# Patient Record
Sex: Female | Born: 1935 | Race: White | Hispanic: No | Smoking: Never smoker
Health system: Southern US, Community
[De-identification: ages and names within clinical notes are randomized; demographics above are authoritative.]

## PROBLEM LIST (undated history)

## (undated) DIAGNOSIS — Z961 Presence of intraocular lens: Secondary | ICD-10-CM

## (undated) DIAGNOSIS — M1990 Unspecified osteoarthritis, unspecified site: Secondary | ICD-10-CM

## (undated) DIAGNOSIS — I517 Cardiomegaly: Secondary | ICD-10-CM

## (undated) DIAGNOSIS — F419 Anxiety disorder, unspecified: Secondary | ICD-10-CM

## (undated) DIAGNOSIS — R079 Chest pain, unspecified: Secondary | ICD-10-CM

## (undated) DIAGNOSIS — I10 Essential (primary) hypertension: Secondary | ICD-10-CM

## (undated) DIAGNOSIS — N393 Stress incontinence (female) (male): Secondary | ICD-10-CM

## (undated) DIAGNOSIS — K2970 Gastritis, unspecified, without bleeding: Secondary | ICD-10-CM

## (undated) DIAGNOSIS — F329 Major depressive disorder, single episode, unspecified: Secondary | ICD-10-CM

## (undated) DIAGNOSIS — K219 Gastro-esophageal reflux disease without esophagitis: Secondary | ICD-10-CM

## (undated) DIAGNOSIS — I351 Nonrheumatic aortic (valve) insufficiency: Secondary | ICD-10-CM

## (undated) DIAGNOSIS — I4891 Unspecified atrial fibrillation: Secondary | ICD-10-CM

## (undated) DIAGNOSIS — E039 Hypothyroidism, unspecified: Secondary | ICD-10-CM

## (undated) DIAGNOSIS — M810 Age-related osteoporosis without current pathological fracture: Secondary | ICD-10-CM

## (undated) DIAGNOSIS — K279 Peptic ulcer, site unspecified, unspecified as acute or chronic, without hemorrhage or perforation: Secondary | ICD-10-CM

## (undated) DIAGNOSIS — Z950 Presence of cardiac pacemaker: Secondary | ICD-10-CM

## (undated) HISTORY — DX: Chest pain, unspecified: R07.9

## (undated) HISTORY — PX: BLADDER SURGERY: SHX569

## (undated) HISTORY — PX: LEG SURGERY: SHX1003

## (undated) HISTORY — PX: DG  BONE DENSITY (ARMC HX): HXRAD1102

## (undated) HISTORY — PX: ABDOMINAL SURGERY: SHX537

## (undated) HISTORY — PX: EYE SURGERY: SHX253

## (undated) HISTORY — DX: Presence of cardiac pacemaker: Z95.0

## (undated) HISTORY — PX: KNEE ARTHROPLASTY: SHX992

---

## 2014-12-15 DIAGNOSIS — D3131 Benign neoplasm of right choroid: Secondary | ICD-10-CM | POA: Diagnosis not present

## 2015-02-24 DIAGNOSIS — M19049 Primary osteoarthritis, unspecified hand: Secondary | ICD-10-CM | POA: Diagnosis not present

## 2015-02-24 DIAGNOSIS — M810 Age-related osteoporosis without current pathological fracture: Secondary | ICD-10-CM | POA: Diagnosis not present

## 2015-02-24 DIAGNOSIS — M179 Osteoarthritis of knee, unspecified: Secondary | ICD-10-CM | POA: Diagnosis not present

## 2015-02-24 DIAGNOSIS — Z791 Long term (current) use of non-steroidal anti-inflammatories (NSAID): Secondary | ICD-10-CM | POA: Diagnosis not present

## 2015-03-10 DIAGNOSIS — N390 Urinary tract infection, site not specified: Secondary | ICD-10-CM | POA: Diagnosis not present

## 2015-03-10 DIAGNOSIS — F419 Anxiety disorder, unspecified: Secondary | ICD-10-CM | POA: Diagnosis not present

## 2015-03-10 DIAGNOSIS — M810 Age-related osteoporosis without current pathological fracture: Secondary | ICD-10-CM | POA: Diagnosis not present

## 2015-03-10 DIAGNOSIS — Z791 Long term (current) use of non-steroidal anti-inflammatories (NSAID): Secondary | ICD-10-CM | POA: Diagnosis not present

## 2015-04-15 DIAGNOSIS — M818 Other osteoporosis without current pathological fracture: Secondary | ICD-10-CM | POA: Diagnosis not present

## 2015-04-16 DIAGNOSIS — N390 Urinary tract infection, site not specified: Secondary | ICD-10-CM | POA: Diagnosis not present

## 2015-06-04 DIAGNOSIS — N390 Urinary tract infection, site not specified: Secondary | ICD-10-CM | POA: Diagnosis not present

## 2015-06-04 DIAGNOSIS — M1990 Unspecified osteoarthritis, unspecified site: Secondary | ICD-10-CM | POA: Diagnosis not present

## 2015-06-04 DIAGNOSIS — N3946 Mixed incontinence: Secondary | ICD-10-CM | POA: Diagnosis not present

## 2015-06-04 DIAGNOSIS — F419 Anxiety disorder, unspecified: Secondary | ICD-10-CM | POA: Diagnosis not present

## 2015-06-11 DIAGNOSIS — H169 Unspecified keratitis: Secondary | ICD-10-CM | POA: Diagnosis not present

## 2015-06-11 DIAGNOSIS — H3561 Retinal hemorrhage, right eye: Secondary | ICD-10-CM | POA: Diagnosis not present

## 2015-06-11 DIAGNOSIS — D3131 Benign neoplasm of right choroid: Secondary | ICD-10-CM | POA: Diagnosis not present

## 2015-06-16 DIAGNOSIS — I10 Essential (primary) hypertension: Secondary | ICD-10-CM | POA: Diagnosis not present

## 2015-06-16 DIAGNOSIS — N3941 Urge incontinence: Secondary | ICD-10-CM | POA: Diagnosis not present

## 2015-06-16 DIAGNOSIS — N281 Cyst of kidney, acquired: Secondary | ICD-10-CM | POA: Diagnosis not present

## 2015-06-16 DIAGNOSIS — Z6830 Body mass index (BMI) 30.0-30.9, adult: Secondary | ICD-10-CM | POA: Diagnosis not present

## 2015-06-16 DIAGNOSIS — M810 Age-related osteoporosis without current pathological fracture: Secondary | ICD-10-CM | POA: Diagnosis not present

## 2015-06-16 DIAGNOSIS — R350 Frequency of micturition: Secondary | ICD-10-CM | POA: Diagnosis not present

## 2015-06-19 DIAGNOSIS — R102 Pelvic and perineal pain: Secondary | ICD-10-CM | POA: Diagnosis not present

## 2015-06-19 DIAGNOSIS — N858 Other specified noninflammatory disorders of uterus: Secondary | ICD-10-CM | POA: Diagnosis not present

## 2015-07-10 DIAGNOSIS — Z6830 Body mass index (BMI) 30.0-30.9, adult: Secondary | ICD-10-CM | POA: Diagnosis not present

## 2015-07-10 DIAGNOSIS — N3941 Urge incontinence: Secondary | ICD-10-CM | POA: Diagnosis not present

## 2015-07-10 DIAGNOSIS — N281 Cyst of kidney, acquired: Secondary | ICD-10-CM | POA: Diagnosis not present

## 2015-07-10 DIAGNOSIS — Z79899 Other long term (current) drug therapy: Secondary | ICD-10-CM | POA: Diagnosis not present

## 2015-07-10 DIAGNOSIS — N390 Urinary tract infection, site not specified: Secondary | ICD-10-CM | POA: Diagnosis not present

## 2015-07-10 DIAGNOSIS — M810 Age-related osteoporosis without current pathological fracture: Secondary | ICD-10-CM | POA: Diagnosis not present

## 2015-07-10 DIAGNOSIS — I10 Essential (primary) hypertension: Secondary | ICD-10-CM | POA: Diagnosis not present

## 2015-07-10 DIAGNOSIS — R350 Frequency of micturition: Secondary | ICD-10-CM | POA: Diagnosis not present

## 2015-07-21 DIAGNOSIS — N3941 Urge incontinence: Secondary | ICD-10-CM | POA: Diagnosis not present

## 2015-07-21 DIAGNOSIS — Z6830 Body mass index (BMI) 30.0-30.9, adult: Secondary | ICD-10-CM | POA: Diagnosis not present

## 2015-08-27 DIAGNOSIS — M179 Osteoarthritis of knee, unspecified: Secondary | ICD-10-CM | POA: Diagnosis not present

## 2015-08-27 DIAGNOSIS — M19049 Primary osteoarthritis, unspecified hand: Secondary | ICD-10-CM | POA: Diagnosis not present

## 2015-08-27 DIAGNOSIS — Z791 Long term (current) use of non-steroidal anti-inflammatories (NSAID): Secondary | ICD-10-CM | POA: Diagnosis not present

## 2015-09-16 DIAGNOSIS — M79672 Pain in left foot: Secondary | ICD-10-CM | POA: Diagnosis not present

## 2015-09-16 DIAGNOSIS — L603 Nail dystrophy: Secondary | ICD-10-CM | POA: Diagnosis not present

## 2015-09-16 DIAGNOSIS — L600 Ingrowing nail: Secondary | ICD-10-CM | POA: Diagnosis not present

## 2015-09-18 DIAGNOSIS — M79672 Pain in left foot: Secondary | ICD-10-CM | POA: Diagnosis not present

## 2015-09-18 DIAGNOSIS — L600 Ingrowing nail: Secondary | ICD-10-CM | POA: Diagnosis not present

## 2015-10-01 DIAGNOSIS — M79609 Pain in unspecified limb: Secondary | ICD-10-CM | POA: Diagnosis not present

## 2015-10-01 DIAGNOSIS — L03031 Cellulitis of right toe: Secondary | ICD-10-CM | POA: Diagnosis not present

## 2015-10-01 DIAGNOSIS — L03032 Cellulitis of left toe: Secondary | ICD-10-CM | POA: Diagnosis not present

## 2015-10-01 DIAGNOSIS — S99922A Unspecified injury of left foot, initial encounter: Secondary | ICD-10-CM | POA: Diagnosis not present

## 2015-10-09 DIAGNOSIS — M1990 Unspecified osteoarthritis, unspecified site: Secondary | ICD-10-CM | POA: Diagnosis not present

## 2015-10-09 DIAGNOSIS — R300 Dysuria: Secondary | ICD-10-CM | POA: Diagnosis not present

## 2015-10-09 DIAGNOSIS — L03119 Cellulitis of unspecified part of limb: Secondary | ICD-10-CM | POA: Diagnosis not present

## 2015-12-02 DIAGNOSIS — N3000 Acute cystitis without hematuria: Secondary | ICD-10-CM | POA: Diagnosis not present

## 2016-01-15 DIAGNOSIS — H1859 Other hereditary corneal dystrophies: Secondary | ICD-10-CM | POA: Diagnosis not present

## 2016-01-15 DIAGNOSIS — T86841 Corneal transplant failure: Secondary | ICD-10-CM | POA: Diagnosis not present

## 2016-01-15 DIAGNOSIS — B0052 Herpesviral keratitis: Secondary | ICD-10-CM | POA: Diagnosis not present

## 2016-01-15 DIAGNOSIS — D3131 Benign neoplasm of right choroid: Secondary | ICD-10-CM | POA: Diagnosis not present

## 2016-01-15 DIAGNOSIS — Z947 Corneal transplant status: Secondary | ICD-10-CM | POA: Diagnosis not present

## 2016-01-19 DIAGNOSIS — R197 Diarrhea, unspecified: Secondary | ICD-10-CM | POA: Diagnosis not present

## 2016-01-19 DIAGNOSIS — N390 Urinary tract infection, site not specified: Secondary | ICD-10-CM | POA: Diagnosis not present

## 2016-01-19 DIAGNOSIS — R112 Nausea with vomiting, unspecified: Secondary | ICD-10-CM | POA: Diagnosis not present

## 2016-02-05 DIAGNOSIS — K5900 Constipation, unspecified: Secondary | ICD-10-CM | POA: Diagnosis not present

## 2016-02-05 DIAGNOSIS — H6123 Impacted cerumen, bilateral: Secondary | ICD-10-CM | POA: Diagnosis not present

## 2016-02-05 DIAGNOSIS — R109 Unspecified abdominal pain: Secondary | ICD-10-CM | POA: Diagnosis not present

## 2016-02-05 DIAGNOSIS — Z1231 Encounter for screening mammogram for malignant neoplasm of breast: Secondary | ICD-10-CM | POA: Diagnosis not present

## 2016-02-05 DIAGNOSIS — M1612 Unilateral primary osteoarthritis, left hip: Secondary | ICD-10-CM | POA: Diagnosis not present

## 2016-02-05 DIAGNOSIS — E039 Hypothyroidism, unspecified: Secondary | ICD-10-CM | POA: Diagnosis not present

## 2016-02-05 DIAGNOSIS — I10 Essential (primary) hypertension: Secondary | ICD-10-CM | POA: Diagnosis not present

## 2016-02-05 DIAGNOSIS — R300 Dysuria: Secondary | ICD-10-CM | POA: Diagnosis not present

## 2016-02-05 DIAGNOSIS — M5136 Other intervertebral disc degeneration, lumbar region: Secondary | ICD-10-CM | POA: Diagnosis not present

## 2016-03-01 DIAGNOSIS — R399 Unspecified symptoms and signs involving the genitourinary system: Secondary | ICD-10-CM | POA: Diagnosis not present

## 2016-03-01 DIAGNOSIS — N3941 Urge incontinence: Secondary | ICD-10-CM | POA: Diagnosis not present

## 2016-03-01 DIAGNOSIS — Z6831 Body mass index (BMI) 31.0-31.9, adult: Secondary | ICD-10-CM | POA: Diagnosis not present

## 2016-03-02 DIAGNOSIS — M1711 Unilateral primary osteoarthritis, right knee: Secondary | ICD-10-CM | POA: Diagnosis not present

## 2016-03-02 DIAGNOSIS — M19049 Primary osteoarthritis, unspecified hand: Secondary | ICD-10-CM | POA: Diagnosis not present

## 2016-03-02 DIAGNOSIS — E559 Vitamin D deficiency, unspecified: Secondary | ICD-10-CM | POA: Diagnosis not present

## 2016-03-02 DIAGNOSIS — Z7983 Long term (current) use of bisphosphonates: Secondary | ICD-10-CM | POA: Diagnosis not present

## 2016-04-08 DIAGNOSIS — N3941 Urge incontinence: Secondary | ICD-10-CM | POA: Diagnosis not present

## 2016-06-20 ENCOUNTER — Encounter (HOSPITAL_COMMUNITY): Payer: Self-pay | Admitting: Emergency Medicine

## 2016-06-20 ENCOUNTER — Ambulatory Visit (HOSPITAL_COMMUNITY)
Admission: EM | Admit: 2016-06-20 | Discharge: 2016-06-20 | Disposition: A | Discharge status: Home / Self Care | Payer: Medicare Other | Attending: Emergency Medicine | Admitting: Emergency Medicine

## 2016-06-20 DIAGNOSIS — R8290 Unspecified abnormal findings in urine: Secondary | ICD-10-CM | POA: Insufficient documentation

## 2016-06-20 DIAGNOSIS — N390 Urinary tract infection, site not specified: Secondary | ICD-10-CM | POA: Diagnosis present

## 2016-06-20 DIAGNOSIS — R300 Dysuria: Secondary | ICD-10-CM | POA: Diagnosis not present

## 2016-06-20 DIAGNOSIS — Z87440 Personal history of urinary (tract) infections: Secondary | ICD-10-CM | POA: Insufficient documentation

## 2016-06-20 DIAGNOSIS — I10 Essential (primary) hypertension: Secondary | ICD-10-CM | POA: Insufficient documentation

## 2016-06-20 DIAGNOSIS — M1990 Unspecified osteoarthritis, unspecified site: Secondary | ICD-10-CM | POA: Diagnosis not present

## 2016-06-20 DIAGNOSIS — Z79899 Other long term (current) drug therapy: Secondary | ICD-10-CM | POA: Diagnosis not present

## 2016-06-20 DIAGNOSIS — R3915 Urgency of urination: Secondary | ICD-10-CM | POA: Insufficient documentation

## 2016-06-20 HISTORY — DX: Presence of intraocular lens: Z96.1

## 2016-06-20 HISTORY — DX: Unspecified osteoarthritis, unspecified site: M19.90

## 2016-06-20 HISTORY — DX: Age-related osteoporosis without current pathological fracture: M81.0

## 2016-06-20 HISTORY — DX: Hypothyroidism, unspecified: E03.9

## 2016-06-20 HISTORY — DX: Gastro-esophageal reflux disease without esophagitis: K21.9

## 2016-06-20 HISTORY — DX: Stress incontinence (female) (male): N39.3

## 2016-06-20 HISTORY — DX: Major depressive disorder, single episode, unspecified: F32.9

## 2016-06-20 HISTORY — DX: Peptic ulcer, site unspecified, unspecified as acute or chronic, without hemorrhage or perforation: K27.9

## 2016-06-20 HISTORY — DX: Gastritis, unspecified, without bleeding: K29.70

## 2016-06-20 HISTORY — DX: Essential (primary) hypertension: I10

## 2016-06-20 HISTORY — DX: Cardiomegaly: I51.7

## 2016-06-20 HISTORY — DX: Nonrheumatic aortic (valve) insufficiency: I35.1

## 2016-06-20 HISTORY — DX: Anxiety disorder, unspecified: F41.9

## 2016-06-20 LAB — POCT URINALYSIS DIP (DEVICE)
BILIRUBIN URINE: NEGATIVE
Glucose, UA: NEGATIVE mg/dL
HGB URINE DIPSTICK: NEGATIVE
KETONES UR: NEGATIVE mg/dL
NITRITE: NEGATIVE
PH: 7 (ref 5.0–8.0)
PROTEIN: NEGATIVE mg/dL
Specific Gravity, Urine: 1.01 (ref 1.005–1.030)
Urobilinogen, UA: 0.2 mg/dL (ref 0.0–1.0)

## 2016-06-20 MED ORDER — CEFDINIR 300 MG PO CAPS: 300.0000 mg | ORAL_CAPSULE | Freq: Two times a day (BID) | ORAL | Status: DC

## 2016-06-20 NOTE — ED Notes (Addendum)
uti concerns, [facility]  Reports frequent urination, odor, color change to urine.  History of the same . Patient has many allergies and reports her utis are usually treated with cefdinir

## 2016-06-20 NOTE — ED Provider Notes (Signed)
CSN: [id number]     Arrival date & time 06/20/16  [id number] History   First MD Initiated Contact with Patient 06/20/16 1056     Chief Complaint  Patient presents with  . Urinary Tract Infection   (Consider location/radiation/quality/duration/timing/severity/associated sxs/prior Treatment) HPI Comments: 82 year old female presents to the urgent care with concerns of UTI. Her symptoms are malodorous urine and urinary frequency. Denies dysuria. Review systems otherwise negative. She is concerned because she states that she has UTIs approximately every 6 weeks or so she is allergic to several antibiotics and is only able to take Cefdinir. Denies fever, chills, back pain, GI symptoms.   Past Medical History  Diagnosis Date  . Hypertension   . Osteoarthritis   . Osteoporosis   . Major depression ([place])   . Anxiety   . GERD (gastroesophageal reflux disease)   . Left ventricular hypertrophy   . Aortic regurgitation   . Stress incontinence   . Hypothyroid   . Gastritis   . Peptic ulcer disease   . H/O artificial eye lens    History reviewed. No pertinent past surgical history. No family history on file. Social History  Substance Use Topics  . Smoking status: Never Smoker   . Smokeless tobacco: None  . Alcohol Use: No   OB History    No data available     Review of Systems  Constitutional: Negative.   HENT: Negative.   Respiratory: Negative.   Genitourinary: Positive for urgency and frequency. Negative for dysuria, vaginal bleeding and difficulty urinating.  Neurological: Negative.     Allergies  Ciprofloxacin; Levofloxacin; Macrobid; Nitrofurantoin; and Sulfa antibiotics  Home Medications   Prior to Admission medications   Medication Sig Start Date End Date Taking? Authorizing Provider  acyclovir (ZOVIRAX) 800 MG tablet Take 800 mg by mouth 5 (five) times daily.   Yes Historical Provider, MD  ALPRAZolam Duanne Moron) 0.5 MG tablet Take 0.5 mg by mouth 3 (three) times daily as  needed for anxiety.   Yes Historical Provider, MD  atenolol (TENORMIN) 25 MG tablet Take by mouth daily.   Yes Historical Provider, MD  Calcium Carbonate-Vit D-Min (CALCIUM 1200 PO) Take by mouth.   Yes Historical Provider, MD  celecoxib (CELEBREX) 200 MG capsule Take 200 mg by mouth daily.   Yes Historical Provider, MD  Cholecalciferol (VITAMIN D-3 PO) Take by mouth.   Yes Historical Provider, MD  FLUoxetine (PROZAC) 20 MG tablet Take 20 mg by mouth daily.   Yes Historical Provider, MD  HYDROcodone-acetaminophen (NORCO/VICODIN) 5-325 MG tablet Take 1 tablet by mouth every 6 (six) hours as needed for moderate pain.   Yes Historical Provider, MD  levothyroxine (SYNTHROID, LEVOTHROID) 112 MCG tablet Take 112 mcg by mouth daily before breakfast.   Yes Historical Provider, MD  LOSARTAN POTASSIUM PO Take 100 mg by mouth once.   Yes Historical Provider, MD  Omega-3 Fatty Acids (FISH OIL PO) Take by mouth.   Yes Historical Provider, MD  omeprazole (PRILOSEC) 40 MG capsule Take 40 mg by mouth daily.   Yes Historical Provider, MD  simvastatin (ZOCOR) 20 MG tablet Take 20 mg by mouth daily.   Yes Historical Provider, MD  traMADol (ULTRAM) 50 MG tablet Take by mouth every 6 (six) hours as needed.   Yes Historical Provider, MD  cefdinir (OMNICEF) 300 MG capsule Take 1 capsule (300 mg total) by mouth 2 (two) times daily. 06/20/16   Janne Napoleon, NP   Meds Ordered and Administered this Visit  Medications - No  data to display  BP 136/79 mmHg  Pulse 88  Temp(Src) 97.5 F (36.4 C) (Oral)  Ht 5\' 9"  (1.753 m)  Wt 190 lb (86.183 kg)  BMI 28.05 kg/m2  SpO2 99% No data found.   Physical Exam  Constitutional: She is oriented to person, place, and time. She appears well-developed and well-nourished. No distress.  Eyes: EOM are normal.  Neck: Normal range of motion. Neck supple.  Cardiovascular: Normal rate.   Pulmonary/Chest: Effort normal. No respiratory distress.  Musculoskeletal: She exhibits no edema.   Neurological: She is alert and oriented to person, place, and time. She exhibits normal muscle tone.  Skin: Skin is warm and dry.  Psychiatric: She has a normal mood and affect.  Nursing note and vitals reviewed.   ED Course  Procedures (including critical care time)  Labs Review Labs Reviewed  POCT URINALYSIS DIP (DEVICE) - Abnormal; Notable for the following:    Leukocytes, UA TRACE (*)    All other components within normal limits  URINE CULTURE    Imaging Review No results found.   Visual Acuity Review  Right Eye Distance:   Left Eye Distance:   Bilateral Distance:    Right Eye Near:   Left Eye Near:    Bilateral Near:         MDM   1. Dysuria   2. Urinary urgency   3. Abnormal urine odor   4. History of recurrent UTI (urinary tract infection)    Drink plenty of fluids and stay well-hydrated Take medication as prescribed. Urine culture pending. Meds ordered this encounter  Medications  . omeprazole (PRILOSEC) 40 MG capsule    Sig: Take 40 mg by mouth daily.  . traMADol (ULTRAM) 50 MG tablet    Sig: Take by mouth every 6 (six) hours as needed.  . Calcium Carbonate-Vit D-Min (CALCIUM 1200 PO)    Sig: Take by mouth.  . Cholecalciferol (VITAMIN D-3 PO)    Sig: Take by mouth.  . Omega-3 Fatty Acids (FISH OIL PO)    Sig: Take by mouth.  . ALPRAZolam (XANAX) 0.5 MG tablet    Sig: Take 0.5 mg by mouth 3 (three) times daily as needed for anxiety.  Marland Kitchen acyclovir (ZOVIRAX) 800 MG tablet    Sig: Take 800 mg by mouth 5 (five) times daily.  . celecoxib (CELEBREX) 200 MG capsule    Sig: Take 200 mg by mouth daily.  . simvastatin (ZOCOR) 20 MG tablet    Sig: Take 20 mg by mouth daily.  Marland Kitchen LOSARTAN POTASSIUM PO    Sig: Take 100 mg by mouth once.  Marland Kitchen atenolol (TENORMIN) 25 MG tablet    Sig: Take by mouth daily.  Marland Kitchen levothyroxine (SYNTHROID, LEVOTHROID) 112 MCG tablet    Sig: Take 112 mcg by mouth daily before breakfast.  . HYDROcodone-acetaminophen (NORCO/VICODIN)  5-325 MG tablet    Sig: Take 1 tablet by mouth every 6 (six) hours as needed for moderate pain.  Marland Kitchen FLUoxetine (PROZAC) 20 MG tablet    Sig: Take 20 mg by mouth daily.  . cefdinir (OMNICEF) 300 MG capsule    Sig: Take 1 capsule (300 mg total) by mouth 2 (two) times daily.    Dispense:  16 capsule    Refill:  0    Order Specific Question:  Supervising Provider    Answer:  Melony Overly [id number]       Janne Napoleon, NP 06/20/16 1128

## 2016-06-20 NOTE — Discharge Instructions (Signed)

## 2016-06-21 LAB — URINE CULTURE
Culture: 6000 — AB
Special Requests: NORMAL

## 2016-06-30 ENCOUNTER — Ambulatory Visit (HOSPITAL_COMMUNITY)
Admission: EM | Admit: 2016-06-30 | Discharge: 2016-06-30 | Disposition: A | Discharge status: Home / Self Care | Payer: Medicare Other | Attending: Family Medicine | Admitting: Family Medicine

## 2016-06-30 ENCOUNTER — Encounter (HOSPITAL_COMMUNITY): Payer: Self-pay | Admitting: Nurse Practitioner

## 2016-06-30 DIAGNOSIS — R3915 Urgency of urination: Secondary | ICD-10-CM | POA: Diagnosis not present

## 2016-06-30 DIAGNOSIS — R8290 Unspecified abnormal findings in urine: Secondary | ICD-10-CM

## 2016-06-30 DIAGNOSIS — R350 Frequency of micturition: Secondary | ICD-10-CM | POA: Diagnosis not present

## 2016-06-30 DIAGNOSIS — Z79899 Other long term (current) drug therapy: Secondary | ICD-10-CM | POA: Insufficient documentation

## 2016-06-30 LAB — POCT URINALYSIS DIP (DEVICE)
BILIRUBIN URINE: NEGATIVE
Glucose, UA: NEGATIVE mg/dL
HGB URINE DIPSTICK: NEGATIVE
KETONES UR: NEGATIVE mg/dL
Leukocytes, UA: NEGATIVE
Nitrite: NEGATIVE
PH: 6.5 (ref 5.0–8.0)
PROTEIN: NEGATIVE mg/dL
Specific Gravity, Urine: 1.01 (ref 1.005–1.030)
Urobilinogen, UA: 0.2 mg/dL (ref 0.0–1.0)

## 2016-06-30 MED ORDER — OXYBUTYNIN CHLORIDE 5 MG PO TABS: 5.0000 mg | ORAL_TABLET | Freq: Three times a day (TID) | ORAL | 0 refills | Status: DC | PRN

## 2016-06-30 NOTE — Discharge Instructions (Signed)
Your symptoms are likely due to pelvic floor dysfunction and bladder spasm. Please use the oxybutynin to help with the symptoms. Please stay well-hydrated. We will culture your urine sample today though initially there are no signs of infection. Please call [facility] for a follow-up appointment. Please also find a primary care physician who can continue caring for your other medical conditions including your feelings of fatigue and lethargy. In the interim you may consider going to [place] [facility]

## 2016-06-30 NOTE — ED Triage Notes (Signed)
She c/o 10 day history of urinary frequency and UTI symptoms. She reports a history of UTI and was just treated with 6 day course of abx with no relief of urinary frequency. She woke today with darker urine and reports malaise this week. She is alert, breathing easily

## 2016-06-30 NOTE — ED Provider Notes (Signed)
[facility]    CSN: [id number] Arrival date & time: 06/30/16  1018  First Provider Contact:  None       History   Chief Complaint Chief Complaint  Patient presents with  . Urinary Frequency    HPI Faith Parker is a 82 y.o. female.   HPI  Frequency and dark urine and feeling tired.  Minimal relief after recent treatment with antibiotics. Persistent lethargy which is been ongoing for months. Denies fevers, dysuria, suprapubic abdominal pain, rash, CVA tenderness Any unintentional weight loss.  Patient had 4 vaginal births.  Just moved to the area from [place].   Has had botox injection 2 mo ago. By her urologist in [place].     Past Medical History:  Diagnosis Date  . Anxiety   . Aortic regurgitation   . Gastritis   . GERD (gastroesophageal reflux disease)   . H/O artificial eye lens   . Hypertension   . Hypothyroid   . Left ventricular hypertrophy   . Major depression ([place])   . Osteoarthritis   . Osteoporosis   . Peptic ulcer disease   . Stress incontinence     There are no active problems to display for this patient.   History reviewed. No pertinent surgical history.  OB History    No data available       Home Medications    Prior to Admission medications   Medication Sig Start Date End Date Taking? Authorizing Provider  acyclovir (ZOVIRAX) 800 MG tablet Take 800 mg by mouth 5 (five) times daily.    Historical Provider, MD  ALPRAZolam Duanne Moron) 0.5 MG tablet Take 0.5 mg by mouth 3 (three) times daily as needed for anxiety.    Historical Provider, MD  atenolol (TENORMIN) 25 MG tablet Take by mouth daily.    Historical Provider, MD  Calcium Carbonate-Vit D-Min (CALCIUM 1200 PO) Take by mouth.    Historical Provider, MD  cefdinir (OMNICEF) 300 MG capsule Take 1 capsule (300 mg total) by mouth 2 (two) times daily. 06/20/16   Janne Napoleon, NP  celecoxib (CELEBREX) 200 MG capsule Take 200 mg by mouth daily.    Historical Provider, MD    Cholecalciferol (VITAMIN D-3 PO) Take by mouth.    Historical Provider, MD  FLUoxetine (PROZAC) 20 MG tablet Take 20 mg by mouth daily.    Historical Provider, MD  HYDROcodone-acetaminophen (NORCO/VICODIN) 5-325 MG tablet Take 1 tablet by mouth every 6 (six) hours as needed for moderate pain.    Historical Provider, MD  levothyroxine (SYNTHROID, LEVOTHROID) 112 MCG tablet Take 112 mcg by mouth daily before breakfast.    Historical Provider, MD  LOSARTAN POTASSIUM PO Take 100 mg by mouth once.    Historical Provider, MD  Omega-3 Fatty Acids (FISH OIL PO) Take by mouth.    Historical Provider, MD  omeprazole (PRILOSEC) 40 MG capsule Take 40 mg by mouth daily.    Historical Provider, MD  simvastatin (ZOCOR) 20 MG tablet Take 20 mg by mouth daily.    Historical Provider, MD  traMADol (ULTRAM) 50 MG tablet Take by mouth every 6 (six) hours as needed.    Historical Provider, MD    Family History History reviewed. No pertinent family history.  Social History Social History  Substance Use Topics  . Smoking status: Never Smoker  . Smokeless tobacco: Never Used  . Alcohol use No     Allergies   Ciprofloxacin; Latex; Levofloxacin; Macrobid [nitrofurantoin monohyd macro]; Nitrofurantoin; and Sulfa antibiotics  Review of Systems Review of Systems  Per HPI with all other pertinent systems negative.   Physical Exam Triage Vital Signs ED Triage Vitals  Enc Vitals Group     BP 06/30/16 1145 165/90     Pulse Rate 06/30/16 1145 84     Resp 06/30/16 1145 18     Temp 06/30/16 1145 98.2 F (36.8 C)     Temp Source 06/30/16 1145 Oral     SpO2 06/30/16 1145 98 %     Weight --      Height --      Head Circumference --      Peak Flow --      Pain Score 06/30/16 1150 5     Pain Loc --      Pain Edu? --      Excl. in [place]? --    No data found.   Updated Vital Signs BP 165/90   Pulse 84   Temp 98.2 F (36.8 C) (Oral)   Resp 18   SpO2 98%   Visual Acuity Right Eye Distance:   Left  Eye Distance:   Bilateral Distance:    Right Eye Near:   Left Eye Near:    Bilateral Near:     Physical Exam Physical Exam  Constitutional: oriented to person, place, and time. appears well-developed and well-nourished. No distress.  HENT:  Head: Normocephalic and atraumatic.  Eyes: EOMI. PERRL.  Neck: Normal range of motion.  Pulmonary/Chest: Effort normal No respiratory distress.  Abdominal: Soft.  no distension.  Musculoskeletal: Normal range of motion. Non ttp, no effusion.  Neurological: alert and oriented to person, place, and time.  Skin: Skin is warm. No rash noted. non diaphoretic.  Psychiatric: normal mood and affect. behavior is normal. Judgment and thought content normal.    UC Treatments / Results  Labs (all labs ordered are listed, but only abnormal results are displayed) Labs Reviewed  POCT URINALYSIS DIP (DEVICE)    EKG  EKG Interpretation None       Radiology No results found.  Procedures Procedures (including critical care time)  Medications Ordered in UC Medications - No data to display   Initial Impression / Assessment and Plan / UC Course  I have reviewed the triage vital signs and the nursing notes.  Pertinent labs & imaging results that were available during my care of the patient were reviewed by me and considered in my medical decision making (see chart for details).  Clinical Course  Patient likely with pelvic floor dysfunction and bladder spasm causing symptoms. No evidence of UTI based on recent UA and urine culture with less than 6000 colonies. No significant change with recent antibodies. Reticent to start antibiotics at this point time. Of note patient used to follow up with urology regularly in [facility] for which he had Botox injections for pelvic floor dysfunction. I suspect that this is enlarged part the cause of her problems at this point in time along with bladder spasm. Will start patient on oxybutynin. Detailed instructions was  wrist benefits of medication discussed. Patient to call [facility] for follow-up. The patient's symptoms could worsen his go to the emergency room. We will send urinalysis for urine culture.  Final Clinical Impressions(s) / UC Diagnoses   Final diagnoses:  None    New Prescriptions New Prescriptions   No medications on file     Waldemar Dickens, MD 06/30/16 1242

## 2016-07-01 LAB — URINE CULTURE

## 2016-08-04 DIAGNOSIS — N3000 Acute cystitis without hematuria: Secondary | ICD-10-CM | POA: Diagnosis not present

## 2016-08-04 DIAGNOSIS — M150 Primary generalized (osteo)arthritis: Secondary | ICD-10-CM | POA: Diagnosis not present

## 2016-08-04 DIAGNOSIS — L270 Generalized skin eruption due to drugs and medicaments taken internally: Secondary | ICD-10-CM | POA: Diagnosis not present

## 2016-08-16 DIAGNOSIS — I10 Essential (primary) hypertension: Secondary | ICD-10-CM | POA: Diagnosis not present

## 2016-08-16 DIAGNOSIS — F339 Major depressive disorder, recurrent, unspecified: Secondary | ICD-10-CM | POA: Diagnosis not present

## 2016-08-16 DIAGNOSIS — N390 Urinary tract infection, site not specified: Secondary | ICD-10-CM | POA: Diagnosis not present

## 2016-08-16 DIAGNOSIS — E559 Vitamin D deficiency, unspecified: Secondary | ICD-10-CM | POA: Diagnosis not present

## 2016-08-16 DIAGNOSIS — H5462 Unqualified visual loss, left eye, normal vision right eye: Secondary | ICD-10-CM | POA: Diagnosis not present

## 2016-08-16 DIAGNOSIS — K219 Gastro-esophageal reflux disease without esophagitis: Secondary | ICD-10-CM | POA: Diagnosis not present

## 2016-08-16 DIAGNOSIS — F419 Anxiety disorder, unspecified: Secondary | ICD-10-CM | POA: Diagnosis not present

## 2016-08-16 DIAGNOSIS — M1990 Unspecified osteoarthritis, unspecified site: Secondary | ICD-10-CM | POA: Diagnosis not present

## 2016-08-31 DIAGNOSIS — M533 Sacrococcygeal disorders, not elsewhere classified: Secondary | ICD-10-CM | POA: Diagnosis not present

## 2016-09-15 DIAGNOSIS — D3131 Benign neoplasm of right choroid: Secondary | ICD-10-CM | POA: Diagnosis not present

## 2016-09-18 DIAGNOSIS — N3090 Cystitis, unspecified without hematuria: Secondary | ICD-10-CM | POA: Diagnosis not present

## 2016-09-22 DIAGNOSIS — M25561 Pain in right knee: Secondary | ICD-10-CM | POA: Diagnosis not present

## 2016-09-22 DIAGNOSIS — M5136 Other intervertebral disc degeneration, lumbar region: Secondary | ICD-10-CM | POA: Diagnosis not present

## 2016-09-22 DIAGNOSIS — E559 Vitamin D deficiency, unspecified: Secondary | ICD-10-CM | POA: Diagnosis not present

## 2016-09-22 DIAGNOSIS — M150 Primary generalized (osteo)arthritis: Secondary | ICD-10-CM | POA: Diagnosis not present

## 2016-09-22 DIAGNOSIS — M810 Age-related osteoporosis without current pathological fracture: Secondary | ICD-10-CM | POA: Diagnosis not present

## 2016-09-22 DIAGNOSIS — Z79899 Other long term (current) drug therapy: Secondary | ICD-10-CM | POA: Diagnosis not present

## 2016-09-23 ENCOUNTER — Other Ambulatory Visit: Payer: Self-pay | Admitting: Internal Medicine

## 2016-09-23 DIAGNOSIS — M810 Age-related osteoporosis without current pathological fracture: Secondary | ICD-10-CM

## 2016-09-30 DIAGNOSIS — M810 Age-related osteoporosis without current pathological fracture: Secondary | ICD-10-CM | POA: Diagnosis not present

## 2016-09-30 DIAGNOSIS — R350 Frequency of micturition: Secondary | ICD-10-CM | POA: Diagnosis not present

## 2016-09-30 DIAGNOSIS — N3946 Mixed incontinence: Secondary | ICD-10-CM | POA: Diagnosis not present

## 2016-09-30 DIAGNOSIS — N3944 Nocturnal enuresis: Secondary | ICD-10-CM | POA: Diagnosis not present

## 2016-09-30 DIAGNOSIS — R351 Nocturia: Secondary | ICD-10-CM | POA: Diagnosis not present

## 2016-10-26 DIAGNOSIS — B952 Enterococcus as the cause of diseases classified elsewhere: Secondary | ICD-10-CM | POA: Diagnosis not present

## 2016-10-26 DIAGNOSIS — N3944 Nocturnal enuresis: Secondary | ICD-10-CM | POA: Diagnosis not present

## 2016-10-26 DIAGNOSIS — N3946 Mixed incontinence: Secondary | ICD-10-CM | POA: Diagnosis not present

## 2016-10-26 DIAGNOSIS — N390 Urinary tract infection, site not specified: Secondary | ICD-10-CM | POA: Diagnosis not present

## 2016-11-13 DIAGNOSIS — M26629 Arthralgia of temporomandibular joint, unspecified side: Secondary | ICD-10-CM | POA: Diagnosis not present

## 2016-11-16 DIAGNOSIS — F419 Anxiety disorder, unspecified: Secondary | ICD-10-CM | POA: Diagnosis not present

## 2016-12-09 DIAGNOSIS — N3946 Mixed incontinence: Secondary | ICD-10-CM | POA: Diagnosis not present

## 2016-12-09 DIAGNOSIS — N390 Urinary tract infection, site not specified: Secondary | ICD-10-CM | POA: Diagnosis not present

## 2017-01-21 ENCOUNTER — Emergency Department (HOSPITAL_COMMUNITY): Payer: Medicare Other

## 2017-01-21 ENCOUNTER — Encounter (HOSPITAL_COMMUNITY): Payer: Self-pay

## 2017-01-21 ENCOUNTER — Inpatient Hospital Stay (HOSPITAL_COMMUNITY)
Admission: EM | Admit: 2017-01-21 | Discharge: 2017-01-23 | DRG: 312 | Disposition: A | Discharge status: Home / Self Care | Payer: Medicare Other | Attending: Internal Medicine | Admitting: Internal Medicine

## 2017-01-21 DIAGNOSIS — R2681 Unsteadiness on feet: Secondary | ICD-10-CM

## 2017-01-21 DIAGNOSIS — I351 Nonrheumatic aortic (valve) insufficiency: Secondary | ICD-10-CM | POA: Diagnosis not present

## 2017-01-21 DIAGNOSIS — I712 Thoracic aortic aneurysm, without rupture, unspecified: Secondary | ICD-10-CM

## 2017-01-21 DIAGNOSIS — Z79899 Other long term (current) drug therapy: Secondary | ICD-10-CM

## 2017-01-21 DIAGNOSIS — R55 Syncope and collapse: Secondary | ICD-10-CM | POA: Diagnosis not present

## 2017-01-21 DIAGNOSIS — Z888 Allergy status to other drugs, medicaments and biological substances status: Secondary | ICD-10-CM

## 2017-01-21 DIAGNOSIS — I4891 Unspecified atrial fibrillation: Secondary | ICD-10-CM | POA: Diagnosis present

## 2017-01-21 DIAGNOSIS — I352 Nonrheumatic aortic (valve) stenosis with insufficiency: Secondary | ICD-10-CM | POA: Diagnosis present

## 2017-01-21 DIAGNOSIS — Z8711 Personal history of peptic ulcer disease: Secondary | ICD-10-CM

## 2017-01-21 DIAGNOSIS — E890 Postprocedural hypothyroidism: Secondary | ICD-10-CM | POA: Diagnosis present

## 2017-01-21 DIAGNOSIS — E039 Hypothyroidism, unspecified: Secondary | ICD-10-CM

## 2017-01-21 DIAGNOSIS — I10 Essential (primary) hypertension: Secondary | ICD-10-CM | POA: Diagnosis not present

## 2017-01-21 DIAGNOSIS — Z91040 Latex allergy status: Secondary | ICD-10-CM

## 2017-01-21 DIAGNOSIS — R011 Cardiac murmur, unspecified: Secondary | ICD-10-CM | POA: Diagnosis present

## 2017-01-21 DIAGNOSIS — R42 Dizziness and giddiness: Secondary | ICD-10-CM | POA: Diagnosis not present

## 2017-01-21 DIAGNOSIS — Z882 Allergy status to sulfonamides status: Secondary | ICD-10-CM

## 2017-01-21 DIAGNOSIS — K219 Gastro-esophageal reflux disease without esophagitis: Secondary | ICD-10-CM | POA: Diagnosis present

## 2017-01-21 DIAGNOSIS — R51 Headache: Secondary | ICD-10-CM | POA: Diagnosis not present

## 2017-01-21 DIAGNOSIS — R404 Transient alteration of awareness: Secondary | ICD-10-CM | POA: Diagnosis not present

## 2017-01-21 DIAGNOSIS — M1990 Unspecified osteoarthritis, unspecified site: Secondary | ICD-10-CM | POA: Diagnosis present

## 2017-01-21 DIAGNOSIS — M810 Age-related osteoporosis without current pathological fracture: Secondary | ICD-10-CM | POA: Diagnosis present

## 2017-01-21 LAB — TROPONIN I
Troponin I: <0.03 ng/mL (ref <0.03)
Troponin I: <0.03 ng/mL (ref <0.03)

## 2017-01-21 LAB — CBC WITH DIFFERENTIAL/PLATELET
Basophils Absolute: 0 10*3/uL (ref 0.0–0.1)
Basophils Relative: 1 %
EOS PCT: 2 %
Eosinophils Absolute: 0.1 10*3/uL (ref 0.0–0.7)
HCT: 40 % (ref 36.0–46.0)
Hemoglobin: 13.1 g/dL (ref 12.0–15.0)
LYMPHS PCT: 18 %
Lymphs Abs: 1 10*3/uL (ref 0.7–4.0)
MCH: 30.5 pg (ref 26.0–34.0)
MCHC: 32.8 g/dL (ref 30.0–36.0)
MCV: 93.2 fL (ref 78.0–100.0)
MONOS PCT: 7 %
Monocytes Absolute: 0.4 10*3/uL (ref 0.1–1.0)
Neutro Abs: 3.9 10*3/uL (ref 1.7–7.7)
Neutrophils Relative %: 72 %
PLATELETS: 178 10*3/uL (ref 150–400)
RBC: 4.29 MIL/uL (ref 3.87–5.11)
RDW: 14 % (ref 11.5–15.5)
WBC: 5.3 10*3/uL (ref 4.0–10.5)

## 2017-01-21 LAB — TSH: TSH: 0.38 u[IU]/mL (ref 0.350–4.500)

## 2017-01-21 LAB — CBC
HCT: 36.6 % (ref 36.0–46.0)
Hemoglobin: 12.3 g/dL (ref 12.0–15.0)
MCH: 31.1 pg (ref 26.0–34.0)
MCHC: 33.6 g/dL (ref 30.0–36.0)
MCV: 92.7 fL (ref 78.0–100.0)
PLATELETS: 168 10*3/uL (ref 150–400)
RBC: 3.95 MIL/uL (ref 3.87–5.11)
RDW: 13.8 % (ref 11.5–15.5)
WBC: 4.6 10*3/uL (ref 4.0–10.5)

## 2017-01-21 LAB — COMPREHENSIVE METABOLIC PANEL
ALT: 15 U/L (ref 14–54)
AST: 25 U/L (ref 15–41)
Albumin: 3.9 g/dL (ref 3.5–5.0)
Alkaline Phosphatase: 44 U/L (ref 38–126)
Anion gap: 11 (ref 5–15)
BUN: 19 mg/dL (ref 6–20)
CHLORIDE: 102 mmol/L (ref 101–111)
CO2: 25 mmol/L (ref 22–32)
CREATININE: 0.97 mg/dL (ref 0.44–1.00)
Calcium: 9.3 mg/dL (ref 8.9–10.3)
GFR, EST NON AFRICAN AMERICAN: 52 mL/min — AB (ref >60)
Glucose, Bld: 93 mg/dL (ref 65–99)
Potassium: 4.6 mmol/L (ref 3.5–5.1)
Sodium: 138 mmol/L (ref 135–145)
TOTAL PROTEIN: 6.4 g/dL — AB (ref 6.5–8.1)
Total Bilirubin: 0.9 mg/dL (ref 0.3–1.2)

## 2017-01-21 LAB — CREATININE, SERUM
CREATININE: 0.82 mg/dL (ref 0.44–1.00)
GFR calc Af Amer: >60 mL/min (ref >60)
GFR calc non Af Amer: >60 mL/min (ref >60)

## 2017-01-21 LAB — VITAMIN B12: VITAMIN B 12: 615 pg/mL (ref 180–914)

## 2017-01-21 LAB — I-STAT TROPONIN, ED: Troponin i, poc: 0.01 ng/mL (ref 0.00–0.08)

## 2017-01-21 LAB — D-DIMER, QUANTITATIVE: D-Dimer, Quant: 0.58 ug/mL-FEU — ABNORMAL HIGH (ref 0.00–0.50)

## 2017-01-21 LAB — MAGNESIUM: MAGNESIUM: 1.9 mg/dL (ref 1.7–2.4)

## 2017-01-21 MED ORDER — LOSARTAN POTASSIUM 50 MG PO TABS
100.0000 mg | ORAL_TABLET | Freq: Every day | ORAL | Status: DC
  Administered 2017-01-22 – 2017-01-23 (×2): 100 mg via ORAL
  Filled 2017-01-21 (×2): qty 2

## 2017-01-21 MED ORDER — SODIUM CHLORIDE 0.9 % IV SOLN
INTRAVENOUS | Status: DC
  Administered 2017-01-21 – 2017-01-23 (×2): via INTRAVENOUS

## 2017-01-21 MED ORDER — FLUOXETINE HCL 20 MG PO TABS
20.0000 mg | ORAL_TABLET | Freq: Every day | ORAL | Status: DC
  Filled 2017-01-21: qty 1

## 2017-01-21 MED ORDER — HEPARIN SODIUM (PORCINE) 5000 UNIT/ML IJ SOLN
5000.0000 [IU] | Freq: Three times a day (TID) | INTRAMUSCULAR | Status: DC
  Administered 2017-01-21 – 2017-01-22 (×3): 5000 [IU] via SUBCUTANEOUS
  Filled 2017-01-21 (×3): qty 1

## 2017-01-21 MED ORDER — ATENOLOL 50 MG PO TABS
25.0000 mg | ORAL_TABLET | Freq: Two times a day (BID) | ORAL | Status: DC
  Administered 2017-01-21 – 2017-01-23 (×4): 25 mg via ORAL
  Filled 2017-01-21 (×4): qty 1

## 2017-01-21 MED ORDER — ASPIRIN 325 MG PO TABS
325.0000 mg | ORAL_TABLET | Freq: Once | ORAL | Status: AC
  Administered 2017-01-21: 325 mg via ORAL
  Filled 2017-01-21: qty 1

## 2017-01-21 MED ORDER — TRAMADOL HCL 50 MG PO TABS: 50.0000 mg | ORAL_TABLET | Freq: Four times a day (QID) | ORAL | Status: DC | PRN

## 2017-01-21 MED ORDER — LEVOTHYROXINE SODIUM 112 MCG PO TABS
112.0000 ug | ORAL_TABLET | Freq: Every day | ORAL | Status: DC
  Administered 2017-01-22 – 2017-01-23 (×2): 112 ug via ORAL
  Filled 2017-01-21 (×2): qty 1

## 2017-01-21 MED ORDER — SODIUM CHLORIDE 0.9% FLUSH
3.0000 mL | Freq: Two times a day (BID) | INTRAVENOUS | Status: DC
  Administered 2017-01-22 – 2017-01-23 (×2): 3 mL via INTRAVENOUS

## 2017-01-21 MED ORDER — ALPRAZOLAM 0.5 MG PO TABS: 0.5000 mg | ORAL_TABLET | Freq: Three times a day (TID) | ORAL | Status: DC | PRN

## 2017-01-21 NOTE — ED Provider Notes (Signed)
[facility] DEPT Provider Note   CSN: [id number] Arrival date & time: 01/21/17  1315     History   Chief Complaint Chief Complaint  Patient presents with  . Near Syncope  . Atrial Fibrillation    HPI Faith Parker is a 83 y.o. female.  HPI   83 yo F with PMHx as below Who presents with syncopal episode. Patient states that earlier today, she was walking to the elevator to return to her room when she began to experience a dull, aching, substernal chest pressure followed by lightheadedness. She began to "black out" but someone at the facility caught her and lowered her to the ground. She is unsure whether she actually loss consciousness. She has never had similar symptoms. She denies any associated palpitations or shortness of breath. Of note, she did wake up with a moderate, dull, aching, generalized headache this morning. No history of syncopal episodes. She had not recently stood or change positions. No other medical complaint.  Past Medical History:  Diagnosis Date  . Anxiety   . Aortic regurgitation   . Gastritis   . GERD (gastroesophageal reflux disease)   . H/O artificial eye lens   . Hypertension   . Hypothyroid   . Left ventricular hypertrophy   . Major depression   . Osteoarthritis   . Osteoporosis   . Peptic ulcer disease   . Stress incontinence     Patient Active Problem List   Diagnosis Date Noted  . Syncope 01/21/2017  . Hypertension 01/21/2017  . Hypothyroidism 01/21/2017    History reviewed. No pertinent surgical history.  OB History    No data available       Home Medications    Prior to Admission medications   Medication Sig Start Date End Date Taking? Authorizing Provider  ALPRAZolam Duanne Moron) 0.5 MG tablet Take 0.5 mg by mouth 3 (three) times daily as needed for anxiety.   Yes Historical Provider, MD  Ascorbic Acid (VITAMIN C PO) Take 1 tablet by mouth daily.   Yes Historical Provider, MD  atenolol (TENORMIN) 25 MG tablet Take 25 mg by  mouth 2 (two) times daily.    Yes Historical Provider, MD  cephALEXin (KEFLEX) 250 MG capsule Take 250 mg by mouth daily.   Yes Historical Provider, MD  Cyanocobalamin (VITAMIN B-12 PO) Take 1 tablet by mouth daily.   Yes Historical Provider, MD  diclofenac sodium (VOLTAREN) 1 % GEL Apply 1 application topically as needed (for pain).   Yes Historical Provider, MD  FLUoxetine (PROZAC) 20 MG tablet Take 20 mg by mouth daily.   Yes Historical Provider, MD  levothyroxine (SYNTHROID, LEVOTHROID) 112 MCG tablet Take 112 mcg by mouth daily before breakfast.   Yes Historical Provider, MD  losartan (COZAAR) 100 MG tablet Take 100 mg by mouth daily.    Yes Historical Provider, MD  Multiple Vitamins-Minerals (MULTIVITAMIN PO) Take 1 tablet by mouth daily.   Yes Historical Provider, MD  Omega-3 Fatty Acids (FISH OIL PO) Take 3 capsules by mouth daily.    Yes Historical Provider, MD  traMADol (ULTRAM) 50 MG tablet Take 50 mg by mouth every 6 (six) hours as needed for moderate pain.    Yes Historical Provider, MD  cefdinir (OMNICEF) 300 MG capsule Take 1 capsule (300 mg total) by mouth 2 (two) times daily. Patient not taking: Reported on 01/21/2017 06/20/16   Janne Napoleon, NP  oxybutynin (DITROPAN) 5 MG tablet Take 1 tablet (5 mg total) by mouth 3 (three) times  daily as needed for bladder spasms. Patient not taking: Reported on 01/21/2017 06/30/16   Waldemar Dickens, MD    Family History History reviewed. No pertinent family history.  Social History Social History  Substance Use Topics  . Smoking status: Never Smoker  . Smokeless tobacco: Never Used  . Alcohol use No     Allergies   Ciprofloxacin; Latex; Levofloxacin; Macrobid [nitrofurantoin monohyd macro]; Nitrofurantoin; and Sulfa antibiotics   Review of Systems Review of Systems  Constitutional: Negative for chills and fever.  HENT: Negative for congestion and rhinorrhea.   Eyes: Negative for visual disturbance.  Respiratory: Positive for chest  tightness. Negative for cough, shortness of breath and wheezing.   Cardiovascular: Negative for chest pain and leg swelling.  Gastrointestinal: Negative for abdominal pain, diarrhea, nausea and vomiting.  Genitourinary: Negative for dysuria and flank pain.  Musculoskeletal: Negative for neck pain and neck stiffness.  Skin: Negative for rash and wound.  Allergic/Immunologic: Negative for immunocompromised state.  Neurological: Positive for syncope and light-headedness. Negative for weakness and headaches.  All other systems reviewed and are negative.    Physical Exam Updated Vital Signs BP 151/96   Pulse 68   Temp 97.6 F (36.4 C) (Oral)   Resp 17   Ht 5\' 10"  (1.778 m)   Wt 210 lb 6.4 oz (95.4 kg)   SpO2 98%   BMI 30.19 kg/m   Physical Exam  Constitutional: She is oriented to person, place, and time. She appears well-developed and well-nourished. No distress.  HENT:  Head: Normocephalic and atraumatic.  Eyes: Conjunctivae are normal.  Neck: Neck supple.  Cardiovascular: Normal rate and regular rhythm.  Exam reveals no friction rub.   Murmur heard.  Crescendo decrescendo systolic murmur is present with a grade of 3/6  Pulmonary/Chest: Effort normal and breath sounds normal. No respiratory distress. She has no wheezes. She has no rales.  Abdominal: She exhibits no distension.  Musculoskeletal: She exhibits no edema.  Neurological: She is alert and oriented to person, place, and time. She exhibits normal muscle tone.  Skin: Skin is warm. Capillary refill takes less than 2 seconds.  Psychiatric: She has a normal mood and affect.  Nursing note and vitals reviewed.   Neurological Exam:  Mental Status: Alert and oriented to person, place, and time. Attention and concentration normal. Speech clear. Recent memory is intact. Cranial Nerves: Visual fields grossly intact. EOMI and PERRLA. No nystagmus noted. Facial sensation intact at forehead, maxillary cheek, and chin/mandible  bilaterally. No facial asymmetry or weakness. Hearing grossly normal. Uvula is midline, and palate elevates symmetrically. Normal SCM and trapezius strength. Tongue midline without fasciculations. Motor: Muscle strength 5/5 in proximal and distal UE and LE bilaterally. No pronator drift. Muscle tone normal. Reflexes: 2+ and symmetrical in all four extremities.  Sensation: Intact to light touch in upper and lower extremities distally bilaterally.  Gait: Normal without ataxia. Coordination: Normal FTN bilaterally.   ED Treatments / Results  Labs (all labs ordered are listed, but only abnormal results are displayed) Labs Reviewed  COMPREHENSIVE METABOLIC PANEL - Abnormal; Notable for the following:       Result Value   Total Protein 6.4 (*)    GFR calc non Af Amer 52 (*)    All other components within normal limits  D-DIMER, QUANTITATIVE (NOT AT [facility]) - Abnormal; Notable for the following:    D-Dimer, Quant 0.58 (*)    All other components within normal limits  CBC WITH DIFFERENTIAL/PLATELET  MAGNESIUM  TSH  TROPONIN I  VITAMIN B12  CBC  CREATININE, SERUM  TROPONIN I  TROPONIN I  TROPONIN I  CBC  BASIC METABOLIC PANEL  TROPONIN I  TROPONIN I  I-STAT TROPOININ, ED    EKG  EKG Interpretation  Date/Time:  Saturday January 21 2017 13:29:34 EST Ventricular Rate:  66 PR Interval:    QRS Duration: 104 QT Interval:  430 QTC Calculation: 451 R Axis:   58 Text Interpretation:  Sinus rhythm RSR' in V1 or V2, right VCD or RVH No old tracing to compare Confirmed by Rachael Zapanta MD, Mikenzi Raysor [phone]) on 01/21/2017 3:33:24 PM       Radiology Dg Chest 2 View  Result Date: 01/21/2017 CLINICAL DATA:  83 year old female with near syncope today. Weakness and lightheadedness. Initial encounter. EXAM: CHEST  2 VIEW COMPARISON:  None. FINDINGS: Large lung volumes. Mild tortuosity of the thoracic aorta. Calcified aortic atherosclerosis. Other mediastinal contours are within normal limits.  Visualized tracheal air column is within normal limits. No pneumothorax, pulmonary edema, pleural effusion or consolidation. Small calcified granuloma in the left mid lung. Mild eventration of the diaphragm. Minimal streaky opacity at both lung bases most resembles scarring. Osteopenia. Degenerative changes in the lumbar spine. No acute osseous abnormality identified. IMPRESSION: 1. Pulmonary hyperinflation.  No acute cardiopulmonary abnormality. 2.  Calcified aortic atherosclerosis. Electronically Signed   By: Genevie Ann M.D.   On: 01/21/2017 14:41   Ct Head Wo Contrast  Result Date: 01/21/2017 CLINICAL DATA:  Headache with near syncope EXAM: CT HEAD WITHOUT CONTRAST TECHNIQUE: Contiguous axial images were obtained from the base of the skull through the vertex without intravenous contrast. COMPARISON:  None. FINDINGS: Brain: There is mild diffuse atrophy. There is no intracranial mass, hemorrhage, extra-axial fluid collection, or midline shift. There is mild small vessel disease in the centra semiovale bilaterally. Elsewhere gray-white compartments appear normal. No acute infarct evident. Vascular: No hyperdense vessel. There are foci of calcification in each carotid siphon region. Skull: The bony calvarium appears intact. Sinuses/Orbits: Visualized paranasal sinuses are clear. Orbits appear symmetric bilaterally. Other: Mastoid air cells are clear. IMPRESSION: Mild atrophy with mild periventricular small vessel disease. No intracranial mass, hemorrhage, or extra-axial fluid collection. No acute infarct evident. There are scattered foci of arterial vascular calcification. Electronically Signed   By: Lowella Grip III M.D.   On: 01/21/2017 15:06    Procedures Procedures (including critical care time)  Medications Ordered in ED Medications  ALPRAZolam (XANAX) tablet 0.5 mg (not administered)  atenolol (TENORMIN) tablet 25 mg (not administered)  FLUoxetine (PROZAC) tablet 20 mg (not administered)    levothyroxine (SYNTHROID, LEVOTHROID) tablet 112 mcg (not administered)  losartan (COZAAR) tablet 100 mg (not administered)  traMADol (ULTRAM) tablet 50 mg (not administered)  sodium chloride flush (NS) 0.9 % injection 3 mL (not administered)  heparin injection 5,000 Units (not administered)  0.9 %  sodium chloride infusion ( Intravenous New Bag/Given 01/21/17 1752)  aspirin tablet 325 mg (325 mg Oral Given 01/21/17 1751)     Initial Impression / Assessment and Plan / ED Course  I have reviewed the triage vital signs and the nursing notes.  Pertinent labs & imaging results that were available during my care of the patient were reviewed by me and considered in my medical decision making (see chart for details).     83 year old female with past medical history as above here with syncopal episode preceded by substernal chest pressure. On exam, patient does have blowing systolic murmur concerning for aortic stenosis.  She also has history of A. fib but appears to be in sinus rhythm here. Initial differential includes transient A. fib with syncope, less likely ACS. No evidence of PE. She has no focal neurological deficits to suggest CVA. Given her history of A. fib as well as murmur on exam with no recent echoes, will admit for echo, telemetry, and further workup. Screening labs are unremarkable. CT head is negative.  Final Clinical Impressions(s) / ED Diagnoses   Final diagnoses:  Near syncope     Duffy Bruce, MD 01/21/17 1946

## 2017-01-21 NOTE — ED Triage Notes (Signed)
Pt BIB [facility] from [facility] where pt had a near syncopal event while walking to her room. Event was witnessed by nurse at facility that denies any trauma but did report that pt was falling asleep. Pt reports she remembers the entire event but also reports she "folded up and LOC" Pt denies SOB, N/V. Reports headache and tightness in chest

## 2017-01-21 NOTE — H&P (Signed)
[facility] H&P   Patient Demographics:    Faith Parker, is a 83 y.o. female  MRN: [id number]   DOB - [birth year]  Admit Date - 01/21/2017  Outpatient Primary MD for the patient is No PCP Per Patient  Referring MD/NP/PA: Dr Bland Span  [facility] from: [facility]  Chief Complaint  Patient presents with  . Near Syncope  . Atrial Fibrillation      HPI:    Faith Parker  is a 83 y.o. female, Past medical history of hypertension, hypothyroidism, history of atrial fibrillation(she reports more than 10 years ago, was never anticoagulation), patient recently moved from [place] last July, a shunt presents with near syncope, patient reports she was walking towards the elevator, then she felt lightheaded, felt almost like passing out, but someone brought her a chair where she sat on, reports she felt better after a few seconds, denies any total loss of consciousness, fall or head trauma, she reports chest tightness a few seconds. To this episode, she denies any shortness of breath, palpitation or diaphoresis, she reports dull headache this a.m.. In ED patient was not orthostatic, no significant lab abnormalities, EKG in sinus rhythm, first troponin is negative, CT head with no acute finding, patient with murmur on physical exam(H she reports it has been known in the past), I was called to admit.    Review of systems:    In addition to the HPI above,  No Fever-chills, No Headache, No changes with Vision or hearing, No problems swallowing food or Liquids, Complains of one episode of chest tightness , denies Cough or Shortness of Breath, reports near syncope No Abdominal pain, No Nausea or Vommitting, Bowel movements are regular, No Blood in stool or Urine, No dysuria, No new skin rashes or bruises, No new joints pains-aches,  No new weakness, tingling, numbness in any extremity, No recent weight  gain or loss, No polyuria, polydypsia or polyphagia, No significant Mental Stressors.  A full 10 point Review of Systems was done, except as stated above, all other Review of Systems were negative.   With Past History of the following :    Past Medical History:  Diagnosis Date  . Anxiety   . Aortic regurgitation   . Gastritis   . GERD (gastroesophageal reflux disease)   . H/O artificial eye lens   . Hypertension   . Hypothyroid   . Left ventricular hypertrophy   . Major depression   . Osteoarthritis   . Osteoporosis   . Peptic ulcer disease   . Stress incontinence       History reviewed. No pertinent surgical history.    Social History:     Social History  Substance Use Topics  . Smoking status: Never Smoker  . Smokeless tobacco: Never Used  . Alcohol use No     Lives - At [facility] home  Mobility - independent  Family History :   Family history reviewed, noncontributory   Home Medications:   Prior to Admission medications   Medication Sig Start Date End Date Taking? Authorizing Provider  ALPRAZolam Duanne Moron) 0.5 MG tablet Take 0.5 mg by mouth 3 (three) times daily as needed for anxiety.   Yes Historical Provider, MD  Ascorbic Acid (VITAMIN C PO) Take 1 tablet by mouth daily.   Yes Historical Provider, MD  atenolol (TENORMIN) 25 MG tablet Take 25 mg by mouth 2 (two) times daily.    Yes Historical Provider, MD  cephALEXin (KEFLEX) 250 MG capsule Take 250 mg by mouth daily.   Yes Historical Provider, MD  Cyanocobalamin (VITAMIN B-12 PO) Take 1 tablet by mouth daily.   Yes Historical Provider, MD  diclofenac sodium (VOLTAREN) 1 % GEL Apply 1 application topically as needed (for pain).   Yes Historical Provider, MD  FLUoxetine (PROZAC) 20 MG tablet Take 20 mg by mouth daily.   Yes Historical Provider, MD  levothyroxine (SYNTHROID, LEVOTHROID) 112 MCG tablet Take 112 mcg by mouth daily before breakfast.   Yes Historical Provider, MD  losartan (COZAAR) 100 MG  tablet Take 100 mg by mouth daily.    Yes Historical Provider, MD  Multiple Vitamins-Minerals (MULTIVITAMIN PO) Take 1 tablet by mouth daily.   Yes Historical Provider, MD  Omega-3 Fatty Acids (FISH OIL PO) Take 3 capsules by mouth daily.    Yes Historical Provider, MD  traMADol (ULTRAM) 50 MG tablet Take 50 mg by mouth every 6 (six) hours as needed for moderate pain.    Yes Historical Provider, MD  cefdinir (OMNICEF) 300 MG capsule Take 1 capsule (300 mg total) by mouth 2 (two) times daily. Patient not taking: Reported on 01/21/2017 06/20/16   Janne Napoleon, NP  oxybutynin (DITROPAN) 5 MG tablet Take 1 tablet (5 mg total) by mouth 3 (three) times daily as needed for bladder spasms. Patient not taking: Reported on 01/21/2017 06/30/16   Waldemar Dickens, MD     Allergies:     Allergies  Allergen Reactions  . Ciprofloxacin Other (See Comments)    Unknown  . Latex Other (See Comments)    Unknown  . Levofloxacin Other (See Comments)    Unknown  . Macrobid [employer] Macro] Other (See Comments)    Unknown  . Nitrofurantoin Other (See Comments)    Unknown  . Sulfa Antibiotics Other (See Comments)    Unknown     Physical Exam:   Vitals  Blood pressure 151/96, pulse 68, temperature 97.6 F (36.4 C), temperature source Oral, resp. rate 17, height 5\' 8"  (1.727 m), weight 86.2 kg (190 lb), SpO2 98 %.   1. General Well-developed elderly female lying in bed in NAD,   2. Normal affect and insight, Not Suicidal or Homicidal, Awake Alert, Oriented X 3.  3. No F.N deficits, ALL C.Nerves Intact, Strength 5/5 all 4 extremities, Sensation intact all 4 extremities, Plantars down going.  4. Ears and Eyes appear Normal, Conjunctivae clear, PERRLA. Moist Oral Mucosa.  5. Supple Neck, No JVD, No cervical lymphadenopathy appriciated, No Carotid Bruits.  6. Symmetrical Chest wall movement, Good air movement bilaterally, CTAB.  7. RRR, No Gallops, Rubs , SEM+.  8. Positive Bowel Sounds,  Abdomen Soft, No tenderness, No organomegaly appriciated,No rebound -guarding or rigidity.  9.  No Cyanosis, Normal Skin Turgor, No Skin Rash or Bruise.  10. Good muscle tone,  joints appear normal , no effusions, Normal ROM.  11. No Palpable Lymph Nodes  in Neck or Axillae    Data Review:    CBC  Recent Labs Lab 01/21/17 1430  WBC 5.3  HGB 13.1  HCT 40.0  PLT 178  MCV 93.2  MCH 30.5  MCHC 32.8  RDW 14.0  LYMPHSABS 1.0  MONOABS 0.4  EOSABS 0.1  BASOSABS 0.0   ------------------------------------------------------------------------------------------------------------------  Chemistries   Recent Labs Lab 01/21/17 1430  NA 138  K 4.6  CL 102  CO2 25  GLUCOSE 93  BUN 19  CREATININE 0.97  CALCIUM 9.3  MG 1.9  AST 25  ALT 15  ALKPHOS 44  BILITOT 0.9   ------------------------------------------------------------------------------------------------------------------ estimated creatinine clearance is 49.6 mL/min (by C-G formula based on SCr of 0.97 mg/dL). ------------------------------------------------------------------------------------------------------------------ No results for input(s): TSH, T4TOTAL, T3FREE, THYROIDAB in the last 72 hours.  Invalid input(s): FREET3  Coagulation profile No results for input(s): INR, PROTIME in the last 168 hours. ------------------------------------------------------------------------------------------------------------------- No results for input(s): DDIMER in the last 72 hours. -------------------------------------------------------------------------------------------------------------------  Cardiac Enzymes No results for input(s): CKMB, TROPONINI, MYOGLOBIN in the last 168 hours.  Invalid input(s): CK ------------------------------------------------------------------------------------------------------------------ No results found for:  BNP   ---------------------------------------------------------------------------------------------------------------  Urinalysis    Component Value Date/Time   LABSPEC 1.010 06/30/2016 1149   PHURINE 6.5 06/30/2016 1149   GLUCOSEU NEGATIVE 06/30/2016 1149   HGBUR NEGATIVE 06/30/2016 [place] 06/30/2016 1149   KETONESUR NEGATIVE 06/30/2016 1149   PROTEINUR NEGATIVE 06/30/2016 1149   UROBILINOGEN 0.2 06/30/2016 1149   NITRITE NEGATIVE 06/30/2016 [place] 06/30/2016 1149    ----------------------------------------------------------------------------------------------------------------   Imaging Results:    Dg Chest 2 View  Result Date: 01/21/2017 CLINICAL DATA:  83 year old female with near syncope today. Weakness and lightheadedness. Initial encounter. EXAM: CHEST  2 VIEW COMPARISON:  None. FINDINGS: Large lung volumes. Mild tortuosity of the thoracic aorta. Calcified aortic atherosclerosis. Other mediastinal contours are within normal limits. Visualized tracheal air column is within normal limits. No pneumothorax, pulmonary edema, pleural effusion or consolidation. Small calcified granuloma in the left mid lung. Mild eventration of the diaphragm. Minimal streaky opacity at both lung bases most resembles scarring. Osteopenia. Degenerative changes in the lumbar spine. No acute osseous abnormality identified. IMPRESSION: 1. Pulmonary hyperinflation.  No acute cardiopulmonary abnormality. 2.  Calcified aortic atherosclerosis. Electronically Signed   By: Genevie Ann M.D.   On: 01/21/2017 14:41   Ct Head Wo Contrast  Result Date: 01/21/2017 CLINICAL DATA:  Headache with near syncope EXAM: CT HEAD WITHOUT CONTRAST TECHNIQUE: Contiguous axial images were obtained from the base of the skull through the vertex without intravenous contrast. COMPARISON:  None. FINDINGS: Brain: There is mild diffuse atrophy. There is no intracranial mass, hemorrhage, extra-axial  fluid collection, or midline shift. There is mild small vessel disease in the centra semiovale bilaterally. Elsewhere gray-white compartments appear normal. No acute infarct evident. Vascular: No hyperdense vessel. There are foci of calcification in each carotid siphon region. Skull: The bony calvarium appears intact. Sinuses/Orbits: Visualized paranasal sinuses are clear. Orbits appear symmetric bilaterally. Other: Mastoid air cells are clear. IMPRESSION: Mild atrophy with mild periventricular small vessel disease. No intracranial mass, hemorrhage, or extra-axial fluid collection. No acute infarct evident. There are scattered foci of arterial vascular calcification. Electronically Signed   By: Lowella Grip III M.D.   On: 01/21/2017 15:06    My personal review of EKG: Rhythm NSR, Rate  66 /min, QTc 451 , no Acute ST changes   Assessment & Plan:    Active Problems:  Syncope   Hypertension   Hypothyroidism  Near syncope - Patient is not orthostatic, no significant evidence for abnormalities, CT head with no acute findings, and she is with significant murmur, and questionable history of A. fib, so would admit her to telemetry, and obtain 2-D echo, will check TSH, B-12, given chest tightness. Episodes will check d-dimer, and cycle troponins  Chest pain - We'll check T dimers if positive will obtain CTA chest to rule out PE, will monitor on telemetry, obtain 2-D echo, and cycle troponins. - will give one dose of full dose aspirin pending further workup  Hypothyroidism - We'll check TSH  Hypertension - She is not orthostatic, blood pressure is acceptable, so we'll resume on home medication  History of A. Fib - Patient reports it has been remote, more than 10 years ago with no recurrence, will continue to monitor on telemetry  DVT Prophylaxis Heparin -  SCDs  AM Labs Ordered,  please review Full Orders  Family Communication: Admission, patients condition and plan of care including  tests being ordered have been discussed with the patient and daughter who indicate understanding and agree with the plan and Code Status.  Code Status Full  Likely DC to  [facility]  Condition GUARDED    Consults called: None  Admission status: Observation  Time spent in minutes : 55 minutes   Zakiah Gauthreaux M.D on 01/21/2017 at 4:39 PM  Between 7am to 7pm - Pager - [phone]. After 7pm go to [email or web address] - password [email or web address]  [facility] - Office  [phone]

## 2017-01-21 NOTE — ED Notes (Signed)
ED Provider at bedside. 

## 2017-01-22 ENCOUNTER — Observation Stay (HOSPITAL_COMMUNITY): Payer: Medicare Other

## 2017-01-22 ENCOUNTER — Observation Stay (HOSPITAL_BASED_OUTPATIENT_CLINIC_OR_DEPARTMENT_OTHER): Payer: Medicare Other

## 2017-01-22 DIAGNOSIS — I10 Essential (primary) hypertension: Secondary | ICD-10-CM | POA: Diagnosis not present

## 2017-01-22 DIAGNOSIS — R55 Syncope and collapse: Secondary | ICD-10-CM | POA: Diagnosis not present

## 2017-01-22 DIAGNOSIS — Z91040 Latex allergy status: Secondary | ICD-10-CM | POA: Diagnosis not present

## 2017-01-22 DIAGNOSIS — K219 Gastro-esophageal reflux disease without esophagitis: Secondary | ICD-10-CM | POA: Diagnosis present

## 2017-01-22 DIAGNOSIS — M1990 Unspecified osteoarthritis, unspecified site: Secondary | ICD-10-CM | POA: Diagnosis present

## 2017-01-22 DIAGNOSIS — I712 Thoracic aortic aneurysm, without rupture: Secondary | ICD-10-CM | POA: Diagnosis present

## 2017-01-22 DIAGNOSIS — E039 Hypothyroidism, unspecified: Secondary | ICD-10-CM | POA: Diagnosis not present

## 2017-01-22 DIAGNOSIS — I351 Nonrheumatic aortic (valve) insufficiency: Secondary | ICD-10-CM | POA: Diagnosis present

## 2017-01-22 DIAGNOSIS — I352 Nonrheumatic aortic (valve) stenosis with insufficiency: Secondary | ICD-10-CM | POA: Diagnosis present

## 2017-01-22 DIAGNOSIS — Z888 Allergy status to other drugs, medicaments and biological substances status: Secondary | ICD-10-CM | POA: Diagnosis not present

## 2017-01-22 DIAGNOSIS — E890 Postprocedural hypothyroidism: Secondary | ICD-10-CM | POA: Diagnosis present

## 2017-01-22 DIAGNOSIS — Z882 Allergy status to sulfonamides status: Secondary | ICD-10-CM | POA: Diagnosis not present

## 2017-01-22 DIAGNOSIS — I4891 Unspecified atrial fibrillation: Secondary | ICD-10-CM | POA: Diagnosis not present

## 2017-01-22 DIAGNOSIS — I480 Paroxysmal atrial fibrillation: Secondary | ICD-10-CM | POA: Diagnosis not present

## 2017-01-22 DIAGNOSIS — R51 Headache: Secondary | ICD-10-CM | POA: Diagnosis not present

## 2017-01-22 DIAGNOSIS — M810 Age-related osteoporosis without current pathological fracture: Secondary | ICD-10-CM | POA: Diagnosis present

## 2017-01-22 DIAGNOSIS — R079 Chest pain, unspecified: Secondary | ICD-10-CM | POA: Diagnosis not present

## 2017-01-22 DIAGNOSIS — R011 Cardiac murmur, unspecified: Secondary | ICD-10-CM | POA: Diagnosis present

## 2017-01-22 DIAGNOSIS — Z8711 Personal history of peptic ulcer disease: Secondary | ICD-10-CM | POA: Diagnosis not present

## 2017-01-22 DIAGNOSIS — Z79899 Other long term (current) drug therapy: Secondary | ICD-10-CM | POA: Diagnosis not present

## 2017-01-22 LAB — CBC
HEMATOCRIT: 35.8 % — AB (ref 36.0–46.0)
HEMOGLOBIN: 11.9 g/dL — AB (ref 12.0–15.0)
MCH: 31.1 pg (ref 26.0–34.0)
MCHC: 33.2 g/dL (ref 30.0–36.0)
MCV: 93.5 fL (ref 78.0–100.0)
Platelets: 156 10*3/uL (ref 150–400)
RBC: 3.83 MIL/uL — AB (ref 3.87–5.11)
RDW: 13.9 % (ref 11.5–15.5)
WBC: 3.7 10*3/uL — ABNORMAL LOW (ref 4.0–10.5)

## 2017-01-22 LAB — BASIC METABOLIC PANEL
Anion gap: 9 (ref 5–15)
BUN: 16 mg/dL (ref 6–20)
CHLORIDE: 102 mmol/L (ref 101–111)
CO2: 27 mmol/L (ref 22–32)
Calcium: 9.1 mg/dL (ref 8.9–10.3)
Creatinine, Ser: 0.82 mg/dL (ref 0.44–1.00)
GFR calc Af Amer: >60 mL/min (ref >60)
GFR calc non Af Amer: >60 mL/min (ref >60)
Glucose, Bld: 97 mg/dL (ref 65–99)
POTASSIUM: 4.1 mmol/L (ref 3.5–5.1)
SODIUM: 138 mmol/L (ref 135–145)

## 2017-01-22 LAB — ECHOCARDIOGRAM COMPLETE
AO mean calculated velocity dopler: 140 cm/s
AOPV: 0.57 m/s
AV Area VTI index: 0.69 cm2/m2
AV Area VTI: 1.44 cm2
AV Area mean vel: 1.48 cm2
AV Mean grad: 9 mmHg
AV Peak grad: 17 mmHg
AV peak Index: 0.66
AV pk vel: 205 cm/s
AVAREAMEANVIN: 0.67 cm2/m2
AVCELMEANRAT: 0.58
AVLVOTPG: 5 mmHg
CHL CUP AV VEL: 1.52
CHL CUP RV SYS PRESS: 26 mmHg
CHL CUP TV REG PEAK VELOCITY: 240 cm/s
E decel time: 229 msec
E/e' ratio: 11.73
FS: 35 % (ref 28–44)
HEIGHTINCHES: 70 in
IVS/LV PW RATIO, ED: 0.91
LA ID, A-P, ES: 42 mm
LA vol A4C: 44.4 ml
LADIAMINDEX: 1.91 cm/m2
LAVOL: 55.6 mL
LAVOLIN: 25.3 mL/m2
LDCA: 2.54 cm2
LEFT ATRIUM END SYS DIAM: 42 mm
LV E/e' medial: 11.73
LV E/e'average: 11.73
LV TDI E'MEDIAL: 4.33
LV e' LATERAL: 6.7 cm/s
LVOT SV: 70 mL
LVOT VTI: 27.6 cm
LVOT diameter: 18 mm
LVOT peak vel: 116 cm/s
LVOTVTI: 0.6 cm
MV Dec: 229
MV pk A vel: 83.9 m/s
MVPG: 2 mmHg
MVPKEVEL: 78.6 m/s
PW: 11.9 mm — AB (ref 0.6–1.1)
RV LATERAL S' VELOCITY: 10.7 cm/s
TAPSE: 17.1 mm
TDI e' lateral: 6.7
TR max vel: 240 cm/s
VTI: 46 cm
Valve area index: 0.69
Valve area: 1.52 cm2
WEIGHTICAEL: 3366.4 [oz_av]

## 2017-01-22 LAB — TROPONIN I

## 2017-01-22 LAB — GLUCOSE, CAPILLARY: GLUCOSE-CAPILLARY: 90 mg/dL (ref 65–99)

## 2017-01-22 MED ORDER — ASPIRIN EC 81 MG PO TBEC
81.0000 mg | DELAYED_RELEASE_TABLET | Freq: Every day | ORAL | Status: DC
  Administered 2017-01-23: 81 mg via ORAL
  Filled 2017-01-22 (×2): qty 1

## 2017-01-22 MED ORDER — FLUOXETINE HCL 20 MG PO CAPS
20.0000 mg | ORAL_CAPSULE | Freq: Every day | ORAL | Status: DC
  Administered 2017-01-22 – 2017-01-23 (×2): 20 mg via ORAL
  Filled 2017-01-22 (×2): qty 1

## 2017-01-22 NOTE — Care Management Obs Status (Signed)
[facility] NOTIFICATION   Patient Details  Name: Faith Parker MRN: [id number] Date of Birth: 1930/06/16   Medicare Observation Status Notification Given:  Yes    Dawayne Patricia, RN 01/22/2017, 4:41 PM

## 2017-01-22 NOTE — Progress Notes (Signed)
  Echocardiogram 2D Echocardiogram has been performed.  Faith Parker 01/22/2017, 3:29 PM

## 2017-01-22 NOTE — Progress Notes (Addendum)
PROGRESS NOTE                                                                                                                                                                                                             Patient Demographics:    Faith Parker, is a 83 y.o. female, DOB - 1930-01-16, [id number]  Admit date - 01/21/2017   Admitting Physician Albertine Patricia, MD  Outpatient Primary MD for the patient is No PCP Per Patient  LOS - 0  Chief Complaint  Patient presents with  . Near Syncope  . Atrial Fibrillation       Brief Narrative   83 y.o. female, Past medical history of hypertension, hypothyroidism, history of atrial fibrillation(she reports more than 10 years ago, was never anticoagulation), She presents with near syncope   Subjective:    Faith Parker today has, No headache, No chest pain, No abdominal pain - No Nausea, No new weakness tingling or numbness, No Cough - SOB.    Assessment  & Plan :    Active Problems:   Syncope   Hypertension   Hypothyroidism   Near syncope - Patient is not orthostatic, no significant evidence for abnormalities, CT head with no acute findings, and she is with significant murmur tear 2-D echo showing only mild aortic stenosis/regurgitation, has history of questionable history of A. fib, but so far no evidence of arrhythmias on telemetry , TSH within normal limit, B-12 within normal limits, not orthostatic, she is with unsteady gait today, so will obtain MRI brain to rule out acute ischemic event  - Her d-dimer is borderline, if no etiology of her syncope could be defined, will obtain CTA chest to rule out PE.  Chest pain -  not do any further chest pain since admission, with preserved EF 60-65% with no regional wall motion abnormalities, troponins negative 3  Hypothyroidism Synthroid, TSH within normal limits  Hypertension - She is not orthostatic, blood pressure is acceptable,   continue withme medication  History of A. Fib - Patient reports it has been remote, more than 10 years ago with no recurrence, will continue to monitor on telemetry   Code Status : Full  Family Communication  : None at bedside  Disposition Plan  : Pending PT  Consults  :  None  Procedures  : None  DVT Prophylaxis  :  Heparin - SCDs   Lab Results  Component Value Date   PLT 156 01/22/2017    Antibiotics  :    Anti-infectives    None        Objective:   Vitals:   01/21/17 2244 01/22/17 0351 01/22/17 0556 01/22/17 1108  BP: (!) 124/56  140/72 132/63  Pulse: 68  68 71  Resp: 20  20 16   Temp: 98.5 F (36.9 C)  98.1 F (36.7 C) 97.7 F (36.5 C)  TempSrc: Oral  Oral Oral  SpO2: 99% 96% 96% 96%  Weight:   95.4 kg (210 lb 6.4 oz)   Height:        Wt Readings from Last 3 Encounters:  01/22/17 95.4 kg (210 lb 6.4 oz)  06/20/16 86.2 kg (190 lb)     Intake/Output Summary (Last 24 hours) at 01/22/17 1631 Last data filed at 01/22/17 1000  Gross per 24 hour  Intake          1086.67 ml  Output                0 ml  Net          1086.67 ml     Physical Exam  Awake Alert, Oriented X 3, No new F.N deficits, Normal affect Supple Neck,No JVD,  Symmetrical Chest wall movement, Good air movement bilaterally, CTAB RRR,No Gallops,Rubs or new Murmurs, No Parasternal Heave +ve B.Sounds, Abd Soft, No tenderness, No rebound - guarding or rigidity. No Cyanosis, Clubbing or edema, No new Rash or bruise , negative Romberg sign, but unsteady gait on ambulation    Data Review:    CBC  Recent Labs Lab 01/21/17 1430 01/21/17 1801 01/22/17 0517  WBC 5.3 4.6 3.7*  HGB 13.1 12.3 11.9*  HCT 40.0 36.6 35.8*  PLT 178 168 156  MCV 93.2 92.7 93.5  MCH 30.5 31.1 31.1  MCHC 32.8 33.6 33.2  RDW 14.0 13.8 13.9  LYMPHSABS 1.0  --   --   MONOABS 0.4  --   --   EOSABS 0.1  --   --   BASOSABS 0.0  --   --     Chemistries   Recent Labs Lab 01/21/17 1430 01/21/17 1801  01/22/17 0517  NA 138  --  138  K 4.6  --  4.1  CL 102  --  102  CO2 25  --  27  GLUCOSE 93  --  97  BUN 19  --  16  CREATININE 0.97 0.82 0.82  CALCIUM 9.3  --  9.1  MG 1.9  --   --   AST 25  --   --   ALT 15  --   --   ALKPHOS 44  --   --   BILITOT 0.9  --   --    ------------------------------------------------------------------------------------------------------------------ No results for input(s): CHOL, HDL, LDLCALC, TRIG, CHOLHDL, LDLDIRECT in the last 72 hours.  No results found for: HGBA1C ------------------------------------------------------------------------------------------------------------------  Recent Labs  01/21/17 1430  TSH 0.380   ------------------------------------------------------------------------------------------------------------------  Recent Labs  01/21/17 1801  VITAMINB12 615    Coagulation profile No results for input(s): INR, PROTIME in the last 168 hours.   Recent Labs  01/21/17 1801  DDIMER 0.58*    Cardiac Enzymes  Recent Labs Lab 01/21/17 1801 01/21/17 2222 01/22/17 0517  TROPONINI <0.03  <0.03 <0.03  <0.03 <0.03   ------------------------------------------------------------------------------------------------------------------ No results found for: BNP  Inpatient Medications  Scheduled Meds: .  atenolol  25 mg Oral BID  . FLUoxetine  20 mg Oral Daily  . heparin  5,000 Units Subcutaneous Q8H  . levothyroxine  112 mcg Oral QAC breakfast  . losartan  100 mg Oral Daily  . sodium chloride flush  3 mL Intravenous Q12H   Continuous Infusions: . sodium chloride 50 mL/hr at 01/21/17 1752   PRN Meds:.ALPRAZolam, traMADol  Micro Results No results found for this or any previous visit (from the past 240 hour(s)).  Radiology Reports Dg Chest 2 View  Result Date: 01/21/2017 CLINICAL DATA:  83 year old female with near syncope today. Weakness and lightheadedness. Initial encounter. EXAM: CHEST  2 VIEW COMPARISON:   None. FINDINGS: Large lung volumes. Mild tortuosity of the thoracic aorta. Calcified aortic atherosclerosis. Other mediastinal contours are within normal limits. Visualized tracheal air column is within normal limits. No pneumothorax, pulmonary edema, pleural effusion or consolidation. Small calcified granuloma in the left mid lung. Mild eventration of the diaphragm. Minimal streaky opacity at both lung bases most resembles scarring. Osteopenia. Degenerative changes in the lumbar spine. No acute osseous abnormality identified. IMPRESSION: 1. Pulmonary hyperinflation.  No acute cardiopulmonary abnormality. 2.  Calcified aortic atherosclerosis. Electronically Signed   By: Genevie Ann M.D.   On: 01/21/2017 14:41   Ct Head Wo Contrast  Result Date: 01/21/2017 CLINICAL DATA:  Headache with near syncope EXAM: CT HEAD WITHOUT CONTRAST TECHNIQUE: Contiguous axial images were obtained from the base of the skull through the vertex without intravenous contrast. COMPARISON:  None. FINDINGS: Brain: There is mild diffuse atrophy. There is no intracranial mass, hemorrhage, extra-axial fluid collection, or midline shift. There is mild small vessel disease in the centra semiovale bilaterally. Elsewhere gray-white compartments appear normal. No acute infarct evident. Vascular: No hyperdense vessel. There are foci of calcification in each carotid siphon region. Skull: The bony calvarium appears intact. Sinuses/Orbits: Visualized paranasal sinuses are clear. Orbits appear symmetric bilaterally. Other: Mastoid air cells are clear. IMPRESSION: Mild atrophy with mild periventricular small vessel disease. No intracranial mass, hemorrhage, or extra-axial fluid collection. No acute infarct evident. There are scattered foci of arterial vascular calcification. Electronically Signed   By: Lowella Grip III M.D.   On: 01/21/2017 15:06     Gedalya Jim M.D on 01/22/2017 at 4:31 PM  Between 7am to 7pm - Pager - [phone]  After  7pm go to [email or web address] - password [facility]  [facility] -  Office  [phone]

## 2017-01-23 ENCOUNTER — Encounter (HOSPITAL_COMMUNITY): Payer: Self-pay | Admitting: Physician Assistant

## 2017-01-23 ENCOUNTER — Other Ambulatory Visit: Payer: Self-pay | Admitting: Emergency Medicine

## 2017-01-23 ENCOUNTER — Inpatient Hospital Stay (HOSPITAL_COMMUNITY): Payer: Medicare Other

## 2017-01-23 DIAGNOSIS — I712 Thoracic aortic aneurysm, without rupture, unspecified: Secondary | ICD-10-CM

## 2017-01-23 DIAGNOSIS — R55 Syncope and collapse: Secondary | ICD-10-CM

## 2017-01-23 DIAGNOSIS — I480 Paroxysmal atrial fibrillation: Secondary | ICD-10-CM

## 2017-01-23 DIAGNOSIS — I4891 Unspecified atrial fibrillation: Secondary | ICD-10-CM

## 2017-01-23 LAB — BASIC METABOLIC PANEL
Anion gap: 6 (ref 5–15)
BUN: 12 mg/dL (ref 6–20)
CHLORIDE: 106 mmol/L (ref 101–111)
CO2: 27 mmol/L (ref 22–32)
Calcium: 9 mg/dL (ref 8.9–10.3)
Creatinine, Ser: 0.7 mg/dL (ref 0.44–1.00)
GFR calc Af Amer: >60 mL/min (ref >60)
GFR calc non Af Amer: >60 mL/min (ref >60)
GLUCOSE: 106 mg/dL — AB (ref 65–99)
POTASSIUM: 3.7 mmol/L (ref 3.5–5.1)
Sodium: 139 mmol/L (ref 135–145)

## 2017-01-23 LAB — CBC
HEMATOCRIT: 37 % (ref 36.0–46.0)
HEMOGLOBIN: 12.1 g/dL (ref 12.0–15.0)
MCH: 30.3 pg (ref 26.0–34.0)
MCHC: 32.7 g/dL (ref 30.0–36.0)
MCV: 92.5 fL (ref 78.0–100.0)
Platelets: 191 10*3/uL (ref 150–400)
RBC: 4 MIL/uL (ref 3.87–5.11)
RDW: 13.4 % (ref 11.5–15.5)
WBC: 4.4 10*3/uL (ref 4.0–10.5)

## 2017-01-23 LAB — GLUCOSE, CAPILLARY: Glucose-Capillary: 88 mg/dL (ref 65–99)

## 2017-01-23 MED ORDER — APIXABAN 5 MG PO TABS
5.0000 mg | ORAL_TABLET | Freq: Two times a day (BID) | ORAL | Status: DC
  Administered 2017-01-23: 5 mg via ORAL
  Filled 2017-01-23: qty 1

## 2017-01-23 MED ORDER — IOPAMIDOL (ISOVUE-370) INJECTION 76%
INTRAVENOUS | Status: AC
  Administered 2017-01-23: 80 mL via INTRAVENOUS
  Administered 2017-01-23: 09:00:00
  Filled 2017-01-23: qty 100

## 2017-01-23 MED ORDER — ASPIRIN 81 MG PO TBEC: 81.0000 mg | DELAYED_RELEASE_TABLET | Freq: Every day | ORAL | Status: DC

## 2017-01-23 MED ORDER — APIXABAN 5 MG PO TABS: 5.0000 mg | ORAL_TABLET | Freq: Two times a day (BID) | ORAL | 0 refills | Status: DC

## 2017-01-23 NOTE — Progress Notes (Signed)
ANTICOAGULATION CONSULT NOTE - Initial Consult  Pharmacy Consult:  Eliquis Indication: atrial fibrillation  Allergies  Allergen Reactions  . Ciprofloxacin Other (See Comments)    Unknown  . Latex Other (See Comments)    Unknown  . Levofloxacin Other (See Comments)    Unknown  . Macrobid [employer] Macro] Other (See Comments)    Unknown  . Nitrofurantoin Other (See Comments)    Unknown  . Sulfa Antibiotics Other (See Comments)    Unknown    Patient Measurements: Height: 5\' 10"  (177.8 cm) Weight: 210 lb 6.4 oz (95.4 kg) IBW/kg (Calculated) : 68.5  Vital Signs: Temp: 98.3 F (36.8 C) (02/19 1326) Temp Source: Oral (02/19 1326) BP: 139/70 (02/19 1326) Pulse Rate: 71 (02/19 1326)  Labs:  Recent Labs  01/21/17 1801 01/21/17 2222 01/22/17 0517 01/23/17 0530  HGB 12.3  --  11.9* 12.1  HCT 36.6  --  35.8* 37.0  PLT 168  --  156 191  CREATININE 0.82  --  0.82 0.70  TROPONINI <0.03  <0.03 <0.03  <0.03 <0.03  --     Estimated Creatinine Clearance: 65.5 mL/min (by C-G formula based on SCr of 0.7 mg/dL).   Medical History: Past Medical History:  Diagnosis Date  . Anxiety   . Aortic regurgitation   . Gastritis   . GERD (gastroesophageal reflux disease)   . H/O artificial eye lens   . Hypertension   . Hypothyroid   . Left ventricular hypertrophy   . Major depression   . Osteoarthritis   . Osteoporosis   . Peptic ulcer disease   . Stress incontinence     Assessment: 15 YOF with a remote history of AFib not on AC PTA.  Patient has hesitation to be on blood thinners because of her husband's death.  Pharmacy consulted to initiate Eliquis per Card's recommendation.  Based on patient's specific parameters, she qualifies for the full dose.  Baseline labs reviewed; no bleeding reported.   Goal of Therapy:  Appropriate anticoagulation    Plan:  - D/C heparin SQ - Eliquis 5mg  PO BID - Pharmacy will sign off and follow peripherally.  Thank you for  the consult!   Ahley Bulls D. Mina Marble, PharmD, BCPS Pager:  [phone] 01/23/2017, 4:03 PM

## 2017-01-23 NOTE — Evaluation (Signed)
Physical Therapy Evaluation Patient Details Name: NISHELLE NEUKAM MRN: [id number] DOB: 1929-12-15 Today's Date: 01/23/2017   History of Present Illness  83 y.o. female admitted with near syncope. PMH of afib, HTN. Imaging of head negative, orthostatics negative, CTA showed no PE.   Clinical Impression  Pt is independent with mobility, she ambulated 150' without an assistive device without loss of balance, HR 80s with walking. She denied lightheadedness throughout PT eval. No follow up needs, PT signing off.     Follow Up Recommendations No PT follow up    Equipment Recommendations  None recommended by PT    Recommendations for Other Services       Precautions / Restrictions Precautions Precautions: Fall Precaution Comments: near-syncope just prior to admission, pt/family deny other falls; blind L eye Restrictions Weight Bearing Restrictions: No      Mobility  Bed Mobility Overal bed mobility: Modified Independent             General bed mobility comments: with rail, HOB up 30*  Transfers Overall transfer level: Independent                  Ambulation/Gait Ambulation/Gait assistance: Independent Ambulation Distance (Feet): 150 Feet Assistive device: None Gait Pattern/deviations: WFL(Within Functional Limits)   Gait velocity interpretation: at or above normal speed for age/gender General Gait Details: steady without AD, HR 80s, no dizziness  Stairs            Wheelchair Mobility    Modified Rankin (Stroke Patients Only)       Balance Overall balance assessment: Independent                                           Pertinent Vitals/Pain Pain Assessment: No/denies pain    Home Living Family/patient expects to be discharged to:: Private residence   Available Help at Discharge: Family;Available PRN/intermittently Type of Home: Independent living facility       Home Layout: One level Home Equipment: Kasandra Knudsen - single  point      Prior Function Level of Independence: Independent with assistive device(s)         Comments: occaissionally uses SPC, hand held assist for going up/down curbs; independent ADLs     Hand Dominance        Extremity/Trunk Assessment   Upper Extremity Assessment Upper Extremity Assessment: Overall WFL for tasks assessed    Lower Extremity Assessment Lower Extremity Assessment: Overall WFL for tasks assessed    Cervical / Trunk Assessment Cervical / Trunk Assessment: Normal  Communication   Communication: No difficulties  Cognition Arousal/Alertness: Awake/alert Behavior During Therapy: WFL for tasks assessed/performed Overall Cognitive Status: Within Functional Limits for tasks assessed                      General Comments      Exercises     Assessment/Plan    PT Assessment Patent does not need any further PT services  PT Problem List         PT Treatment Interventions      PT Goals (Current goals can be found in the Care Plan section)  Acute Rehab PT Goals Patient Stated Goal: return to ILF at [facility] PT Goal Formulation: All assessment and education complete, DC therapy    Frequency     Barriers to discharge  Co-evaluation               End of Session Equipment Utilized During Treatment: Gait belt Activity Tolerance: Patient tolerated treatment well Patient left: in chair;with family/visitor present;with call bell/phone within reach Nurse Communication: Mobility status           Time: 1040-1108 PT Time Calculation (min) (ACUTE ONLY): 28 min   Charges:   PT Evaluation $PT Eval Low Complexity: 1 Procedure PT Treatments $Gait Training: 8-22 mins   PT G Codes:         Philomena Doheny 01/23/2017, 11:19 AM [phone]

## 2017-01-23 NOTE — Discharge Instructions (Signed)
Follow with Primary MD No PCP Per Patient in 7 days   Get CBC, CMP,  checked  by Primary MD next visit.    Activity: As tolerated with Full fall precautions use walker/cane & assistance as needed   Disposition Home   Diet: Heart Healthy , with feeding assistance and aspiration precautions.   Information on my medicine - ELIQUIS (apixaban)  This medication education was reviewed with me or my healthcare representative as part of my discharge preparation.  The pharmacist that spoke with me during my hospital stay was:  Saundra Shelling, [facility]  Why was Eliquis prescribed for you? Eliquis was prescribed for you to reduce the risk of a blood clot forming that can cause a stroke if you have a medical condition called atrial fibrillation (a type of irregular heartbeat).  What do You need to know about Eliquis ? Take your Eliquis TWICE DAILY - one tablet in the morning and one tablet in the evening with or without food. If you have difficulty swallowing the tablet whole please discuss with your pharmacist how to take the medication safely.  Take Eliquis exactly as prescribed by your doctor and DO NOT stop taking Eliquis without talking to the doctor who prescribed the medication.  Stopping may increase your risk of developing a stroke.  Refill your prescription before you run out.  After discharge, you should have regular check-up appointments with your healthcare provider that is prescribing your Eliquis.  In the future your dose may need to be changed if your kidney function or weight changes by a significant amount or as you get older.  What do you do if you miss a dose? If you miss a dose, take it as soon as you remember on the same day and resume taking twice daily.  Do not take more than one dose of ELIQUIS at the same time to make up a missed dose.  Important Safety Information A possible side effect of Eliquis is bleeding. You should call your healthcare provider right away if  you experience any of the following: ? Bleeding from an injury or your nose that does not stop. ? Unusual colored urine (red or dark brown) or unusual colored stools (red or black). ? Unusual bruising for unknown reasons. ? A serious fall or if you hit your head (even if there is no bleeding).  Some medicines may interact with Eliquis and might increase your risk of bleeding or clotting while on Eliquis. To help avoid this, consult your healthcare provider or pharmacist prior to using any new prescription or non-prescription medications, including herbals, vitamins, non-steroidal anti-inflammatory drugs (NSAIDs) and supplements.  This website has more information on Eliquis (apixaban): http://www.eliquis.com/eliquis/home  On your next visit with your primary care physician please Get Medicines reviewed and adjusted.   Please request your Prim.MD to go over all Hospital Tests and Procedure/Radiological results at the follow up, please get all Hospital records sent to your Prim MD by signing hospital release before you go home.   If you experience worsening of your admission symptoms, develop shortness of breath, life threatening emergency, suicidal or homicidal thoughts you must seek medical attention immediately by calling 911 or calling your MD immediately  if symptoms less severe.  You Must read complete instructions/literature along with all the possible adverse reactions/side effects for all the Medicines you take and that have been prescribed to you. Take any new Medicines after you have completely understood and accpet all the possible adverse reactions/side effects.  Do not drive, operating heavy machinery, perform activities at heights, swimming or participation in water activities or provide baby sitting services if your were admitted for syncope or siezures until you have seen by Primary MD or a Neurologist and advised to do so again.  Do not drive when taking Pain medications.     Do not take more than prescribed Pain, Sleep and Anxiety Medications  Special Instructions: If you have smoked or chewed Tobacco  in the last 2 yrs please stop smoking, stop any regular Alcohol  and or any Recreational drug use.  Wear Seat belts while driving.   Please note  You were cared for by a hospitalist during your hospital stay. If you have any questions about your discharge medications or the care you received while you were in the hospital after you are discharged, you can call the unit and asked to speak with the hospitalist on call if the hospitalist that took care of you is not available. Once you are discharged, your primary care physician will handle any further medical issues. Please note that NO REFILLS for any discharge medications will be authorized once you are discharged, as it is imperative that you return to your primary care physician (or establish a relationship with a primary care physician if you do not have one) for your aftercare needs so that they can reassess your need for medications and monitor your lab values.

## 2017-01-23 NOTE — Consult Note (Signed)
CARDIOLOGY CONSULT NOTE   Patient ID: Faith Parker MRN: [id number] DOB/AGE: 01/29/1930 83 y.o.  Admit date: 01/21/2017  Requesting Physician: Primary Physician:   No PCP Per Patient Primary Cardiologist:   None Reason for Consultation:   Atrial fibrillation  HPI: Faith Parker is a 83 y.o. female with a history of aortic regurgitation, gastritis, hypertension, hypothyroid, LVH, depression and anxiety presented to the ER on 2/17 after a syncopal episode that had happened earlier that day. She was ambulating when she experienced chest pressure, dull/aching with lightheadedness.  Non acute MRI of brain, 2-D echo done with shows mild aortic stenosis/regurg, questionable hx of A.fib. TSH WNL, B-12 normal, not orthostatic. CT angio chest r/o PE but does show a 4.1 cm aortic aneurysm.   Today patient is feeling tired as she has not been sleeping well. She tells me that she had aFib a few times in her life and always proceeded by severe stress. In past 10 years she has had less than 5 episodes. She is currently stressed because her apartment at an independent nursing facility is being renovated and she is displaced. She felt her heart in a.fib before she passed out and was told that she was in a.fib in the ambulance. Denies having any further episodes of chest pains, palpitations or SOB since arriving to the hospital. No LE swelling, active CP, SOB.  Patient is ready to go home and would prefer not to be on anticoagulation but will if It is recommended.  Past Medical History:  Diagnosis Date  . Anxiety   . Aortic regurgitation   . Gastritis   . GERD (gastroesophageal reflux disease)   . H/O artificial eye lens   . Hypertension   . Hypothyroid   . Left ventricular hypertrophy   . Major depression   . Osteoarthritis   . Osteoporosis   . Peptic ulcer disease   . Stress incontinence      Past Surgical History:  Procedure Laterality Date  . LEG SURGERY Right    multiple  surgeries, unknown type    Allergies  Allergen Reactions  . Ciprofloxacin Other (See Comments)    Unknown  . Latex Other (See Comments)    Unknown  . Levofloxacin Other (See Comments)    Unknown  . Macrobid [employer] Macro] Other (See Comments)    Unknown  . Nitrofurantoin Other (See Comments)    Unknown  . Sulfa Antibiotics Other (See Comments)    Unknown   I have reviewed the patient's current medications . aspirin EC  81 mg Oral Daily  . atenolol  25 mg Oral BID  . FLUoxetine  20 mg Oral Daily  . heparin  5,000 Units Subcutaneous Q8H  . levothyroxine  112 mcg Oral QAC breakfast  . losartan  100 mg Oral Daily  . sodium chloride flush  3 mL Intravenous Q12H   . sodium chloride 50 mL/hr at 01/23/17 [id number]   ALPRAZolam, traMADol  Prior to Admission medications   Medication Sig Start Date End Date Taking? Authorizing Provider  ALPRAZolam Duanne Moron) 0.5 MG tablet Take 0.5 mg by mouth 3 (three) times daily as needed for anxiety.   Yes Historical Provider, MD  Ascorbic Acid (VITAMIN C PO) Take 1 tablet by mouth daily.   Yes Historical Provider, MD  atenolol (TENORMIN) 25 MG tablet Take 25 mg by mouth 2 (two) times daily.    Yes Historical Provider, MD  cephALEXin (KEFLEX) 250 MG capsule Take 250  mg by mouth daily.   Yes Historical Provider, MD  Cyanocobalamin (VITAMIN B-12 PO) Take 1 tablet by mouth daily.   Yes Historical Provider, MD  diclofenac sodium (VOLTAREN) 1 % GEL Apply 1 application topically as needed (for pain).   Yes Historical Provider, MD  FLUoxetine (PROZAC) 20 MG tablet Take 20 mg by mouth daily.   Yes Historical Provider, MD  levothyroxine (SYNTHROID, LEVOTHROID) 112 MCG tablet Take 112 mcg by mouth daily before breakfast.   Yes Historical Provider, MD  losartan (COZAAR) 100 MG tablet Take 100 mg by mouth daily.    Yes Historical Provider, MD  Multiple Vitamins-Minerals (MULTIVITAMIN PO) Take 1 tablet by mouth daily.   Yes Historical Provider, MD    Omega-3 Fatty Acids (FISH OIL PO) Take 3 capsules by mouth daily.    Yes Historical Provider, MD  traMADol (ULTRAM) 50 MG tablet Take 50 mg by mouth every 6 (six) hours as needed for moderate pain.    Yes Historical Provider, MD  cefdinir (OMNICEF) 300 MG capsule Take 1 capsule (300 mg total) by mouth 2 (two) times daily. Patient not taking: Reported on 01/21/2017 06/20/16   Janne Napoleon, NP  oxybutynin (DITROPAN) 5 MG tablet Take 1 tablet (5 mg total) by mouth 3 (three) times daily as needed for bladder spasms. Patient not taking: Reported on 01/21/2017 06/30/16   Waldemar Dickens, MD     Social History   Social History  . Marital status: Widowed    Spouse name: N/A  . Number of children: N/A  . Years of education: N/A   Occupational History  . Retired    Social History Main Topics  . Smoking status: Never Smoker  . Smokeless tobacco: Never Used  . Alcohol use No  . Drug use: No  . Sexual activity: Not on file   Other Topics Concern  . Not on file   Social History Narrative   Pt in independent living apartment at [facility].    Family Status  Relation Status  . Mother Deceased  . Father Deceased  . Neg Hx    Family History  Problem Relation Age of Onset  . Coronary artery disease Neg Hx       ROS:  Full 14 point review of systems complete and found to be negative unless listed above.  Physical Exam: Blood pressure 139/70, pulse 71, temperature 98.3 F (36.8 C), temperature source Oral, resp. rate 18, height 5\' 10"  (1.778 m), weight 210 lb 6.4 oz (95.4 kg), SpO2 96 %.  General: Well developed, well nourished, female in no acute distress Head: Eyes PERRLA, No xanthomas.   Normocephalic and atraumatic, oropharynx without edema or exudate. Dentition:  Lungs: clear. Heart: HRRR S1 S2, no rub/gallop, Heart irregular rate and rhythm with S1, S2  murmur. pulses are 2+ extrem.   Neck: No carotid bruits. No lymphadenopathy.  JVD. Abdomen: Bowel sounds present, abdomen soft  and non-tender without masses or hernias noted. Msk:  No spine or cva tenderness. No weakness, no joint deformities or effusions. Extremities: No clubbing or cyanosis.  edema.  Neuro: Alert and oriented X 3. No focal deficits noted. Psych:  Good affect, responds appropriately Skin: No rashes or lesions noted.  Labs:   Lab Results  Component Value Date   WBC 4.4 01/23/2017   HGB 12.1 01/23/2017   HCT 37.0 01/23/2017   MCV 92.5 01/23/2017   PLT 191 01/23/2017   No results for input(s): INR in the last 72 hours.  Recent Labs Lab 01/21/17 1430  01/23/17 0530  NA 138  < > 139  K 4.6  < > 3.7  CL 102  < > 106  CO2 25  < > 27  BUN 19  < > 12  CREATININE 0.97  < > 0.70  CALCIUM 9.3  < > 9.0  PROT 6.4*  --   --   BILITOT 0.9  --   --   ALKPHOS 44  --   --   ALT 15  --   --   AST 25  --   --   GLUCOSE 93  < > 106*  ALBUMIN 3.9  --   --   < > = values in this interval not displayed. Magnesium  Date Value Ref Range Status  01/21/2017 1.9 1.7 - 2.4 mg/dL Final    Recent Labs  01/21/17 1801 01/21/17 2222 01/22/17 0517  TROPONINI <0.03  <0.03 <0.03  <0.03 <0.03    Recent Labs  01/21/17 1441  TROPIPOC 0.01   No results found for: PROBNP No results found for: CHOL, HDL, LDLCALC, TRIG Lab Results  Component Value Date   DDIMER 0.58 (H) 01/21/2017   No results found for: LIPASE, AMYLASE TSH  Date/Time Value Ref Range Status  01/21/2017 02:30 PM 0.380 0.350 - 4.500 uIU/mL Final    Comment:    Performed by a 3rd Generation assay with a functional sensitivity of <=0.01 uIU/mL.   Vitamin B-12  Date/Time Value Ref Range Status  01/21/2017 06:01 PM 615 180 - 914 pg/mL Final    Comment:    (NOTE) This assay is not validated for testing neonatal or myeloproliferative syndrome specimens for Vitamin B12 levels.     Echo:  Transthoracic Echocardiography 01/22/2017 ------------------------------------------------------------------- Study Conclusions  - Left  ventricle: The cavity size was normal. Wall thickness was   increased in a pattern of mild LVH. Systolic function was normal.   The estimated ejection fraction was in the range of 60% to 65%.   Left ventricular diastolic function parameters were normal. - Aortic valve: There was mild stenosis. There was mild   regurgitation. Valve area (VTI): 1.52 cm^2. Valve area (Vmax):   1.44 cm^2. Valve area (Vmean): 1.48 cm^2. - Mitral valve: There was mild regurgitation. - Left atrium: The atrium was mildly dilated. - Atrial septum: No defect or patent foramen ovale was identified.   ECG:  None since 01/21/17  Radiology:  Dg Chest 2 View  Result Date: 01/21/2017 CLINICAL DATA:  83 year old female with near syncope today. Weakness and lightheadedness. Initial encounter. EXAM: CHEST  2 VIEW COMPARISON:  None. FINDINGS: Large lung volumes. Mild tortuosity of the thoracic aorta. Calcified aortic atherosclerosis. Other mediastinal contours are within normal limits. Visualized tracheal air column is within normal limits. No pneumothorax, pulmonary edema, pleural effusion or consolidation. Small calcified granuloma in the left mid lung. Mild eventration of the diaphragm. Minimal streaky opacity at both lung bases most resembles scarring. Osteopenia. Degenerative changes in the lumbar spine. No acute osseous abnormality identified. IMPRESSION: 1. Pulmonary hyperinflation.  No acute cardiopulmonary abnormality. 2.  Calcified aortic atherosclerosis. Electronically Signed   By: Genevie Ann M.D.   On: 01/21/2017 14:41   Ct Head Wo Contrast  Result Date: 01/21/2017 CLINICAL DATA:  Headache with near syncope EXAM: CT HEAD WITHOUT CONTRAST TECHNIQUE: Contiguous axial images were obtained from the base of the skull through the vertex without intravenous contrast. COMPARISON:  None. FINDINGS: Brain: There is mild diffuse atrophy. There is no  intracranial mass, hemorrhage, extra-axial fluid collection, or midline shift. There is  mild small vessel disease in the centra semiovale bilaterally. Elsewhere gray-white compartments appear normal. No acute infarct evident. Vascular: No hyperdense vessel. There are foci of calcification in each carotid siphon region. Skull: The bony calvarium appears intact. Sinuses/Orbits: Visualized paranasal sinuses are clear. Orbits appear symmetric bilaterally. Other: Mastoid air cells are clear. IMPRESSION: Mild atrophy with mild periventricular small vessel disease. No intracranial mass, hemorrhage, or extra-axial fluid collection. No acute infarct evident. There are scattered foci of arterial vascular calcification. Electronically Signed   By: Lowella Grip III M.D.   On: 01/21/2017 15:06   Ct Angio Chest Pe W Or Wo Contrast  Result Date: 01/23/2017 CLINICAL DATA:  Syncopal episode.  Chest pain. EXAM: CT ANGIOGRAPHY CHEST WITH CONTRAST TECHNIQUE: Multidetector CT imaging of the chest was performed using the standard protocol during bolus administration of intravenous contrast. Multiplanar CT image reconstructions and MIPs were obtained to evaluate the vascular anatomy. CONTRAST:  80 mL Isovue 370 COMPARISON:  None. FINDINGS: Cardiovascular: Satisfactory opacification of pulmonary arteries noted, and no pulmonary emboli identified. An ascending thoracic aortic aneurysm is seen measuring 4.1 cm. No evidence of thoracic aortic dissection. Mild cardiomegaly. Mediastinum/Nodes: No masses or pathologically enlarged lymph nodes identified. Surgical clips from previous thyroidectomy. Lungs/Pleura: No pulmonary mass, infiltrate, or effusion. Mild bibasilar scarring. Upper abdomen: No acute findings. Musculoskeletal: No suspicious bone lesions or other significant abnormality identified. Review of the MIP images confirms the above findings. IMPRESSION: No radiographic evidence of pulmonary embolism or other acute findings. 4.1 cm ascending thoracic aortic aneurysm. Recommend annual imaging followup by CTA or  MRA. This recommendation follows 2010 ACCF/AHA/AATS/ACR/ASA/SCA/SCAI/SIR/STS/SVM Guidelines for the Diagnosis and Management of Patients with Thoracic Aortic Disease. Circulation. 2010; [id number] Electronically Signed   By: Earle Gell M.D.   On: 01/23/2017 10:06   Mr Brain Wo Contrast  Result Date: 01/22/2017 CLINICAL DATA:  82 year old female with headache, presyncope, unsteady gait. Initial encounter. EXAM: MRI HEAD WITHOUT CONTRAST TECHNIQUE: Multiplanar, multiecho pulse sequences of the brain and surrounding structures were obtained without intravenous contrast. COMPARISON:  Head CT without contrast 01/21/2017 FINDINGS: Brain: Cerebral volume is within normal limits for age. No restricted diffusion to suggest acute infarction. No midline shift, mass effect, evidence of mass lesion, ventriculomegaly, extra-axial collection or acute intracranial hemorrhage. Cervicomedullary junction and pituitary are within normal limits. Scattered fairly numerous foci of cerebral white matter T2 and FLAIR hyperintensity, but in a nonspecific configuration. Most are subcortical. No cortical encephalomalacia identified. No chronic cerebral blood products identified. Deep gray matter nuclei brainstem and cerebellum are within normal limits for age. Vascular: Major intracranial vascular flow voids are preserved with vertebrobasilar tortuosity. Skull and upper cervical spine: Cervical disc and endplate degeneration. Normal bone marrow signal. Sinuses/Orbits: Postoperative changes to both globes. Otherwise normal orbits soft tissues. Visualized paranasal sinuses and mastoids are stable and well pneumatized (trace inferior right mastoid effusion, significance doubtful). Other: Negative nasopharynx. Grossly normal visible internal auditory structures. Negative scalp soft tissues. IMPRESSION: 1.  No acute intracranial abnormality. 2. Mild to moderate for age nonspecific cerebral white matter signal changes, most commonly due to  chronic small vessel disease. Electronically Signed   By: Genevie Ann M.D.   On: 01/22/2017 18:46    ASSESSMENT AND PLAN:    Active Problems:   Syncope   Hypertension   Thoracic aortic aneurysm [facility]): a. 4.1 cm ascending thoracic aortic aneurysm 01/23/17  Near syncope - Unclear etiology, work up  being continued by internal medicine - Echo shows mild aortic stenosis/regurg 01/22/17  Chest pain -  not do any further chest pain since admission, with preserved EF 60-65% with no regional wall motion abnormalities, troponins negative 3 - pt reports being able to "feel" her a.fib before the syncope and in the ambulance. Has not had any further episodes since then. - Thoracic aortic aneurysm [facility]): a. 4.1 cm ascending thoracic aortic aneurysm 01/23/17  Hypothyroidism Synthroid, TSH within normal limits; internal medicine to manage.  Hypertension - She is not orthostatic, blood pressure is acceptable, continue with medication  History of A. Fib - Patient reports it has been remote, more than 10 years ago with no recurrence, will continue to monitor on telemetry - CHADVASc sore: > 75, female, hx of HTN. Pt meets criteria for initiating anticoagulation but she is blind in one eye with mildly unsteady gait. Denies having falls. Prefers not to be on anticoagulation as her husband was on blood thinners and dissected a AAA 2 years ago and passed away.  SignedLinus Mako, PA-C 01/23/2017 3:01 PM  Co-Sign MD  Pt seen and examined  I agree with finding as as noted above by Burr Medico Pt  83 yo with episode of chest tightness then near syncope on Saturday  She has been under considerable stress at retirement home (bed bugs) and has not rested  Sat didn't feelg good  Heavy  Tired  Then almost passd out  When EMS arrived init tele strips with atrial fib  I have reviewed these (12:33 on Sat)  She has not had any further spells   On exam patient comfortable  LUngs CTA  Cardiac RRR No S3  Gr II/VI  systolic murmur best at apex  Ext without edema  EKG without acute changes  Echo showed normal LVEF  Mild AS  Mild MR   Overall I think the pt should be on Eliquis 5 bid  Continue other meds  I would set up for an event monitor  To see if she has further spells or other rhythm issues I am not convinced event on Saturday an anginal equivalent  CT without signif coronary calcification but if symptoms pressure, fatigue recur/worsen then I would set up for a Lexiscan myovue.  WIll arrange for outpt f/u.  Dorris Carnes

## 2017-01-23 NOTE — Discharge Summary (Signed)
Faith Parker, is a 83 y.o. female  DOB 23-Feb-1930  MRN [id number].  Admission date:  01/21/2017  Admitting Physician  Albertine Patricia, MD  Discharge Date:  01/23/2017   Primary MD  No PCP Per Patient  Recommendations for primary care physician for things to follow:  - Please check CBC, BMP during next visit. -  Recommend annual imaging followup by CTA or MRA for 4.1 cm ascending thoracic aortic aneurysm.   Admission Diagnosis  Near syncope [R55]   Discharge Diagnosis  Near syncope [R55]   Active Problems:   Syncope   Hypertension   Thoracic aortic aneurysm [facility]): a. 4.1 cm ascending thoracic aortic aneurysm 01/23/17      Past Medical History:  Diagnosis Date  . Anxiety   . Aortic regurgitation   . Gastritis   . GERD (gastroesophageal reflux disease)   . H/O artificial eye lens   . Hypertension   . Hypothyroid   . Left ventricular hypertrophy   . Major depression   . Osteoarthritis   . Osteoporosis   . Peptic ulcer disease   . Stress incontinence     Past Surgical History:  Procedure Laterality Date  . LEG SURGERY Right    multiple surgeries, unknown type       History of present illness and  Hospital Course:     Kindly see H&P for history of present illness and admission details, please review complete Labs, Consult reports and Test reports for all details in brief  HPI  from the history and physical done on the day of admission 01/21/2017   Faith Parker  is a 83 y.o. female, Past medical history of hypertension, hypothyroidism, history of atrial fibrillation(she reports more than 10 years ago, was never anticoagulation), patient recently moved from [place] last July, a shunt presents with near syncope, patient reports she was walking towards the elevator, then she felt lightheaded, felt almost like passing out, but someone brought her a chair where she sat on, reports she felt  better after a few seconds, denies any total loss of consciousness, fall or head trauma, she reports chest tightness a few seconds. To this episode, she denies any shortness of breath, palpitation or diaphoresis, she reports dull headache this a.m.. In ED patient was not orthostatic, no significant lab abnormalities, EKG in sinus rhythm, first troponin is negative, CT head with no acute finding, patient with murmur on physical exam(H she reports it has been known in the past), I was called to admit.  Hospital Course    Near syncope - Patient is not orthostatic, no significant lab abnormalities, CT head with no acute findings, and she is with significant murmur , 2-D echo showing only mild aortic stenosis/regurgitation, has history of questionable history of A. fib, but so far no evidence of arrhythmias on telemetry , TSH within normal limit, B-12 within normal limits, MRI brain with no acute findings, CTA chest to rule out PE obtained given mildly elevated d-dimer and chest pain preceding presyncope, with no evidence  of PE. - Cardiology  will arrange for event monitor as an outpatient  Chest pain -  not do any further chest pain since admission, with preserved EF 60-65% with no regional wall motion abnormalities, troponins negative 3 - CTA chest with no evidence of PE  4.1 cm ascending thoracic aortic aneurysm on CTA chest - Patient will need annual follow-up imaging with CTA or MRA  Hypothyroidism - cont with Synthroid, TSH within normal limits  Hypertension - She is not orthostatic, blood pressure is acceptable,  continue withme medication  History of A. Fib - Cardiology consult appreciated, CHADVASc 2 score of 4, started on Eliquis   Discharge Condition:  Stable   Follow UP  [place], MD Follow up in 1 week(s).   Specialty:  Family Medicine Contact information: [place] [address] [phone]         Dorris Carnes, MD Follow up.   Specialty:  Cardiology Why:  you wil be called with appointment Contact information: [address] [place] [address] [phone]          stable   Discharge Instructions  and  Discharge Medications    Discharge Instructions    Discharge instructions    Complete by:  As directed    Follow with Primary MD No PCP Per Patient in 7 days   Get CBC, CMP,  checked  by Primary MD next visit.    Activity: As tolerated with Full fall precautions use walker/cane & assistance as needed   Disposition Home   Diet: Heart Healthy , with feeding assistance and aspiration precautions.   On your next visit with your primary care physician please Get Medicines reviewed and adjusted.   Please request your Prim.MD to go over all Hospital Tests and Procedure/Radiological results at the follow up, please get all Hospital records sent to your Prim MD by signing hospital release before you go home.   If you experience worsening of your admission symptoms, develop shortness of breath, life threatening emergency, suicidal or homicidal thoughts you must seek medical attention immediately by calling 911 or calling your MD immediately  if symptoms less severe.  You Must read complete instructions/literature along with all the possible adverse reactions/side effects for all the Medicines you take and that have been prescribed to you. Take any new Medicines after you have completely understood and accpet all the possible adverse reactions/side effects.   Do not drive, operating heavy machinery, perform activities at heights, swimming or participation in water activities or provide baby sitting services if your were admitted for syncope or siezures until you have seen by Primary MD or a Neurologist and advised to do so again.  Do not drive when taking Pain medications.    Do not take more than prescribed Pain, Sleep and Anxiety Medications  Special  Instructions: If you have smoked or chewed Tobacco  in the last 2 yrs please stop smoking, stop any regular Alcohol  and or any Recreational drug use.  Wear Seat belts while driving.   Please note  You were cared for by a hospitalist during your hospital stay. If you have any questions about your discharge medications or the care you received while you were in the hospital after you are discharged, you can call the unit and asked to speak with the hospitalist on call if the hospitalist that took care of you is not available. Once you are discharged, your primary care  physician will handle any further medical issues. Please note that NO REFILLS for any discharge medications will be authorized once you are discharged, as it is imperative that you return to your primary care physician (or establish a relationship with a primary care physician if you do not have one) for your aftercare needs so that they can reassess your need for medications and monitor your lab values.   Increase activity slowly    Complete by:  As directed      Allergies as of 01/23/2017      Reactions   Ciprofloxacin Other (See Comments)   Unknown   Latex Other (See Comments)   Unknown   Levofloxacin Other (See Comments)   Unknown   Macrobid [nitrofurantoin Monohyd Macro] Other (See Comments)   Unknown   Nitrofurantoin Other (See Comments)   Unknown   Sulfa Antibiotics Other (See Comments)   Unknown      Medication List    STOP taking these medications   cefdinir 300 MG capsule Commonly known as:  OMNICEF   cephALEXin 250 MG capsule Commonly known as:  KEFLEX   oxybutynin 5 MG tablet Commonly known as:  DITROPAN     TAKE these medications   ALPRAZolam 0.5 MG tablet Commonly known as:  XANAX Take 0.5 mg by mouth 3 (three) times daily as needed for anxiety.   apixaban 5 MG Tabs tablet Commonly known as:  ELIQUIS Take 1 tablet (5 mg total) by mouth 2 (two) times daily.   atenolol 25 MG tablet Commonly  known as:  TENORMIN Take 25 mg by mouth 2 (two) times daily.   diclofenac sodium 1 % Gel Commonly known as:  VOLTAREN Apply 1 application topically as needed (for pain).   FISH OIL PO Take 3 capsules by mouth daily.   FLUoxetine 20 MG tablet Commonly known as:  PROZAC Take 20 mg by mouth daily.   levothyroxine 112 MCG tablet Commonly known as:  SYNTHROID, LEVOTHROID Take 112 mcg by mouth daily before breakfast.   losartan 100 MG tablet Commonly known as:  COZAAR Take 100 mg by mouth daily.   MULTIVITAMIN PO Take 1 tablet by mouth daily.   traMADol 50 MG tablet Commonly known as:  ULTRAM Take 50 mg by mouth every 6 (six) hours as needed for moderate pain.   VITAMIN B-12 PO Take 1 tablet by mouth daily.   VITAMIN C PO Take 1 tablet by mouth daily.         Diet and Activity recommendation: See Discharge Instructions above   Consults obtained -  Cardiology Dr Harrington Challenger    Major procedures and Radiology Reports - PLEASE review detailed and final reports for all details, in brief -     Dg Chest 2 View  Result Date: 01/21/2017 CLINICAL DATA:  83 year old female with near syncope today. Weakness and lightheadedness. Initial encounter. EXAM: CHEST  2 VIEW COMPARISON:  None. FINDINGS: Large lung volumes. Mild tortuosity of the thoracic aorta. Calcified aortic atherosclerosis. Other mediastinal contours are within normal limits. Visualized tracheal air column is within normal limits. No pneumothorax, pulmonary edema, pleural effusion or consolidation. Small calcified granuloma in the left mid lung. Mild eventration of the diaphragm. Minimal streaky opacity at both lung bases most resembles scarring. Osteopenia. Degenerative changes in the lumbar spine. No acute osseous abnormality identified. IMPRESSION: 1. Pulmonary hyperinflation.  No acute cardiopulmonary abnormality. 2.  Calcified aortic atherosclerosis. Electronically Signed   By: Genevie Ann M.D.   On: 01/21/2017 14:41  Ct  Head Wo Contrast  Result Date: 01/21/2017 CLINICAL DATA:  Headache with near syncope EXAM: CT HEAD WITHOUT CONTRAST TECHNIQUE: Contiguous axial images were obtained from the base of the skull through the vertex without intravenous contrast. COMPARISON:  None. FINDINGS: Brain: There is mild diffuse atrophy. There is no intracranial mass, hemorrhage, extra-axial fluid collection, or midline shift. There is mild small vessel disease in the centra semiovale bilaterally. Elsewhere gray-white compartments appear normal. No acute infarct evident. Vascular: No hyperdense vessel. There are foci of calcification in each carotid siphon region. Skull: The bony calvarium appears intact. Sinuses/Orbits: Visualized paranasal sinuses are clear. Orbits appear symmetric bilaterally. Other: Mastoid air cells are clear. IMPRESSION: Mild atrophy with mild periventricular small vessel disease. No intracranial mass, hemorrhage, or extra-axial fluid collection. No acute infarct evident. There are scattered foci of arterial vascular calcification. Electronically Signed   By: Lowella Grip III M.D.   On: 01/21/2017 15:06   Ct Angio Chest Pe W Or Wo Contrast  Result Date: 01/23/2017 CLINICAL DATA:  Syncopal episode.  Chest pain. EXAM: CT ANGIOGRAPHY CHEST WITH CONTRAST TECHNIQUE: Multidetector CT imaging of the chest was performed using the standard protocol during bolus administration of intravenous contrast. Multiplanar CT image reconstructions and MIPs were obtained to evaluate the vascular anatomy. CONTRAST:  80 mL Isovue 370 COMPARISON:  None. FINDINGS: Cardiovascular: Satisfactory opacification of pulmonary arteries noted, and no pulmonary emboli identified. An ascending thoracic aortic aneurysm is seen measuring 4.1 cm. No evidence of thoracic aortic dissection. Mild cardiomegaly. Mediastinum/Nodes: No masses or pathologically enlarged lymph nodes identified. Surgical clips from previous thyroidectomy. Lungs/Pleura: No  pulmonary mass, infiltrate, or effusion. Mild bibasilar scarring. Upper abdomen: No acute findings. Musculoskeletal: No suspicious bone lesions or other significant abnormality identified. Review of the MIP images confirms the above findings. IMPRESSION: No radiographic evidence of pulmonary embolism or other acute findings. 4.1 cm ascending thoracic aortic aneurysm. Recommend annual imaging followup by CTA or MRA. This recommendation follows 2010 ACCF/AHA/AATS/ACR/ASA/SCA/SCAI/SIR/STS/SVM Guidelines for the Diagnosis and Management of Patients with Thoracic Aortic Disease. Circulation. 2010; [id number] Electronically Signed   By: Earle Gell M.D.   On: 01/23/2017 10:06   Mr Brain Wo Contrast  Result Date: 01/22/2017 CLINICAL DATA:  83 year old female with headache, presyncope, unsteady gait. Initial encounter. EXAM: MRI HEAD WITHOUT CONTRAST TECHNIQUE: Multiplanar, multiecho pulse sequences of the brain and surrounding structures were obtained without intravenous contrast. COMPARISON:  Head CT without contrast 01/21/2017 FINDINGS: Brain: Cerebral volume is within normal limits for age. No restricted diffusion to suggest acute infarction. No midline shift, mass effect, evidence of mass lesion, ventriculomegaly, extra-axial collection or acute intracranial hemorrhage. Cervicomedullary junction and pituitary are within normal limits. Scattered fairly numerous foci of cerebral white matter T2 and FLAIR hyperintensity, but in a nonspecific configuration. Most are subcortical. No cortical encephalomalacia identified. No chronic cerebral blood products identified. Deep gray matter nuclei brainstem and cerebellum are within normal limits for age. Vascular: Major intracranial vascular flow voids are preserved with vertebrobasilar tortuosity. Skull and upper cervical spine: Cervical disc and endplate degeneration. Normal bone marrow signal. Sinuses/Orbits: Postoperative changes to both globes. Otherwise normal orbits  soft tissues. Visualized paranasal sinuses and mastoids are stable and well pneumatized (trace inferior right mastoid effusion, significance doubtful). Other: Negative nasopharynx. Grossly normal visible internal auditory structures. Negative scalp soft tissues. IMPRESSION: 1.  No acute intracranial abnormality. 2. Mild to moderate for age nonspecific cerebral white matter signal changes, most commonly due to chronic small vessel disease. Electronically Signed  By: Genevie Ann M.D.   On: 01/22/2017 18:46    Micro Results     No results found for this or any previous visit (from the past 240 hour(s)).     Today   Subjective:   Lajoya Mcmeans today has no headache,no chest or abdominal pain,no new weakness tingling or numbness, feels much better wants to go home today.   Objective:   Blood pressure 139/70, pulse 71, temperature 98.3 F (36.8 C), temperature source Oral, resp. rate 18, height 5\' 10"  (1.778 m), weight 95.4 kg (210 lb 6.4 oz), SpO2 96 %.   Intake/Output Summary (Last 24 hours) at 01/23/17 1614 Last data filed at 01/23/17 1539  Gross per 24 hour  Intake          1362.17 ml  Output              100 ml  Net          1262.17 ml    Exam Awake Alert, Oriented x 3, No new F.N deficits, Normal affect Supple Neck,No JVD,  Symmetrical Chest wall movement, Good air movement bilaterally, CTAB RRR,No Gallops,Rubs, No Parasternal Heave, +SEM +ve B.Sounds, Abd Soft, Non tender, No rebound -guarding or rigidity. No Cyanosis, Clubbing or edema, No new Rash or bruise  Data Review   CBC w Diff:  Lab Results  Component Value Date   WBC 4.4 01/23/2017   HGB 12.1 01/23/2017   HCT 37.0 01/23/2017   PLT 191 01/23/2017   LYMPHOPCT 18 01/21/2017   MONOPCT 7 01/21/2017   EOSPCT 2 01/21/2017   BASOPCT 1 01/21/2017    CMP:  Lab Results  Component Value Date   NA 139 01/23/2017   K 3.7 01/23/2017   CL 106 01/23/2017   CO2 27 01/23/2017   BUN 12 01/23/2017   CREATININE 0.70  01/23/2017   PROT 6.4 (L) 01/21/2017   ALBUMIN 3.9 01/21/2017   BILITOT 0.9 01/21/2017   ALKPHOS 44 01/21/2017   AST 25 01/21/2017   ALT 15 01/21/2017  .   Total Time in preparing paper work, data evaluation and todays exam - 35 minutes  Nashon Erbes M.D on 01/23/2017 at [facility]   Office  [phone]

## 2017-01-30 ENCOUNTER — Ambulatory Visit (INDEPENDENT_AMBULATORY_CARE_PROVIDER_SITE_OTHER): Payer: Medicare Other

## 2017-01-30 DIAGNOSIS — I350 Nonrheumatic aortic (valve) stenosis: Secondary | ICD-10-CM | POA: Diagnosis not present

## 2017-01-30 DIAGNOSIS — R55 Syncope and collapse: Secondary | ICD-10-CM | POA: Diagnosis not present

## 2017-01-30 DIAGNOSIS — I712 Thoracic aortic aneurysm, without rupture: Secondary | ICD-10-CM | POA: Diagnosis not present

## 2017-01-30 DIAGNOSIS — I351 Nonrheumatic aortic (valve) insufficiency: Secondary | ICD-10-CM | POA: Diagnosis not present

## 2017-01-30 DIAGNOSIS — I480 Paroxysmal atrial fibrillation: Secondary | ICD-10-CM | POA: Diagnosis not present

## 2017-02-21 ENCOUNTER — Telehealth: Payer: Self-pay | Admitting: Internal Medicine

## 2017-02-21 NOTE — Telephone Encounter (Signed)
Left message for patient to call back  

## 2017-02-21 NOTE — Telephone Encounter (Signed)
New Message:   Pt wants to know if she needs to continue taking Eliquis?

## 2017-02-23 ENCOUNTER — Other Ambulatory Visit: Payer: Self-pay | Admitting: *Deleted

## 2017-02-23 ENCOUNTER — Telehealth: Payer: Self-pay | Admitting: Internal Medicine

## 2017-02-23 MED ORDER — APIXABAN 5 MG PO TABS: 5.0000 mg | ORAL_TABLET | Freq: Two times a day (BID) | ORAL | 0 refills | Status: DC

## 2017-02-23 NOTE — Telephone Encounter (Signed)
Patient wearing event monitor, returning it on 3/28.  Usually can tell if she has problems and has not had any problems.  Wanted to know if she should stop eliquis or need to continue.  Will send refill to [facility], advised she needs to continue, at least until her appointment with Dr. Harrington Challenger on 03/13/17.  Pt very appreciative for the information.

## 2017-02-23 NOTE — Telephone Encounter (Signed)
New Message  Pt voiced wanting to speak with nurse and wanting to know which medication she'll be changed too after her device is removed.  Please f/u

## 2017-02-23 NOTE — Telephone Encounter (Signed)
Left message on both numbers to call back 

## 2017-03-01 ENCOUNTER — Encounter: Payer: Self-pay | Admitting: Internal Medicine

## 2017-03-09 ENCOUNTER — Telehealth: Payer: Self-pay | Admitting: Internal Medicine

## 2017-03-09 NOTE — Telephone Encounter (Signed)
Informed patient of monitor results and to continue Eliquis.  Appointment made for 05/05/17.  Pt was already scheduled for 4/9 and had to cancel due to no transportation.  She will need a refill for Eliquis and is going to check to see if a 90 day supply is a better price.  I advised to call insurance company to see if a mail order option is a better price.

## 2017-03-09 NOTE — Telephone Encounter (Signed)
New message     Pt has questions about her Eliquis and the heart monitor she just wore , she could not keep her Monday appt

## 2017-03-13 ENCOUNTER — Ambulatory Visit: Payer: Medicare Other | Admitting: Internal Medicine

## 2017-03-23 ENCOUNTER — Other Ambulatory Visit: Payer: Self-pay | Admitting: Internal Medicine

## 2017-03-23 DIAGNOSIS — Z79899 Other long term (current) drug therapy: Secondary | ICD-10-CM | POA: Diagnosis not present

## 2017-03-23 DIAGNOSIS — M810 Age-related osteoporosis without current pathological fracture: Secondary | ICD-10-CM | POA: Diagnosis not present

## 2017-03-23 DIAGNOSIS — E669 Obesity, unspecified: Secondary | ICD-10-CM | POA: Diagnosis not present

## 2017-03-23 DIAGNOSIS — E559 Vitamin D deficiency, unspecified: Secondary | ICD-10-CM | POA: Diagnosis not present

## 2017-03-23 DIAGNOSIS — M5136 Other intervertebral disc degeneration, lumbar region: Secondary | ICD-10-CM | POA: Diagnosis not present

## 2017-03-23 DIAGNOSIS — Z6831 Body mass index (BMI) 31.0-31.9, adult: Secondary | ICD-10-CM | POA: Diagnosis not present

## 2017-03-23 DIAGNOSIS — M150 Primary generalized (osteo)arthritis: Secondary | ICD-10-CM | POA: Diagnosis not present

## 2017-03-24 ENCOUNTER — Other Ambulatory Visit: Payer: Self-pay | Admitting: Internal Medicine

## 2017-03-24 DIAGNOSIS — M810 Age-related osteoporosis without current pathological fracture: Secondary | ICD-10-CM

## 2017-03-27 DIAGNOSIS — I480 Paroxysmal atrial fibrillation: Secondary | ICD-10-CM | POA: Diagnosis not present

## 2017-03-27 DIAGNOSIS — I10 Essential (primary) hypertension: Secondary | ICD-10-CM | POA: Diagnosis not present

## 2017-03-27 DIAGNOSIS — Z0000 Encounter for general adult medical examination without abnormal findings: Secondary | ICD-10-CM | POA: Diagnosis not present

## 2017-03-27 DIAGNOSIS — E559 Vitamin D deficiency, unspecified: Secondary | ICD-10-CM | POA: Diagnosis not present

## 2017-03-27 DIAGNOSIS — I350 Nonrheumatic aortic (valve) stenosis: Secondary | ICD-10-CM | POA: Diagnosis not present

## 2017-03-27 DIAGNOSIS — F339 Major depressive disorder, recurrent, unspecified: Secondary | ICD-10-CM | POA: Diagnosis not present

## 2017-03-27 DIAGNOSIS — Z79899 Other long term (current) drug therapy: Secondary | ICD-10-CM | POA: Diagnosis not present

## 2017-03-27 DIAGNOSIS — F419 Anxiety disorder, unspecified: Secondary | ICD-10-CM | POA: Diagnosis not present

## 2017-03-27 DIAGNOSIS — Z1389 Encounter for screening for other disorder: Secondary | ICD-10-CM | POA: Diagnosis not present

## 2017-03-27 DIAGNOSIS — K219 Gastro-esophageal reflux disease without esophagitis: Secondary | ICD-10-CM | POA: Diagnosis not present

## 2017-03-27 DIAGNOSIS — Z13220 Encounter for screening for lipoid disorders: Secondary | ICD-10-CM | POA: Diagnosis not present

## 2017-04-03 DIAGNOSIS — R350 Frequency of micturition: Secondary | ICD-10-CM | POA: Diagnosis not present

## 2017-04-03 DIAGNOSIS — N3946 Mixed incontinence: Secondary | ICD-10-CM | POA: Diagnosis not present

## 2017-04-19 ENCOUNTER — Ambulatory Visit
Admission: RE | Admit: 2017-04-19 | Discharge: 2017-04-19 | Disposition: A | Discharge status: Home / Self Care | Payer: Medicare Other | Source: Ambulatory Visit | Attending: Internal Medicine | Admitting: Internal Medicine

## 2017-04-19 DIAGNOSIS — M810 Age-related osteoporosis without current pathological fracture: Secondary | ICD-10-CM

## 2017-04-19 DIAGNOSIS — Z780 Asymptomatic menopausal state: Secondary | ICD-10-CM | POA: Diagnosis not present

## 2017-04-19 DIAGNOSIS — M85851 Other specified disorders of bone density and structure, right thigh: Secondary | ICD-10-CM | POA: Diagnosis not present

## 2017-05-04 NOTE — Progress Notes (Signed)
Cardiology Office Note   Date:  05/05/2017   ID:  Faith Parker, DOB [birth year], MRN [id number]  PCP:  Leighton Ruff, MD  Cardiologist:   Dorris Carnes, MD   F/U of paroxysmal atrial fib      History of Present Illness: Faith Parker is a 83 y.o. female with a history of HTN, Aortic insuff, hypothyroids.  I saw her back in feb  Episode of chest tightness and nearsyncope  EMS tele strips with atrial fib.  MRI nega  Echo with mild AS, AI.  CT neg for PE  Aorta 4.1 cm   Since d/c she has denies palpitations  She is fatigued often  Not very active  Hurt foot  Weight up   Denies dizziness    Current Meds  Medication Sig  . ALPRAZolam (XANAX) 0.5 MG tablet Take 0.5 mg by mouth 3 (three) times daily as needed for anxiety.  Marland Kitchen apixaban (ELIQUIS) 5 MG TABS tablet Take 1 tablet by mouth twice daily  . Ascorbic Acid (VITAMIN C PO) Take 1 tablet by mouth daily.  Marland Kitchen atenolol (TENORMIN) 25 MG tablet Take 25 mg by mouth 2 (two) times daily.   . cephALEXin (KEFLEX) 250 MG capsule Take 250 mg by mouth daily.   . Cyanocobalamin (VITAMIN B-12 PO) Take 1 tablet by mouth daily.  Marland Kitchen FLUoxetine (PROZAC) 20 MG tablet Take 20 mg by mouth daily.  Marland Kitchen levothyroxine (SYNTHROID, LEVOTHROID) 112 MCG tablet Take 112 mcg by mouth daily before breakfast.  . losartan (COZAAR) 100 MG tablet Take 100 mg by mouth daily.   . Multiple Vitamins-Minerals (MULTIVITAMIN PO) Take 1 tablet by mouth daily.  . Omega-3 Fatty Acids (FISH OIL PO) Take 3 capsules by mouth daily.   . traMADol (ULTRAM) 50 MG tablet Take 50 mg by mouth every 6 (six) hours as needed for moderate pain.      Allergies:   Ciprofloxacin; Latex; Levofloxacin; Macrobid [nitrofurantoin monohyd macro]; Nitrofurantoin; and Sulfa antibiotics   Past Medical History:  Diagnosis Date  . Anxiety   . Aortic regurgitation   . Gastritis   . GERD (gastroesophageal reflux disease)   . H/O artificial eye lens   . Hypertension   . Hypothyroid   . Left  ventricular hypertrophy   . Major depression   . Osteoarthritis   . Osteoporosis   . Peptic ulcer disease   . Stress incontinence     Past Surgical History:  Procedure Laterality Date  . LEG SURGERY Right    multiple surgeries, unknown type     Social History:  The patient  reports that she has never smoked. She has never used smokeless tobacco. She reports that she does not drink alcohol or use drugs.   Family History:  The patient's family history is not on file.    ROS:  Please see the history of present illness. All other systems are reviewed and  Negative to the above problem except as noted.    PHYSICAL EXAM: VS:  BP 126/66   Pulse 72   Ht 5\' 10"  (1.778 m)   Wt 95 kg (209 lb 6.4 oz)   SpO2 95%   BMI 30.05 kg/m   GEN: Well nourished, well developed, in no acute distress  HEENT: normal  Neck: no JVD, carotid bruits, or masses Cardiac: RRR; no murmurs, rubs, or gallops,no edema  Respiratory:  clear to auscultation bilaterally, normal work of breathing GI: soft, nontender, nondistended, + BS  No hepatomegaly  MS: no  deformity Moving all extremities   Skin: warm and dry, no rash Neuro:  Strength and sensation are intact Psych: euthymic mood, full affect   EKG:  EKG is not ordered today.   Lipid Panel No results found for: CHOL, TRIG, HDL, CHOLHDL, VLDL, LDLCALC, LDLDIRECT    Wt Readings from Last 3 Encounters:  05/05/17 95 kg (209 lb 6.4 oz)  01/23/17 95.4 kg (210 lb 6.4 oz)  06/20/16 86.2 kg (190 lb)      ASSESSMENT AND PLAN:  1  PAF  I would keep on Eliquis  F/U clinically Check CBC and BMET    2  HTN  BP is OK  Keep on current meds    3  Fatigue  I am not convinced cardia  Follow   4  Dizzinesss  BP is OK    I encouraged her to remain active    WIll f/U in October 2018     Current medicines are reviewed at length with the patient today.  The patient does not have concerns regarding medicines.  Signed, Dorris Carnes, MD  05/05/2017 11:55 PM     Faith Parker, [place], [place]  [address] Phone: [phone]; Fax: [phone]

## 2017-05-05 ENCOUNTER — Ambulatory Visit (INDEPENDENT_AMBULATORY_CARE_PROVIDER_SITE_OTHER): Payer: Medicare Other | Admitting: Internal Medicine

## 2017-05-05 ENCOUNTER — Encounter: Payer: Self-pay | Admitting: Internal Medicine

## 2017-05-05 ENCOUNTER — Encounter (INDEPENDENT_AMBULATORY_CARE_PROVIDER_SITE_OTHER): Payer: Self-pay

## 2017-05-05 VITALS — BP 126/66 | HR 72 | Ht 70.0 in | Wt 209.4 lb

## 2017-05-05 DIAGNOSIS — R42 Dizziness and giddiness: Secondary | ICD-10-CM | POA: Diagnosis not present

## 2017-05-05 DIAGNOSIS — I480 Paroxysmal atrial fibrillation: Secondary | ICD-10-CM

## 2017-05-05 DIAGNOSIS — I10 Essential (primary) hypertension: Secondary | ICD-10-CM

## 2017-05-05 NOTE — Patient Instructions (Signed)
Your physician recommends that you continue on your current medications as directed. Please refer to the Current Medication list given to you today.  Your physician recommends that you return for lab work today (BMET, CBC)  Your physician wants you to follow-up in: October, 2018 with Dr. Harrington Challenger.  You will receive a reminder letter in the mail two months in advance. If you don't receive a letter, please call our office to schedule the follow-up appointment.

## 2017-05-06 LAB — CBC
Hematocrit: 37.8 % (ref 34.0–46.6)
Hemoglobin: 12.6 g/dL (ref 11.1–15.9)
MCH: 30.4 pg (ref 26.6–33.0)
MCHC: 33.3 g/dL (ref 31.5–35.7)
MCV: 91 fL (ref 79–97)
PLATELETS: 215 10*3/uL (ref 150–379)
RBC: 4.14 x10E6/uL (ref 3.77–5.28)
RDW: 13.8 % (ref 12.3–15.4)
WBC: 4.6 10*3/uL (ref 3.4–10.8)

## 2017-05-06 LAB — BASIC METABOLIC PANEL
BUN / CREAT RATIO: 17 (ref 12–28)
BUN: 14 mg/dL (ref 8–27)
CO2: 24 mmol/L (ref 18–29)
CREATININE: 0.83 mg/dL (ref 0.57–1.00)
Calcium: 9.4 mg/dL (ref 8.7–10.3)
Chloride: 99 mmol/L (ref 96–106)
GFR calc Af Amer: 74 mL/min/{1.73_m2} (ref >59)
GFR calc non Af Amer: 65 mL/min/{1.73_m2} (ref >59)
GLUCOSE: 75 mg/dL (ref 65–99)
POTASSIUM: 4.9 mmol/L (ref 3.5–5.2)
SODIUM: 136 mmol/L (ref 134–144)

## 2017-07-13 ENCOUNTER — Other Ambulatory Visit: Payer: Self-pay | Admitting: Internal Medicine

## 2017-07-26 DIAGNOSIS — D3131 Benign neoplasm of right choroid: Secondary | ICD-10-CM | POA: Diagnosis not present

## 2017-07-26 DIAGNOSIS — H1859 Other hereditary corneal dystrophies: Secondary | ICD-10-CM | POA: Diagnosis not present

## 2017-07-26 DIAGNOSIS — T86841 Corneal transplant failure: Secondary | ICD-10-CM | POA: Diagnosis not present

## 2017-08-01 DIAGNOSIS — E039 Hypothyroidism, unspecified: Secondary | ICD-10-CM | POA: Diagnosis not present

## 2017-09-13 DIAGNOSIS — Z23 Encounter for immunization: Secondary | ICD-10-CM | POA: Diagnosis not present

## 2017-09-26 DIAGNOSIS — Z79899 Other long term (current) drug therapy: Secondary | ICD-10-CM | POA: Diagnosis not present

## 2017-09-26 DIAGNOSIS — E669 Obesity, unspecified: Secondary | ICD-10-CM | POA: Diagnosis not present

## 2017-09-26 DIAGNOSIS — Z6832 Body mass index (BMI) 32.0-32.9, adult: Secondary | ICD-10-CM | POA: Diagnosis not present

## 2017-09-26 DIAGNOSIS — M810 Age-related osteoporosis without current pathological fracture: Secondary | ICD-10-CM | POA: Diagnosis not present

## 2017-09-26 DIAGNOSIS — M5136 Other intervertebral disc degeneration, lumbar region: Secondary | ICD-10-CM | POA: Diagnosis not present

## 2017-09-26 DIAGNOSIS — M150 Primary generalized (osteo)arthritis: Secondary | ICD-10-CM | POA: Diagnosis not present

## 2017-09-26 DIAGNOSIS — E559 Vitamin D deficiency, unspecified: Secondary | ICD-10-CM | POA: Diagnosis not present

## 2017-10-02 DIAGNOSIS — M810 Age-related osteoporosis without current pathological fracture: Secondary | ICD-10-CM | POA: Diagnosis not present

## 2017-10-18 DIAGNOSIS — N390 Urinary tract infection, site not specified: Secondary | ICD-10-CM | POA: Diagnosis not present

## 2017-10-18 DIAGNOSIS — N3946 Mixed incontinence: Secondary | ICD-10-CM | POA: Diagnosis not present

## 2017-10-18 IMAGING — CT CT HEAD W/O CM
3 of 4 series · 16 of 47 positions shown, 19 images · non-contrast
Comparison: None.

CLINICAL DATA: Headache with near syncope

EXAM:
CT HEAD WITHOUT CONTRAST
TECHNIQUE: Contiguous axial images were obtained from the base of the skull
through the vertex without intravenous contrast.

[Series 201: head w/o, idose (1) · axial · non-contrast · 0.49mm/px · z∈[+189,+324]mm · 10 of 33 slices shown, 13 images]
[im 3/33  brain]
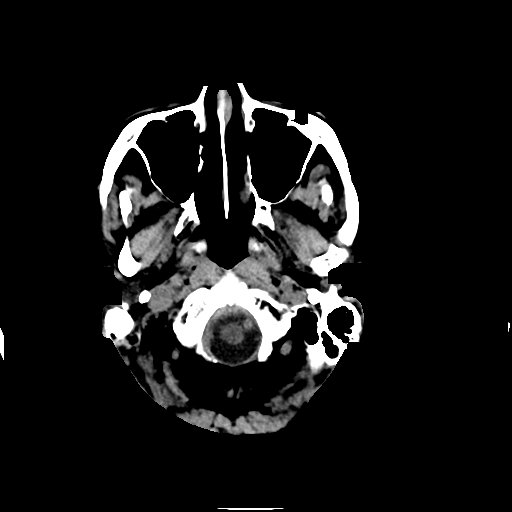
[im 3/33  bone]
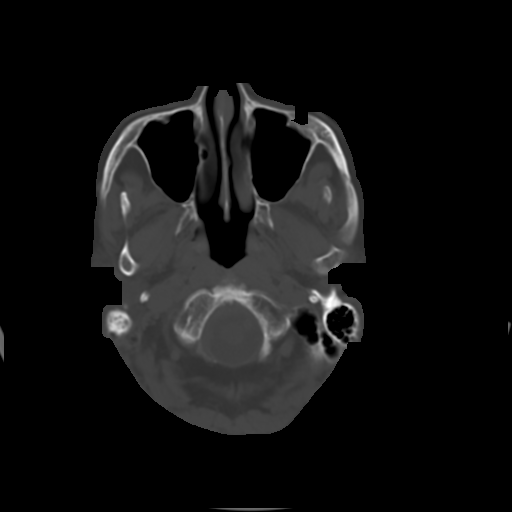
[im 5/33  brain]
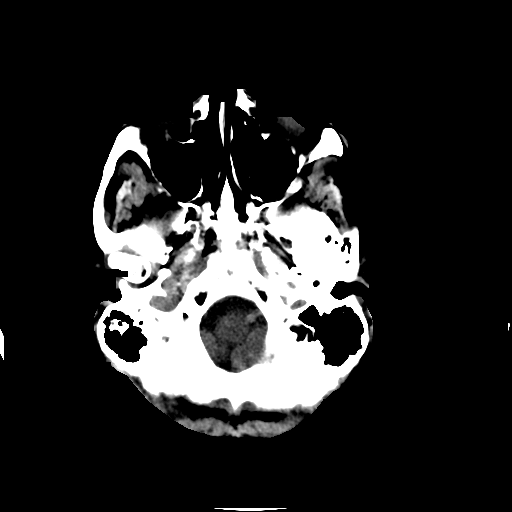
[im 10/33  brain]
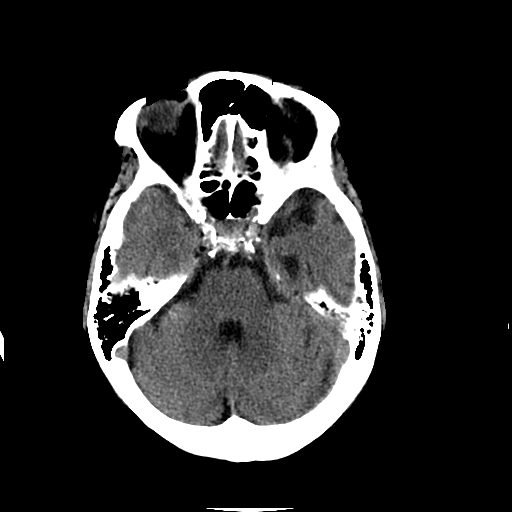
[im 12/33  brain]
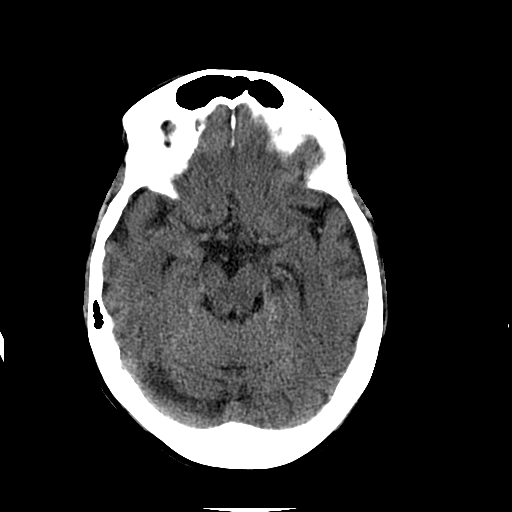
[im 14/33  brain]
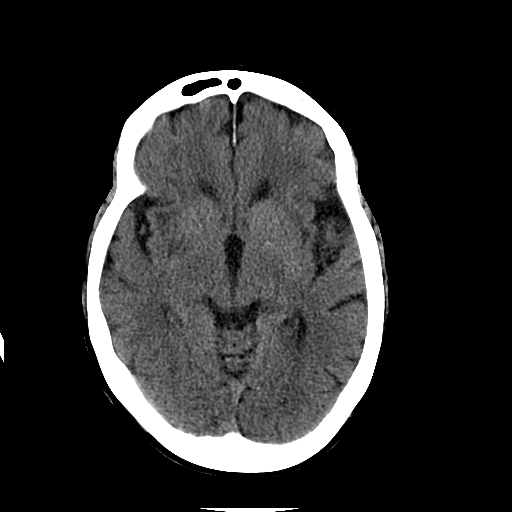
[im 14/33  bone]
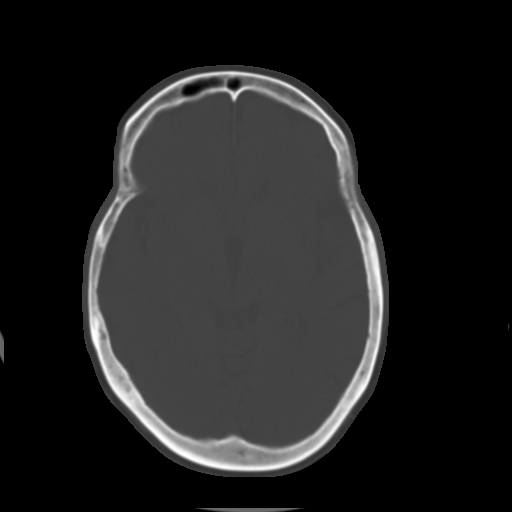
[im 19/33  brain]
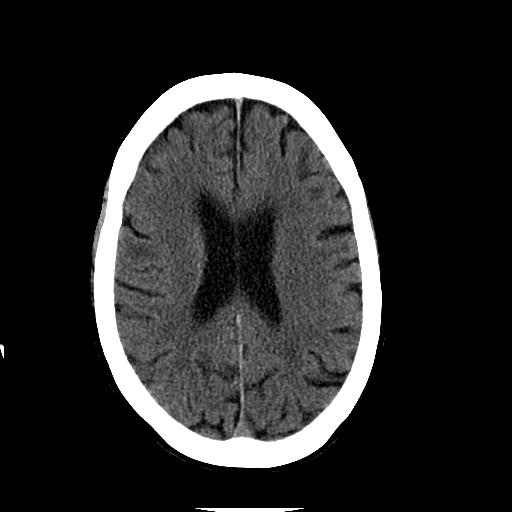
[im 21/33  brain]
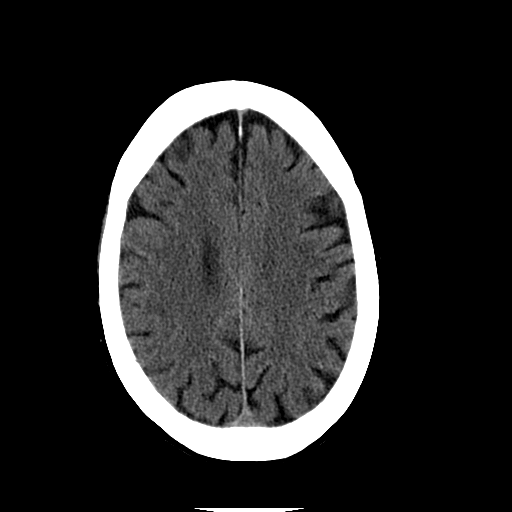
[im 23/33  brain]
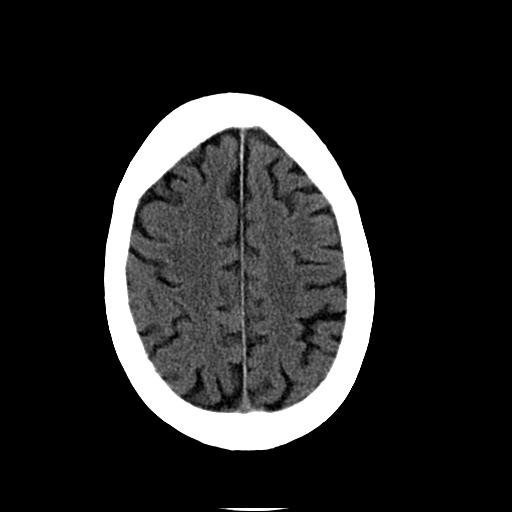
[im 28/33  brain]
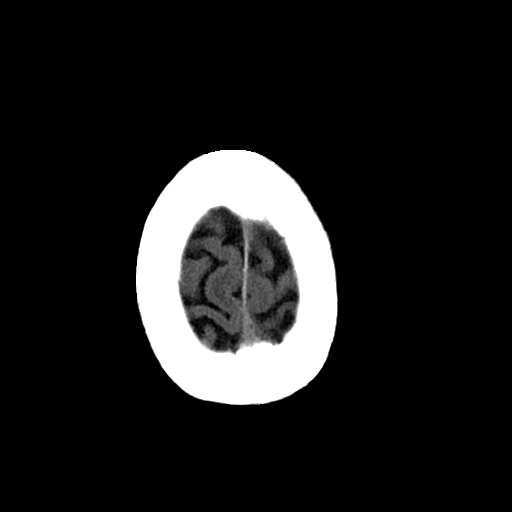
[im 28/33  bone]
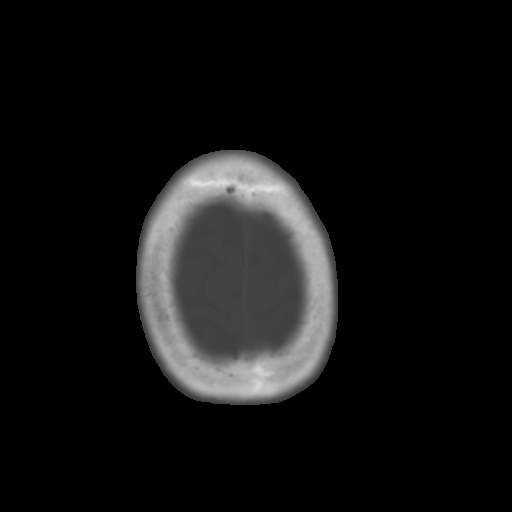
[im 30/33  brain]
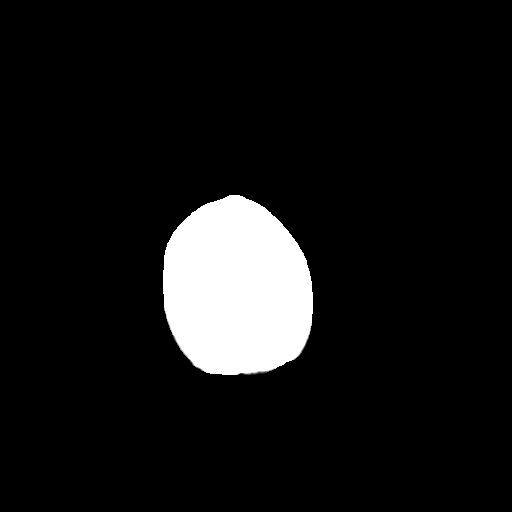

[Series 203: coronal st, idose (1) · coronal · 0.40mm/px · 3 of 77 slices shown]
[im 26/77  brain]
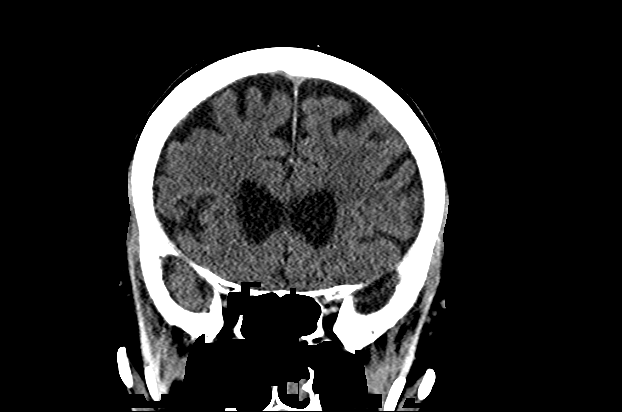
[im 34/77  brain]
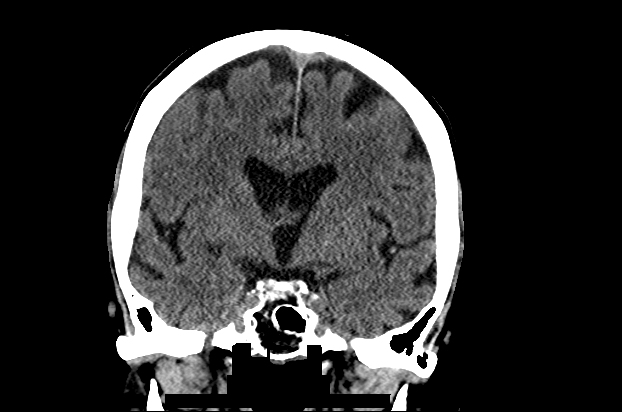
[im 43/77  brain]
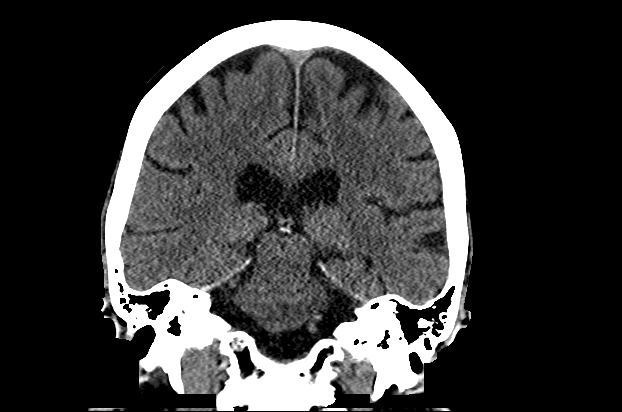

[Series 204: sagittal st, idose (1) · sagittal · 0.40mm/px · 3 of 82 slices shown]
[im 28/82  brain]
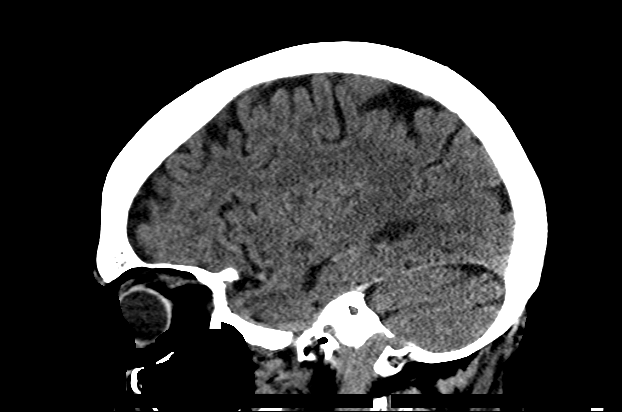
[im 41/82  brain]
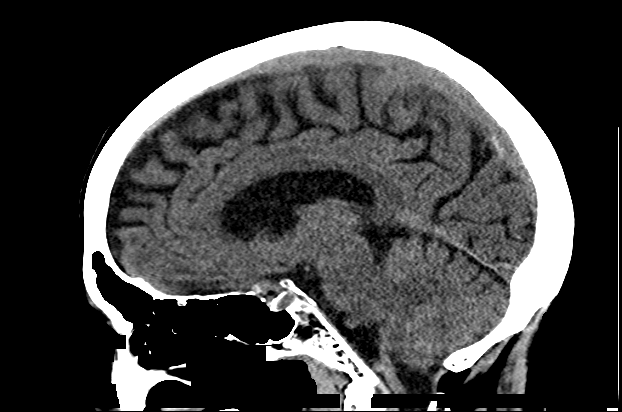
[im 55/82  brain]
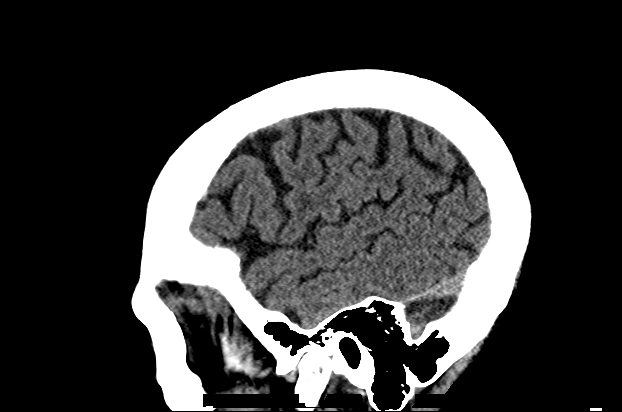

[16 of 47 positions shown; findings below may reference images not displayed]

FINDINGS: Brain: There is mild diffuse atrophy. There is no intracranial mass,
hemorrhage, extra-axial fluid collection, or midline shift. There is
mild small vessel disease in the centra semiovale bilaterally.
Elsewhere gray-white compartments appear normal. No acute infarct
evident.

Vascular: No hyperdense vessel. There are foci of calcification in
each carotid siphon region.

Skull: The bony calvarium appears intact.

Sinuses/Orbits: Visualized paranasal sinuses are clear. Orbits
appear symmetric bilaterally.

Other: Mastoid air cells are clear.
IMPRESSION: Mild atrophy with mild periventricular small vessel disease. No
intracranial mass, hemorrhage, or extra-axial fluid collection. No
acute infarct evident. There are scattered foci of arterial vascular
calcification.

## 2017-10-19 ENCOUNTER — Other Ambulatory Visit: Payer: Self-pay | Admitting: Internal Medicine

## 2017-10-19 NOTE — Telephone Encounter (Signed)
Pt last saw Dr Harrington Challenger 05/05/17, last labs 05/05/17 Creat 0.83, age 83, weight 95kg, based on specified criteria pt is on appropriate dosage of Eliquis 5mg  BID.  Will refill rx.

## 2017-10-20 IMAGING — CT CT ANGIO CHEST
2 of 8 series · 19 of 46 positions shown · IV contrast (APPLIED)
Comparison: None.

CLINICAL DATA: Syncopal episode.  Chest pain.

EXAM:
CT ANGIOGRAPHY CHEST WITH CONTRAST
TECHNIQUE: Multidetector CT imaging of the chest was performed using the
standard protocol during bolus administration of intravenous
contrast. Multiplanar CT image reconstructions and MIPs were
obtained to evaluate the vascular anatomy.
CONTRAST:  80 mL Isovue 370

[Series 5: thins · axial · 0.66mm/px · z∈[+1205,+1478]mm · 16 of 301 slices shown]
[im 14/301  lung]
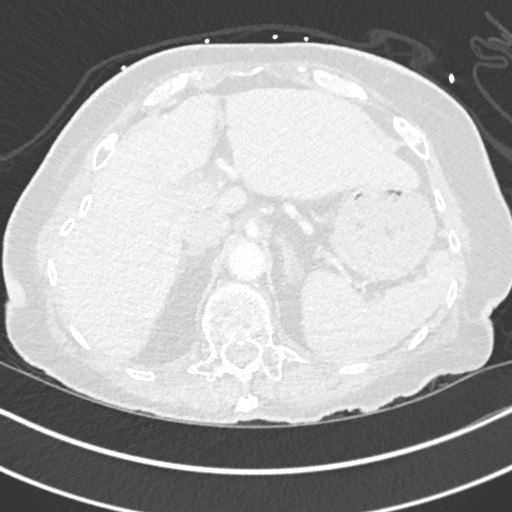
[im 28/301  soft-tissue]
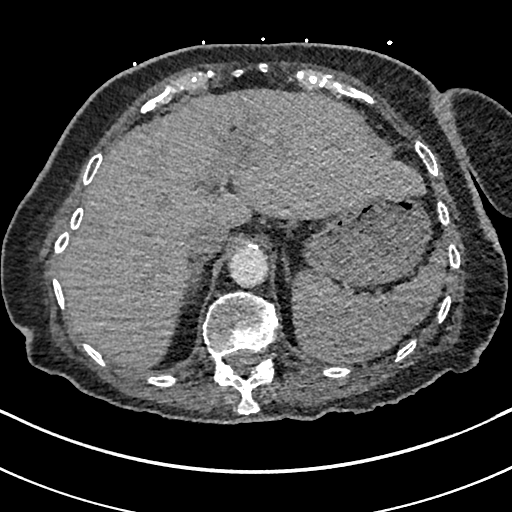
[im 55/301  lung]
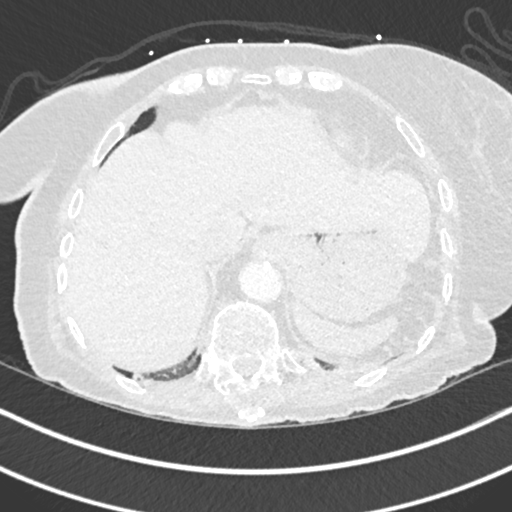
[im 69/301  soft-tissue]
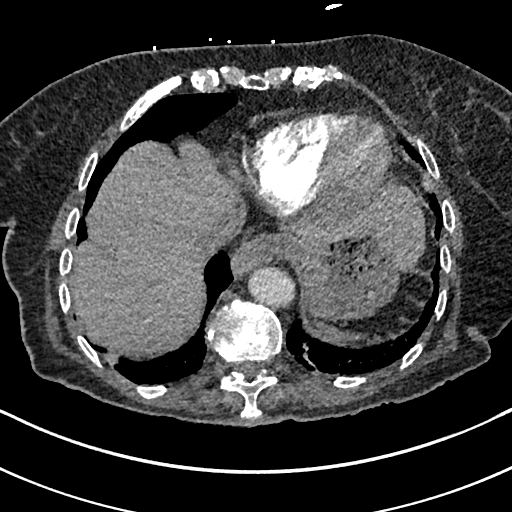
[im 82/301  lung]
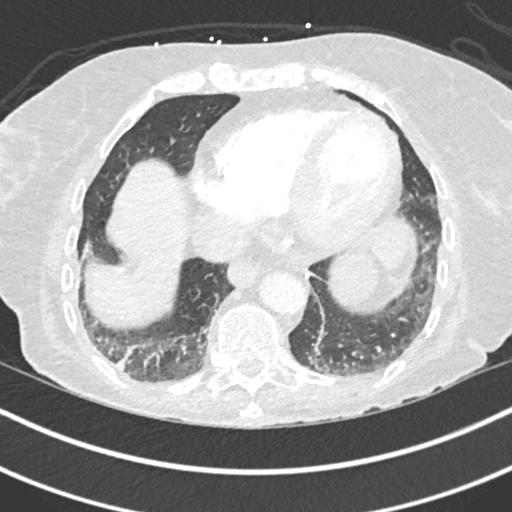
[im 110/301  soft-tissue]
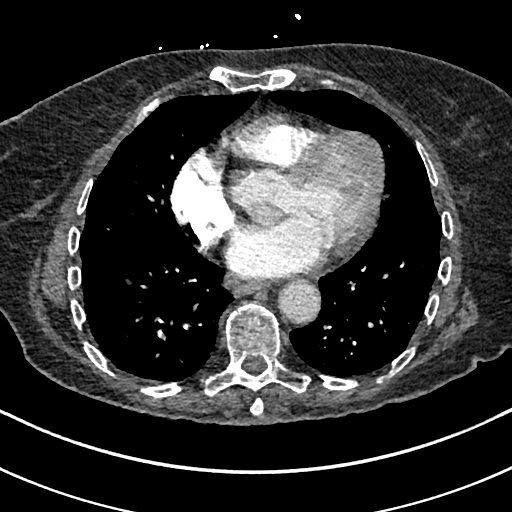
[im 123/301  lung]
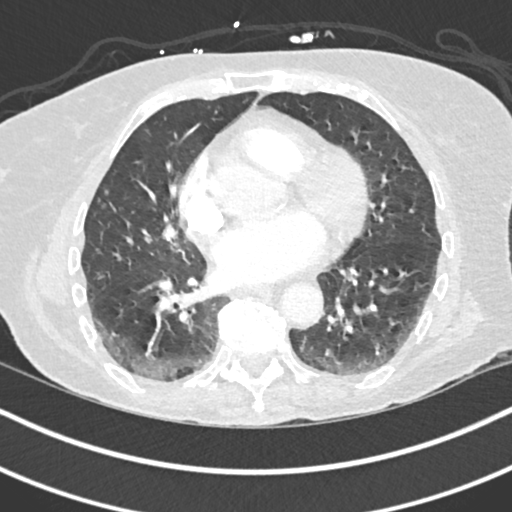
[im 137/301  soft-tissue]
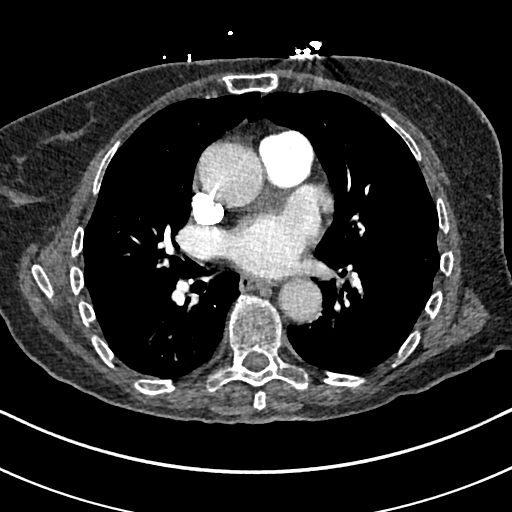
[im 164/301  lung]
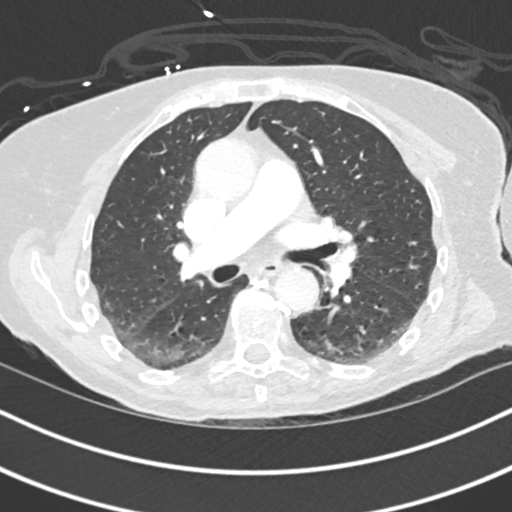
[im 178/301  soft-tissue]
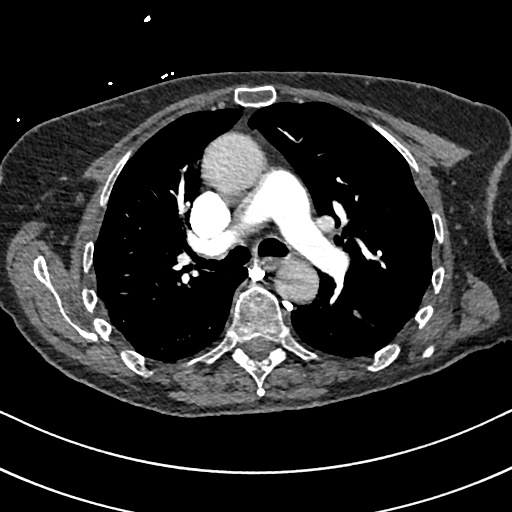
[im 191/301  lung]
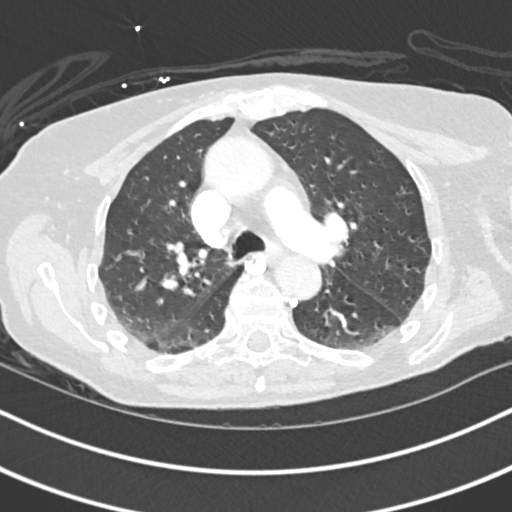
[im 219/301  soft-tissue]
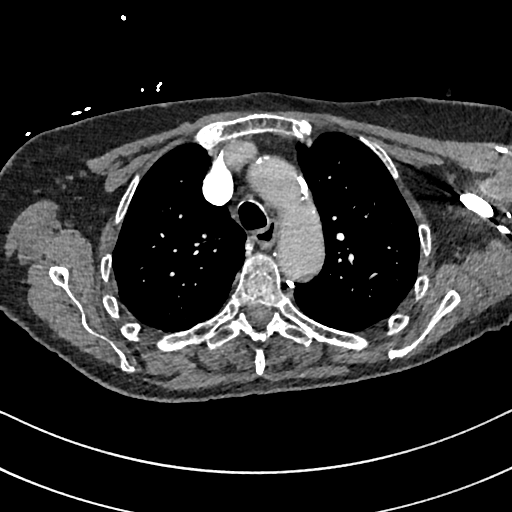
[im 232/301  lung]
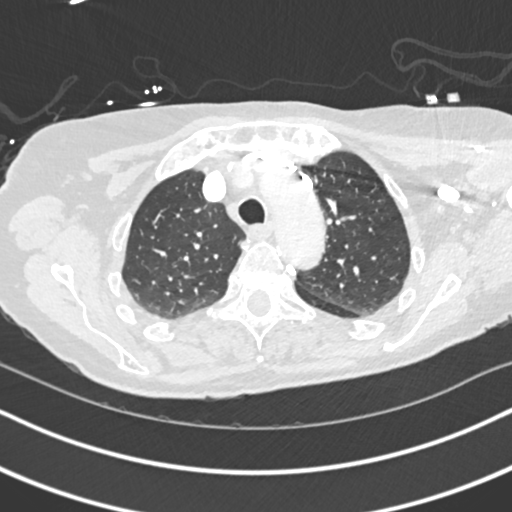
[im 246/301  soft-tissue]
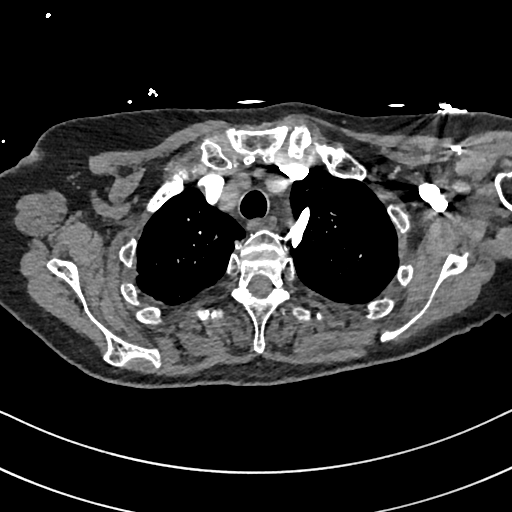
[im 273/301  lung]
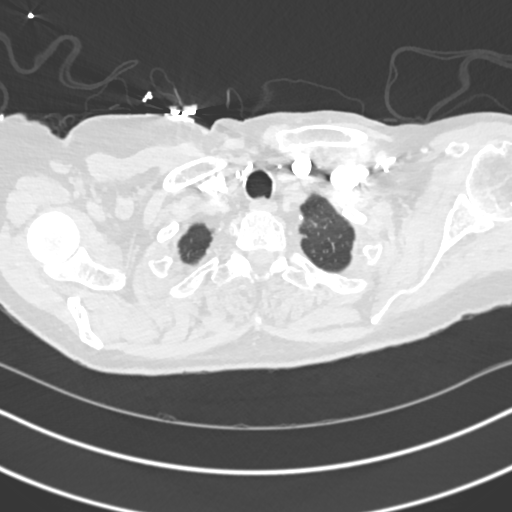
[im 287/301  soft-tissue]
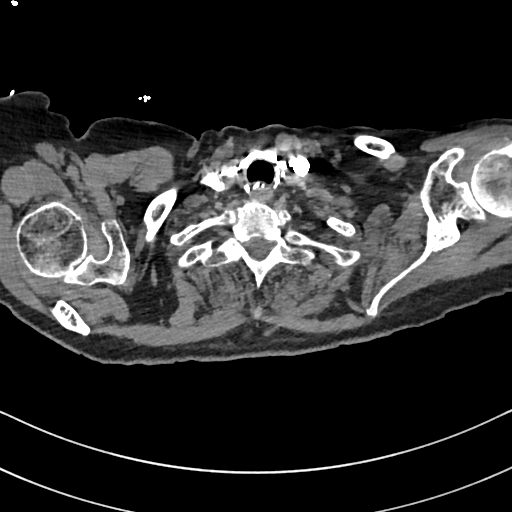

[Series 7: coronal mpr · coronal · 0.62mm/px · 3 of 149 slices shown]
[im 38/149  soft-tissue]
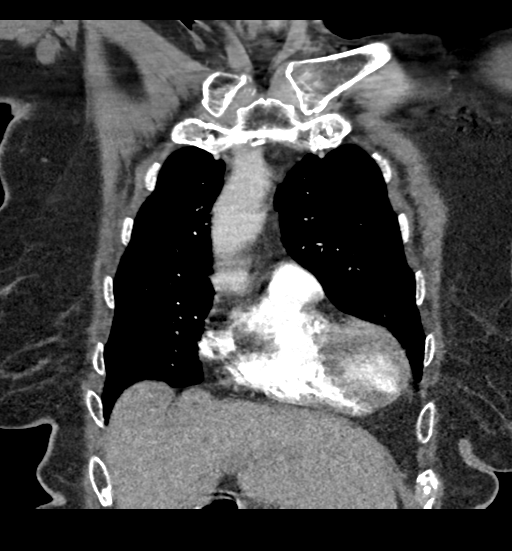
[im 75/149  soft-tissue]
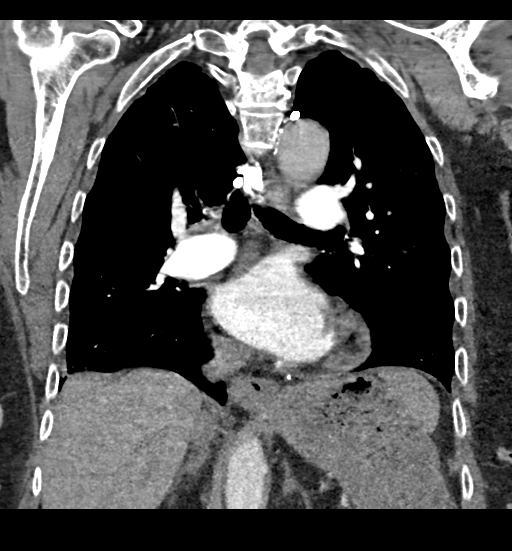
[im 112/149  soft-tissue]
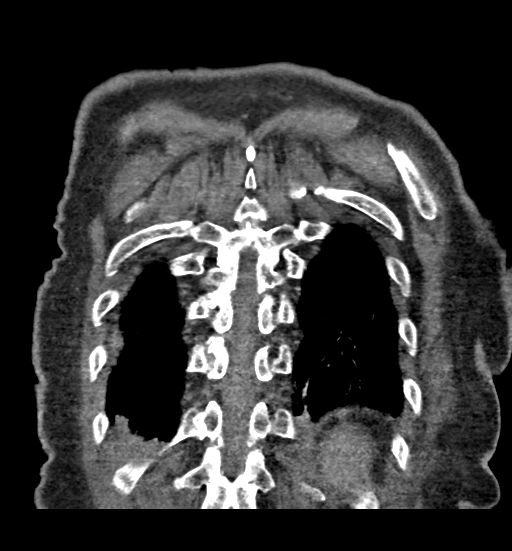

[19 of 46 positions shown; findings below may reference images not displayed]

FINDINGS: Cardiovascular: Satisfactory opacification of pulmonary arteries
noted, and no pulmonary emboli identified. An ascending thoracic
aortic aneurysm is seen measuring 4.1 cm. No evidence of thoracic
aortic dissection. Mild cardiomegaly.

Mediastinum/Nodes: No masses or pathologically enlarged lymph nodes
identified. Surgical clips from previous thyroidectomy.

Lungs/Pleura: No pulmonary mass, infiltrate, or effusion. Mild
bibasilar scarring.

Upper abdomen: No acute findings.

Musculoskeletal: No suspicious bone lesions or other significant
abnormality identified.

Review of the MIP images confirms the above findings.
IMPRESSION: No radiographic evidence of pulmonary embolism or other acute
findings.

4.1 cm ascending thoracic aortic aneurysm. Recommend annual imaging
followup by CTA or MRA. This recommendation follows 3383
ACCF/AHA/AATS/ACR/ASA/SCA/LAPIERRE/MORRISON/PADRIK/JURADO Guidelines for the
Diagnosis and Management of Patients with Thoracic Aortic Disease.
Circulation. 3383; 121: [id number]

## 2017-12-18 ENCOUNTER — Telehealth: Payer: Self-pay

## 2017-12-18 NOTE — Telephone Encounter (Signed)
Spoke with patient. Scheduled her with PR on 01/19/18.  She is overdue for visit.  She will discuss eliquis at this time, unless Dr. Harrington Challenger has recommendations sooner re: taking/stopping eliquis.  Was informed to continue at last OV for atrial fib.

## 2017-12-18 NOTE — Telephone Encounter (Signed)
**Note De-Identified Faith Parker Obfuscation** The pt states that Eliquis is too expensive and that she can no longer afford it.  I advised the pt that I would do a tier exception through her insurance but she declined stating that it is her understanding that she will not have to take Eliquis for much longer. She is aware that Coumadin is less expensive but does not want to take as she does not drive and that she lives in a facility that will not accommodate the diet she will need to maintain her INR.  She is requesting that I ask Dr Harrington Challenger if she can stop taking Eliquis. She states that she will come in for a OV if needed.  Will forward message to Dr Harrington Challenger and her nurse for advisement.

## 2017-12-20 DIAGNOSIS — R197 Diarrhea, unspecified: Secondary | ICD-10-CM | POA: Diagnosis not present

## 2017-12-20 DIAGNOSIS — K0510 Chronic gingivitis, plaque induced: Secondary | ICD-10-CM | POA: Diagnosis not present

## 2018-01-19 ENCOUNTER — Ambulatory Visit (INDEPENDENT_AMBULATORY_CARE_PROVIDER_SITE_OTHER): Payer: Medicare Other | Admitting: Internal Medicine

## 2018-01-19 ENCOUNTER — Encounter: Payer: Self-pay | Admitting: Internal Medicine

## 2018-01-19 VITALS — BP 140/84 | HR 59 | Ht 70.0 in | Wt 212.0 lb

## 2018-01-19 DIAGNOSIS — I480 Paroxysmal atrial fibrillation: Secondary | ICD-10-CM

## 2018-01-19 DIAGNOSIS — I359 Nonrheumatic aortic valve disorder, unspecified: Secondary | ICD-10-CM

## 2018-01-19 DIAGNOSIS — I10 Essential (primary) hypertension: Secondary | ICD-10-CM | POA: Diagnosis not present

## 2018-01-19 DIAGNOSIS — E039 Hypothyroidism, unspecified: Secondary | ICD-10-CM | POA: Diagnosis not present

## 2018-01-19 NOTE — Patient Instructions (Addendum)
Your physician recommends that you continue on your current medications as directed. Please refer to the Current Medication list given to you today.  Your physician recommends that you return for lab work today (cbc, bmet, tsh)  Your physician wants you to follow-up in: 6 months with Dr. Harrington Challenger.  You will receive a reminder letter in the mail two months in advance. If you don't receive a letter, please call our office to schedule the follow-up appointment.  Addendum: Your physician has requested that you have an echocardiogram. Echocardiography is a painless test that uses sound waves to create images of your heart. It provides your doctor with information about the size and shape of your heart and how well your heart's chambers and valves are working. This procedure takes approximately one hour. There are no restrictions for this procedure.

## 2018-01-19 NOTE — Progress Notes (Signed)
Cardiology Office Note   Date:  01/19/2018   ID:  Faith Parker, DOB 1930/02/29, MRN [id number]  PCP:  Faith Ruff, MD  Cardiologist:   Faith Carnes, MD   F/U of paroxysmal atrial fib      History of Present Illness: Faith Parker is a 84 y.o. female with a history of HTN, Aortic stenosis /insuff, hypothyroids.  I saw her back in feb  Episode of chest tightness and nearsyncope  EMS tele strips with atrial fib.  MRI nega  Echo with mild AS, AI.  CT neg for PE  Aorta 4.1 cm     Pt denies palpitations   No SOB  No CP  Has had dizziness Seen by Dr Drema Dallas  Has had some diarrhea  She says will have solid BM and occasional liquid  Dr Drema Dallas Told her to take probiotic    Pt says she tires easily  Has a lot of walking to do at friends home [place]    Having problems paying for meds  Last year paid $5000 out of pocket   Current Meds  Medication Sig  . ALPRAZolam (XANAX) 0.5 MG tablet Take 0.5 mg by mouth 3 (three) times daily as needed for anxiety.  . Ascorbic Acid (VITAMIN C PO) Take 1 tablet by mouth daily.  Marland Kitchen atenolol (TENORMIN) 25 MG tablet Take 25 mg by mouth 2 (two) times daily.   . cephALEXin (KEFLEX) 250 MG capsule Take 250 mg by mouth daily.   . chlorhexidine (PERIDEX) 0.12 % solution 2 (two) times daily.  . Cyanocobalamin (VITAMIN B-12 PO) Take 1 tablet by mouth daily.  Marland Kitchen ELIQUIS 5 MG TABS tablet TAKE 1 TABLET BY MOUTH TWICE DAILY.  Marland Kitchen FLUoxetine (PROZAC) 20 MG tablet Take 20 mg by mouth daily.  Marland Kitchen levothyroxine (SYNTHROID, LEVOTHROID) 112 MCG tablet Take 112 mcg by mouth daily before breakfast.  . losartan (COZAAR) 100 MG tablet Take 100 mg by mouth daily.   . Multiple Vitamins-Minerals (MULTIVITAMIN PO) Take 1 tablet by mouth daily.  . Omega-3 Fatty Acids (FISH OIL PO) Take 3 capsules by mouth daily.   . Probiotic Product (ALIGN PO) Take by mouth daily.  . traMADol (ULTRAM) 50 MG tablet Take 50 mg by mouth every 6 (six) hours as needed for moderate pain.      Allergies:    Ciprofloxacin; Latex; Levofloxacin; Macrobid [nitrofurantoin monohyd macro]; Nitrofurantoin; and Sulfa antibiotics   Past Medical History:  Diagnosis Date  . Anxiety   . Aortic regurgitation   . Gastritis   . GERD (gastroesophageal reflux disease)   . H/O artificial eye lens   . Hypertension   . Hypothyroid   . Left ventricular hypertrophy   . Major depression   . Osteoarthritis   . Osteoporosis   . Peptic ulcer disease   . Stress incontinence     Past Surgical History:  Procedure Laterality Date  . LEG SURGERY Right    multiple surgeries, unknown type     Social History:  The patient  reports that  has never smoked. she has never used smokeless tobacco. She reports that she does not drink alcohol or use drugs.   Family History:  The patient's family history is not on file.    ROS:  Please see the history of present illness. All other systems are reviewed and  Negative to the above problem except as noted.    PHYSICAL EXAM: VS:  BP 140/84 (BP Location: Left Arm, Patient Position: Sitting, Cuff  Size: Large)   Pulse (!) 59   Ht 5\' 10"  (1.778 m)   Wt 212 lb (96.2 kg)   SpO2 98%   BMI 30.42 kg/m   GEN: Well nourished, well developed, in no acute distress  HEENT: normal  Neck: no JVD, carotid bruits, or masses Cardiac: RRR; Gr III /vI sytolif murmur  No  rubs, or gallops,Tr edema  Respiratory:  clear to auscultation bilaterally, normal work of breathing GI: soft, nontender, nondistended, + BS  No hepatomegaly  MS: no deformity Moving all extremities   Skin: warm and dry, no rash Neuro:  Strength and sensation are intact Psych: euthymic mood, full affect   EKG:  EKG is not ordered today.   Lipid Panel No results found for: CHOL, TRIG, HDL, CHOLHDL, VLDL, LDLCALC, LDLDIRECT    Wt Readings from Last 3 Encounters:  01/19/18 212 lb (96.2 kg)  05/05/17 209 lb 6.4 oz (95 kg)  01/23/17 210 lb 6.4 oz (95.4 kg)      ASSESSMENT AND PLAN:  1  PAF  I would klike  for her to keep on Eliquis  Will see about coverage options   Check CBC and BMET   2  HTN  BP is OK  Keep on current meds    3 AV dz   Will get echo to reeval AV    4  GI  Has some loose BM  Some solid  I am not convinced true diarrhea  Taking probiotics  Can incease fiber    FU in 6 months    4  Dizzinesss  BP is OK    I encouraged her to remain active    WIll f/U in October 2018     Current medicines are reviewed at length with the patient today.  The patient does not have concerns regarding medicines.  Signed, Faith Carnes, MD  01/19/2018 11:39 AM    [facility] [place], [place], [place]  [address] Phone: [phone]; Fax: [phone]

## 2018-01-19 NOTE — Addendum Note (Signed)
Addended by: Rodman Key on: 01/19/2018 05:24 PM   Modules accepted: Orders

## 2018-01-20 LAB — CBC
HEMOGLOBIN: 13.2 g/dL (ref 11.1–15.9)
Hematocrit: 38.5 % (ref 34.0–46.6)
MCH: 31.6 pg (ref 26.6–33.0)
MCHC: 34.3 g/dL (ref 31.5–35.7)
MCV: 92 fL (ref 79–97)
Platelets: 222 10*3/uL (ref 150–379)
RBC: 4.18 x10E6/uL (ref 3.77–5.28)
RDW: 13.6 % (ref 12.3–15.4)
WBC: 5 10*3/uL (ref 3.4–10.8)

## 2018-01-20 LAB — BASIC METABOLIC PANEL
BUN/Creatinine Ratio: 15 (ref 12–28)
BUN: 13 mg/dL (ref 8–27)
CO2: 24 mmol/L (ref 20–29)
CREATININE: 0.87 mg/dL (ref 0.57–1.00)
Calcium: 9.9 mg/dL (ref 8.7–10.3)
Chloride: 96 mmol/L (ref 96–106)
GFR calc Af Amer: 70 mL/min/{1.73_m2} (ref >59)
GFR calc non Af Amer: 61 mL/min/{1.73_m2} (ref >59)
GLUCOSE: 93 mg/dL (ref 65–99)
Potassium: 4.3 mmol/L (ref 3.5–5.2)
Sodium: 134 mmol/L (ref 134–144)

## 2018-01-20 LAB — TSH: TSH: 0.762 u[IU]/mL (ref 0.450–4.500)

## 2018-01-22 ENCOUNTER — Telehealth: Payer: Self-pay

## 2018-01-22 NOTE — Telephone Encounter (Signed)
The pt is advised that we need her 2018 proof of income and 2019 out of pocket expense report from her pharmacy along with her [employer] pt assistance application and that we will take care of the providers part. She is aware that when all is completed I will fax to [employer] pt assistance program. The pt is also advised to contact the phone number listed on the front of the [employer] pt assistance application as she has questions about the program that I cannot answer. She verbalized understanding and thanked me for calling her.

## 2018-01-22 NOTE — Telephone Encounter (Signed)
-----   Message from Rodman Key, RN sent at 01/19/2018  5:25 PM EST ----- Regarding: patient assistance for eliquis Faith Parker, Patient seen today with Dr. Harrington Challenger. Was given patient assistance forms for Eliquis. I told her you'd call her to discuss eligibility.  I think she said her income is $2500/mo. Thank you. Michalene

## 2018-01-29 ENCOUNTER — Other Ambulatory Visit: Payer: Self-pay

## 2018-01-29 ENCOUNTER — Ambulatory Visit (HOSPITAL_COMMUNITY): Payer: Medicare Other | Attending: Cardiovascular Disease

## 2018-01-29 DIAGNOSIS — I359 Nonrheumatic aortic valve disorder, unspecified: Secondary | ICD-10-CM

## 2018-01-29 DIAGNOSIS — R079 Chest pain, unspecified: Secondary | ICD-10-CM | POA: Insufficient documentation

## 2018-01-29 DIAGNOSIS — I358 Other nonrheumatic aortic valve disorders: Secondary | ICD-10-CM | POA: Insufficient documentation

## 2018-01-29 DIAGNOSIS — R42 Dizziness and giddiness: Secondary | ICD-10-CM | POA: Insufficient documentation

## 2018-01-29 DIAGNOSIS — I350 Nonrheumatic aortic (valve) stenosis: Secondary | ICD-10-CM | POA: Diagnosis not present

## 2018-01-29 DIAGNOSIS — I340 Nonrheumatic mitral (valve) insufficiency: Secondary | ICD-10-CM | POA: Insufficient documentation

## 2018-01-29 DIAGNOSIS — R55 Syncope and collapse: Secondary | ICD-10-CM | POA: Diagnosis not present

## 2018-01-29 MED ORDER — APIXABAN 5 MG PO TABS: 5.0000 mg | ORAL_TABLET | Freq: Two times a day (BID) | ORAL | 3 refills | Status: DC

## 2018-01-29 NOTE — Telephone Encounter (Signed)
The pt returned her completed [employer] pt assistance application. I have completed the providers part, printed an Eliquis RX and placed both in Dr Harrington Challenger' mail bin awaiting her signature.

## 2018-02-01 ENCOUNTER — Telehealth: Payer: Self-pay | Admitting: *Deleted

## 2018-02-01 NOTE — Telephone Encounter (Signed)
Faxed patient assistance forms to [employer] for [place], received 15 page confirmation.

## 2018-02-02 ENCOUNTER — Telehealth: Payer: Self-pay | Admitting: Internal Medicine

## 2018-02-02 DIAGNOSIS — I712 Thoracic aortic aneurysm, without rupture, unspecified: Secondary | ICD-10-CM

## 2018-02-02 NOTE — Telephone Encounter (Signed)
New Message   Faith Parker is calling from Dr. Drema Dallas office with Faith [facility]. Dr. Drema Dallas would like to know on the patients recent echocardiogram did we look at her thoracic aortic aneurysm and can they get a copy of the report. Please call [phone] office.

## 2018-02-03 NOTE — Telephone Encounter (Signed)
Forward echo. Portion of aorta with mild aneurysm is not in window of echo  Would require CT or MRI Should be done in late spring summer (CT)

## 2018-02-05 NOTE — Telephone Encounter (Signed)
Forwarded echo report to Dr. Drema Dallas via [place] fax.   Will route to Dr. Harrington Challenger to clarify if Dr. Harrington Challenger will be ordering a CT or an MRI of chest.

## 2018-02-08 NOTE — Telephone Encounter (Signed)
Get CT of chest to evaluate Aorta  May need a BMET prior

## 2018-02-12 NOTE — Telephone Encounter (Signed)
Left message for patient to call back. Placed order for chest ct to evaluate aorta.  Last BMET was 01/23/18.. Creatinine normal at that time.

## 2018-02-13 NOTE — Telephone Encounter (Signed)
Spoke with the pt and informed her of communication between Dr Harrington Challenger and her PCP Dr Drema Dallas.   Informed the pt that both Dr Harrington Challenger and Dr Drema Dallas office were d about the pts recent echo, and needing to be scheduled for CT of the chest, to make sure her thoracic aortic aneurysm is still stable.  Informed the pt that Dr Alan Ripper RN placed the order for the CT yesterday, but wanted the pt to be made aware of this before scheduling. Informed the pt that a scheduler from our office will call her back to arrange for this test to be done.  Pt verbalized understanding and agrees with this plan.  Will clarify with Dr Alan Ripper RN on timing of when this test should be scheduled.   Spoke with Dr Alan Ripper RN and she verbalized to have [facility] schedule this CT for sometime in the near future.  Not urgent.

## 2018-02-13 NOTE — Telephone Encounter (Signed)
Follow up    Patient returning call , did not know what it was regarding

## 2018-02-22 ENCOUNTER — Ambulatory Visit (INDEPENDENT_AMBULATORY_CARE_PROVIDER_SITE_OTHER)
Admission: RE | Admit: 2018-02-22 | Discharge: 2018-02-22 | Disposition: A | Discharge status: Home / Self Care | Payer: Medicare Other | Source: Ambulatory Visit | Attending: Internal Medicine | Admitting: Internal Medicine

## 2018-02-22 ENCOUNTER — Encounter (INDEPENDENT_AMBULATORY_CARE_PROVIDER_SITE_OTHER): Payer: Self-pay

## 2018-02-22 DIAGNOSIS — I712 Thoracic aortic aneurysm, without rupture, unspecified: Secondary | ICD-10-CM

## 2018-02-22 MED ORDER — IOPAMIDOL (ISOVUE-370) INJECTION 76%
100.0000 mL | Freq: Once | INTRAVENOUS | Status: AC | PRN
  Administered 2018-02-22: 100 mL via INTRAVENOUS

## 2018-02-28 ENCOUNTER — Telehealth: Payer: Self-pay | Admitting: Internal Medicine

## 2018-02-28 NOTE — Telephone Encounter (Signed)
Pt is aware of the CT scan results. Pt verbalized understanding.

## 2018-02-28 NOTE — Telephone Encounter (Signed)
New Message ° ° °Patient is returning call in reference to CT results. Please call to discuss.  °

## 2018-04-02 DIAGNOSIS — E669 Obesity, unspecified: Secondary | ICD-10-CM | POA: Diagnosis not present

## 2018-04-02 DIAGNOSIS — M150 Primary generalized (osteo)arthritis: Secondary | ICD-10-CM | POA: Diagnosis not present

## 2018-04-02 DIAGNOSIS — M5136 Other intervertebral disc degeneration, lumbar region: Secondary | ICD-10-CM | POA: Diagnosis not present

## 2018-04-02 DIAGNOSIS — Z6832 Body mass index (BMI) 32.0-32.9, adult: Secondary | ICD-10-CM | POA: Diagnosis not present

## 2018-04-02 DIAGNOSIS — M25561 Pain in right knee: Secondary | ICD-10-CM | POA: Diagnosis not present

## 2018-04-02 DIAGNOSIS — E559 Vitamin D deficiency, unspecified: Secondary | ICD-10-CM | POA: Diagnosis not present

## 2018-04-02 DIAGNOSIS — M810 Age-related osteoporosis without current pathological fracture: Secondary | ICD-10-CM | POA: Diagnosis not present

## 2018-04-20 NOTE — Telephone Encounter (Signed)
**Note De-Identified Faith Parker Obfuscation** Pt assistance for Eliquis has been approved per letter [place] fax from [place]. Approval good until 12/04/18. Application Case# [id number]

## 2018-04-26 DIAGNOSIS — F419 Anxiety disorder, unspecified: Secondary | ICD-10-CM | POA: Diagnosis not present

## 2018-04-26 DIAGNOSIS — M810 Age-related osteoporosis without current pathological fracture: Secondary | ICD-10-CM | POA: Diagnosis not present

## 2018-04-26 DIAGNOSIS — R413 Other amnesia: Secondary | ICD-10-CM | POA: Diagnosis not present

## 2018-04-26 DIAGNOSIS — E039 Hypothyroidism, unspecified: Secondary | ICD-10-CM | POA: Diagnosis not present

## 2018-04-26 DIAGNOSIS — I712 Thoracic aortic aneurysm, without rupture: Secondary | ICD-10-CM | POA: Diagnosis not present

## 2018-04-26 DIAGNOSIS — F339 Major depressive disorder, recurrent, unspecified: Secondary | ICD-10-CM | POA: Diagnosis not present

## 2018-04-26 DIAGNOSIS — Z1231 Encounter for screening mammogram for malignant neoplasm of breast: Secondary | ICD-10-CM | POA: Diagnosis not present

## 2018-04-26 DIAGNOSIS — I10 Essential (primary) hypertension: Secondary | ICD-10-CM | POA: Diagnosis not present

## 2018-04-26 DIAGNOSIS — I480 Paroxysmal atrial fibrillation: Secondary | ICD-10-CM | POA: Diagnosis not present

## 2018-04-26 DIAGNOSIS — Z0000 Encounter for general adult medical examination without abnormal findings: Secondary | ICD-10-CM | POA: Diagnosis not present

## 2018-04-26 DIAGNOSIS — Z13220 Encounter for screening for lipoid disorders: Secondary | ICD-10-CM | POA: Diagnosis not present

## 2018-04-26 DIAGNOSIS — E559 Vitamin D deficiency, unspecified: Secondary | ICD-10-CM | POA: Diagnosis not present

## 2018-04-26 DIAGNOSIS — E878 Other disorders of electrolyte and fluid balance, not elsewhere classified: Secondary | ICD-10-CM | POA: Diagnosis not present

## 2018-05-02 ENCOUNTER — Encounter: Payer: Self-pay | Admitting: Neurology

## 2018-06-03 ENCOUNTER — Encounter (HOSPITAL_COMMUNITY): Payer: Self-pay

## 2018-06-03 ENCOUNTER — Inpatient Hospital Stay (HOSPITAL_COMMUNITY): Payer: Medicare Other

## 2018-06-03 ENCOUNTER — Other Ambulatory Visit: Payer: Self-pay

## 2018-06-03 ENCOUNTER — Inpatient Hospital Stay (HOSPITAL_COMMUNITY)
Admission: EM | Admit: 2018-06-03 | Discharge: 2018-06-05 | DRG: 243 | Disposition: A | Discharge status: Home / Self Care | Payer: Medicare Other | Attending: Cardiology | Admitting: Cardiology

## 2018-06-03 DIAGNOSIS — I480 Paroxysmal atrial fibrillation: Secondary | ICD-10-CM | POA: Diagnosis not present

## 2018-06-03 DIAGNOSIS — Z91040 Latex allergy status: Secondary | ICD-10-CM | POA: Diagnosis not present

## 2018-06-03 DIAGNOSIS — Z79891 Long term (current) use of opiate analgesic: Secondary | ICD-10-CM | POA: Diagnosis not present

## 2018-06-03 DIAGNOSIS — Z882 Allergy status to sulfonamides status: Secondary | ICD-10-CM | POA: Diagnosis not present

## 2018-06-03 DIAGNOSIS — Z79890 Hormone replacement therapy (postmenopausal): Secondary | ICD-10-CM

## 2018-06-03 DIAGNOSIS — I499 Cardiac arrhythmia, unspecified: Secondary | ICD-10-CM | POA: Diagnosis not present

## 2018-06-03 DIAGNOSIS — I712 Thoracic aortic aneurysm, without rupture: Secondary | ICD-10-CM | POA: Diagnosis present

## 2018-06-03 DIAGNOSIS — Z7901 Long term (current) use of anticoagulants: Secondary | ICD-10-CM | POA: Diagnosis not present

## 2018-06-03 DIAGNOSIS — I361 Nonrheumatic tricuspid (valve) insufficiency: Secondary | ICD-10-CM | POA: Diagnosis not present

## 2018-06-03 DIAGNOSIS — Z888 Allergy status to other drugs, medicaments and biological substances status: Secondary | ICD-10-CM

## 2018-06-03 DIAGNOSIS — E039 Hypothyroidism, unspecified: Secondary | ICD-10-CM | POA: Diagnosis present

## 2018-06-03 DIAGNOSIS — F419 Anxiety disorder, unspecified: Secondary | ICD-10-CM | POA: Diagnosis present

## 2018-06-03 DIAGNOSIS — Z87440 Personal history of urinary (tract) infections: Secondary | ICD-10-CM | POA: Diagnosis not present

## 2018-06-03 DIAGNOSIS — K219 Gastro-esophageal reflux disease without esophagitis: Secondary | ICD-10-CM | POA: Diagnosis present

## 2018-06-03 DIAGNOSIS — I10 Essential (primary) hypertension: Secondary | ICD-10-CM | POA: Diagnosis present

## 2018-06-03 DIAGNOSIS — I352 Nonrheumatic aortic (valve) stenosis with insufficiency: Secondary | ICD-10-CM | POA: Diagnosis not present

## 2018-06-03 DIAGNOSIS — M1990 Unspecified osteoarthritis, unspecified site: Secondary | ICD-10-CM | POA: Diagnosis present

## 2018-06-03 DIAGNOSIS — I4891 Unspecified atrial fibrillation: Secondary | ICD-10-CM

## 2018-06-03 DIAGNOSIS — I495 Sick sinus syndrome: Secondary | ICD-10-CM | POA: Diagnosis not present

## 2018-06-03 DIAGNOSIS — I4892 Unspecified atrial flutter: Secondary | ICD-10-CM | POA: Diagnosis not present

## 2018-06-03 DIAGNOSIS — Z950 Presence of cardiac pacemaker: Secondary | ICD-10-CM | POA: Diagnosis not present

## 2018-06-03 DIAGNOSIS — Z79899 Other long term (current) drug therapy: Secondary | ICD-10-CM | POA: Diagnosis not present

## 2018-06-03 DIAGNOSIS — R0789 Other chest pain: Secondary | ICD-10-CM | POA: Diagnosis not present

## 2018-06-03 DIAGNOSIS — M810 Age-related osteoporosis without current pathological fracture: Secondary | ICD-10-CM | POA: Diagnosis present

## 2018-06-03 DIAGNOSIS — F329 Major depressive disorder, single episode, unspecified: Secondary | ICD-10-CM | POA: Diagnosis not present

## 2018-06-03 DIAGNOSIS — Z881 Allergy status to other antibiotic agents status: Secondary | ICD-10-CM

## 2018-06-03 DIAGNOSIS — N390 Urinary tract infection, site not specified: Secondary | ICD-10-CM | POA: Diagnosis not present

## 2018-06-03 DIAGNOSIS — N393 Stress incontinence (female) (male): Secondary | ICD-10-CM | POA: Diagnosis present

## 2018-06-03 DIAGNOSIS — R001 Bradycardia, unspecified: Secondary | ICD-10-CM | POA: Diagnosis not present

## 2018-06-03 DIAGNOSIS — Z8711 Personal history of peptic ulcer disease: Secondary | ICD-10-CM | POA: Diagnosis not present

## 2018-06-03 DIAGNOSIS — R079 Chest pain, unspecified: Secondary | ICD-10-CM

## 2018-06-03 DIAGNOSIS — Z959 Presence of cardiac and vascular implant and graft, unspecified: Secondary | ICD-10-CM

## 2018-06-03 DIAGNOSIS — J449 Chronic obstructive pulmonary disease, unspecified: Secondary | ICD-10-CM | POA: Diagnosis present

## 2018-06-03 DIAGNOSIS — R0602 Shortness of breath: Secondary | ICD-10-CM | POA: Diagnosis not present

## 2018-06-03 DIAGNOSIS — I351 Nonrheumatic aortic (valve) insufficiency: Secondary | ICD-10-CM | POA: Diagnosis not present

## 2018-06-03 LAB — COMPREHENSIVE METABOLIC PANEL
ALT: 14 U/L (ref 0–44)
AST: 20 U/L (ref 15–41)
Albumin: 3.5 g/dL (ref 3.5–5.0)
Alkaline Phosphatase: 47 U/L (ref 38–126)
Anion gap: 9 (ref 5–15)
BILIRUBIN TOTAL: 0.6 mg/dL (ref 0.3–1.2)
BUN: 15 mg/dL (ref 8–23)
CO2: 26 mmol/L (ref 22–32)
CREATININE: 0.97 mg/dL (ref 0.44–1.00)
Calcium: 8.9 mg/dL (ref 8.9–10.3)
Chloride: 101 mmol/L (ref 98–111)
GFR, EST AFRICAN AMERICAN: 60 mL/min — AB (ref >60)
GFR, EST NON AFRICAN AMERICAN: 51 mL/min — AB (ref >60)
Glucose, Bld: 138 mg/dL — ABNORMAL HIGH (ref 70–99)
POTASSIUM: 3.4 mmol/L — AB (ref 3.5–5.1)
Sodium: 136 mmol/L (ref 135–145)
TOTAL PROTEIN: 5.8 g/dL — AB (ref 6.5–8.1)

## 2018-06-03 LAB — CBC WITH DIFFERENTIAL/PLATELET
Abs Immature Granulocytes: 0 10*3/uL (ref 0.0–0.1)
BASOS ABS: 0 10*3/uL (ref 0.0–0.1)
Basophils Relative: 1 %
EOS PCT: 1 %
Eosinophils Absolute: 0.1 10*3/uL (ref 0.0–0.7)
HCT: 37.9 % (ref 36.0–46.0)
Hemoglobin: 12.2 g/dL (ref 12.0–15.0)
Immature Granulocytes: 0 %
Lymphocytes Relative: 21 %
Lymphs Abs: 1.2 10*3/uL (ref 0.7–4.0)
MCH: 30.7 pg (ref 26.0–34.0)
MCHC: 32.2 g/dL (ref 30.0–36.0)
MCV: 95.2 fL (ref 78.0–100.0)
MONO ABS: 0.5 10*3/uL (ref 0.1–1.0)
Monocytes Relative: 8 %
Neutro Abs: 3.9 10*3/uL (ref 1.7–7.7)
Neutrophils Relative %: 69 %
PLATELETS: 191 10*3/uL (ref 150–400)
RBC: 3.98 MIL/uL (ref 3.87–5.11)
RDW: 13.2 % (ref 11.5–15.5)
WBC: 5.7 10*3/uL (ref 4.0–10.5)

## 2018-06-03 LAB — I-STAT CHEM 8, ED
BUN: 19 mg/dL (ref 8–23)
CALCIUM ION: 1.08 mmol/L — AB (ref 1.15–1.40)
CHLORIDE: 99 mmol/L (ref 98–111)
CREATININE: 0.9 mg/dL (ref 0.44–1.00)
Glucose, Bld: 133 mg/dL — ABNORMAL HIGH (ref 70–99)
HCT: 37 % (ref 36.0–46.0)
Hemoglobin: 12.6 g/dL (ref 12.0–15.0)
Potassium: 3.3 mmol/L — ABNORMAL LOW (ref 3.5–5.1)
Sodium: 136 mmol/L (ref 135–145)
TCO2: 24 mmol/L (ref 22–32)

## 2018-06-03 LAB — HEPARIN LEVEL (UNFRACTIONATED): Heparin Unfractionated: 1.56 IU/mL — ABNORMAL HIGH (ref 0.30–0.70)

## 2018-06-03 LAB — PROTIME-INR
INR: 1.16
PROTHROMBIN TIME: 14.7 s (ref 11.4–15.2)

## 2018-06-03 LAB — I-STAT TROPONIN, ED: TROPONIN I, POC: 0 ng/mL (ref 0.00–0.08)

## 2018-06-03 LAB — TROPONIN I: Troponin I: <0.03 ng/mL (ref <0.03)

## 2018-06-03 LAB — MRSA PCR SCREENING: MRSA by PCR: NEGATIVE

## 2018-06-03 LAB — APTT: APTT: 32 s (ref 24–36)

## 2018-06-03 LAB — LIPASE, BLOOD: LIPASE: 52 U/L — AB (ref 11–51)

## 2018-06-03 LAB — TSH: TSH: 0.473 u[IU]/mL (ref 0.350–4.500)

## 2018-06-03 MED ORDER — FLUOXETINE HCL 20 MG PO CAPS
20.0000 mg | ORAL_CAPSULE | Freq: Every day | ORAL | Status: DC
  Administered 2018-06-04 – 2018-06-05 (×2): 20 mg via ORAL
  Filled 2018-06-03 (×4): qty 1

## 2018-06-03 MED ORDER — TRAMADOL HCL 50 MG PO TABS
50.0000 mg | ORAL_TABLET | Freq: Four times a day (QID) | ORAL | Status: DC | PRN
  Administered 2018-06-04: 18:00:00 50 mg via ORAL
  Filled 2018-06-03: qty 1

## 2018-06-03 MED ORDER — HEPARIN (PORCINE) IN NACL 100-0.45 UNIT/ML-% IJ SOLN
1250.0000 [IU]/h | INTRAMUSCULAR | Status: DC
  Filled 2018-06-03 (×2): qty 250

## 2018-06-03 MED ORDER — SODIUM CHLORIDE 0.9 % IV SOLN
Freq: Once | INTRAVENOUS | Status: AC
  Administered 2018-06-03: 18:00:00 via INTRAVENOUS

## 2018-06-03 MED ORDER — CEPHALEXIN 250 MG PO CAPS
250.0000 mg | ORAL_CAPSULE | Freq: Every day | ORAL | Status: DC
  Administered 2018-06-04 – 2018-06-05 (×2): 250 mg via ORAL
  Filled 2018-06-03 (×2): qty 1

## 2018-06-03 MED ORDER — ALPRAZOLAM 0.5 MG PO TABS
0.5000 mg | ORAL_TABLET | Freq: Three times a day (TID) | ORAL | Status: DC | PRN
  Administered 2018-06-03: 0.5 mg via ORAL
  Filled 2018-06-03 (×2): qty 1

## 2018-06-03 MED ORDER — POTASSIUM CHLORIDE ER 10 MEQ PO TBCR
20.0000 meq | EXTENDED_RELEASE_TABLET | Freq: Once | ORAL | Status: AC
  Administered 2018-06-03: 20 meq via ORAL
  Filled 2018-06-03 (×2): qty 2

## 2018-06-03 MED ORDER — LOSARTAN POTASSIUM 50 MG PO TABS
100.0000 mg | ORAL_TABLET | Freq: Every day | ORAL | Status: DC
  Administered 2018-06-04 – 2018-06-05 (×2): 100 mg via ORAL
  Filled 2018-06-03 (×3): qty 2

## 2018-06-03 MED ORDER — LEVOTHYROXINE SODIUM 112 MCG PO TABS
112.0000 ug | ORAL_TABLET | Freq: Every day | ORAL | Status: DC
  Administered 2018-06-04 – 2018-06-05 (×2): 112 ug via ORAL
  Filled 2018-06-03 (×2): qty 1

## 2018-06-03 MED ORDER — ASPIRIN 325 MG PO TABS
325.0000 mg | ORAL_TABLET | Freq: Once | ORAL | Status: AC
  Administered 2018-06-03: 325 mg via ORAL
  Filled 2018-06-03: qty 1

## 2018-06-03 NOTE — Consult Note (Signed)
Full note in am Mother of close family friend   Imp  1) Chest pain recurrent  2) Atrial fib RVR with post termnation pauses > 7 sec 3) Extreme recent stress with death of Granddaughter 4) HTN 5) AI  Mild     CP scenario may be ischemic, takotsubo or related to tachybrady She is having profound post termination pauses, and will almost certainly require pacing Await followup enzymes and echo to determine whether cath is necessary  The benefits and risks were reviewed including but not limited to death,  perforation, infection, lead dislodgement and device malfunction.  The patient understands agrees and is willing to proceed.  hhave written orders for pacing just in case

## 2018-06-03 NOTE — ED Notes (Signed)
Spoke with cardiology about ASA and ok to take.  Reasoning explained to pt.

## 2018-06-03 NOTE — ED Notes (Signed)
Pt concerned about taking Aspirin due to being on Eliquis.  Called cardiologist to confirm.

## 2018-06-03 NOTE — H&P (Signed)
History and Physical  Primary Cardiologist: Harrington Challenger PCP: Leighton Ruff, MD  Chief Complaint: chest pain  HPI:  This is an 84 year old female with history of paroxysmal atrial fibrillation on Eliquis and atenolol, hypertension, hyperthyroidism, aortic stenosis-mild by echo February 2019-she presents to the emergency room with chest discomfort throughout the afternoon and pauses on telemetry.  This is been a challenging month for her and her family, her granddaughter who is 56 passed away and she had her funeral in mid June.  She has been very troubled by this saying she has a "broken heart".  She has had less energy the last week without specific chest pain.  This afternoon she started developing more significant chest discomfort which prompted her to call the emergency services.  Had some mild symptoms this morning as well.  Mild shortness of breath.  She is unaware of her atrial fibrillation or arrhythmias causing her any symptoms.  In route, she had several episodes of 5 to 7-second pauses and a couple episodes of rapid ventricular response from atrial fibrillation up to 120s.  She did feel lightheaded during these episodes.  She also notes several periods of time throughout the day today where she was significantly lightheaded also.    EKG on arrival shows atrial fib/flutter with some nonspecific ST segment changes.  Her second EKG on file was obtained during an episode of a long pause that she had while in the emergency room.  She was lightheaded during this spell.  Subsequently pacing pads were placed, but not used.  On my interview, after this event, she has minimal residual symptoms.  She states that she is still grieving her granddaughter.  Right now, minimal chest pain if at all.  No lightheadedness shortness of breath or any concerning signs or symptoms.  She denies any bleeding difficulties with her Eliquis.  We again repeated her EKG a third time which again shows no acute ischemic  changes.  Prior Cardiac Studies:  Past Medical History:  Diagnosis Date  . Anxiety   . Aortic regurgitation   . Gastritis   . GERD (gastroesophageal reflux disease)   . H/O artificial eye lens   . Hypertension   . Hypothyroid   . Left ventricular hypertrophy   . Major depression   . Osteoarthritis   . Osteoporosis   . Peptic ulcer disease   . Stress incontinence     Past Surgical History:  Procedure Laterality Date  . LEG SURGERY Right    multiple surgeries, unknown type    Family History  Problem Relation Age of Onset  . Coronary artery disease Neg Hx    Social History:  reports that she has never smoked. She has never used smokeless tobacco. She reports that she does not drink alcohol or use drugs.  Allergies:  Allergies  Allergen Reactions  . Ciprofloxacin Other (See Comments)    Unknown  . Latex Other (See Comments)    Unknown  . Levofloxacin Other (See Comments)    Unknown  . Macrobid [employer] Macro] Other (See Comments)    Unknown  . Nitrofurantoin Other (See Comments)    Unknown  . Sulfa Antibiotics Other (See Comments)    Unknown    No current facility-administered medications on file prior to encounter.    Current Outpatient Medications on File Prior to Encounter  Medication Sig Dispense Refill  . ALPRAZolam (XANAX) 0.5 MG tablet Take 0.5 mg by mouth 3 (three) times daily as needed for anxiety.    Marland Kitchen  apixaban (ELIQUIS) 5 MG TABS tablet Take 1 tablet (5 mg total) by mouth 2 (two) times daily. 180 tablet 3  . Ascorbic Acid (VITAMIN C PO) Take 1 tablet by mouth daily.    Marland Kitchen atenolol (TENORMIN) 25 MG tablet Take 25 mg by mouth 2 (two) times daily.     . cephALEXin (KEFLEX) 250 MG capsule Take 250 mg by mouth daily.     . chlorhexidine (PERIDEX) 0.12 % solution 2 (two) times daily.    . Cyanocobalamin (VITAMIN B-12 PO) Take 1 tablet by mouth daily.    Marland Kitchen FLUoxetine (PROZAC) 20 MG tablet Take 20 mg by mouth daily.    Marland Kitchen levothyroxine  (SYNTHROID, LEVOTHROID) 112 MCG tablet Take 112 mcg by mouth daily before breakfast.    . losartan (COZAAR) 100 MG tablet Take 100 mg by mouth daily.     . Multiple Vitamins-Minerals (MULTIVITAMIN PO) Take 1 tablet by mouth daily.    . Omega-3 Fatty Acids (FISH OIL PO) Take 3 capsules by mouth daily.     . Probiotic Product (ALIGN PO) Take by mouth daily.    . traMADol (ULTRAM) 50 MG tablet Take 50 mg by mouth every 6 (six) hours as needed for moderate pain.       _0 @ _1 @  Results for orders placed or performed during the hospital encounter of 06/03/18 (from the past 48 hour(s))  I-stat troponin, ED     Status: None   Collection Time: 06/03/18  6:17 PM  Result Value Ref Range   Troponin i, poc 0.00 0.00 - 0.08 ng/mL   Comment 3            Comment: Due to the release kinetics of cTnI, a negative result within the first hours of the onset of symptoms does not rule out myocardial infarction with certainty. If myocardial infarction is still suspected, repeat the test at appropriate intervals.   I-stat Chem 8, ED     Status: Abnormal   Collection Time: 06/03/18  6:18 PM  Result Value Ref Range   Sodium 136 135 - 145 mmol/L   Potassium 3.3 (L) 3.5 - 5.1 mmol/L   Chloride 99 98 - 111 mmol/L   BUN 19 8 - 23 mg/dL   Creatinine, Ser 0.90 0.44 - 1.00 mg/dL   Glucose, Bld 133 (H) 70 - 99 mg/dL   Calcium, Ion 1.08 (L) 1.15 - 1.40 mmol/L   TCO2 24 22 - 32 mmol/L   Hemoglobin 12.6 12.0 - 15.0 g/dL   HCT 37.0 36.0 - 46.0 %  Comprehensive metabolic panel     Status: Abnormal   Collection Time: 06/03/18  6:18 PM  Result Value Ref Range   Sodium 136 135 - 145 mmol/L   Potassium 3.4 (L) 3.5 - 5.1 mmol/L   Chloride 101 98 - 111 mmol/L    Comment: Please note change in reference range.   CO2 26 22 - 32 mmol/L   Glucose, Bld 138 (H) 70 - 99 mg/dL    Comment: Please note change in reference range.   BUN 15 8 - 23 mg/dL    Comment: Please note change in reference range.     Creatinine, Ser 0.97 0.44 - 1.00 mg/dL   Calcium 8.9 8.9 - 10.3 mg/dL   Total Protein 5.8 (L) 6.5 - 8.1 g/dL   Albumin 3.5 3.5 - 5.0 g/dL   AST 20 15 - 41 U/L   ALT 14 0 - 44 U/L    Comment: Please note  change in reference range.   Alkaline Phosphatase 47 38 - 126 U/L   Total Bilirubin 0.6 0.3 - 1.2 mg/dL   GFR calc non Af Amer 51 (L) >60 mL/min   GFR calc Af Amer 60 (L) >60 mL/min    Comment: (NOTE) The eGFR has been calculated using the CKD EPI equation. This calculation has not been validated in all clinical situations. eGFR's persistently <60 mL/min signify possible Chronic Kidney Disease.    Anion gap 9 5 - 15    Comment: Performed at [facility] [address], [place], [place] [address]  Lipase, blood     Status: Abnormal   Collection Time: 06/03/18  6:18 PM  Result Value Ref Range   Lipase 52 (H) 11 - 51 U/L    Comment: Performed at [facility] [address], [place], [place] [address]  CBC with Differential     Status: None   Collection Time: 06/03/18  6:18 PM  Result Value Ref Range   WBC 5.7 4.0 - 10.5 K/uL   RBC 3.98 3.87 - 5.11 MIL/uL   Hemoglobin 12.2 12.0 - 15.0 g/dL   HCT 37.9 36.0 - 46.0 %   MCV 95.2 78.0 - 100.0 fL   MCH 30.7 26.0 - 34.0 pg   MCHC 32.2 30.0 - 36.0 g/dL   RDW 13.2 11.5 - 15.5 %   Platelets 191 150 - 400 K/uL   Neutrophils Relative % 69 %   Neutro Abs 3.9 1.7 - 7.7 K/uL   Lymphocytes Relative 21 %   Lymphs Abs 1.2 0.7 - 4.0 K/uL   Monocytes Relative 8 %   Monocytes Absolute 0.5 0.1 - 1.0 K/uL   Eosinophils Relative 1 %   Eosinophils Absolute 0.1 0.0 - 0.7 K/uL   Basophils Relative 1 %   Basophils Absolute 0.0 0.0 - 0.1 K/uL   Immature Granulocytes 0 %   Abs Immature Granulocytes 0.0 0.0 - 0.1 K/uL    Comment: Performed at [facility], [address] [place], [place] [address]  Protime-INR     Status: None   Collection Time: 06/03/18  6:18 PM  Result Value Ref Range   Prothrombin Time 14.7 11.4 - 15.2  seconds   INR 1.16     Comment: Performed at [facility] [address], [place], [place] [address]   Dg Chest Port 1 View  Result Date: 06/03/2018 CLINICAL DATA:  Chest heaviness beginning this afternoon which shortness-of-breath. EXAM: PORTABLE CHEST 1 VIEW COMPARISON:  None. FINDINGS: Lungs are adequately inflated without focal airspace consolidation or effusion. Cardiomediastinal silhouette and remainder of the exam is unchanged. IMPRESSION: No active disease. Electronically Signed   By: Marin Olp M.D.   On: 06/03/2018 18:48    ECG/Tele: C3 EKGs.  First 1 A. fib flutter with nonspecific ST segment changes.  Second EKG captures a significant pauses greater than 5 seconds without any underlying atrial activity.  3rd EKG A. fib/flutter with out any ischemic changes.  ROS: As above. Otherwise, review of systems is negative unless per above HPI  Vitals:   06/03/18 1845 06/03/18 1900 06/03/18 1915 06/03/18 1930  BP: (!) 161/108 138/85    Pulse: 71 76 81 77  Resp: _0 Temp:      TempSrc:      SpO2: 100% 99% 96% 100%   Wt Readings from Last 10 Encounters:  01/19/18 96.2 kg (212 lb)  05/05/17 95 kg (209  lb 6.4 oz)  01/23/17 95.4 kg (210 lb 6.4 oz)  06/20/16 86.2 kg (190 lb)    PE:  General: No acute distress HEENT: Atraumatic, EOMI, mucous membranes moist. No JVD at 45 degrees. No HJR. CV: Irr irr rhythm with 3/6 SEM, no gallops Respiratory: Clear, no crackles. Normal work of breathing ABD: Non-distended and non-tender. No palpable organomegaly.  Extremities: 2+ radial pulses bilaterally. trivial edema. Neuro/Psych: CN grossly intact, alert and oriented  Assessment/Plan Paroxysmal atrial fibrillation Chest pain Significant pauses, ?  Post conversion Hypertension  I discussed with her at length use of her atenolol.  She denies taking any more than usual.  She has had a lot of emotional stress this month with her granddaughter passing away.  Luckily, at the  time of interview her chest pain is almost resolved she is not having any more active pauses.  We will bring her into the hospital and watch her very closely.  We will continue to cycle EKGs and troponin values.  We will keep pacing pads on and use as needed and hold beta-blockade.  Echo in the morning to screen for wall motion abn.  Unclear at this point if Takotsubo driving this, or if she is developing tachy/brady, or ischemic.    Chest Pain - S/P 325 ASA, IV UF Heparin (nstead of Eliquis) - TTE ordered - Tele bed - NPO q midnight as a precaution; timing of ischemic evaluation pending clinical course - Hold Beta Blocker due to pauses  Afib with pauses - Hold Eliquis, start IV hep - Hold atenolol - Observe on Tele  Depression - cont fluozetine  HTN - Continue losartan  Full Code confirmed with her in front of her daughter  Lolita Cram Means  MD 06/03/2018, 8:04 PM

## 2018-06-03 NOTE — ED Notes (Signed)
Attempted report 

## 2018-06-03 NOTE — ED Notes (Signed)
Cardiology at bedside (Dr. Lamona Curl)

## 2018-06-03 NOTE — ED Triage Notes (Signed)
Patient arrived by [facility] for chest heaviness that started around 5pm. Patient has hx of atrial fib. Complains of SOB and dizziness. Received 1 SL NTG from EMS. On arrival alert and oriented. Multiple EKG strips prior to arrival show pauses from 8-15 seconds. States increased personal stress. Takes elequis daily as prescribed

## 2018-06-03 NOTE — ED Provider Notes (Signed)
[facility] EMERGENCY DEPARTMENT Provider Note   CSN: [id number] Arrival date & time: 06/03/18  1759     History   Chief Complaint Chief Complaint  Patient presents with  . cardiac/block/pauses    HPI Faith Parker is a 84 y.o. female.  HPI Patient has history of atrial fibrillation.  She is anticoagulated on Eliquis.  She reports about 2 hours prior to calling EMS he started getting central chest pressure.  He also had shortness of breath.  She reports that she felt dizzy.  No loss of consciousness.  On EMS arrival patient is alert and oriented.  He was given 1 nitro for chest pain.  Patient was placed on monitor.  She had multiple 8 to 15-second pauses on route.  They report she did not lose consciousness and heart rate resumed spontaneously.  Must also remarked that she had periods of A. fib RVR up to rates of 120s.  On arrival, patient endorses ongoing chest pressure.  She has not been consistently aware of the pauses as they have occurred.  Patient denies positives on review of systems but has been under a lot of personal stress due to recent death of a grandchild.  Patient sees Dr. Harrington Challenger for cardiology and management of her A. fib. Past Medical History:  Diagnosis Date  . Anxiety   . Aortic regurgitation   . Gastritis   . GERD (gastroesophageal reflux disease)   . H/O artificial eye lens   . Hypertension   . Hypothyroid   . Left ventricular hypertrophy   . Major depression   . Osteoarthritis   . Osteoporosis   . Peptic ulcer disease   . Stress incontinence     Patient Active Problem List   Diagnosis Date Noted  . PAF (paroxysmal atrial fibrillation) ([facility]) 06/05/2018  . Chronic anticoagulation 06/05/2018  . Chronic UTI 06/05/2018  . Cardiac device in situ   . Chest pain of uncertain etiology   . Bradycardia 06/03/2018  . Thoracic aortic aneurysm [facility]): a. 4.1 cm ascending thoracic aortic aneurysm 01/23/17 01/23/2017  . Syncope 01/21/2017  .  Hypertension 01/21/2017  . Hypothyroidism 01/21/2017    Past Surgical History:  Procedure Laterality Date  . LEG SURGERY Right    multiple surgeries, unknown type  . PACEMAKER IMPLANT N/A 06/04/2018   Procedure: PACEMAKER IMPLANT;  Surgeon: Deboraha Sprang, MD;  Location: [facility];  Service: Cardiovascular;  Laterality: N/A;     OB History   None      Home Medications    Prior to Admission medications   Medication Sig Start Date End Date Taking? Authorizing Provider  ALPRAZolam Duanne Moron) 0.5 MG tablet Take 0.5 mg by mouth 3 (three) times daily as needed for anxiety.   Yes [provider]  apixaban (ELIQUIS) 5 MG TABS tablet Take 1 tablet (5 mg total) by mouth 2 (two) times daily. 01/29/18  Yes Fay Records, MD  atenolol (TENORMIN) 25 MG tablet Take 25 mg by mouth daily.    Yes [provider]  Cholecalciferol (VITAMIN D3) 5000 units TABS Take 5,000 Units by mouth daily.   Yes [provider]  FLUoxetine (PROZAC) 20 MG tablet Take 20 mg by mouth daily.   Yes [provider]  levothyroxine (SYNTHROID, LEVOTHROID) 112 MCG tablet Take 112 mcg by mouth daily before breakfast.   Yes [provider]  losartan (COZAAR) 100 MG tablet Take 100 mg by mouth daily.    Yes [provider]  Multiple Vitamins-Minerals (MULTIVITAMIN PO) Take 1 tablet by mouth daily.   Yes [provider]  Omega-3 Fatty Acids (FISH OIL) 1200 MG CAPS Take 4 capsules by mouth daily.   Yes [provider]  Polyethyl Glycol-Propyl Glycol (SYSTANE) 0.4-0.3 % SOLN Place 1 drop into both eyes at bedtime.   Yes [provider]  Probiotic Product (ALIGN PO) Take 1 capsule by mouth daily.    Yes [provider]  traMADol (ULTRAM) 50 MG tablet Take 50 mg by mouth every 6 (six) hours as needed for moderate pain.    Yes [provider]  vitamin B-12 (CYANOCOBALAMIN) 100 MCG tablet Take 100 mcg by mouth daily.   Yes [provider]  vitamin C (ASCORBIC ACID) 500 MG tablet Take 500 mg by mouth daily.   Yes [provider]    Family History Family History  Problem Relation Age of Onset  . Coronary artery disease Neg Hx     Social History Social History   Tobacco Use  . Smoking status: Never Smoker  . Smokeless tobacco: Never Used  Substance Use Topics  . Alcohol use: No  . Drug use: No     Allergies   Ciprofloxacin; Latex; Levofloxacin; Macrobid [nitrofurantoin monohyd macro]; Nitrofurantoin; and Sulfa antibiotics   Review of Systems Review of Systems 10 Systems reviewed and are negative for acute change except as noted in the HPI.   Physical Exam Updated Vital Signs BP (!) 141/76 (BP Location: Right Arm)   Pulse 66   Temp 98.2 F (36.8 C) (Oral)   Resp 19   Ht 5\' 8"  (1.727 m)   Wt 98.9 kg (218 lb 0.6 oz)   SpO2 97%   BMI 33.15 kg/m   Physical Exam  Constitutional: She is oriented to person, place, and time.  She is alert without respiratory distress.  She appears feel uncomfortable.  HENT:  Mouth/Throat: Oropharynx is clear and moist.  Eyes: EOM are normal.  Cardiovascular:  Heart is regular to auscultation.  Gross rub murmur gallop.  Pulmonary/Chest: Effort normal and breath sounds normal.  Abdominal: Soft. She exhibits no distension. There is no tenderness. There is no guarding.  Musculoskeletal: Normal range of motion. She exhibits no edema or tenderness.  Neurological: She is alert and oriented to person, place, and time. She exhibits normal muscle tone. Coordination normal.  Skin: Skin is warm and dry. There is pallor.     ED Treatments / Results  Labs (all labs ordered are listed, but only abnormal results are displayed) Labs Reviewed  COMPREHENSIVE METABOLIC PANEL - Abnormal; Notable for the following components:      Result Value   Potassium 3.4 (*)    Glucose, Bld 138 (*)    Total Protein 5.8 (*)    GFR calc non Af Amer 51 (*)    GFR calc Af  Amer 60 (*)    All other components within normal limits  LIPASE, BLOOD - Abnormal; Notable for the following components:   Lipase 52 (*)    All other components within normal limits  HEPARIN LEVEL (UNFRACTIONATED) - Abnormal; Notable for the following components:   Heparin Unfractionated 1.56 (*)    All other components within normal limits  BASIC METABOLIC PANEL - Abnormal; Notable for the following components:   Glucose, Bld 101 (*)    GFR calc non Af Amer 59 (*)    All other components within normal limits  I-STAT CHEM 8, ED - Abnormal; Notable  for the following components:   Potassium 3.3 (*)    Glucose, Bld 133 (*)    Calcium, Ion 1.08 (*)    All other components within normal limits  MRSA PCR SCREENING  SURGICAL PCR SCREEN  CBC WITH DIFFERENTIAL/PLATELET  PROTIME-INR  TSH  APTT  TROPONIN I  TROPONIN I  CBC  CBC  I-STAT TROPONIN, ED    EKG EKG Interpretation  Date/Time:  Sunday June 03 2018 18:27:09 EDT Ventricular Rate:  72 PR Interval:    QRS Duration: 84 QT Interval:  396 QTC Calculation: 434 R Axis:   31 Text Interpretation:  Atrial flutter Minimal ST depression, inferior leads Confirmed by Blanchie Dessert [phone]) on 06/04/2018 9:31:33 PM   Radiology No results found.  Procedures Procedures (including critical care time) CRITICAL CARE Performed by: Charlesetta Shanks   Total critical care time: 30 minutes  Critical care time was exclusive of separately billable procedures and treating other patients.  Critical care was necessary to treat or prevent imminent or life-threatening deterioration.  Critical care was time spent personally by me on the following activities: development of treatment plan with patient and/or surrogate as well as nursing, discussions with consultants, evaluation of patient's response to treatment, examination of patient, obtaining history from patient or surrogate, ordering and performing treatments and interventions, ordering and  review of laboratory studies, ordering and review of radiographic studies, pulse oximetry and re-evaluation of patient's condition. Medications Ordered in ED Medications  0.9 %  sodium chloride infusion ( Intravenous New Bag/Given 06/04/18 1532)  0.9 %  sodium chloride infusion ( Intravenous Stopped 06/03/18 2018)  aspirin tablet 325 mg (325 mg Oral Given 06/03/18 1928)  potassium chloride (K-DUR) CR tablet 20 mEq (20 mEq Oral Given 06/03/18 1913)  gentamicin (GARAMYCIN) 80 mg in sodium chloride 0.9 % 500 mL irrigation (80 mg Irrigation Given 06/04/18 1344)  chlorhexidine (HIBICLENS) 4 % liquid 4 application (4 application Topical Given 06/04/18 1227)  chlorhexidine (HIBICLENS) 4 % liquid 4 application (4 application Topical Given 06/04/18 1227)  ceFAZolin (ANCEF) IVPB 2g/100 mL premix (0 g Intravenous Stopped 06/04/18 1344)  ceFAZolin (ANCEF) IVPB 1 g/50 mL premix (1 g Intravenous New Bag/Given 06/05/18 0744)  heparin infusion 2 units/mL in 0.9 % sodium chloride (500 mLs Other New Bag/Given 06/04/18 1403)  you have a pacemaker book (1 each Does not apply Given 06/04/18 2100)     Initial Impression / Assessment and Plan / ED Course  I have reviewed the triage vital signs and the nursing notes.  Pertinent labs & imaging results that were available during my care of the patient were reviewed by me and considered in my medical decision making (see chart for details).    Consult: Cardiology consulted shortly after patient arrival.  Final Clinical Impressions(s) / ED Diagnoses   Final diagnoses:  Tachy-bradycardia syndrome with prolonged pauses Paroxysmal atrial fibrillation Precordial chest pain    Patient presents with chest pain having started at home.  She has episodes of lightheadedness.  EMS documented multiple episodes of prolonged pauses of 5 to 7 seconds.  Upon arrival to the emergency department the patient is alert and appropriate.  She has no respiratory distress.  On the monitor, she has been  observed to have a prolonged pause of 7 seconds.  She has had some variability from rate controlled rhythm in the 80s to occasional 120s.  Pauses typically are following a slower rate.  Patient has spontaneously recovered and not required transcutaneous pacing or atropine.  She has  been kept under close observation with cardiology consultation shortly upon arrival. ED Discharge Orders        Ordered    Increase activity slowly     06/05/18 1049    Diet - low sodium heart healthy     06/05/18 1049    Discharge instructions    Comments:  Please restart your Eliquis TOMORROW 06/06/18 and watch for surgical site bleeding. You have been set up with follow up appointments. Dates and times are attached.   06/05/18 1049    Call MD for:  temperature >100.4     06/05/18 1049    Call MD for:  severe uncontrolled pain     06/05/18 1049    Call MD for:  difficulty breathing, headache or visual disturbances     06/05/18 1049    Call MD for:  persistant dizziness or light-headedness     06/05/18 1049       Charlesetta Shanks, MD 06/07/18 2135

## 2018-06-03 NOTE — Progress Notes (Signed)
ANTICOAGULATION CONSULT NOTE - Initial Consult  Pharmacy Consult for heparin Indication: atrial fibrillation  Allergies  Allergen Reactions  . Ciprofloxacin Other (See Comments)    Unknown  . Latex Other (See Comments)    Unknown  . Levofloxacin Other (See Comments)    Unknown  . Macrobid [employer] Macro] Other (See Comments)    Unknown  . Nitrofurantoin Other (See Comments)    Unknown  . Sulfa Antibiotics Other (See Comments)    Unknown    Patient Measurements: Heparin Dosing Weight: 88 kg  Vital Signs: Temp: 99.4 F (37.4 C) (06/30 1807) Temp Source: Oral (06/30 1807) BP: 127/104 (06/30 2000) Pulse Rate: 60 (06/30 2000)  Labs: Recent Labs    06/03/18 1818  HGB 12.2  12.6  HCT 37.9  37.0  PLT 191  LABPROT 14.7  INR 1.16  CREATININE 0.97  0.90     Assessment: Pt is on eliquis PTA for AFIb, admitted with chest heaviness. Planning to start heparin for rule out ACS, beginning tomorrow morning.   Goal of Therapy:  Heparin level 0.3-0.7 units/ml Monitor platelets by anticoagulation protocol: Yes aPTT 66-102     Plan:  -Heparin infusion 1250 units/hr to start tomorrow at 0800 -Daily HL, CBC, aPTT -8 hour confirmatory level   Harvel Quale 06/03/2018,8:26 PM

## 2018-06-04 ENCOUNTER — Inpatient Hospital Stay (HOSPITAL_COMMUNITY): Payer: Medicare Other

## 2018-06-04 ENCOUNTER — Encounter (HOSPITAL_COMMUNITY): Payer: Self-pay | Admitting: Internal Medicine

## 2018-06-04 ENCOUNTER — Encounter (HOSPITAL_COMMUNITY)
Admission: EM | Disposition: A | Discharge status: Home / Self Care | Payer: Self-pay | Source: Home / Self Care | Attending: Internal Medicine

## 2018-06-04 DIAGNOSIS — R0789 Other chest pain: Secondary | ICD-10-CM

## 2018-06-04 DIAGNOSIS — I10 Essential (primary) hypertension: Secondary | ICD-10-CM

## 2018-06-04 DIAGNOSIS — I495 Sick sinus syndrome: Secondary | ICD-10-CM

## 2018-06-04 DIAGNOSIS — I480 Paroxysmal atrial fibrillation: Principal | ICD-10-CM

## 2018-06-04 DIAGNOSIS — I351 Nonrheumatic aortic (valve) insufficiency: Secondary | ICD-10-CM

## 2018-06-04 DIAGNOSIS — R079 Chest pain, unspecified: Secondary | ICD-10-CM

## 2018-06-04 DIAGNOSIS — I361 Nonrheumatic tricuspid (valve) insufficiency: Secondary | ICD-10-CM

## 2018-06-04 HISTORY — PX: PACEMAKER IMPLANT: EP1218

## 2018-06-04 LAB — BASIC METABOLIC PANEL
Anion gap: 8 (ref 5–15)
BUN: 13 mg/dL (ref 8–23)
CO2: 27 mmol/L (ref 22–32)
CREATININE: 0.86 mg/dL (ref 0.44–1.00)
Calcium: 8.9 mg/dL (ref 8.9–10.3)
Chloride: 102 mmol/L (ref 98–111)
GFR calc Af Amer: >60 mL/min (ref >60)
GFR, EST NON AFRICAN AMERICAN: 59 mL/min — AB (ref >60)
Glucose, Bld: 101 mg/dL — ABNORMAL HIGH (ref 70–99)
Potassium: 4.3 mmol/L (ref 3.5–5.1)
SODIUM: 137 mmol/L (ref 135–145)

## 2018-06-04 LAB — CBC
HCT: 39 % (ref 36.0–46.0)
Hemoglobin: 12.9 g/dL (ref 12.0–15.0)
MCH: 31.1 pg (ref 26.0–34.0)
MCHC: 33.1 g/dL (ref 30.0–36.0)
MCV: 94 fL (ref 78.0–100.0)
PLATELETS: 186 10*3/uL (ref 150–400)
RBC: 4.15 MIL/uL (ref 3.87–5.11)
RDW: 13.2 % (ref 11.5–15.5)
WBC: 5.7 10*3/uL (ref 4.0–10.5)

## 2018-06-04 LAB — TROPONIN I

## 2018-06-04 LAB — SURGICAL PCR SCREEN
MRSA, PCR: NEGATIVE
STAPHYLOCOCCUS AUREUS: NEGATIVE

## 2018-06-04 LAB — ECHOCARDIOGRAM COMPLETE
Height: 68 in
Weight: 3474.4496 oz

## 2018-06-04 SURGERY — PACEMAKER IMPLANT
Anesthesia: LOCAL

## 2018-06-04 MED ORDER — MIDAZOLAM HCL 5 MG/5ML IJ SOLN
INTRAMUSCULAR | Status: DC | PRN
  Administered 2018-06-04 (×3): 1 mg via INTRAVENOUS

## 2018-06-04 MED ORDER — CEFAZOLIN SODIUM-DEXTROSE 2-4 GM/100ML-% IV SOLN
INTRAVENOUS | Status: AC
Start: 2018-06-04 — End: ?
  Filled 2018-06-04: qty 100

## 2018-06-04 MED ORDER — SODIUM CHLORIDE 0.9 % IV SOLN: INTRAVENOUS | Status: DC

## 2018-06-04 MED ORDER — SODIUM CHLORIDE 0.9 % IV SOLN
80.0000 mg | INTRAVENOUS | Status: AC
  Administered 2018-06-04: 80 mg

## 2018-06-04 MED ORDER — ATENOLOL 25 MG PO TABS
25.0000 mg | ORAL_TABLET | Freq: Every day | ORAL | Status: DC
  Administered 2018-06-04 – 2018-06-05 (×2): 25 mg via ORAL
  Filled 2018-06-04 (×2): qty 1

## 2018-06-04 MED ORDER — MIDAZOLAM HCL 5 MG/5ML IJ SOLN
INTRAMUSCULAR | Status: AC
  Filled 2018-06-04: qty 5

## 2018-06-04 MED ORDER — FENTANYL CITRATE (PF) 100 MCG/2ML IJ SOLN
INTRAMUSCULAR | Status: AC
  Filled 2018-06-04: qty 2

## 2018-06-04 MED ORDER — CEFAZOLIN SODIUM-DEXTROSE 2-4 GM/100ML-% IV SOLN
2.0000 g | INTRAVENOUS | Status: AC
  Administered 2018-06-04: 2 g via INTRAVENOUS

## 2018-06-04 MED ORDER — LIDOCAINE HCL (PF) 1 % IJ SOLN
INTRAMUSCULAR | Status: DC | PRN
  Administered 2018-06-04: 40 mL

## 2018-06-04 MED ORDER — CHLORHEXIDINE GLUCONATE 4 % EX LIQD
60.0000 mL | Freq: Once | CUTANEOUS | Status: AC
  Administered 2018-06-04: 4 via TOPICAL
  Filled 2018-06-04: qty 15

## 2018-06-04 MED ORDER — CEFAZOLIN SODIUM-DEXTROSE 1-4 GM/50ML-% IV SOLN
1.0000 g | Freq: Four times a day (QID) | INTRAVENOUS | Status: AC
  Administered 2018-06-04 – 2018-06-05 (×3): 1 g via INTRAVENOUS
  Filled 2018-06-04 (×3): qty 50

## 2018-06-04 MED ORDER — ACETAMINOPHEN 325 MG PO TABS
325.0000 mg | ORAL_TABLET | ORAL | Status: DC | PRN
  Administered 2018-06-04: 650 mg via ORAL
  Filled 2018-06-04: qty 2

## 2018-06-04 MED ORDER — LIDOCAINE HCL (PF) 1 % IJ SOLN
INTRAMUSCULAR | Status: AC
  Filled 2018-06-04: qty 60

## 2018-06-04 MED ORDER — SODIUM CHLORIDE 0.9 % IV SOLN
INTRAVENOUS | Status: AC
  Filled 2018-06-04: qty 2

## 2018-06-04 MED ORDER — SODIUM CHLORIDE 0.9 % IV SOLN
INTRAVENOUS | Status: AC
  Administered 2018-06-04: 16:00:00 via INTRAVENOUS

## 2018-06-04 MED ORDER — ONDANSETRON HCL 4 MG/2ML IJ SOLN: 4.0000 mg | Freq: Four times a day (QID) | INTRAMUSCULAR | Status: DC | PRN

## 2018-06-04 MED ORDER — HEPARIN (PORCINE) IN NACL 2-0.9 UNITS/ML
INTRAMUSCULAR | Status: AC | PRN
  Administered 2018-06-04: 500 mL

## 2018-06-04 MED ORDER — CHLORHEXIDINE GLUCONATE 4 % EX LIQD
60.0000 mL | Freq: Once | CUTANEOUS | Status: AC
  Administered 2018-06-04: 4 via TOPICAL
  Filled 2018-06-04: qty 60

## 2018-06-04 MED ORDER — HEPARIN (PORCINE) IN NACL 1000-0.9 UT/500ML-% IV SOLN
INTRAVENOUS | Status: AC
  Filled 2018-06-04: qty 500

## 2018-06-04 MED ORDER — FENTANYL CITRATE (PF) 100 MCG/2ML IJ SOLN
INTRAMUSCULAR | Status: DC | PRN
  Administered 2018-06-04 (×2): 25 ug via INTRAVENOUS

## 2018-06-04 MED ORDER — YOU HAVE A PACEMAKER BOOK
Freq: Once | Status: AC
  Administered 2018-06-04: 21:00:00 1
  Filled 2018-06-04: qty 1

## 2018-06-04 MED FILL — Medication: Qty: 1 | Status: AC

## 2018-06-04 SURGICAL SUPPLY — 11 items
CABLE SURGICAL S-101-97-12 (CABLE) ×2 IMPLANT
HEMOSTAT SURGICEL 2X4 FIBR (HEMOSTASIS) ×2 IMPLANT
IPG PACE AZUR XT DR MRI W1DR01 (Pacemaker) ×1 IMPLANT
LEAD CAPSURE NOVUS 5076-52CM (Lead) ×2 IMPLANT
LEAD CAPSURE NOVUS 5076-58CM (Lead) ×2 IMPLANT
PACE AZURE XT DR MRI W1DR01 (Pacemaker) ×2 IMPLANT
PACK EP LATEX FREE (CUSTOM PROCEDURE TRAY)
PACK EP LF (CUSTOM PROCEDURE TRAY) IMPLANT
PAD DEFIB LIFELINK (PAD) ×2 IMPLANT
SHEATH CLASSIC 7F (SHEATH) ×4 IMPLANT
TRAY PACEMAKER INSERTION (PACKS) ×2 IMPLANT

## 2018-06-04 NOTE — Consult Note (Signed)
Cardiology Consultation:   Patient ID: Faith Parker; [id number]; Jun 11, 1930   Admit date: 06/03/2018 Date of Consult: 06/04/2018  Primary Care Provider: Leighton Ruff, MD Primary Cardiologist: Dr. Harrington Challenger Primary Electrophysiologist:  New, Dr. Caryl Comes   Patient Profile:   Faith Parker is a 84 y.o. female with a hx of PAFib, HTN, VHD w/mild AS, Hypothyroidism, who is being seen today for the evaluation of bradycardia, pauses at the request of Dr. Drema Dallas.  History of Present Illness:   [name] was admitted last evening with c/o CP, fatigue. To note she has been under a great deal of emotional upset and stress with death of her granddaughter.  This morning she is feeling much better, she feels like she can definitely tell the difference maintaining normal rhythm.  No ongoing CP, no palpitations or SOB.  She had some intermittent dizziness yesterday, though so fa not this AM.  No syncope.  She mentioned dizziness in her visit with Dr. Harrington Challenger in feb of this year though tells me this has not been an ongoing or recurrent problem.  She has trouble with recurrent bladder infections (has a bladder stimulator in place though turned off given did not help) and is on Keflex chronically she says for the last year that has been a blessing for her and doing well in controlling her UTIs  LABS K+ 3.4 BUN/Creat 15/0.97 poc Trop 0.00 Trop I: <0.03 WBC 5.7 H/H 12/39 Plts 186 TSH 0.473  HOME meds include: Atenolol 25mg  daily, held here  Past Medical History:  Diagnosis Date  . Anxiety   . Aortic regurgitation   . Gastritis   . GERD (gastroesophageal reflux disease)   . H/O artificial eye lens   . Hypertension   . Hypothyroid   . Left ventricular hypertrophy   . Major depression   . Osteoarthritis   . Osteoporosis   . Peptic ulcer disease   . Stress incontinence     Past Surgical History:  Procedure Laterality Date  . LEG SURGERY Right    multiple surgeries, unknown type        Inpatient Medications: Scheduled Meds: . cephALEXin  250 mg Oral Daily  . FLUoxetine  20 mg Oral Daily  . levothyroxine  112 mcg Oral QAC breakfast  . losartan  100 mg Oral Daily   Continuous Infusions:  PRN Meds: ALPRAZolam, traMADol  Allergies:    Allergies  Allergen Reactions  . Ciprofloxacin Other (See Comments)    Unknown  . Latex Other (See Comments)    Unknown  . Levofloxacin Other (See Comments)    Unknown  . Macrobid [employer] Macro] Other (See Comments)    Unknown  . Nitrofurantoin Other (See Comments)    Unknown  . Sulfa Antibiotics Other (See Comments)    Unknown    Social History:   Social History   Socioeconomic History  . Marital status: Widowed    Spouse name: Not on file  . Number of children: Not on file  . Years of education: Not on file  . Highest education level: Not on file  Occupational History  . Occupation: Retired  Scientific laboratory technician  . Financial resource strain: Not on file  . Food insecurity:    Worry: Not on file    Inability: Not on file  . Transportation needs:    Medical: Not on file    Non-medical: Not on file  Tobacco Use  . Smoking status: Never Smoker  . Smokeless tobacco: Never Used  Substance  and Sexual Activity  . Alcohol use: No  . Drug use: No  . Sexual activity: Not on file  Lifestyle  . Physical activity:    Days per week: Not on file    Minutes per session: Not on file  . Stress: Not on file  Relationships  . Social connections:    Talks on phone: Not on file    Gets together: Not on file    Attends religious service: Not on file    Active member of club or organization: Not on file    Attends meetings of clubs or organizations: Not on file    Relationship status: Not on file  . Intimate partner violence:    Fear of current or ex partner: Not on file    Emotionally abused: Not on file    Physically abused: Not on file    Forced sexual activity: Not on file  Other Topics Concern  . Not  on file  Social History Narrative   Pt in independent living apartment at [facility].    Family History:   Family History  Problem Relation Age of Onset  . Coronary artery disease Neg Hx      ROS:  Please see the history of present illness.  All other ROS reviewed and negative.     Physical Exam/Data:   Vitals:   06/04/18 0600 06/04/18 0700 06/04/18 0800 06/04/18 0819  BP: (!) 143/69 (!) 164/78 (!) 154/78   Pulse: 62 (!) 58 (!) 56   Resp: 19 14 10    Temp:    97.7 F (36.5 C)  TempSrc:    Oral  SpO2: 97% 98% 99%   Weight:      Height:        Intake/Output Summary (Last 24 hours) at 06/04/2018 0846 Last data filed at 06/04/2018 0800 Gross per 24 hour  Intake 190.7 ml  Output 2325 ml  Net -2134.3 ml   Filed Weights   06/03/18 2000 06/03/18 2047  Weight: 212 lb (96.2 kg) 217 lb 2.4 oz (98.5 kg)   Body mass index is 33.02 kg/m.  General:  Well nourished, well developed, in no acute distress HEENT: normal Lymph: no adenopathy Neck: no JVD Endocrine:  No thryomegaly Vascular: No carotid bruits Cardiac:  RRR; 1-2/6 SM, no gallops or rubs Lungs:  CTA b/l, no wheezing, rhonchi or rales  Abd: soft, nontender Ext: no edema Musculoskeletal:  No deformities, age appropriate atrophy Skin: warm and dry  Neuro:   No gross focal abnormalities noted Psych:  Normal affect, very plesent  EKG:  The EKG was personally reviewed and demonstrates:   AFib 96bpm, nonspecific ST changes Telemetry:  Telemetry was personally reviewed and demonstrates:   Rate controlled PAFib 60's with post-termination pauses up to 7.6 seconds, SR/SB rates generally 60's  Relevant CV Studies:  New echo is ordered (asked to be done ASAP)  01/29/18: TTE Study Conclusions - Left ventricle: The cavity size was normal. Wall thickness was   increased in a pattern of mild LVH. Systolic function was   vigorous. The estimated ejection fraction was in the range of 65%   to 70%. Wall motion was normal;  there were no regional wall   motion abnormalities. Features are consistent with a pseudonormal   left ventricular filling pattern, with concomitant abnormal   relaxation and increased filling pressure (grade 2 diastolic   dysfunction). - Aortic valve: Mildly calcified annulus. Moderately thickened,   moderately calcified leaflets. There was mild stenosis. Valve  area (VTI): 1.54 cm^2. Valve area (Vmax): 1.59 cm^2. Valve area   (Vmean): 1.83 cm^2. - Mitral valve: There was mild to moderate regurgitation. - Pulmonary arteries: Systolic pressure was mildly increased. PA   peak pressure: 32 mm Hg (S).   Laboratory Data:  Chemistry Recent Labs  Lab 06/03/18 1818  NA 136  136  K 3.4*  3.3*  CL 101  99  CO2 26  GLUCOSE 138*  133*  BUN 15  19  CREATININE 0.97  0.90  CALCIUM 8.9  GFRNONAA 51*  GFRAA 60*  ANIONGAP 9    Recent Labs  Lab 06/03/18 1818  PROT 5.8*  ALBUMIN 3.5  AST 20  ALT 14  ALKPHOS 47  BILITOT 0.6   Hematology Recent Labs  Lab 06/03/18 1818 06/04/18 0827  WBC 5.7 5.7  RBC 3.98 4.15  HGB 12.2  12.6 12.9  HCT 37.9  37.0 39.0  MCV 95.2 94.0  MCH 30.7 31.1  MCHC 32.2 33.1  RDW 13.2 13.2  PLT 191 186   Cardiac Enzymes Recent Labs  Lab 06/03/18 2123  TROPONINI <0.03    Recent Labs  Lab 06/03/18 1817  TROPIPOC 0.00    BNPNo results for input(s): BNP, PROBNP in the last 168 hours.  DDimer No results for input(s): DDIMER in the last 168 hours.  Radiology/Studies:   Dg Chest Port 1 View Result Date: 06/03/2018 CLINICAL DATA:  Chest heaviness beginning this afternoon which shortness-of-breath. EXAM: PORTABLE CHEST 1 VIEW COMPARISON:  None. FINDINGS: Lungs are adequately inflated without focal airspace consolidation or effusion. Cardiomediastinal silhouette and remainder of the exam is unchanged. IMPRESSION: No active disease. Electronically Signed   By: Marin Olp M.D.   On: 06/03/2018 18:48    Assessment and Plan:   1. PAFib  (rate controlled) with post termination pauses     Pauses 2-7.6 seconds     CHA2DS2Vasc is 4, on Eliquis (held here for pacer implant)  Last dose of Atenolol was yesterday morning Last dose of Eliquis was yesterday AM  Dr. Caryl Comes saw and spoke with patient yesterday regarding PPM implant procedure, risks and beneifts I reviewed case this am with him as well. Plans are for PPM implant this afternoon w/Dr. Caryl Comes  I re-discussed the PPM implant procedure, it's risk/benefits with the patient, she is agreeable to proceed  2. CP     Not ongoing     No clear ischemic looking changes in her EKGs     Trop negative      Echo is pending  3. HTN     Looks OK, resume her BB post pacer  4. Chronic/recurrent UTI's     Well controlled per patient this past year now chronically on Keflex     Denies symptoms of current infection     WBC wnl     afebrile   For questions or updates, please contact [place] Please consult [email or web address] for contact info under Cardiology/STEMI.   Signed, Baldwin [place], PA-C  06/04/2018 8:46 AM

## 2018-06-04 NOTE — Progress Notes (Signed)
  Echocardiogram 2D Echocardiogram has been performed.  Madelaine Etienne 06/04/2018, 9:54 AM

## 2018-06-04 NOTE — Progress Notes (Signed)
DAILY PROGRESS NOTE   Patient Name: Faith Parker Date of Encounter: 06/04/2018  Chief Complaint   Syncope, palpitations  Patient Profile   Faith Parker is an 83 year old woman with a history of atrial fibrillation, HTN, mild aortic stenosis who presented with bradycardia and pauses after an episode of syncope.  Subjective   Faith Parker reports that yesterday while at home she had an episode of palpitations, and a neighbor who is a nurse noted that her heart rate was very fast. She thinks she may have lost consciousness at some point, but the details are unclear. EMS was called, and it was reported that she had intermittent rapid heart rates with intermittent pauses, up to 7 seconds in length. Since admission, telemetry reveals multiple pauses between 4-6 seconds in length.  ROS is positive for general fatigue for the last week. She has had significant emotional stress with the loss of her granddaughter. Denies fevers, chills. Is on chronic keflex but denies current UTI. No chest pain, no current shortness of breath. Felt lightheaded yesterday but not today. No melena or hematochezia, no hematuria or dysuria. Last dose of apixaban was yesterday AM.   Objective   Vitals:   06/04/18 0900 06/04/18 1000 06/04/18 1100 06/04/18 1200  BP: (!) 157/76  (!) 152/77 (!) 152/94  Pulse: 69 63 62 (!) 52  Resp: 14 18 13 16   Temp:      TempSrc:      SpO2: 97% 97% 96% 97%  Weight:      Height:        Intake/Output Summary (Last 24 hours) at 06/04/2018 1336 Last data filed at 06/04/2018 1200 Gross per 24 hour  Intake 250.7 ml  Output 2525 ml  Net -2274.3 ml   Filed Weights   06/03/18 2000 06/03/18 2047  Weight: 212 lb (96.2 kg) 217 lb 2.4 oz (98.5 kg)    Physical Exam   General appearance: alert, cooperative and appears stated age Neck: no JVD, supple, symmetrical, trachea midline and thyroid not enlarged, symmetric, no tenderness/mass/nodules Lungs: clear to auscultation bilaterally Heart:  regular rate and rhythm, S1, S2 normal, no S3 or S4, systolic murmur: early systolic 1/6, crescendo and decrescendo at 2nd right intercostal space and no rub Abdomen: soft, non-tender; bowel sounds normal; no masses,  no organomegaly Extremities: extremities normal, atraumatic, no cyanosis or edema Pulses: 2+ and symmetric radial and DP bilaterally Skin: Skin color, texture, turgor normal. No rashes or lesions Neurologic: Grossly normal  Inpatient Medications    Scheduled Meds:  [MAR Hold] cephALEXin  250 mg Oral Daily   [MAR Hold] FLUoxetine  20 mg Oral Daily   gentamicin irrigation  80 mg Irrigation To Cath   [MAR Hold] levothyroxine  112 mcg Oral QAC breakfast   [MAR Hold] losartan  100 mg Oral Daily    Continuous Infusions:  sodium chloride     sodium chloride      PRN Meds: [MAR Hold] ALPRAZolam, fentaNYL, lidocaine (PF), midazolam, [MAR Hold] traMADol   Labs   Results for orders placed or performed during the hospital encounter of 06/03/18 (from the past 48 hour(s))  I-stat troponin, ED     Status: None   Collection Time: 06/03/18  6:17 PM  Result Value Ref Range   Troponin i, poc 0.00 0.00 - 0.08 ng/mL   Comment 3            Comment: Due to the release kinetics of cTnI, a negative result within the first  hours of the onset of symptoms does not rule out myocardial infarction with certainty. If myocardial infarction is still suspected, repeat the test at appropriate intervals.   I-stat Chem 8, ED     Status: Abnormal   Collection Time: 06/03/18  6:18 PM  Result Value Ref Range   Sodium 136 135 - 145 mmol/L   Potassium 3.3 (L) 3.5 - 5.1 mmol/L   Chloride 99 98 - 111 mmol/L   BUN 19 8 - 23 mg/dL   Creatinine, Ser 0.90 0.44 - 1.00 mg/dL   Glucose, Bld 133 (H) 70 - 99 mg/dL   Calcium, Ion 1.08 (L) 1.15 - 1.40 mmol/L   TCO2 24 22 - 32 mmol/L   Hemoglobin 12.6 12.0 - 15.0 g/dL   HCT 37.0 36.0 - 46.0 %  Comprehensive metabolic panel     Status: Abnormal    Collection Time: 06/03/18  6:18 PM  Result Value Ref Range   Sodium 136 135 - 145 mmol/L   Potassium 3.4 (L) 3.5 - 5.1 mmol/L   Chloride 101 98 - 111 mmol/L    Comment: Please note change in reference range.   CO2 26 22 - 32 mmol/L   Glucose, Bld 138 (H) 70 - 99 mg/dL    Comment: Please note change in reference range.   BUN 15 8 - 23 mg/dL    Comment: Please note change in reference range.   Creatinine, Ser 0.97 0.44 - 1.00 mg/dL   Calcium 8.9 8.9 - 10.3 mg/dL   Total Protein 5.8 (L) 6.5 - 8.1 g/dL   Albumin 3.5 3.5 - 5.0 g/dL   AST 20 15 - 41 U/L   ALT 14 0 - 44 U/L    Comment: Please note change in reference range.   Alkaline Phosphatase 47 38 - 126 U/L   Total Bilirubin 0.6 0.3 - 1.2 mg/dL   GFR calc non Af Amer 51 (L) >60 mL/min   GFR calc Af Amer 60 (L) >60 mL/min    Comment: (NOTE) The eGFR has been calculated using the CKD EPI equation. This calculation has not been validated in all clinical situations. eGFR's persistently <60 mL/min signify possible Chronic Kidney Disease.    Anion gap 9 5 - 15    Comment: Performed at [facility] [address], [place], [place] [address]  Lipase, blood     Status: Abnormal   Collection Time: 06/03/18  6:18 PM  Result Value Ref Range   Lipase 52 (H) 11 - 51 U/L    Comment: Performed at [facility] [address], [place], [place] [address]  CBC with Differential     Status: None   Collection Time: 06/03/18  6:18 PM  Result Value Ref Range   WBC 5.7 4.0 - 10.5 K/uL   RBC 3.98 3.87 - 5.11 MIL/uL   Hemoglobin 12.2 12.0 - 15.0 g/dL   HCT 37.9 36.0 - 46.0 %   MCV 95.2 78.0 - 100.0 fL   MCH 30.7 26.0 - 34.0 pg   MCHC 32.2 30.0 - 36.0 g/dL   RDW 13.2 11.5 - 15.5 %   Platelets 191 150 - 400 K/uL   Neutrophils Relative % 69 %   Neutro Abs 3.9 1.7 - 7.7 K/uL   Lymphocytes Relative 21 %   Lymphs Abs 1.2 0.7 - 4.0 K/uL   Monocytes Relative 8 %   Monocytes Absolute 0.5 0.1 - 1.0 K/uL   Eosinophils Relative 1 %  Eosinophils Absolute 0.1 0.0 - 0.7 K/uL   Basophils Relative 1 %   Basophils Absolute 0.0 0.0 - 0.1 K/uL   Immature Granulocytes 0 %   Abs Immature Granulocytes 0.0 0.0 - 0.1 K/uL    Comment: Performed at [facility] [address] [place], [place] [address]  Protime-INR     Status: None   Collection Time: 06/03/18  6:18 PM  Result Value Ref Range   Prothrombin Time 14.7 11.4 - 15.2 seconds   INR 1.16     Comment: Performed at [facility] [address], [place], [place] [address]  TSH     Status: None   Collection Time: 06/03/18  6:18 PM  Result Value Ref Range   TSH 0.473 0.350 - 4.500 uIU/mL    Comment: Performed by a 3rd Generation assay with a functional sensitivity of <=0.01 uIU/mL. Performed at [facility], [place] [address], [place], [place] [address]   MRSA PCR Screening     Status: None   Collection Time: 06/03/18  8:56 PM  Result Value Ref Range   MRSA by PCR NEGATIVE NEGATIVE    Comment:        The GeneXpert MRSA Assay (FDA approved for NASAL specimens only), is one component of a comprehensive MRSA colonization surveillance program. It is not intended to diagnose MRSA infection nor to guide or monitor treatment for MRSA infections. Performed at [facility], [place] [address] [place], [place] [address]   Heparin level (unfractionated)     Status: Abnormal   Collection Time: 06/03/18  9:23 PM  Result Value Ref Range   Heparin Unfractionated 1.56 (H) 0.30 - 0.70 IU/mL    Comment: RESULTS CONFIRMED BY MANUAL DILUTION Performed at [facility], [place] [address] [place], [place] [address]   APTT     Status: None   Collection Time: 06/03/18  9:23 PM  Result Value Ref Range   aPTT 32 24 - 36 seconds    Comment: Performed at [facility] [address] [place], [place] [address]  Troponin I     Status: None   Collection Time: 06/03/18  9:23 PM  Result Value Ref Range   Troponin I <0.03 <0.03 ng/mL    Comment: Performed at  [facility] [address] [place],  [address]  Troponin I     Status: None   Collection Time: 06/04/18  8:27 AM  Result Value Ref Range   Troponin I <0.03 <0.03 ng/mL    Comment: Performed at [facility] [address] [place],  [address]  Basic metabolic panel     Status: Abnormal   Collection Time: 06/04/18  8:27 AM  Result Value Ref Range   Sodium 137 135 - 145 mmol/L   Potassium 4.3 3.5 - 5.1 mmol/L   Chloride 102 98 - 111 mmol/L    Comment: Please note change in reference range.   CO2 27 22 - 32 mmol/L   Glucose, Bld 101 (H) 70 - 99 mg/dL    Comment: Please note change in reference range.   BUN 13 8 - 23 mg/dL    Comment: Please note change in reference range.   Creatinine, Ser 0.86 0.44 - 1.00 mg/dL   Calcium 8.9 8.9 - 10.3 mg/dL   GFR calc non Af Amer 59 (L) >60 mL/min   GFR calc Af Amer >60 >60 mL/min    Comment: (NOTE) The  eGFR has been calculated using the CKD EPI equation. This calculation has not been validated in all clinical situations. eGFR's persistently <60 mL/min signify possible Chronic Kidney Disease.    Anion gap 8 5 - 15    Comment: Performed at [facility] [address], [place], [place] [address]  CBC     Status: None   Collection Time: 06/04/18  8:27 AM  Result Value Ref Range   WBC 5.7 4.0 - 10.5 K/uL   RBC 4.15 3.87 - 5.11 MIL/uL   Hemoglobin 12.9 12.0 - 15.0 g/dL   HCT 39.0 36.0 - 46.0 %   MCV 94.0 78.0 - 100.0 fL   MCH 31.1 26.0 - 34.0 pg   MCHC 33.1 30.0 - 36.0 g/dL   RDW 13.2 11.5 - 15.5 %   Platelets 186 150 - 400 K/uL    Comment: Performed at [facility], [place] [address], [place], [place] [address]  Surgical PCR screen     Status: None   Collection Time: 06/04/18 12:02 PM  Result Value Ref Range   MRSA, PCR NEGATIVE NEGATIVE   Staphylococcus aureus NEGATIVE NEGATIVE    Comment: (NOTE) The Xpert SA Assay (FDA approved for NASAL specimens in patients 65 years of age and older), is one  component of a comprehensive surveillance program. It is not intended to diagnose infection nor to guide or monitor treatment. Performed at [facility], [place] [address] [place], [place] [address]     ECG   Atrial fibrillation - Personally Reviewed  Telemetry   Now in normal sinus rhythm, multiple pauses >4 sec - Personally Reviewed  Radiology    Dg Chest Port 1 View  Result Date: 06/03/2018 CLINICAL DATA:  Chest heaviness beginning this afternoon which shortness-of-breath. EXAM: PORTABLE CHEST 1 VIEW COMPARISON:  None. FINDINGS: Lungs are adequately inflated without focal airspace consolidation or effusion. Cardiomediastinal silhouette and remainder of the exam is unchanged. IMPRESSION: No active disease. Electronically Signed   By: Marin Olp M.D.   On: 06/03/2018 18:48    Cardiac Studies   Echo pending  Assessment   1. Active Problems: 2.   Bradycardia 3. Concern for stress induced cardiomyopathy  Plan   1. EP evaluated patient, plan for pacemaker this afternoon. Apixaban has been held, last dose yesterday AM. Atenolol has been held as well.  2. Chest pain resolved, troponins negative x2. Echo pending to evaluate for possible stress induced cardiomyopathy. Follow up will be based on results. 3. Atrial fibrillation: Chadsvasc=4, holding anticoagulation for procedure, will restart when appropriate post procedure. Will be able to rate control once PPM implanted. 4. Hypertension: will restart BBlocker post PPM 5. Chronic/recurrent UTI: on chronic keflex. Denies active signs of infection. White count normal.   Time Spent Directly with Patient:  I have spent a total of >35 minutes with the patient reviewing hospital notes, telemetry, EKGs, labs and examining the patient as well as establishing an assessment and plan that was discussed personally with the patient.  > 50% of time was spent in direct patient care.  Length of Stay:  LOS: 1 day   Buford Dresser, MD, PhD [facility]   Bridgette A Christopher 06/04/2018, 1:36 PM

## 2018-06-05 ENCOUNTER — Inpatient Hospital Stay (HOSPITAL_COMMUNITY): Payer: Medicare Other

## 2018-06-05 DIAGNOSIS — Z959 Presence of cardiac and vascular implant and graft, unspecified: Secondary | ICD-10-CM

## 2018-06-05 DIAGNOSIS — I480 Paroxysmal atrial fibrillation: Secondary | ICD-10-CM

## 2018-06-05 DIAGNOSIS — N390 Urinary tract infection, site not specified: Secondary | ICD-10-CM

## 2018-06-05 DIAGNOSIS — Z7901 Long term (current) use of anticoagulants: Secondary | ICD-10-CM

## 2018-06-05 DIAGNOSIS — I4891 Unspecified atrial fibrillation: Secondary | ICD-10-CM

## 2018-06-05 LAB — CBC
HEMATOCRIT: 39.2 % (ref 36.0–46.0)
Hemoglobin: 12.5 g/dL (ref 12.0–15.0)
MCH: 30.3 pg (ref 26.0–34.0)
MCHC: 31.9 g/dL (ref 30.0–36.0)
MCV: 95.1 fL (ref 78.0–100.0)
Platelets: 179 10*3/uL (ref 150–400)
RBC: 4.12 MIL/uL (ref 3.87–5.11)
RDW: 13.3 % (ref 11.5–15.5)
WBC: 5.6 10*3/uL (ref 4.0–10.5)

## 2018-06-05 NOTE — Discharge Instructions (Addendum)
° ° °  Supplemental Discharge Instructions for  Pacemaker/Defibrillator Patients  Activity No heavy lifting or vigorous activity with your left/right arm for 6 to 8 weeks.  Do not raise your left/right arm above your head for one week.  Gradually raise your affected arm as drawn below.              06/08/18                      06/09/18                      06/10/18                    06/11/18 __  NO DRIVING (patient does not drive)  WOUND CARE - Keep the wound area clean and dry.  Do not get this area wet for 24 hours. No showers for 24 hours; you may shower on 06/05/17 evening  . - The tape/steri-strips on your wound will fall off; do not pull them off.  No bandage is needed on the site.  DO  NOT apply any creams, oils, or ointments to the wound area. - If you notice any drainage or discharge from the wound, any swelling or bruising at the site, or you develop a fever > 101? F after you are discharged home, call the office at once.  Special Instructions - You are still able to use cellular telephones; use the ear opposite the side where you have your pacemaker/defibrillator.  Avoid carrying your cellular phone near your device. - When traveling through airports, show security personnel your identification card to avoid being screened in the metal detectors.  Ask the security personnel to use the hand wand. - Avoid arc welding equipment, MRI testing (magnetic resonance imaging), TENS units (transcutaneous nerve stimulators).  Call the office for questions about other devices. - Avoid electrical appliances that are in poor condition or are not properly grounded. - Microwave ovens are safe to be near or to operate.  Additional information for defibrillator patients should your device go off: - If your device goes off ONCE and you feel fine afterward, notify the device clinic nurses. - If your device goes off ONCE and you do not feel well afterward, call 911. - If your device goes off TWICE, call  911. - If your device goes off THREE times in one day, call 911.  DO NOT DRIVE YOURSELF OR A FAMILY MEMBER WITH A DEFIBRILLATOR TO THE HOSPITAL--CALL 911.

## 2018-06-05 NOTE — Progress Notes (Signed)
Progress Note  Patient Name: Faith Parker Date of Encounter: 06/05/2018  Primary Cardiologist: Dr. Dorris Carnes, MD/ Dr. Virl Axe, MD  Subjective   Pt feeling well this AM. Denies chest pain, SOB or palpitations  Inpatient Medications    Scheduled Meds: . atenolol  25 mg Oral Daily  . cephALEXin  250 mg Oral Daily  . FLUoxetine  20 mg Oral Daily  . levothyroxine  112 mcg Oral QAC breakfast  . losartan  100 mg Oral Daily   Continuous Infusions: .  ceFAZolin (ANCEF) IV 1 g (06/05/18 0744)   PRN Meds: acetaminophen, ALPRAZolam, ondansetron (ZOFRAN) IV, traMADol   Vital Signs    Vitals:   06/04/18 2100 06/05/18 0344 06/05/18 0420 06/05/18 0553  BP:   (!) 185/102 137/75  Pulse: 63 61    Resp: 17 17  16   Temp:  97.7 F (36.5 C)    TempSrc:  Oral    SpO2: 96% 96%  96%  Weight:  218 lb 0.6 oz (98.9 kg)    Height:        Intake/Output Summary (Last 24 hours) at 06/05/2018 0800 Last data filed at 06/05/2018 0416 Gross per 24 hour  Intake 420 ml  Output 750 ml  Net -330 ml   Filed Weights   06/03/18 2000 06/03/18 2047 06/05/18 0344  Weight: 212 lb (96.2 kg) 217 lb 2.4 oz (98.5 kg) 218 lb 0.6 oz (98.9 kg)   Physical Exam   General: Well developed, well nourished, NAD Skin: Warm, dry, intact  Head: Normocephalic, atraumatic, clear, moist mucus membranes. Neck: Negative for carotid bruits. No JVD Lungs:Clear to ausculation bilaterally. No wheezes, rales, or rhonchi. Breathing is unlabored. Cardiovascular: RRR with S1 S2. No murmurs, rubs or gallops Abdomen: Soft, non-tender, non-distended with normoactive bowel sounds. No obvious abdominal masses. MSK: Strength and tone appear normal for age. 5/5 in all extremities Extremities: No edema. No clubbing or cyanosis. DP/PT pulses 2+ bilaterally Neuro: Alert and oriented. No focal deficits. No facial asymmetry. MAE spontaneously. Psych: Responds to questions appropriately with normal affect.    Labs     Chemistry Recent Labs  Lab 06/03/18 1818 06/04/18 0827  NA 136  136 137  K 3.4*  3.3* 4.3  CL 101  99 102  CO2 26 27  GLUCOSE 138*  133* 101*  BUN 15  19 13   CREATININE 0.97  0.90 0.86  CALCIUM 8.9 8.9  PROT 5.8*  --   ALBUMIN 3.5  --   AST 20  --   ALT 14  --   ALKPHOS 47  --   BILITOT 0.6  --   GFRNONAA 51* 59*  GFRAA 60* >60  ANIONGAP 9 8     Hematology Recent Labs  Lab 06/03/18 1818 06/04/18 0827 06/05/18 0207  WBC 5.7 5.7 5.6  RBC 3.98 4.15 4.12  HGB 12.2  12.6 12.9 12.5  HCT 37.9  37.0 39.0 39.2  MCV 95.2 94.0 95.1  MCH 30.7 31.1 30.3  MCHC 32.2 33.1 31.9  RDW 13.2 13.2 13.3  PLT 191 186 179    Cardiac Enzymes Recent Labs  Lab 06/03/18 2123 06/04/18 0827  TROPONINI <0.03 <0.03    Recent Labs  Lab 06/03/18 1817  TROPIPOC 0.00     BNPNo results for input(s): BNP, PROBNP in the last 168 hours.   DDimer No results for input(s): DDIMER in the last 168 hours.   Radiology    Dg Chest 2 View  Result  Date: 06/05/2018 CLINICAL DATA:  Pacemaker insertion EXAM: CHEST - 2 VIEW COMPARISON:  06/03/2018 FINDINGS: Interval placement of left pacer with leads in the right atrium and right ventricle. No pneumothorax. There is hyperinflation of the lungs compatible with COPD. Mild cardiomegaly. No confluent airspace opacities or effusions. IMPRESSION: Left pacer placement without pneumothorax. COPD, cardiomegaly. No active disease. Electronically Signed   By: Rolm Baptise M.D.   On: 06/05/2018 07:36   Dg Chest Port 1 View  Result Date: 06/03/2018 CLINICAL DATA:  Chest heaviness beginning this afternoon which shortness-of-breath. EXAM: PORTABLE CHEST 1 VIEW COMPARISON:  None. FINDINGS: Lungs are adequately inflated without focal airspace consolidation or effusion. Cardiomediastinal silhouette and remainder of the exam is unchanged. IMPRESSION: No active disease. Electronically Signed   By: Marin Olp M.D.   On: 06/03/2018 18:48   Telemetry    06/05/18  Atrial paced, NSR - Personally Reviewed  ECG    No new tracing as of 06/05/18 - Personally Reviewed  Cardiac Studies   Echocardiogram 06/04/18: Study Conclusions  - Left ventricle: The cavity size was normal. Wall thickness was   normal. Systolic function was vigorous. The estimated ejection   fraction was in the range of 65% to 70%. Features are consistent   with a pseudonormal left ventricular filling pattern, with   concomitant abnormal relaxation and increased filling pressure   (grade 2 diastolic dysfunction). - Aortic valve: There was mild stenosis. Valve area (VTI): 2.02   cm^2. Valve area (Vmax): 1.76 cm^2. Valve area (Vmean): 1.81   cm^2. - Left atrium: The atrium was moderately dilated.  Patient Profile     84 y.o. female  with history of paroxysmal atrial fibrillation on Eliquis and atenolol, hypertension, hyperthyroidism, aortic stenosis-mild by echo February 2019-she presented to the emergency room with chest discomfort throughout the afternoon and pauses on telemetry.  Assessment & Plan    1. Chest pain with syncope: -Pt recently lost her 75yo granddaughter and presented to [facility] on 036/30/19 with chest pain and palpitations  -Pt found to be in AF with pauses>>>>EP consulted for PPM placement, performed 06/04/18 -Trop, negative x2 -EKG without acute ischemic changes  -Echocardiogram with LVEF of 60-65% with G2DD 06/04/18 -Denies chest pain today, no recurrence since admission    2. Paroxsymal atrial fibrillation with pauses/bradycardia: -Pt [facility] to [facility] on [facility]/03[facility]18 with c/o chest pain with pauses on telemetry>>EP consulted given symptoms>>Apaxiban held given anticipated procedure  -Initial EKG with atrial fibrillation/flutter with episodes of 5-7 second pauses.  -Eliquis was held in anticipation of PPM>>placed 06/04/18 secondary to above>>>will restart once cleared to restart from EP  -Echocardiogram with LVEF of 65-70% with G2DD -Maintaining SR>>>ok to  resume Eliquis tomorrow, 06/06/18 per EP  -Chadsvasc=4  3. Depression: -Stable -Continue fluoxetine   4. HTN: -Elevated, 141/76, 137/75, 185/102 (last evening) -Atenolol restarted, will continue with losartan   5. Chronic UTI: -Pt currently on Keflex without s/s of active infection.  -WBC WNL   6. COPD mention on CXR: -No breathing problems -Will need to follow with PCP    Signed, Kathyrn Drown NP-C [facility] Pager: [phone] 06/05/2018, 8:00 AM     For questions or updates, please contact   Please consult [email or web address] for contact info under Cardiology/STEMI.

## 2018-06-05 NOTE — Discharge Summary (Signed)
.     Discharge Summary    Patient ID: Faith Parker,  MRN: [id number], DOB/AGE: Jan 28, 1930 85 y.o.  Admit date: 06/03/2018 Discharge date: 06/05/2018  Primary Care Provider: Leighton Ruff Primary Cardiologist: Dorris Carnes, MD  Discharge Diagnoses    Principal Problem:   Bradycardia Active Problems:   Chest pain of uncertain etiology   Cardiac device in situ   PAF (paroxysmal atrial fibrillation) (HCC)   Chronic anticoagulation   Chronic UTI  Allergies Allergies  Allergen Reactions  . Ciprofloxacin Other (See Comments)    Unknown  . Latex Other (See Comments)    Unknown  . Levofloxacin Other (See Comments)    Unknown  . Macrobid [employer] Macro] Other (See Comments)    Unknown  . Nitrofurantoin Other (See Comments)    Unknown  . Sulfa Antibiotics Other (See Comments)    Unknown    Diagnostic Studies/Procedures    Echocardiogram 06/04/18: Study Conclusions  - Left ventricle: The cavity size was normal. Wall thickness was normal. Systolic function was vigorous. The estimated ejection fraction was in the range of 65% to 70%. Features are consistent with a pseudonormal left ventricular filling pattern, with concomitant abnormal relaxation and increased filling pressure (grade 2 diastolic dysfunction). - Aortic valve: There was mild stenosis. Valve area (VTI): 2.02 cm^2. Valve area (Vmax): 1.76 cm^2. Valve area (Vmean): 1.81 cm^2. - Left atrium: The atrium was moderately dilated.   06/04/18: Medtronic PPM placement per Dr. Caryl Comes    History of Present Illness     Faith Parker is an 84 year old female with a history of paroxysmal atrial fibrillation on Eliquis and atenolol, hypertension, hyperthyroidism, chronic UTI and aortic stenosis who presented to [facility] on 06/03/2018 with chest discomfort and found to have significant pauses on telemetry. EP consulted.   Hospital Course     In the ED, she was found to be bradycardic with pauses  on telemetry.  She reported that it has been a challenging month for her and her family, given that her granddaughter who is 61 years old passed away. She reported less energy in the week prior to admission without specific chest pain. On the day of admission, she began developing more significant chest discomfort which prompted her to call emergency services for transport with associated mild shortness of breath. She reports that she is typically unaware of her atrial fibrillation or arrhythmias. During transport to the ED, she was noted to have several 5-7 second pauses and a couple of episodes of rapid ventricular response from atrial fibrillation up to the 120s. It was noted that she felt lightheaded during these episodes she reported feeling periods of lightheadedness and dizziness prior to admission.  Her EKG revealed atrial fib/flutter with some nonspecific ST segment changes.  A second EKG was performed which was obtained during an episode of a long pause. Troponins were drawn given her complaints of chest discomfort which were negative x2. Once stabilized, she had no recurrent episodes of chest discomfort.  Her beta-blocker and Eliquis were held in anticipation of PPM placement per EP. Medtronic PPM placed on 66/59/93 without complication. EP ok for discharge today, 06/05/18. Device interrogated this AM. Mild bruising at site. Follow up appointments have been made. PPM discharge instructions reviewed.   Other hospital problems include: 1. Chest pain with syncope: -Pt recently lost her 25yo granddaughter and presented to [facility] on 036/30/19 with chest pain and palpitations  -Pt found to be in AF with pauses>>>>EP consulted for PPM placement, performed 06/04/18.  Small bruising at [facility] site, EP okay for discharge.  Restart Eliquis on 06/06/2018. -Trop, negative x2 -EKG without acute ischemic changes  -Echocardiogram with LVEF of 60-65% with G2DD 06/04/18 -Denies chest pain on day of discharge, no  recurrence since admission    2. Paroxsymal atrial fibrillation with pauses/bradycardia: -Pt presented to [facility] on 06/03/18 with c/o chest pain with pauses on telemetry>>EP consulted given symptoms>>Apaxiban held given anticipated procedure  -Initial EKG with atrial fibrillation/flutter with episodes of 5-7 second pauses.  -Eliquis was held in anticipation of PPM>>placed 06/04/18 secondary to above>>>will restart once cleared to restart from EP  -Echocardiogram with LVEF of 65-70% with G2DD -Maintaining SR>>>ok to resume Eliquis tomorrow, 06/06/18 per EP  -Chadsvasc=4  3. Depression: -Stable, continue fluoxetine   4. HTN: -Elevated, 141/76, 137/75, 185/102 (last evening) -Atenolol and losartan restarted  5. Chronic UTI: -Pt currently on Keflex without s/s of active infection>>she has completed antibiotic course and will need to refer to PCP for further management if needed -WBC WNL   Consultants: EP consult   Discharge Vitals Blood pressure (!) 141/76, pulse 66, temperature 98.2 F (36.8 C), temperature source Oral, resp. rate 19, height 5\' 8"  (1.727 m), weight 218 lb 0.6 oz (98.9 kg), SpO2 97 %.  Filed Weights   06/03/18 2000 06/03/18 2047 06/05/18 0344  Weight: 212 lb (96.2 kg) 217 lb 2.4 oz (98.5 kg) 218 lb 0.6 oz (98.9 kg)   Labs & Radiologic Studies    CBC Recent Labs    06/03/18 1818 06/04/18 0827 06/05/18 0207  WBC 5.7 5.7 5.6  NEUTROABS 3.9  --   --   HGB 12.2  12.6 12.9 12.5  HCT 37.9  37.0 39.0 39.2  MCV 95.2 94.0 95.1  PLT 191 186 256   Basic Metabolic Panel Recent Labs    06/03/18 1818 06/04/18 0827  NA 136  136 137  K 3.4*  3.3* 4.3  CL 101  99 102  CO2 26 27  GLUCOSE 138*  133* 101*  BUN 15  19 13   CREATININE 0.97  0.90 0.86  CALCIUM 8.9 8.9   Liver Function Tests Recent Labs    06/03/18 1818  AST 20  ALT 14  ALKPHOS 47  BILITOT 0.6  PROT 5.8*  ALBUMIN 3.5   Recent Labs    06/03/18 1818  LIPASE 52*   Cardiac  Enzymes Recent Labs    06/03/18 2123 06/04/18 0827  TROPONINI <0.03 <0.03    Thyroid Function Tests Recent Labs    06/03/18 1818  TSH 0.473   _____________  Dg Chest 2 View  Result Date: 06/05/2018 CLINICAL DATA:  Pacemaker insertion EXAM: CHEST - 2 VIEW COMPARISON:  06/03/2018 FINDINGS: Interval placement of left pacer with leads in the right atrium and right ventricle. No pneumothorax. There is hyperinflation of the lungs compatible with COPD. Mild cardiomegaly. No confluent airspace opacities or effusions. IMPRESSION: Left pacer placement without pneumothorax. COPD, cardiomegaly. No active disease. Electronically Signed   By: Rolm Baptise M.D.   On: 06/05/2018 07:36   Dg Chest Port 1 View  Result Date: 06/03/2018 CLINICAL DATA:  Chest heaviness beginning this afternoon which shortness-of-breath. EXAM: PORTABLE CHEST 1 VIEW COMPARISON:  None. FINDINGS: Lungs are adequately inflated without focal airspace consolidation or effusion. Cardiomediastinal silhouette and remainder of the exam is unchanged. IMPRESSION: No active disease. Electronically Signed   By: Marin Olp M.D.   On: 06/03/2018 18:48   Disposition   Pt is being discharged home today in  good condition.  Follow-up Plans & Appointments   Follow-up Information    [facility] Follow up on 06/18/2018.   Specialty:  Cardiology Why:  4:00PM, wound check visit Contact information: [address] [place]       Deboraha Sprang, MD Follow up on 09/11/2018.   Specialty:  Cardiology Why:  3:00PM Contact information: [address] [place] [address] [phone]        Fay Records, MD Follow up on 07/02/2018.   Specialty:  Cardiology Why:  Your appointment will be on 07/02/18 at 11am with Dr. Harrington Challenger. Please arrive by 1045am.  Contact information: [place] [address] [phone]           Discharge Instructions    Call MD for:  difficulty breathing, headache or visual disturbances   Complete by:  As directed    Call MD for:  persistant dizziness or light-headedness   Complete by:  As directed    Call MD for:  severe uncontrolled pain   Complete by:  As directed    Call MD for:  temperature >100.4   Complete by:  As directed    Diet - low sodium heart healthy   Complete by:  As directed    Discharge instructions   Complete by:  As directed    Please restart your Eliquis TOMORROW 06/06/18 and watch for surgical site bleeding. You have been set up with follow up appointments. Dates and times are attached.   Increase activity slowly   Complete by:  As directed      Discharge Medications   Allergies as of 06/05/2018      Reactions   Ciprofloxacin Other (See Comments)   Unknown   Latex Other (See Comments)   Unknown   Levofloxacin Other (See Comments)   Unknown   Macrobid [nitrofurantoin Monohyd Macro] Other (See Comments)   Unknown   Nitrofurantoin Other (See Comments)   Unknown   Sulfa Antibiotics Other (See Comments)   Unknown      Medication List    TAKE these medications   ALIGN PO Take 1 capsule by mouth daily.   ALPRAZolam 0.5 MG tablet Commonly known as:  XANAX Take 0.5 mg by mouth 3 (three) times daily as needed for anxiety.   apixaban 5 MG Tabs tablet Commonly known as:  ELIQUIS Take 1 tablet (5 mg total) by mouth 2 (two) times daily.   atenolol 25 MG tablet Commonly known as:  TENORMIN Take 25 mg by mouth daily.   Fish Oil 1200 MG Caps Take 4 capsules by mouth daily. What changed:  Another medication with the same name was removed. Continue taking this medication, and follow the directions you see here.   FLUoxetine 20 MG tablet Commonly known as:  PROZAC Take 20 mg by mouth daily.   levothyroxine 112 MCG tablet Commonly known as:  SYNTHROID, LEVOTHROID Take 112 mcg by mouth daily before breakfast.   losartan 100 MG  tablet Commonly known as:  COZAAR Take 100 mg by mouth daily.   MULTIVITAMIN PO Take 1 tablet by mouth daily.   SYSTANE 0.4-0.3 % Soln Generic drug:  Polyethyl Glycol-Propyl Glycol Place 1 drop into both eyes at bedtime.   traMADol 50 MG tablet Commonly known as:  ULTRAM Take 50 mg by mouth every 6 (six) hours as needed for moderate pain.   vitamin B-12 100 MCG tablet  Commonly known as:  CYANOCOBALAMIN Take 100 mcg by mouth daily.   vitamin C 500 MG tablet Commonly known as:  ASCORBIC ACID Take 500 mg by mouth daily.   Vitamin D3 5000 units Tabs Take 5,000 Units by mouth daily.        Acute coronary syndrome (MI, NSTEMI, STEMI, etc) this admission?: No.    Outstanding Labs/Studies   Wound care, site check appointment made as well as follow up with Harrington Challenger and Caryl Comes   Duration of Discharge Encounter   Greater than 30 minutes including physician time.  Signed, [place], NP 06/05/2018, 10:50 AM

## 2018-06-05 NOTE — Progress Notes (Addendum)
Progress Note  Patient Name: Faith Parker Date of Encounter: 06/05/2018  Primary Cardiologist: Dr. Harrington Challenger  Subjective   Minimal discomfort at pacer site, declines any need for pain management, no CP or SOB  Inpatient Medications    Scheduled Meds: . atenolol  25 mg Oral Daily  . cephALEXin  250 mg Oral Daily  . FLUoxetine  20 mg Oral Daily  . levothyroxine  112 mcg Oral QAC breakfast  . losartan  100 mg Oral Daily   Continuous Infusions: .  ceFAZolin (ANCEF) IV 1 g (06/05/18 0744)   PRN Meds: acetaminophen, ALPRAZolam, ondansetron (ZOFRAN) IV, traMADol   Vital Signs    Vitals:   06/04/18 2100 06/05/18 0344 06/05/18 0420 06/05/18 0553  BP:   (!) 185/102 137/75  Pulse: 63 61    Resp: 17 17  16   Temp:  97.7 F (36.5 C)    TempSrc:  Oral    SpO2: 96% 96%  96%  Weight:  218 lb 0.6 oz (98.9 kg)    Height:        Intake/Output Summary (Last 24 hours) at 06/05/2018 0753 Last data filed at 06/05/2018 0416 Gross per 24 hour  Intake 420 ml  Output 1150 ml  Net -730 ml   Filed Weights   06/03/18 2000 06/03/18 2047 06/05/18 0344  Weight: 212 lb (96.2 kg) 217 lb 2.4 oz (98.5 kg) 218 lb 0.6 oz (98.9 kg)    Telemetry    SR, intermittent APacing - Personally Reviewed  ECG    A paced/V sensed - Personally Reviewed  Physical Exam   GEN: No acute distress.   Neck: No JVD Cardiac: RRR, no murmurs, rubs, or gallops.  Respiratory: CTA b/l. GI: Soft, nontender, non-distended  MS: No edema; No deformity. Neuro:  Nonfocal  Psych: Normal affect   PPM implant site: stable, derma-bond in place, no bleeding or hematoma.  Labs    Chemistry Recent Labs  Lab 06/03/18 1818 06/04/18 0827  NA 136  136 137  K 3.4*  3.3* 4.3  CL 101  99 102  CO2 26 27  GLUCOSE 138*  133* 101*  BUN 15  19 13   CREATININE 0.97  0.90 0.86  CALCIUM 8.9 8.9  PROT 5.8*  --   ALBUMIN 3.5  --   AST 20  --   ALT 14  --   ALKPHOS 47  --   BILITOT 0.6  --   GFRNONAA 51* 59*  GFRAA  60* >60  ANIONGAP 9 8     Hematology Recent Labs  Lab 06/03/18 1818 06/04/18 0827 06/05/18 0207  WBC 5.7 5.7 5.6  RBC 3.98 4.15 4.12  HGB 12.2  12.6 12.9 12.5  HCT 37.9  37.0 39.0 39.2  MCV 95.2 94.0 95.1  MCH 30.7 31.1 30.3  MCHC 32.2 33.1 31.9  RDW 13.2 13.2 13.3  PLT 191 186 179    Cardiac Enzymes Recent Labs  Lab 06/03/18 2123 06/04/18 0827  TROPONINI <0.03 <0.03    Recent Labs  Lab 06/03/18 1817  TROPIPOC 0.00     BNPNo results for input(s): BNP, PROBNP in the last 168 hours.   DDimer No results for input(s): DDIMER in the last 168 hours.   Radiology    Dg Chest 2 View Result Date: 06/05/2018 CLINICAL DATA:  Pacemaker insertion EXAM: CHEST - 2 VIEW COMPARISON:  06/03/2018 FINDINGS: Interval placement of left pacer with leads in the right atrium and right ventricle. No pneumothorax. There is hyperinflation of  the lungs compatible with COPD. Mild cardiomegaly. No confluent airspace opacities or effusions. IMPRESSION: Left pacer placement without pneumothorax. COPD, cardiomegaly. No active disease. Electronically Signed   By: Rolm Baptise M.D.   On: 06/05/2018 07:36    Cardiac Studies   06/04/18 TTE Study Conclusions - Left ventricle: The cavity size was normal. Wall thickness was   normal. Systolic function was vigorous. The estimated ejection   fraction was in the range of 65% to 70%. Features are consistent   with a pseudonormal left ventricular filling pattern, with   concomitant abnormal relaxation and increased filling pressure   (grade 2 diastolic dysfunction). - Aortic valve: There was mild stenosis. Valve area (VTI): 2.02   cm^2. Valve area (Vmax): 1.76 cm^2. Valve area (Vmean): 1.81   cm^2. - Left atrium: The atrium was moderately dilated.   Patient Profile     84 y.o. female  with a hx of PAFib, HTN, VHD w/mild AS, Hypothyroidism admitted with c/o CP, observed to have long post termination pauses and is now s/p PPM  Assessment & Plan    1.  PAFib (rate controlled)  2. post termination pauses >> PPM     Pauses 2-7.6 seconds     CHA2DS2Vasc is 4, on Eliquis (held here for pacer implant)    Maintaining SR last 24 hours, OK to resume Eliquis tomorrow from a post implant standpoint PPM interrogation this morning with intact function CXR this AM with no ptx, mentions COPD (not appreciated on admission cxr), no pulm symptoms, never smoked, d/w patient f/u with PMD PPM site is stable, no hematoma Wound care and activity restrictions discussed with the patient Routine post implant follow up has been arranged    3. CP     Not ongoing     No clear ischemic looking changes in her EKGs     Trop negative     Further is deferred to primary cardiology team       4. HTN     high last night, better this AM     Atenolol resumed post pacer     C/w primary cardiology team       5. Chronic/recurrent UTI's     Well controlled per patient this past year now chronically on Keflex     Denies symptoms of current infection     WBC wnl     afebrile    EP service will sign off though remain available, please recall if needed   For questions or updates, please contact [place] Please consult [email or web address] for contact info under Cardiology/STEMI.      Signed, [place], PA-C  06/05/2018, 7:53 AM     Seen and questions answered instructions given  Resume apixoban next Monday

## 2018-06-14 ENCOUNTER — Encounter: Payer: Self-pay | Admitting: Internal Medicine

## 2018-06-18 ENCOUNTER — Ambulatory Visit (INDEPENDENT_AMBULATORY_CARE_PROVIDER_SITE_OTHER): Payer: Medicare Other | Admitting: *Deleted

## 2018-06-18 DIAGNOSIS — R001 Bradycardia, unspecified: Secondary | ICD-10-CM

## 2018-06-18 LAB — CUP PACEART INCLINIC DEVICE CHECK
Battery Voltage: 3.21 V
Brady Statistic AP VP Percent: 0.02 %
Brady Statistic AS VS Percent: 38.04 %
Brady Statistic RV Percent Paced: 0.07 %
Date Time Interrogation Session: 20190715165344
Implantable Lead Implant Date: 20190701
Implantable Lead Implant Date: 20190701
Implantable Lead Location: 753859
Implantable Lead Model: 5076
Implantable Pulse Generator Implant Date: 20190701
Lead Channel Impedance Value: 418 Ohm
Lead Channel Impedance Value: 494 Ohm
Lead Channel Impedance Value: 532 Ohm
Lead Channel Pacing Threshold Amplitude: 0.75 V
Lead Channel Pacing Threshold Amplitude: 1 V
Lead Channel Sensing Intrinsic Amplitude: 1.5 mV
Lead Channel Sensing Intrinsic Amplitude: 1.5 mV
Lead Channel Sensing Intrinsic Amplitude: 18.5 mV
Lead Channel Setting Pacing Amplitude: 3.5 V
Lead Channel Setting Pacing Amplitude: 3.5 V
Lead Channel Setting Sensing Sensitivity: 2 mV
MDC IDC LEAD LOCATION: 753860
MDC IDC MSMT BATTERY REMAINING LONGEVITY: 159 mo
MDC IDC MSMT LEADCHNL RA IMPEDANCE VALUE: 418 Ohm
MDC IDC MSMT LEADCHNL RA PACING THRESHOLD PULSEWIDTH: 0.4 ms
MDC IDC MSMT LEADCHNL RV PACING THRESHOLD PULSEWIDTH: 0.4 ms
MDC IDC MSMT LEADCHNL RV SENSING INTR AMPL: 19.625 mV
MDC IDC SET LEADCHNL RV PACING PULSEWIDTH: 0.4 ms
MDC IDC STAT BRADY AP VS PERCENT: 61.92 %
MDC IDC STAT BRADY AS VP PERCENT: 0.02 %
MDC IDC STAT BRADY RA PERCENT PACED: 61.72 %

## 2018-06-18 NOTE — Progress Notes (Signed)
Wound check appointment. Dermabond removed. Wound without redness or edema. Incision edges approximated, wound well healed. Normal device function. Thresholds, sensing, and impedances consistent with implant measurements. Device programmed at 3.5V on for extra safety margin until 3 month visit. Histogram distribution appropriate for patient and level of activity. 0.5% AT/AF + Eliquis. No high ventricular rates noted. Patient educated about wound care, arm mobility, lifting restrictions. ROV 09/11/2018 w/ SK.

## 2018-07-02 ENCOUNTER — Encounter: Payer: Self-pay | Admitting: Internal Medicine

## 2018-07-02 ENCOUNTER — Ambulatory Visit (INDEPENDENT_AMBULATORY_CARE_PROVIDER_SITE_OTHER): Payer: Medicare Other | Admitting: Internal Medicine

## 2018-07-02 VITALS — BP 138/86 | HR 67 | Ht 68.0 in | Wt 213.0 lb

## 2018-07-02 DIAGNOSIS — R001 Bradycardia, unspecified: Secondary | ICD-10-CM | POA: Diagnosis not present

## 2018-07-02 DIAGNOSIS — I712 Thoracic aortic aneurysm, without rupture, unspecified: Secondary | ICD-10-CM

## 2018-07-02 DIAGNOSIS — I480 Paroxysmal atrial fibrillation: Secondary | ICD-10-CM | POA: Diagnosis not present

## 2018-07-02 DIAGNOSIS — I060 Rheumatic aortic stenosis: Secondary | ICD-10-CM | POA: Diagnosis not present

## 2018-07-02 DIAGNOSIS — I10 Essential (primary) hypertension: Secondary | ICD-10-CM | POA: Diagnosis not present

## 2018-07-02 NOTE — Patient Instructions (Signed)
Your physician recommends that you continue on your current medications as directed. Please refer to the Current Medication list given to you today. Your physician wants you to follow-up in: 6 months with Dr. Ross.  You will receive a reminder letter in the mail two months in advance. If you don't receive a letter, please call our office to schedule the follow-up appointment.  

## 2018-07-02 NOTE — Progress Notes (Signed)
Cardiology Office Note   Date:  07/02/2018   ID:  Faith Parker, DOB May 01, 1930, MRN [id number]  PCP:  Leighton Ruff, MD  Cardiologist:   Dorris Carnes, MD   F/U of paroxysmal atrial fib and bradycardia      History of Present Illness: Faith Parker is a 84 y.o. female with a history of HTN, Aortic stenosis /insuff, hypothyroids.  Episode of chest tightness and nearsyncope  EMS tele strips with atrial fib.  MRI nega  Echo with mild AS, AI.  CT neg for PE  Aorta 4.1 cm    The pt was admitteed to [facility]  in early July with chest discomfort  Found to have signif pauses  (6 to 7 seconds)   Alos had PAF with RVR   Underwent placement of PPI      Since d/c she denies CP   Does hava lot of fatigue   Pt's daughter attrib to many activities that her mom has had this summer   Current Meds  Medication Sig  . ALPRAZolam (XANAX) 0.5 MG tablet Take 0.5 mg by mouth 3 (three) times daily as needed for anxiety.  Marland Kitchen apixaban (ELIQUIS) 5 MG TABS tablet Take 1 tablet (5 mg total) by mouth 2 (two) times daily.  Marland Kitchen atenolol (TENORMIN) 25 MG tablet Take 25 mg by mouth daily.   . cephALEXin (KEFLEX) 250 MG capsule Take 1 capsule by mouth daily.  . Cholecalciferol (VITAMIN D3) 5000 units TABS Take 5,000 Units by mouth daily.  Marland Kitchen FLUoxetine (PROZAC) 20 MG tablet Take 20 mg by mouth daily.  Marland Kitchen levothyroxine (SYNTHROID, LEVOTHROID) 112 MCG tablet Take 112 mcg by mouth daily before breakfast.  . losartan (COZAAR) 100 MG tablet Take 100 mg by mouth daily.   . Multiple Vitamins-Minerals (MULTIVITAMIN PO) Take 1 tablet by mouth daily.  . Omega-3 Fatty Acids (FISH OIL) 1200 MG CAPS Take 4 capsules by mouth daily.  Vladimir Faster Glycol-Propyl Glycol (SYSTANE) 0.4-0.3 % SOLN Place 1 drop into both eyes at bedtime.  . Probiotic Product (ALIGN PO) Take 1 capsule by mouth daily.   . traMADol (ULTRAM) 50 MG tablet Take 50 mg by mouth every 6 (six) hours as needed for moderate pain.   . vitamin B-12 (CYANOCOBALAMIN) 100  MCG tablet Take 100 mcg by mouth daily.  . vitamin C (ASCORBIC ACID) 500 MG tablet Take 500 mg by mouth daily.     Allergies:   Ciprofloxacin; Latex; Levofloxacin; Macrobid [nitrofurantoin monohyd macro]; Nitrofurantoin; and Sulfa antibiotics   Past Medical History:  Diagnosis Date  . Anxiety   . Aortic regurgitation   . Gastritis   . GERD (gastroesophageal reflux disease)   . H/O artificial eye lens   . Hypertension   . Hypothyroid   . Left ventricular hypertrophy   . Major depression   . Osteoarthritis   . Osteoporosis   . Peptic ulcer disease   . Stress incontinence     Past Surgical History:  Procedure Laterality Date  . LEG SURGERY Right    multiple surgeries, unknown type  . PACEMAKER IMPLANT N/A 06/04/2018   Procedure: PACEMAKER IMPLANT;  Surgeon: Deboraha Sprang, MD;  Location: [facility];  Service: Cardiovascular;  Laterality: N/A;     Social History:  The patient  reports that she has never smoked. She has never used smokeless tobacco. She reports that she does not drink alcohol or use drugs.   Family History:  The patient's family history is not  on file.    ROS:  Please see the history of present illness. All other systems are reviewed and  Negative to the above problem except as noted.    PHYSICAL EXAM: VS:  BP 138/86   Pulse 67   Ht 5\' 8"  (1.727 m)   Wt 213 lb (96.6 kg)   SpO2 95%   BMI 32.39 kg/m   GEN: Well nourished, well developed, in no acute distress  HEENT: normal  Neck: JVp is normal  , carotid bruits, or masses Chest  Pacer site clean  Dry  No edema   Cardiac: RRR; Gr II /vI sytolif murmur  No  rubs, or gallops,Tr edema  Respiratory:  clear to auscultation bilaterally, normal work of breathing GI: soft, nontender, nondistended, + BS  No hepatomegaly  MS: no deformity Moving all extremities   Skin: warm and dry, no rash Neuro:  Strength and sensation are intact Psych: euthymic mood, full affect   EKG:  EKG is not ordered  today.   Lipid Panel No results found for: CHOL, TRIG, HDL, CHOLHDL, VLDL, LDLCALC, LDLDIRECT    Wt Readings from Last 3 Encounters:  07/02/18 213 lb (96.6 kg)  06/05/18 218 lb 0.6 oz (98.9 kg)  01/19/18 212 lb (96.2 kg)      ASSESSMENT AND PLAN:  1  PAF Recent episodes in hospital   Pt asymtpomatic   Keep on current regimen incluiding Eliquis  2   Tachy brady   Pt is s/p PPM    Doing well   Device nurse came in to eval pt.   2  HTN  BP is fair  Keep on same meds     3 AV dz   Echo in July showed mild stenosis.     4  TAA   Aorta minimally dilated 58mm     5  Dizzinesss  BP is OK  Pt denies    6  Fatigue   I am not convinced of any problems  I told pt and family to watch her as she gets back into routine.     I encouraged her to remain active    F/u this winter     Current medicines are reviewed at length with the patient today.  The patient does not have concerns regarding medicines.  Signed, Dorris Carnes, MD  07/02/2018 11:34 AM    [address], [place], [place]  [address] Phone: [phone]; Fax: [phone]

## 2018-07-25 ENCOUNTER — Ambulatory Visit: Payer: Medicare Other | Admitting: Neurology

## 2018-08-03 DIAGNOSIS — E7800 Pure hypercholesterolemia, unspecified: Secondary | ICD-10-CM | POA: Diagnosis not present

## 2018-08-03 DIAGNOSIS — I712 Thoracic aortic aneurysm, without rupture: Secondary | ICD-10-CM | POA: Diagnosis not present

## 2018-08-16 ENCOUNTER — Telehealth: Payer: Self-pay | Admitting: Internal Medicine

## 2018-08-16 ENCOUNTER — Encounter: Payer: Self-pay | Admitting: *Deleted

## 2018-08-16 DIAGNOSIS — I700 Atherosclerosis of aorta: Secondary | ICD-10-CM

## 2018-08-16 NOTE — Telephone Encounter (Signed)
Spoke to patient with results/recommendations.  The patient will call her insurance company and evaluate Rosuvastatin vs Atorvastatin cost.  She will call us with results.

## 2018-08-16 NOTE — Telephone Encounter (Signed)
Left the pt a message to return a call back to the office and request to speak with a triage nurse.

## 2018-08-16 NOTE — Telephone Encounter (Signed)
Follow Up:    Pt retuning Michalene's call from earlier today.

## 2018-08-17 MED ORDER — ROSUVASTATIN CALCIUM 20 MG PO TABS: 20.0000 mg | ORAL_TABLET | Freq: Every day | ORAL | 6 refills | Status: DC

## 2018-08-17 NOTE — Telephone Encounter (Signed)
Notes recorded by Fay Records, MD on 08/09/2018 at 9:28 AM EDT Pt's LDL is elevated at 139  Goal below 90  Has atherosclerosis of aorta REcomm Crestor 20  If insurance prefers can use lipitor 40   F?U lipid panel and ASTin 8 wks. ______________________________  Faith Parker with patient. She will start rosuvastatin 20 mg daily. Will come for labs 10/19/18. Has concerns about side effects. Advised less likely than atorvastatin but if she gets muscle or joint pains to stop medicine and call. 30 day supply sent until patient determines if tolerating, then will increase to 90 day supply if requested.

## 2018-08-17 NOTE — Telephone Encounter (Signed)
Follow Up:    Patient returning [place] call.

## 2018-09-11 ENCOUNTER — Ambulatory Visit (INDEPENDENT_AMBULATORY_CARE_PROVIDER_SITE_OTHER): Payer: Medicare Other | Admitting: Internal Medicine

## 2018-09-11 ENCOUNTER — Encounter: Payer: Self-pay | Admitting: Internal Medicine

## 2018-09-11 VITALS — BP 122/86 | HR 71 | Ht 68.0 in | Wt 217.8 lb

## 2018-09-11 DIAGNOSIS — I10 Essential (primary) hypertension: Secondary | ICD-10-CM | POA: Diagnosis not present

## 2018-09-11 DIAGNOSIS — R001 Bradycardia, unspecified: Secondary | ICD-10-CM | POA: Diagnosis not present

## 2018-09-11 DIAGNOSIS — I060 Rheumatic aortic stenosis: Secondary | ICD-10-CM | POA: Diagnosis not present

## 2018-09-11 NOTE — Patient Instructions (Signed)
Medication Instructions:  Your physician recommends that you continue on your current medications as directed. Please refer to the Current Medication list given to you today. If you need a refill on your cardiac medications before your next appointment, please call your pharmacy.   Lab work: None ordered. If you have labs (blood work) drawn today and your tests are completely normal, you will receive your results only by: Marland Kitchen MyChart Message (if you have MyChart) OR . A paper copy in the mail If you have any lab test that is abnormal or we need to change your treatment, we will call you to review the results.  Testing/Procedures: None ordered.  Follow-Up: At [facility], you and your health needs are our priority.  As part of our continuing mission to provide you with exceptional heart care, we have created designated Provider Care Teams.  These Care Teams include your primary Cardiologist (physician) and Advanced Practice Providers (APPs -  Physician Assistants and Nurse Practitioners) who all work together to provide you with the care you need, when you need it. You will need a follow up appointment in 9 months.  Please call our office 2 months in advance to schedule this appointment.  You may see Dr Caryl Comes or one of the following Advanced Practice Providers on your designated Care Team:   Chanetta Marshall, NP . Tommye Standard, PA-C  Any Other Special Instructions Will Be Listed Below (If Applicable).

## 2018-09-11 NOTE — Progress Notes (Signed)
Patient Care Team: Leighton Ruff, MD as PCP - General (Family Medicine) Fay Records, MD as PCP - Cardiology (Cardiology)   HPI  Faith Parker is a 84 y.o. female Ivin Booty 52 Mom)  Seen in follow-up for pacemaker implanted 7/19 for atrial fibrillation with posttermination pauses of greater than 6 seconds.  Echo EF 7/19 65-70% with moderate LAD (16/1.0/96)  Thrombolic risk factors are notable for age-83 gender-1 hypertension-1 for CHADS-VASc score 4  She has complaints of fatigue but concurred with this was the death of her granddaughter Records and Results Reviewed   Past Medical History:  Diagnosis Date  . Anxiety   . Aortic regurgitation   . Gastritis   . GERD (gastroesophageal reflux disease)   . H/O artificial eye lens   . Hypertension   . Hypothyroid   . Left ventricular hypertrophy   . Major depression   . Osteoarthritis   . Osteoporosis   . Peptic ulcer disease   . Stress incontinence     Past Surgical History:  Procedure Laterality Date  . LEG SURGERY Right    multiple surgeries, unknown type  . PACEMAKER IMPLANT N/A 06/04/2018   Procedure: PACEMAKER IMPLANT;  Surgeon: Deboraha Sprang, MD;  Location: [facility];  Service: Cardiovascular;  Laterality: N/A;    Current Meds  Medication Sig  . ALPRAZolam (XANAX) 0.5 MG tablet Take 0.5 mg by mouth 3 (three) times daily as needed for anxiety.  Marland Kitchen apixaban (ELIQUIS) 5 MG TABS tablet Take 1 tablet (5 mg total) by mouth 2 (two) times daily.  Marland Kitchen atenolol (TENORMIN) 25 MG tablet Take 25 mg by mouth daily.   . cephALEXin (KEFLEX) 250 MG capsule Take 1 capsule by mouth daily.  . Cholecalciferol (VITAMIN D3) 5000 units TABS Take 5,000 Units by mouth daily.  Marland Kitchen FLUoxetine (PROZAC) 20 MG tablet Take 20 mg by mouth daily.  Marland Kitchen levothyroxine (SYNTHROID, LEVOTHROID) 112 MCG tablet Take 112 mcg by mouth daily before breakfast.  . losartan (COZAAR) 100 MG tablet Take 100 mg by mouth daily.   . Multiple  Vitamins-Minerals (MULTIVITAMIN PO) Take 1 tablet by mouth daily.  . Omega-3 Fatty Acids (FISH OIL) 1200 MG CAPS Take 4 capsules by mouth daily.  Vladimir Faster Glycol-Propyl Glycol (SYSTANE) 0.4-0.3 % SOLN Place 1 drop into both eyes at bedtime.  . Probiotic Product (ALIGN PO) Take 1 capsule by mouth daily.   . rosuvastatin (CRESTOR) 20 MG tablet Take 1 tablet (20 mg total) by mouth daily.  . traMADol (ULTRAM) 50 MG tablet Take 50 mg by mouth every 6 (six) hours as needed for moderate pain.   . vitamin B-12 (CYANOCOBALAMIN) 100 MCG tablet Take 100 mcg by mouth daily.  . vitamin C (ASCORBIC ACID) 500 MG tablet Take 500 mg by mouth daily.    Allergies  Allergen Reactions  . Ciprofloxacin Other (See Comments)    Unknown  . Latex Other (See Comments)    Unknown  . Levofloxacin Other (See Comments)    Unknown  . Macrobid [employer] Macro] Other (See Comments)    Unknown  . Nitrofurantoin Other (See Comments)    Unknown  . Sulfa Antibiotics Other (See Comments)    Unknown      Review of Systems negative except from HPI and PMH  Physical Exam BP 122/86   Pulse 71   Ht 5\' 8"  (1.727 m)   Wt 217 lb 12.8 oz (98.8 kg)   SpO2 95%  BMI 33.12 kg/m  Well developed and well nourished in no acute distress HENT normal E scleral and icterus clear Neck Supple JVP flat; carotids brisk and full Clear to ausculation Device pocket well healed; without hematoma or erythema.  There is no tethering  *Regular rate and rhythm, no murmurs gallops or rub Soft with active bowel sounds No clubbing cyanosis  Edema Alert and oriented, grossly normal motor and sensory function Skin Warm and Dry  Sinus rhythm at 71 Intervals 19/09/42  Assessment and  Plan  Atrial fibrillation-paroxysmal with posttermination pauses  Syncope associated with the above  Pacemaker-Medtronic  The patient's device was interrogated and the information was fully reviewed.  The device was reprogrammed to  maximize longevity   Some intercurrent atrial fibrillation or flutter  On Anticoagulation;  No bleeding issues        Current medicines are reviewed at length with the patient today .  The patient does not  have concerns regarding medicines.

## 2018-09-12 LAB — CUP PACEART INCLINIC DEVICE CHECK
Battery Remaining Longevity: 167 mo
Battery Voltage: 3.17 V
Brady Statistic AP VP Percent: 0.03 %
Brady Statistic RA Percent Paced: 58.43 %
Date Time Interrogation Session: 20191008194245
Implantable Lead Implant Date: 20190701
Implantable Lead Implant Date: 20190701
Implantable Lead Location: 753859
Implantable Lead Model: 5076
Implantable Pulse Generator Implant Date: 20190701
Lead Channel Impedance Value: 380 Ohm
Lead Channel Pacing Threshold Amplitude: 0.75 V
Lead Channel Pacing Threshold Pulse Width: 0.4 ms
Lead Channel Sensing Intrinsic Amplitude: 2.5 mV
Lead Channel Setting Pacing Amplitude: 2.5 V
Lead Channel Setting Sensing Sensitivity: 2 mV
MDC IDC LEAD LOCATION: 753860
MDC IDC MSMT LEADCHNL RA IMPEDANCE VALUE: 627 Ohm
MDC IDC MSMT LEADCHNL RA PACING THRESHOLD PULSEWIDTH: 0.4 ms
MDC IDC MSMT LEADCHNL RV IMPEDANCE VALUE: 418 Ohm
MDC IDC MSMT LEADCHNL RV IMPEDANCE VALUE: 513 Ohm
MDC IDC MSMT LEADCHNL RV PACING THRESHOLD AMPLITUDE: 1 V
MDC IDC MSMT LEADCHNL RV SENSING INTR AMPL: 20.75 mV
MDC IDC SET LEADCHNL RA PACING AMPLITUDE: 2 V
MDC IDC SET LEADCHNL RV PACING PULSEWIDTH: 0.4 ms
MDC IDC STAT BRADY AP VS PERCENT: 59.75 %
MDC IDC STAT BRADY AS VP PERCENT: 0.04 %
MDC IDC STAT BRADY AS VS PERCENT: 40.18 %
MDC IDC STAT BRADY RV PERCENT PACED: 0.48 %

## 2018-09-26 DIAGNOSIS — M150 Primary generalized (osteo)arthritis: Secondary | ICD-10-CM | POA: Diagnosis not present

## 2018-10-03 DIAGNOSIS — M810 Age-related osteoporosis without current pathological fracture: Secondary | ICD-10-CM | POA: Diagnosis not present

## 2018-10-17 DIAGNOSIS — E559 Vitamin D deficiency, unspecified: Secondary | ICD-10-CM | POA: Diagnosis not present

## 2018-10-17 DIAGNOSIS — M150 Primary generalized (osteo)arthritis: Secondary | ICD-10-CM | POA: Diagnosis not present

## 2018-10-17 DIAGNOSIS — M25561 Pain in right knee: Secondary | ICD-10-CM | POA: Diagnosis not present

## 2018-10-17 DIAGNOSIS — Z6832 Body mass index (BMI) 32.0-32.9, adult: Secondary | ICD-10-CM | POA: Diagnosis not present

## 2018-10-17 DIAGNOSIS — M5136 Other intervertebral disc degeneration, lumbar region: Secondary | ICD-10-CM | POA: Diagnosis not present

## 2018-10-17 DIAGNOSIS — M810 Age-related osteoporosis without current pathological fracture: Secondary | ICD-10-CM | POA: Diagnosis not present

## 2018-10-17 DIAGNOSIS — E669 Obesity, unspecified: Secondary | ICD-10-CM | POA: Diagnosis not present

## 2018-10-17 DIAGNOSIS — M25562 Pain in left knee: Secondary | ICD-10-CM | POA: Diagnosis not present

## 2018-10-19 ENCOUNTER — Other Ambulatory Visit: Payer: Medicare Other

## 2018-12-06 DIAGNOSIS — N390 Urinary tract infection, site not specified: Secondary | ICD-10-CM | POA: Diagnosis not present

## 2018-12-06 DIAGNOSIS — N3946 Mixed incontinence: Secondary | ICD-10-CM | POA: Diagnosis not present

## 2018-12-11 ENCOUNTER — Ambulatory Visit (INDEPENDENT_AMBULATORY_CARE_PROVIDER_SITE_OTHER): Payer: Medicare Other

## 2018-12-11 DIAGNOSIS — R001 Bradycardia, unspecified: Secondary | ICD-10-CM

## 2018-12-12 NOTE — Progress Notes (Signed)
Remote pacemaker transmission.   

## 2018-12-13 LAB — CUP PACEART REMOTE DEVICE CHECK
Battery Remaining Longevity: 169 mo
Battery Voltage: 3.15 V
Brady Statistic AP VP Percent: 0.05 %
Brady Statistic AS VS Percent: 56.86 %
Brady Statistic RA Percent Paced: 40.94 %
Brady Statistic RV Percent Paced: 0.83 %
Date Time Interrogation Session: 20200107053334
Implantable Lead Implant Date: 20190701
Implantable Lead Location: 753860
Lead Channel Impedance Value: 342 Ohm
Lead Channel Impedance Value: 418 Ohm
Lead Channel Impedance Value: 589 Ohm
Lead Channel Pacing Threshold Amplitude: 0.75 V
Lead Channel Pacing Threshold Pulse Width: 0.4 ms
Lead Channel Setting Pacing Amplitude: 1.5 V
Lead Channel Setting Sensing Sensitivity: 2 mV
MDC IDC LEAD IMPLANT DT: 20190701
MDC IDC LEAD LOCATION: 753859
MDC IDC MSMT LEADCHNL RA IMPEDANCE VALUE: 380 Ohm
MDC IDC MSMT LEADCHNL RA SENSING INTR AMPL: 2.625 mV
MDC IDC MSMT LEADCHNL RA SENSING INTR AMPL: 2.625 mV
MDC IDC MSMT LEADCHNL RV PACING THRESHOLD AMPLITUDE: 1.25 V
MDC IDC MSMT LEADCHNL RV PACING THRESHOLD PULSEWIDTH: 0.4 ms
MDC IDC MSMT LEADCHNL RV SENSING INTR AMPL: 13.375 mV
MDC IDC MSMT LEADCHNL RV SENSING INTR AMPL: 13.375 mV
MDC IDC PG IMPLANT DT: 20190701
MDC IDC SET LEADCHNL RV PACING AMPLITUDE: 2.5 V
MDC IDC SET LEADCHNL RV PACING PULSEWIDTH: 0.4 ms
MDC IDC STAT BRADY AP VS PERCENT: 43.06 %
MDC IDC STAT BRADY AS VP PERCENT: 0.03 %

## 2019-02-21 ENCOUNTER — Other Ambulatory Visit: Payer: Self-pay | Admitting: Internal Medicine

## 2019-02-21 NOTE — Telephone Encounter (Signed)
Last OV 09/11/18 Scr 0.86 (06/04/18) 28.95kg 85 years old eliquis 5mg  bid sent to pharmacy

## 2019-02-21 NOTE — Telephone Encounter (Signed)
Please review for refill, Thanks !  

## 2019-03-12 ENCOUNTER — Ambulatory Visit (INDEPENDENT_AMBULATORY_CARE_PROVIDER_SITE_OTHER): Payer: Medicare Other | Admitting: *Deleted

## 2019-03-12 ENCOUNTER — Other Ambulatory Visit: Payer: Self-pay

## 2019-03-12 DIAGNOSIS — I495 Sick sinus syndrome: Secondary | ICD-10-CM

## 2019-03-12 DIAGNOSIS — R001 Bradycardia, unspecified: Secondary | ICD-10-CM | POA: Diagnosis not present

## 2019-03-13 LAB — CUP PACEART REMOTE DEVICE CHECK
Battery Remaining Longevity: 165 mo
Battery Voltage: 3.11 V
Brady Statistic AP VP Percent: 0.06 %
Brady Statistic AP VS Percent: 39.22 %
Brady Statistic AS VP Percent: 0.03 %
Brady Statistic AS VS Percent: 60.69 %
Brady Statistic RA Percent Paced: 36.71 %
Brady Statistic RV Percent Paced: 1.45 %
Date Time Interrogation Session: 20200407112408
Implantable Lead Implant Date: 20190701
Implantable Lead Implant Date: 20190701
Implantable Lead Location: 753859
Implantable Lead Location: 753860
Implantable Lead Model: 5076
Implantable Lead Model: 5076
Implantable Pulse Generator Implant Date: 20190701
Lead Channel Impedance Value: 323 Ohm
Lead Channel Impedance Value: 361 Ohm
Lead Channel Impedance Value: 418 Ohm
Lead Channel Impedance Value: 532 Ohm
Lead Channel Pacing Threshold Amplitude: 0.625 V
Lead Channel Pacing Threshold Amplitude: 1.375 V
Lead Channel Pacing Threshold Pulse Width: 0.4 ms
Lead Channel Pacing Threshold Pulse Width: 0.4 ms
Lead Channel Sensing Intrinsic Amplitude: 1.375 mV
Lead Channel Sensing Intrinsic Amplitude: 1.375 mV
Lead Channel Sensing Intrinsic Amplitude: 14.75 mV
Lead Channel Sensing Intrinsic Amplitude: 14.75 mV
Lead Channel Setting Pacing Amplitude: 1.5 V
Lead Channel Setting Pacing Amplitude: 3 V
Lead Channel Setting Pacing Pulse Width: 0.4 ms
Lead Channel Setting Sensing Sensitivity: 2 mV

## 2019-03-20 NOTE — Progress Notes (Signed)
Remote pacemaker transmission.   

## 2019-03-25 ENCOUNTER — Telehealth: Payer: Self-pay | Admitting: Internal Medicine

## 2019-03-25 NOTE — Telephone Encounter (Signed)
Per a call to the after hours line:  on 03/24/19 they wanted to report pt's BP high 200/100 gave her xanax and it went down to 160/90 the facility wanted to give an [place]

## 2019-03-25 NOTE — Telephone Encounter (Signed)
Yesterday 4 am got out of bed and was very dizzy, felt like might pass out.  Pressed button in her room that rang for nurse (lives at [facility])  BP was 200/100.   No way to check BP herself, no staff came today to check it today.   Came down to 160/90, HR 72, per prev message -this was after xanax administered. Tomorrow she will go downstairs to the nurse for BP check.  She has felt much better today.    She will call or ask the nurse to call office tomorrow after BP is checked.

## 2019-03-26 NOTE — Telephone Encounter (Signed)
Follow up:   Patient retunring call back from yesterday. Please call patient.

## 2019-03-26 NOTE — Telephone Encounter (Signed)
Called patient who reports BP today is 124/76 mmHg, pulse 70 bpm. She states she feels well. I advised her that I will forward this information to Dr. Harrington Challenger and her nurse and to call back if BP is elevated again or with questions or concerns. She verbalized agreement and thanked me for the call.

## 2019-04-15 ENCOUNTER — Other Ambulatory Visit: Payer: Self-pay | Admitting: Internal Medicine

## 2019-04-17 DIAGNOSIS — M25561 Pain in right knee: Secondary | ICD-10-CM | POA: Diagnosis not present

## 2019-04-17 DIAGNOSIS — M810 Age-related osteoporosis without current pathological fracture: Secondary | ICD-10-CM | POA: Diagnosis not present

## 2019-04-17 DIAGNOSIS — M25562 Pain in left knee: Secondary | ICD-10-CM | POA: Diagnosis not present

## 2019-04-17 DIAGNOSIS — M150 Primary generalized (osteo)arthritis: Secondary | ICD-10-CM | POA: Diagnosis not present

## 2019-04-17 DIAGNOSIS — M5136 Other intervertebral disc degeneration, lumbar region: Secondary | ICD-10-CM | POA: Diagnosis not present

## 2019-04-17 DIAGNOSIS — E559 Vitamin D deficiency, unspecified: Secondary | ICD-10-CM | POA: Diagnosis not present

## 2019-05-14 DIAGNOSIS — Z0000 Encounter for general adult medical examination without abnormal findings: Secondary | ICD-10-CM | POA: Diagnosis not present

## 2019-05-14 DIAGNOSIS — I351 Nonrheumatic aortic (valve) insufficiency: Secondary | ICD-10-CM | POA: Diagnosis not present

## 2019-05-14 DIAGNOSIS — E039 Hypothyroidism, unspecified: Secondary | ICD-10-CM | POA: Diagnosis not present

## 2019-05-14 DIAGNOSIS — I350 Nonrheumatic aortic (valve) stenosis: Secondary | ICD-10-CM | POA: Diagnosis not present

## 2019-05-14 DIAGNOSIS — E7800 Pure hypercholesterolemia, unspecified: Secondary | ICD-10-CM | POA: Diagnosis not present

## 2019-05-14 DIAGNOSIS — Z1389 Encounter for screening for other disorder: Secondary | ICD-10-CM | POA: Diagnosis not present

## 2019-05-14 DIAGNOSIS — F419 Anxiety disorder, unspecified: Secondary | ICD-10-CM | POA: Diagnosis not present

## 2019-05-14 DIAGNOSIS — F339 Major depressive disorder, recurrent, unspecified: Secondary | ICD-10-CM | POA: Diagnosis not present

## 2019-05-14 DIAGNOSIS — I10 Essential (primary) hypertension: Secondary | ICD-10-CM | POA: Diagnosis not present

## 2019-05-14 DIAGNOSIS — I480 Paroxysmal atrial fibrillation: Secondary | ICD-10-CM | POA: Diagnosis not present

## 2019-05-14 DIAGNOSIS — K219 Gastro-esophageal reflux disease without esophagitis: Secondary | ICD-10-CM | POA: Diagnosis not present

## 2019-05-14 DIAGNOSIS — E559 Vitamin D deficiency, unspecified: Secondary | ICD-10-CM | POA: Diagnosis not present

## 2019-05-17 ENCOUNTER — Other Ambulatory Visit: Payer: Self-pay | Admitting: Family Medicine

## 2019-05-17 DIAGNOSIS — M810 Age-related osteoporosis without current pathological fracture: Secondary | ICD-10-CM

## 2019-05-17 DIAGNOSIS — Z1231 Encounter for screening mammogram for malignant neoplasm of breast: Secondary | ICD-10-CM

## 2019-06-11 ENCOUNTER — Ambulatory Visit (INDEPENDENT_AMBULATORY_CARE_PROVIDER_SITE_OTHER): Payer: Medicare Other | Admitting: *Deleted

## 2019-06-11 DIAGNOSIS — I495 Sick sinus syndrome: Secondary | ICD-10-CM | POA: Diagnosis not present

## 2019-06-11 LAB — CUP PACEART REMOTE DEVICE CHECK
Battery Remaining Longevity: 163 mo
Battery Voltage: 3.07 V
Brady Statistic AP VP Percent: 0.07 %
Brady Statistic AP VS Percent: 48.53 %
Brady Statistic AS VP Percent: 0.05 %
Brady Statistic AS VS Percent: 51.35 %
Brady Statistic RA Percent Paced: 46.75 %
Brady Statistic RV Percent Paced: 0.92 %
Date Time Interrogation Session: 20200707061757
Implantable Lead Implant Date: 20190701
Implantable Lead Implant Date: 20190701
Implantable Lead Location: 753859
Implantable Lead Location: 753860
Implantable Lead Model: 5076
Implantable Lead Model: 5076
Implantable Pulse Generator Implant Date: 20190701
Lead Channel Impedance Value: 323 Ohm
Lead Channel Impedance Value: 361 Ohm
Lead Channel Impedance Value: 399 Ohm
Lead Channel Impedance Value: 570 Ohm
Lead Channel Pacing Threshold Amplitude: 0.75 V
Lead Channel Pacing Threshold Amplitude: 1.375 V
Lead Channel Pacing Threshold Pulse Width: 0.4 ms
Lead Channel Pacing Threshold Pulse Width: 0.4 ms
Lead Channel Sensing Intrinsic Amplitude: 11.875 mV
Lead Channel Sensing Intrinsic Amplitude: 11.875 mV
Lead Channel Sensing Intrinsic Amplitude: 2.375 mV
Lead Channel Sensing Intrinsic Amplitude: 2.375 mV
Lead Channel Setting Pacing Amplitude: 1.5 V
Lead Channel Setting Pacing Amplitude: 2.75 V
Lead Channel Setting Pacing Pulse Width: 0.4 ms
Lead Channel Setting Sensing Sensitivity: 2 mV

## 2019-06-20 ENCOUNTER — Other Ambulatory Visit: Payer: Self-pay | Admitting: Family Medicine

## 2019-06-20 DIAGNOSIS — I712 Thoracic aortic aneurysm, without rupture, unspecified: Secondary | ICD-10-CM

## 2019-06-23 ENCOUNTER — Encounter: Payer: Self-pay | Admitting: Cardiology

## 2019-06-23 NOTE — Progress Notes (Signed)
Remote pacemaker transmission.   

## 2019-07-26 ENCOUNTER — Other Ambulatory Visit: Payer: Medicare Other

## 2019-08-04 ENCOUNTER — Other Ambulatory Visit: Payer: Self-pay | Admitting: Internal Medicine

## 2019-09-03 ENCOUNTER — Other Ambulatory Visit: Payer: Self-pay | Admitting: Internal Medicine

## 2019-09-10 ENCOUNTER — Ambulatory Visit (INDEPENDENT_AMBULATORY_CARE_PROVIDER_SITE_OTHER): Payer: Medicare Other | Admitting: *Deleted

## 2019-09-10 DIAGNOSIS — R55 Syncope and collapse: Secondary | ICD-10-CM

## 2019-09-10 DIAGNOSIS — I480 Paroxysmal atrial fibrillation: Secondary | ICD-10-CM

## 2019-09-11 LAB — CUP PACEART REMOTE DEVICE CHECK
Battery Remaining Longevity: 158 mo
Battery Voltage: 3.05 V
Brady Statistic AP VP Percent: 0.13 %
Brady Statistic AP VS Percent: 52.53 %
Brady Statistic AS VP Percent: 0.04 %
Brady Statistic AS VS Percent: 47.3 %
Brady Statistic RA Percent Paced: 50.08 %
Brady Statistic RV Percent Paced: 1.1 %
Date Time Interrogation Session: 20201006055007
Implantable Lead Implant Date: 20190701
Implantable Lead Implant Date: 20190701
Implantable Lead Location: 753859
Implantable Lead Location: 753860
Implantable Lead Model: 5076
Implantable Lead Model: 5076
Implantable Pulse Generator Implant Date: 20190701
Lead Channel Impedance Value: 323 Ohm
Lead Channel Impedance Value: 361 Ohm
Lead Channel Impedance Value: 399 Ohm
Lead Channel Impedance Value: 456 Ohm
Lead Channel Pacing Threshold Amplitude: 0.5 V
Lead Channel Pacing Threshold Amplitude: 1.25 V
Lead Channel Pacing Threshold Pulse Width: 0.4 ms
Lead Channel Pacing Threshold Pulse Width: 0.4 ms
Lead Channel Sensing Intrinsic Amplitude: 1.5 mV
Lead Channel Sensing Intrinsic Amplitude: 1.5 mV
Lead Channel Sensing Intrinsic Amplitude: 12.625 mV
Lead Channel Sensing Intrinsic Amplitude: 12.625 mV
Lead Channel Setting Pacing Amplitude: 1.5 V
Lead Channel Setting Pacing Amplitude: 2.75 V
Lead Channel Setting Pacing Pulse Width: 0.4 ms
Lead Channel Setting Sensing Sensitivity: 2 mV

## 2019-09-20 NOTE — Progress Notes (Signed)
Remote pacemaker transmission.   

## 2019-10-03 DIAGNOSIS — M810 Age-related osteoporosis without current pathological fracture: Secondary | ICD-10-CM | POA: Diagnosis not present

## 2019-10-04 ENCOUNTER — Other Ambulatory Visit: Payer: Self-pay | Admitting: Internal Medicine

## 2019-10-04 NOTE — Telephone Encounter (Addendum)
Eliquis 5mg  refill request received. Pt is 85 yrs old, weight-98.8kg, Crea- 1.03 on 05/14/2019 via scanned labs from [facility] PCP, Diagnosis-Afib, and last seen by Dr. Caryl Comes on 09/21/2018, therefore, noted on the refill that need to call to schedule an appt with Cardiologist (this was place on the instructions to the pt and to pharmacy). Dose is appropriate based on dosing criteria. Will send in refill to requested pharmacy.   At 1131am received a voicemail from the pt to call back. Pt is aware she needs to schedule an appt with Cardiologist since she was due 9 months after being seen by Dr. Caryl Comes in October 2019 and a refill was sent for 30 day supply earlier today. Pt was transferred to the scheduler line.

## 2019-10-07 DIAGNOSIS — M810 Age-related osteoporosis without current pathological fracture: Secondary | ICD-10-CM | POA: Diagnosis not present

## 2019-10-08 ENCOUNTER — Other Ambulatory Visit: Payer: Self-pay | Admitting: Internal Medicine

## 2019-10-10 ENCOUNTER — Other Ambulatory Visit: Payer: Self-pay

## 2019-10-16 ENCOUNTER — Other Ambulatory Visit: Payer: Self-pay

## 2019-10-16 ENCOUNTER — Inpatient Hospital Stay (HOSPITAL_COMMUNITY)
Admission: EM | Admit: 2019-10-16 | Discharge: 2019-10-22 | DRG: 287 | Disposition: A | Discharge status: Home / Self Care | Payer: Medicare Other | Source: Ambulatory Visit | Attending: Internal Medicine | Admitting: Internal Medicine

## 2019-10-16 ENCOUNTER — Emergency Department (HOSPITAL_COMMUNITY): Payer: Medicare Other

## 2019-10-16 ENCOUNTER — Encounter (HOSPITAL_COMMUNITY): Payer: Self-pay | Admitting: *Deleted

## 2019-10-16 DIAGNOSIS — I5032 Chronic diastolic (congestive) heart failure: Secondary | ICD-10-CM | POA: Diagnosis present

## 2019-10-16 DIAGNOSIS — E039 Hypothyroidism, unspecified: Secondary | ICD-10-CM | POA: Diagnosis present

## 2019-10-16 DIAGNOSIS — I10 Essential (primary) hypertension: Secondary | ICD-10-CM | POA: Diagnosis not present

## 2019-10-16 DIAGNOSIS — R911 Solitary pulmonary nodule: Secondary | ICD-10-CM | POA: Diagnosis present

## 2019-10-16 DIAGNOSIS — I4891 Unspecified atrial fibrillation: Secondary | ICD-10-CM | POA: Diagnosis not present

## 2019-10-16 DIAGNOSIS — Z7901 Long term (current) use of anticoagulants: Secondary | ICD-10-CM

## 2019-10-16 DIAGNOSIS — M810 Age-related osteoporosis without current pathological fracture: Secondary | ICD-10-CM | POA: Diagnosis present

## 2019-10-16 DIAGNOSIS — R52 Pain, unspecified: Secondary | ICD-10-CM | POA: Diagnosis not present

## 2019-10-16 DIAGNOSIS — Z950 Presence of cardiac pacemaker: Secondary | ICD-10-CM

## 2019-10-16 DIAGNOSIS — I712 Thoracic aortic aneurysm, without rupture, unspecified: Secondary | ICD-10-CM | POA: Diagnosis present

## 2019-10-16 DIAGNOSIS — Z66 Do not resuscitate: Secondary | ICD-10-CM | POA: Diagnosis present

## 2019-10-16 DIAGNOSIS — F329 Major depressive disorder, single episode, unspecified: Secondary | ICD-10-CM | POA: Diagnosis present

## 2019-10-16 DIAGNOSIS — M546 Pain in thoracic spine: Secondary | ICD-10-CM | POA: Diagnosis not present

## 2019-10-16 DIAGNOSIS — R531 Weakness: Secondary | ICD-10-CM

## 2019-10-16 DIAGNOSIS — Z20828 Contact with and (suspected) exposure to other viral communicable diseases: Secondary | ICD-10-CM | POA: Diagnosis present

## 2019-10-16 DIAGNOSIS — Z91040 Latex allergy status: Secondary | ICD-10-CM

## 2019-10-16 DIAGNOSIS — N179 Acute kidney failure, unspecified: Secondary | ICD-10-CM | POA: Diagnosis present

## 2019-10-16 DIAGNOSIS — Z87440 Personal history of urinary (tract) infections: Secondary | ICD-10-CM

## 2019-10-16 DIAGNOSIS — R931 Abnormal findings on diagnostic imaging of heart and coronary circulation: Secondary | ICD-10-CM

## 2019-10-16 DIAGNOSIS — N390 Urinary tract infection, site not specified: Secondary | ICD-10-CM | POA: Diagnosis not present

## 2019-10-16 DIAGNOSIS — Z79899 Other long term (current) drug therapy: Secondary | ICD-10-CM

## 2019-10-16 DIAGNOSIS — N3000 Acute cystitis without hematuria: Secondary | ICD-10-CM

## 2019-10-16 DIAGNOSIS — I480 Paroxysmal atrial fibrillation: Secondary | ICD-10-CM | POA: Diagnosis present

## 2019-10-16 DIAGNOSIS — Z888 Allergy status to other drugs, medicaments and biological substances status: Secondary | ICD-10-CM

## 2019-10-16 DIAGNOSIS — K219 Gastro-esophageal reflux disease without esophagitis: Secondary | ICD-10-CM | POA: Diagnosis present

## 2019-10-16 DIAGNOSIS — Z8711 Personal history of peptic ulcer disease: Secondary | ICD-10-CM

## 2019-10-16 DIAGNOSIS — M5489 Other dorsalgia: Secondary | ICD-10-CM | POA: Diagnosis not present

## 2019-10-16 DIAGNOSIS — R0602 Shortness of breath: Secondary | ICD-10-CM | POA: Diagnosis not present

## 2019-10-16 DIAGNOSIS — Z882 Allergy status to sulfonamides status: Secondary | ICD-10-CM

## 2019-10-16 DIAGNOSIS — M545 Low back pain: Secondary | ICD-10-CM | POA: Diagnosis not present

## 2019-10-16 DIAGNOSIS — M1990 Unspecified osteoarthritis, unspecified site: Secondary | ICD-10-CM | POA: Diagnosis present

## 2019-10-16 DIAGNOSIS — I060 Rheumatic aortic stenosis: Secondary | ICD-10-CM | POA: Diagnosis not present

## 2019-10-16 DIAGNOSIS — R0609 Other forms of dyspnea: Secondary | ICD-10-CM | POA: Diagnosis present

## 2019-10-16 DIAGNOSIS — I352 Nonrheumatic aortic (valve) stenosis with insufficiency: Secondary | ICD-10-CM | POA: Diagnosis present

## 2019-10-16 DIAGNOSIS — F419 Anxiety disorder, unspecified: Secondary | ICD-10-CM | POA: Diagnosis present

## 2019-10-16 DIAGNOSIS — Z881 Allergy status to other antibiotic agents status: Secondary | ICD-10-CM

## 2019-10-16 DIAGNOSIS — E876 Hypokalemia: Secondary | ICD-10-CM | POA: Diagnosis present

## 2019-10-16 DIAGNOSIS — I160 Hypertensive urgency: Secondary | ICD-10-CM | POA: Diagnosis not present

## 2019-10-16 DIAGNOSIS — Z79890 Hormone replacement therapy (postmenopausal): Secondary | ICD-10-CM

## 2019-10-16 DIAGNOSIS — I110 Hypertensive heart disease with heart failure: Secondary | ICD-10-CM | POA: Diagnosis present

## 2019-10-16 HISTORY — DX: Unspecified atrial fibrillation: I48.91

## 2019-10-16 LAB — BASIC METABOLIC PANEL
Anion gap: 13 (ref 5–15)
BUN: 16 mg/dL (ref 8–23)
CO2: 21 mmol/L — ABNORMAL LOW (ref 22–32)
Calcium: 9.2 mg/dL (ref 8.9–10.3)
Chloride: 105 mmol/L (ref 98–111)
Creatinine, Ser: 1.49 mg/dL — ABNORMAL HIGH (ref 0.44–1.00)
GFR calc Af Amer: 36 mL/min — ABNORMAL LOW (ref >60)
GFR calc non Af Amer: 31 mL/min — ABNORMAL LOW (ref >60)
Glucose, Bld: 108 mg/dL — ABNORMAL HIGH (ref 70–99)
Potassium: 3.2 mmol/L — ABNORMAL LOW (ref 3.5–5.1)
Sodium: 139 mmol/L (ref 135–145)

## 2019-10-16 LAB — URINALYSIS, ROUTINE W REFLEX MICROSCOPIC
Bilirubin Urine: NEGATIVE
Glucose, UA: 50 mg/dL — AB
Ketones, ur: 5 mg/dL — AB
Leukocytes,Ua: NEGATIVE
Nitrite: NEGATIVE
Protein, ur: >=300 mg/dL — AB
Specific Gravity, Urine: 1.014 (ref 1.005–1.030)
pH: 6 (ref 5.0–8.0)

## 2019-10-16 LAB — CBC
HCT: 40.5 % (ref 36.0–46.0)
Hemoglobin: 13.1 g/dL (ref 12.0–15.0)
MCH: 30.5 pg (ref 26.0–34.0)
MCHC: 32.3 g/dL (ref 30.0–36.0)
MCV: 94.4 fL (ref 80.0–100.0)
Platelets: 210 10*3/uL (ref 150–400)
RBC: 4.29 MIL/uL (ref 3.87–5.11)
RDW: 13.6 % (ref 11.5–15.5)
WBC: 6.8 10*3/uL (ref 4.0–10.5)
nRBC: 0 % (ref 0.0–0.2)

## 2019-10-16 LAB — TROPONIN I (HIGH SENSITIVITY): Troponin I (High Sensitivity): 9 ng/L (ref <18)

## 2019-10-16 MED ORDER — SODIUM CHLORIDE 0.9% FLUSH: 3.0000 mL | Freq: Once | INTRAVENOUS | Status: DC

## 2019-10-16 MED ORDER — SODIUM CHLORIDE 0.9 % IV SOLN
1.0000 g | Freq: Once | INTRAVENOUS | Status: AC
  Administered 2019-10-17: 1 g via INTRAVENOUS
  Filled 2019-10-16: qty 10

## 2019-10-16 MED ORDER — IOHEXOL 350 MG/ML SOLN
75.0000 mL | Freq: Once | INTRAVENOUS | Status: AC | PRN
  Administered 2019-10-16: 23:00:00 75 mL via INTRAVENOUS

## 2019-10-16 NOTE — ED Notes (Signed)
Cardiologist called this RN and wants a UA for patient. Thinks her sx may be genitourinary in nature. Order placed.

## 2019-10-16 NOTE — ED Provider Notes (Signed)
[facility] EMERGENCY DEPARTMENT Provider Note   CSN: [id number] Arrival date & time: 10/16/19  1601     History   Chief Complaint Chief Complaint  Patient presents with  . Shortness of Breath    HPI Faith Parker is a 85 y.o. female.     Patient with a history of PAF, pacemaker, chronic anticoagulation, chronic UTI, HTN, OA, thoracic aneurysm (4.1 on 02/22/18)  to ED with c/o DOE and generalized weakness for the past 2-3 days. She also complains of right mid to lower back pain. No cough, congestion, sore throat. No chest pain. She was seen at [facility] today and directed here for witnessed DOE and diaphoresis in the office. No vomiting, diarrhea. The patient states symptoms are progressive and worsening over the last 1-2 days.    The history is provided by the patient. No language interpreter was used.  Shortness of Breath Associated symptoms: abdominal pain and diaphoresis   Associated symptoms: no chest pain, no cough, no fever and no vomiting     Past Medical History:  Diagnosis Date  . Anxiety   . Aortic regurgitation   . Gastritis   . GERD (gastroesophageal reflux disease)   . H/O artificial eye lens   . Hypertension   . Hypothyroid   . Left ventricular hypertrophy   . Major depression   . Osteoarthritis   . Osteoporosis   . Peptic ulcer disease   . Stress incontinence     Patient Active Problem List   Diagnosis Date Noted  . PAF (paroxysmal atrial fibrillation) ([place]) 06/05/2018  . Chronic anticoagulation 06/05/2018  . Chronic UTI 06/05/2018  . Cardiac device in situ   . Chest pain of uncertain etiology   . Bradycardia 06/03/2018  . Thoracic aortic aneurysm [facility]): a. 4.1 cm ascending thoracic aortic aneurysm 01/23/17 01/23/2017  . Syncope 01/21/2017  . Hypertension 01/21/2017  . Hypothyroidism 01/21/2017    Past Surgical History:  Procedure Laterality Date  . LEG SURGERY Right    multiple surgeries, unknown type  .  PACEMAKER IMPLANT N/A 06/04/2018   Procedure: PACEMAKER IMPLANT;  Surgeon: Deboraha Sprang, MD;  Location: [facility];  Service: Cardiovascular;  Laterality: N/A;     OB History   No obstetric history on file.      Home Medications    Prior to Admission medications   Medication Sig Start Date End Date Taking? Authorizing Provider  ALPRAZolam Duanne Moron) 0.5 MG tablet Take 0.5 mg by mouth 3 (three) times daily as needed for anxiety.    [provider]  apixaban (ELIQUIS) 5 MG TABS tablet Take 1 tablet (5 mg total) by mouth 2 (two) times daily. Please call Cardiology to schedule follow-up appt. 10/04/19   Deboraha Sprang, MD  atenolol (TENORMIN) 25 MG tablet Take 25 mg by mouth daily.     [provider]  cephALEXin (KEFLEX) 250 MG capsule Take 1 capsule by mouth daily. 06/05/18   [provider]  Cholecalciferol (VITAMIN D3) 5000 units TABS Take 5,000 Units by mouth daily.    [provider]  FLUoxetine (PROZAC) 20 MG tablet Take 20 mg by mouth daily.    [provider]  levothyroxine (SYNTHROID, LEVOTHROID) 112 MCG tablet Take 112 mcg by mouth daily before breakfast.    [provider]  losartan (COZAAR) 100 MG tablet Take 100 mg by mouth daily.     [provider]  Multiple Vitamins-Minerals (MULTIVITAMIN PO) Take 1 tablet by  mouth daily.    [provider]  Omega-3 Fatty Acids (FISH OIL) 1200 MG CAPS Take 4 capsules by mouth daily.    [provider]  Polyethyl Glycol-Propyl Glycol (SYSTANE) 0.4-0.3 % SOLN Place 1 drop into both eyes at bedtime.    [provider]  Probiotic Product (ALIGN PO) Take 1 capsule by mouth daily.     [provider]  rosuvastatin (CRESTOR) 20 MG tablet TAKE 1 TABLET BY MOUTH DAILY. 10/09/19   Fay Records, MD  traMADol (ULTRAM) 50 MG tablet Take 50 mg by mouth every 6 (six) hours as needed for moderate pain.     [provider]  vitamin B-12  (CYANOCOBALAMIN) 100 MCG tablet Take 100 mcg by mouth daily.    [provider]  vitamin C (ASCORBIC ACID) 500 MG tablet Take 500 mg by mouth daily.    [provider]    Family History Family History  Problem Relation Age of Onset  . Coronary artery disease Neg Hx     Social History Social History   Tobacco Use  . Smoking status: Never Smoker  . Smokeless tobacco: Never Used  Substance Use Topics  . Alcohol use: No  . Drug use: No     Allergies   Ciprofloxacin, Latex, Levofloxacin, Macrobid [nitrofurantoin monohyd macro], Nitrofurantoin, and Sulfa antibiotics   Review of Systems Review of Systems  Constitutional: Positive for diaphoresis. Negative for chills and fever.  HENT: Negative.   Respiratory: Positive for shortness of breath. Negative for cough.   Cardiovascular: Negative.  Negative for chest pain and leg swelling.  Gastrointestinal: Positive for abdominal pain and nausea. Negative for vomiting.  Genitourinary: Positive for flank pain. Negative for dysuria.  Musculoskeletal: Positive for back pain.  Skin: Negative.   Neurological: Positive for weakness.     Physical Exam Updated Vital Signs BP (!) 146/95   Pulse 77   Resp 13   SpO2 97%   Physical Exam Vitals signs and nursing note reviewed.  Constitutional:      General: She is not in acute distress.    Appearance: She is well-developed.     Comments: Nontoxic appearing, oriented, pleasant 85 yo patient  HENT:     Head: Normocephalic.  Neck:     Musculoskeletal: Normal range of motion and neck supple.  Cardiovascular:     Rate and Rhythm: Normal rate and regular rhythm.     Heart sounds: Murmur present.  Pulmonary:     Effort: Pulmonary effort is normal.     Breath sounds: Normal breath sounds. No wheezing, rhonchi or rales.  Chest:     Chest wall: No tenderness.  Abdominal:     General: Bowel sounds are normal.     Palpations: Abdomen is soft.     Tenderness: There is no  abdominal tenderness. There is no guarding or rebound.  Musculoskeletal: Normal range of motion.  Skin:    General: Skin is warm and dry.     Findings: No rash.  Neurological:     Mental Status: She is alert and oriented to person, place, and time.      ED Treatments / Results  Labs (all labs ordered are listed, but only abnormal results are displayed) Labs Reviewed  BASIC METABOLIC PANEL - Abnormal; Notable for the following components:      Result Value   Potassium 3.2 (*)    CO2 21 (*)    Glucose, Bld 108 (*)    Creatinine, Ser 1.49 (*)  GFR calc non Af Amer 31 (*)    GFR calc Af Amer 36 (*)    All other components within normal limits  URINE CULTURE  CBC  URINALYSIS, ROUTINE W REFLEX MICROSCOPIC  TROPONIN I (HIGH SENSITIVITY)  TROPONIN I (HIGH SENSITIVITY)    EKG EKG Interpretation  Date/Time:  Wednesday October 16 2019 16:12:22 EST Ventricular Rate:  74 PR Interval:  190 QRS Duration: 86 QT Interval:  430 QTC Calculation: 477 R Axis:   31 Text Interpretation: Normal sinus rhythm Normal ECG No significant change since last tracing Confirmed by Deno Etienne [phone]) on 10/16/2019 10:06:29 PM   Radiology Dg Chest 2 View  Result Date: 10/16/2019 CLINICAL DATA:  Shortness of breath. EXAM: CHEST - 2 VIEW COMPARISON:  Slice second [id number] FINDINGS: There is a dual chamber left-sided pacemaker in place. The heart size is normal. Aortic calcifications are again noted. There is some mild blunting of the costophrenic angles which is similar to prior study. The lungs are hyperexpanded. There is no acute osseous abnormality. No pneumothorax. A hiatal hernia is noted. There is a calcified granuloma in the left upper lobe. IMPRESSION: 1. No acute abnormality. 2. Moderate-sized hiatal hernia. Electronically Signed   By: Constance Holster M.D.   On: 10/16/2019 16:54    Procedures Procedures (including critical care time)  Medications Ordered in ED Medications  sodium  chloride flush (NS) 0.9 % injection 3 mL (has no administration in time range)     Initial Impression / Assessment and Plan / ED Course  I have reviewed the triage vital signs and the nursing notes.  Pertinent labs & imaging results that were available during my care of the patient were reviewed by me and considered in my medical decision making (see chart for details).        Patient to ED from [facility] primary care office by EMS for evaluation of DOE and diaphoresis with generalized weakness and right back and flank pain for the past 2-3 days, getting progressively worse. She denies chest pain.  Patient is nontoxic, but feels generally poor. She is not ambulated in the ED but comes with printed evaluation by Dr. Drema Dallas in the office today where DOE with diaphoresis was witnessed.   She was scanned to r/o PE and study is negative. There is a large LUL nodule, recommending repeat imaging at a later point, possibly after ?abx therapy. No cough.   Troponin x 2 negative. EKG NSR, no a-fib currently. UA positive for UTI. Rocephin started.   Feel the patient will require admission for treatment of weakness in the setting UTI, as well as further cardiac monitoring/evaluation given increasing DOE and diaphoresis.   Discussed admission with Dr. Hal Hope, [facility], who accepts for admission. Patient updated.  Final Clinical Impressions(s) / ED Diagnoses   Final diagnoses:  None   1. UTI 2. DOE 3. Generalized weakness  ED Discharge Orders    None       Charlann Lange, PA-C 10/17/19 [place], [place], DO 10/18/19 2303

## 2019-10-16 NOTE — ED Triage Notes (Signed)
Pt arrived by [facility] from dr office. Has sob with exertion for several days with right side lower back pain with diaphoresis.

## 2019-10-17 ENCOUNTER — Other Ambulatory Visit: Payer: Self-pay

## 2019-10-17 ENCOUNTER — Observation Stay (HOSPITAL_BASED_OUTPATIENT_CLINIC_OR_DEPARTMENT_OTHER): Payer: Medicare Other

## 2019-10-17 ENCOUNTER — Encounter (HOSPITAL_COMMUNITY): Payer: Self-pay | Admitting: Internal Medicine

## 2019-10-17 DIAGNOSIS — I480 Paroxysmal atrial fibrillation: Secondary | ICD-10-CM | POA: Diagnosis present

## 2019-10-17 DIAGNOSIS — I350 Nonrheumatic aortic (valve) stenosis: Secondary | ICD-10-CM

## 2019-10-17 DIAGNOSIS — Z87440 Personal history of urinary (tract) infections: Secondary | ICD-10-CM | POA: Diagnosis not present

## 2019-10-17 DIAGNOSIS — I160 Hypertensive urgency: Secondary | ICD-10-CM | POA: Diagnosis present

## 2019-10-17 DIAGNOSIS — M810 Age-related osteoporosis without current pathological fracture: Secondary | ICD-10-CM | POA: Diagnosis present

## 2019-10-17 DIAGNOSIS — Z79890 Hormone replacement therapy (postmenopausal): Secondary | ICD-10-CM | POA: Diagnosis not present

## 2019-10-17 DIAGNOSIS — N390 Urinary tract infection, site not specified: Secondary | ICD-10-CM

## 2019-10-17 DIAGNOSIS — E039 Hypothyroidism, unspecified: Secondary | ICD-10-CM | POA: Diagnosis present

## 2019-10-17 DIAGNOSIS — R531 Weakness: Secondary | ICD-10-CM | POA: Diagnosis not present

## 2019-10-17 DIAGNOSIS — I110 Hypertensive heart disease with heart failure: Secondary | ICD-10-CM | POA: Diagnosis present

## 2019-10-17 DIAGNOSIS — Z7901 Long term (current) use of anticoagulants: Secondary | ICD-10-CM | POA: Diagnosis not present

## 2019-10-17 DIAGNOSIS — I352 Nonrheumatic aortic (valve) stenosis with insufficiency: Secondary | ICD-10-CM | POA: Diagnosis present

## 2019-10-17 DIAGNOSIS — N3000 Acute cystitis without hematuria: Secondary | ICD-10-CM

## 2019-10-17 DIAGNOSIS — N179 Acute kidney failure, unspecified: Secondary | ICD-10-CM | POA: Diagnosis present

## 2019-10-17 DIAGNOSIS — I5032 Chronic diastolic (congestive) heart failure: Secondary | ICD-10-CM | POA: Diagnosis present

## 2019-10-17 DIAGNOSIS — F329 Major depressive disorder, single episode, unspecified: Secondary | ICD-10-CM | POA: Diagnosis present

## 2019-10-17 DIAGNOSIS — R931 Abnormal findings on diagnostic imaging of heart and coronary circulation: Secondary | ICD-10-CM | POA: Diagnosis not present

## 2019-10-17 DIAGNOSIS — R0609 Other forms of dyspnea: Secondary | ICD-10-CM | POA: Diagnosis present

## 2019-10-17 DIAGNOSIS — M1990 Unspecified osteoarthritis, unspecified site: Secondary | ICD-10-CM | POA: Diagnosis present

## 2019-10-17 DIAGNOSIS — I712 Thoracic aortic aneurysm, without rupture: Secondary | ICD-10-CM

## 2019-10-17 DIAGNOSIS — R911 Solitary pulmonary nodule: Secondary | ICD-10-CM | POA: Diagnosis present

## 2019-10-17 DIAGNOSIS — Z79899 Other long term (current) drug therapy: Secondary | ICD-10-CM | POA: Diagnosis not present

## 2019-10-17 DIAGNOSIS — Z66 Do not resuscitate: Secondary | ICD-10-CM | POA: Diagnosis present

## 2019-10-17 DIAGNOSIS — R0600 Dyspnea, unspecified: Secondary | ICD-10-CM

## 2019-10-17 DIAGNOSIS — K219 Gastro-esophageal reflux disease without esophagitis: Secondary | ICD-10-CM | POA: Diagnosis present

## 2019-10-17 DIAGNOSIS — Z20828 Contact with and (suspected) exposure to other viral communicable diseases: Secondary | ICD-10-CM | POA: Diagnosis present

## 2019-10-17 DIAGNOSIS — Z950 Presence of cardiac pacemaker: Secondary | ICD-10-CM | POA: Diagnosis not present

## 2019-10-17 DIAGNOSIS — F419 Anxiety disorder, unspecified: Secondary | ICD-10-CM | POA: Diagnosis present

## 2019-10-17 DIAGNOSIS — I10 Essential (primary) hypertension: Secondary | ICD-10-CM | POA: Diagnosis not present

## 2019-10-17 DIAGNOSIS — E876 Hypokalemia: Secondary | ICD-10-CM | POA: Diagnosis present

## 2019-10-17 DIAGNOSIS — R9439 Abnormal result of other cardiovascular function study: Secondary | ICD-10-CM | POA: Diagnosis not present

## 2019-10-17 DIAGNOSIS — Z8711 Personal history of peptic ulcer disease: Secondary | ICD-10-CM | POA: Diagnosis not present

## 2019-10-17 LAB — TSH: TSH: 0.687 u[IU]/mL (ref 0.350–4.500)

## 2019-10-17 LAB — CBC WITH DIFFERENTIAL/PLATELET
Abs Immature Granulocytes: 0.02 10*3/uL (ref 0.00–0.07)
Basophils Absolute: 0 10*3/uL (ref 0.0–0.1)
Basophils Relative: 1 %
Eosinophils Absolute: 0 10*3/uL (ref 0.0–0.5)
Eosinophils Relative: 0 %
HCT: 40.2 % (ref 36.0–46.0)
Hemoglobin: 13.5 g/dL (ref 12.0–15.0)
Immature Granulocytes: 0 %
Lymphocytes Relative: 13 %
Lymphs Abs: 0.9 10*3/uL (ref 0.7–4.0)
MCH: 31 pg (ref 26.0–34.0)
MCHC: 33.6 g/dL (ref 30.0–36.0)
MCV: 92.2 fL (ref 80.0–100.0)
Monocytes Absolute: 0.6 10*3/uL (ref 0.1–1.0)
Monocytes Relative: 9 %
Neutro Abs: 5.4 10*3/uL (ref 1.7–7.7)
Neutrophils Relative %: 77 %
Platelets: 194 10*3/uL (ref 150–400)
RBC: 4.36 MIL/uL (ref 3.87–5.11)
RDW: 13.4 % (ref 11.5–15.5)
WBC: 7 10*3/uL (ref 4.0–10.5)
nRBC: 0 % (ref 0.0–0.2)

## 2019-10-17 LAB — COMPREHENSIVE METABOLIC PANEL
ALT: 20 U/L (ref 0–44)
AST: 21 U/L (ref 15–41)
Albumin: 3.7 g/dL (ref 3.5–5.0)
Alkaline Phosphatase: 52 U/L (ref 38–126)
Anion gap: 13 (ref 5–15)
BUN: 15 mg/dL (ref 8–23)
CO2: 23 mmol/L (ref 22–32)
Calcium: 8.9 mg/dL (ref 8.9–10.3)
Chloride: 106 mmol/L (ref 98–111)
Creatinine, Ser: 1.25 mg/dL — ABNORMAL HIGH (ref 0.44–1.00)
GFR calc Af Amer: 45 mL/min — ABNORMAL LOW (ref >60)
GFR calc non Af Amer: 39 mL/min — ABNORMAL LOW (ref >60)
Glucose, Bld: 115 mg/dL — ABNORMAL HIGH (ref 70–99)
Potassium: 2.9 mmol/L — ABNORMAL LOW (ref 3.5–5.1)
Sodium: 142 mmol/L (ref 135–145)
Total Bilirubin: 1 mg/dL (ref 0.3–1.2)
Total Protein: 6.4 g/dL — ABNORMAL LOW (ref 6.5–8.1)

## 2019-10-17 LAB — BASIC METABOLIC PANEL
Anion gap: 9 (ref 5–15)
BUN: 13 mg/dL (ref 8–23)
CO2: 22 mmol/L (ref 22–32)
Calcium: 8.4 mg/dL — ABNORMAL LOW (ref 8.9–10.3)
Chloride: 108 mmol/L (ref 98–111)
Creatinine, Ser: 1.2 mg/dL — ABNORMAL HIGH (ref 0.44–1.00)
GFR calc Af Amer: 47 mL/min — ABNORMAL LOW (ref >60)
GFR calc non Af Amer: 41 mL/min — ABNORMAL LOW (ref >60)
Glucose, Bld: 122 mg/dL — ABNORMAL HIGH (ref 70–99)
Potassium: 3.6 mmol/L (ref 3.5–5.1)
Sodium: 139 mmol/L (ref 135–145)

## 2019-10-17 LAB — TROPONIN I (HIGH SENSITIVITY)
Troponin I (High Sensitivity): 10 ng/L (ref <18)
Troponin I (High Sensitivity): 17 ng/L (ref <18)
Troponin I (High Sensitivity): 24 ng/L — ABNORMAL HIGH (ref <18)

## 2019-10-17 LAB — MAGNESIUM: Magnesium: 2.1 mg/dL (ref 1.7–2.4)

## 2019-10-17 LAB — LIPASE, BLOOD: Lipase: 35 U/L (ref 11–51)

## 2019-10-17 LAB — BRAIN NATRIURETIC PEPTIDE: B Natriuretic Peptide: 189 pg/mL — ABNORMAL HIGH (ref 0.0–100.0)

## 2019-10-17 LAB — ECHOCARDIOGRAM COMPLETE

## 2019-10-17 LAB — SARS CORONAVIRUS 2 (TAT 6-24 HRS): SARS Coronavirus 2: NEGATIVE

## 2019-10-17 MED ORDER — POTASSIUM CHLORIDE CRYS ER 20 MEQ PO TBCR
60.0000 meq | EXTENDED_RELEASE_TABLET | Freq: Once | ORAL | Status: AC
  Administered 2019-10-17: 60 meq via ORAL
  Filled 2019-10-17: qty 3

## 2019-10-17 MED ORDER — ATENOLOL 25 MG PO TABS: 25.0000 mg | ORAL_TABLET | Freq: Every day | ORAL | Status: DC

## 2019-10-17 MED ORDER — ROSUVASTATIN CALCIUM 20 MG PO TABS
20.0000 mg | ORAL_TABLET | Freq: Every day | ORAL | Status: DC
  Administered 2019-10-17 – 2019-10-22 (×6): 20 mg via ORAL
  Filled 2019-10-17 (×6): qty 1

## 2019-10-17 MED ORDER — LABETALOL HCL 5 MG/ML IV SOLN
10.0000 mg | INTRAVENOUS | Status: DC | PRN
  Administered 2019-10-18: 10 mg via INTRAVENOUS
  Filled 2019-10-17: qty 4

## 2019-10-17 MED ORDER — FLUOXETINE HCL 20 MG PO CAPS
20.0000 mg | ORAL_CAPSULE | Freq: Every day | ORAL | Status: DC
  Administered 2019-10-17 – 2019-10-22 (×6): 20 mg via ORAL
  Filled 2019-10-17 (×7): qty 1

## 2019-10-17 MED ORDER — ACETAMINOPHEN 325 MG PO TABS
650.0000 mg | ORAL_TABLET | Freq: Four times a day (QID) | ORAL | Status: DC | PRN
  Filled 2019-10-17: qty 2

## 2019-10-17 MED ORDER — ALPRAZOLAM 0.5 MG PO TABS
0.5000 mg | ORAL_TABLET | Freq: Three times a day (TID) | ORAL | Status: DC | PRN
  Administered 2019-10-18 – 2019-10-21 (×3): 0.5 mg via ORAL
  Filled 2019-10-17 (×4): qty 1

## 2019-10-17 MED ORDER — SODIUM CHLORIDE 0.9 % IV SOLN
1.0000 g | INTRAVENOUS | Status: DC
  Administered 2019-10-17 – 2019-10-19 (×3): 1 g via INTRAVENOUS
  Filled 2019-10-17 (×3): qty 10

## 2019-10-17 MED ORDER — ONDANSETRON HCL 4 MG/2ML IJ SOLN: 4.0000 mg | Freq: Four times a day (QID) | INTRAMUSCULAR | Status: DC | PRN

## 2019-10-17 MED ORDER — ACETAMINOPHEN 650 MG RE SUPP: 650.0000 mg | Freq: Four times a day (QID) | RECTAL | Status: DC | PRN

## 2019-10-17 MED ORDER — APIXABAN 5 MG PO TABS
5.0000 mg | ORAL_TABLET | Freq: Two times a day (BID) | ORAL | Status: DC
  Administered 2019-10-17 – 2019-10-19 (×6): 5 mg via ORAL
  Filled 2019-10-17 (×8): qty 1

## 2019-10-17 MED ORDER — ATENOLOL 25 MG PO TABS
25.0000 mg | ORAL_TABLET | Freq: Every day | ORAL | Status: DC
  Administered 2019-10-17 – 2019-10-22 (×6): 25 mg via ORAL
  Filled 2019-10-17 (×7): qty 1

## 2019-10-17 MED ORDER — LEVOTHYROXINE SODIUM 112 MCG PO TABS
112.0000 ug | ORAL_TABLET | Freq: Every day | ORAL | Status: DC
  Administered 2019-10-17 – 2019-10-22 (×5): 112 ug via ORAL
  Filled 2019-10-17 (×6): qty 1

## 2019-10-17 MED ORDER — ONDANSETRON HCL 4 MG PO TABS: 4.0000 mg | ORAL_TABLET | Freq: Four times a day (QID) | ORAL | Status: DC | PRN

## 2019-10-17 MED ORDER — POTASSIUM CHLORIDE 20 MEQ PO PACK
40.0000 meq | PACK | Freq: Two times a day (BID) | ORAL | Status: DC
  Administered 2019-10-17 – 2019-10-20 (×6): 40 meq via ORAL
  Filled 2019-10-17 (×7): qty 2

## 2019-10-17 MED ORDER — POTASSIUM CHLORIDE 10 MEQ/100ML IV SOLN
10.0000 meq | INTRAVENOUS | Status: AC
  Administered 2019-10-17 (×2): 10 meq via INTRAVENOUS
  Filled 2019-10-17 (×2): qty 100

## 2019-10-17 MED ORDER — POTASSIUM CHLORIDE CRYS ER 20 MEQ PO TBCR
40.0000 meq | EXTENDED_RELEASE_TABLET | Freq: Two times a day (BID) | ORAL | Status: DC
  Filled 2019-10-17: qty 2

## 2019-10-17 MED ORDER — VITAMIN B-12 100 MCG PO TABS
100.0000 ug | ORAL_TABLET | Freq: Every day | ORAL | Status: DC
  Administered 2019-10-18 – 2019-10-22 (×5): 100 ug via ORAL
  Filled 2019-10-17 (×6): qty 1

## 2019-10-17 NOTE — ED Notes (Signed)
Pt HR noted to be 110's-120's. Pt now in afib. EKG obtained and given to [facility].

## 2019-10-17 NOTE — H&P (Addendum)
History and Physical    Faith Parker [id number] DOB: 09-06-1930 DOA: 10/16/2019  PCP: Leighton Ruff, MD  Patient coming from: Patient lives at friend's home.  Chief Complaint: Shortness of breath.  HPI: Faith Parker is a 85 y.o. female with history of A. fib, pacemaker placement for intermittent pauses, hypertension, ascending aortic aneurysm, hypothyroidism chronic UTI was brought to the ER the patient has been having increasing exertional dyspnea over the last few weeks.  Denies any chest pain.  Has been having increasing frequency of urination.  Last few days patient also had some mid back pain radiating to right side.  Patient started having some diaphoresis yesterday and this was concerning and was brought to the ER.  ED Course: In the ER patient is afebrile with initial EKG showing normal sinus rhythm.  CT angiogram of the chest was done which was negative for pulmonary embolism did show a left upper lobe lung nodule and a stable ascending aortic aneurysm.  UA is consistent with UTI.  High-sensitivity troponin was negative.  Given the symptoms patient is admitted for further observation.  Patient also was started on ceftriaxone for UTI.  Labs show potassium of 3.2 creatinine 1.4 which is increased from baseline WBC 6.8 hemoglobin 13.1 platelets 210.  Review of Systems: As per HPI, rest all negative.   Past Medical History:  Diagnosis Date   Anxiety    Aortic regurgitation    Gastritis    GERD (gastroesophageal reflux disease)    H/O artificial eye lens    Hypertension    Hypothyroid    Left ventricular hypertrophy    Major depression    Osteoarthritis    Osteoporosis    Peptic ulcer disease    Stress incontinence     Past Surgical History:  Procedure Laterality Date   LEG SURGERY Right    multiple surgeries, unknown type   PACEMAKER IMPLANT N/A 06/04/2018   Procedure: PACEMAKER IMPLANT;  Surgeon: Deboraha Sprang, MD;  Location: [facility];  Service: Cardiovascular;  Laterality: N/A;     reports that she has never smoked. She has never used smokeless tobacco. She reports that she does not drink alcohol or use drugs.  Allergies  Allergen Reactions   Ciprofloxacin Other (See Comments)    Unknown   Latex Other (See Comments)    Unknown   Levofloxacin Other (See Comments)    Unknown   Macrobid [Nitrofurantoin Monohyd Macro] Other (See Comments)    Unknown   Nitrofurantoin Other (See Comments)    Unknown   Sulfa Antibiotics Other (See Comments)    Unknown    Family History  Problem Relation Age of Onset   Coronary artery disease Neg Hx     Prior to Admission medications   Medication Sig Start Date End Date Taking? Authorizing Provider  apixaban (ELIQUIS) 5 MG TABS tablet Take 1 tablet (5 mg total) by mouth 2 (two) times daily. Please call Cardiology to schedule follow-up appt. 10/04/19  Yes Deboraha Sprang, MD  ALPRAZolam Duanne Moron) 0.5 MG tablet Take 0.5 mg by mouth 3 (three) times daily as needed for anxiety.    [provider]  atenolol (TENORMIN) 25 MG tablet Take 25 mg by mouth daily.     [provider]  cephALEXin (KEFLEX) 250 MG capsule Take 1 capsule by mouth daily. 06/05/18   [provider]  Cholecalciferol (VITAMIN D3) 5000 units TABS Take 5,000 Units by mouth daily.    [provider]  FLUoxetine (PROZAC) 20 MG tablet Take 20 mg by mouth daily.    [provider]  levothyroxine (SYNTHROID, LEVOTHROID) 112 MCG tablet Take 112 mcg by mouth daily before breakfast.    [provider]  losartan (COZAAR) 100 MG tablet Take 100 mg by mouth daily.     [provider]  Multiple Vitamins-Minerals (MULTIVITAMIN PO) Take 1 tablet by mouth daily.    [provider]  Omega-3 Fatty Acids (FISH OIL) 1200 MG CAPS Take 4 capsules by mouth daily.    [provider]  Polyethyl Glycol-Propyl Glycol (SYSTANE) 0.4-0.3 % SOLN Place 1 drop into  both eyes at bedtime.    [provider]  Probiotic Product (ALIGN PO) Take 1 capsule by mouth daily.     [provider]  rosuvastatin (CRESTOR) 20 MG tablet TAKE 1 TABLET BY MOUTH DAILY. 10/09/19   Fay Records, MD  traMADol (ULTRAM) 50 MG tablet Take 50 mg by mouth every 6 (six) hours as needed for moderate pain.     [provider]  vitamin B-12 (CYANOCOBALAMIN) 100 MCG tablet Take 100 mcg by mouth daily.    [provider]  vitamin C (ASCORBIC ACID) 500 MG tablet Take 500 mg by mouth daily.    [provider]    Physical Exam: Constitutional: Moderately built and nourished. Vitals:   10/17/19 0100 10/17/19 0130 10/17/19 0200 10/17/19 0215  BP: (!) 190/97 (!) 150/71    Pulse: 74 88 (!) 124 (!) 109  Resp: 14 (!) 25 19 19   Temp:      TempSrc:      SpO2: 99% 99% 90% 96%   Eyes: Anicteric no pallor. ENMT: No discharge from the ears eyes nose or mouth. Neck: No mass or.  No neck rigidity. Respiratory: No rhonchi or crepitations. Cardiovascular: S1-S2 heard. Abdomen: Soft nontender bowel sounds present. Musculoskeletal: No edema.  No joint effusion. Skin: No rash. Neurologic: Alert awake oriented to time place and person.  Moves all extremities. Psychiatric: Appears normal per normal affect.   Labs on Admission: I have personally reviewed following labs and imaging studies  CBC: Recent Labs  Lab 10/16/19 1605  WBC 6.8  HGB 13.1  HCT 40.5  MCV 94.4  PLT [id number]   Basic Metabolic Panel: Recent Labs  Lab 10/16/19 1605  NA 139  K 3.2*  CL 105  CO2 21*  GLUCOSE 108*  BUN 16  CREATININE 1.49*  CALCIUM 9.2   GFR: CrCl cannot be calculated (Unknown ideal weight.). Liver Function Tests: No results for input(s): AST, ALT, ALKPHOS, BILITOT, PROT, ALBUMIN in the last 168 hours. No results for input(s): LIPASE, AMYLASE in the last 168 hours. No results for input(s): AMMONIA in the last 168 hours. Coagulation Profile: No results  for input(s): INR, PROTIME in the last 168 hours. Cardiac Enzymes: No results for input(s): CKTOTAL, CKMB, CKMBINDEX, TROPONINI in the last 168 hours. BNP (last 3 results) No results for input(s): PROBNP in the last 8760 hours. HbA1C: No results for input(s): HGBA1C in the last 72 hours. CBG: No results for input(s): GLUCAP in the last 168 hours. Lipid Profile: No results for input(s): CHOL, HDL, LDLCALC, TRIG, CHOLHDL, LDLDIRECT in the last 72 hours. Thyroid Function Tests: No results for input(s): TSH, T4TOTAL, FREET4, T3FREE, THYROIDAB in the last 72 hours. Anemia Panel: No results for input(s): VITAMINB12, FOLATE, FERRITIN, TIBC, IRON, RETICCTPCT in the last 72 hours. Urine analysis:    Component Value Date/Time   COLORURINE YELLOW 10/16/2019  1923   APPEARANCEUR CLOUDY (A) 10/16/2019 1923   LABSPEC 1.014 10/16/2019 1923   PHURINE 6.0 10/16/2019 1923   GLUCOSEU 50 (A) 10/16/2019 1923   HGBUR SMALL (A) 10/16/2019 1923   BILIRUBINUR NEGATIVE 10/16/2019 1923   KETONESUR 5 (A) 10/16/2019 1923   PROTEINUR >=300 (A) 10/16/2019 1923   UROBILINOGEN 0.2 06/30/2016 1149   NITRITE NEGATIVE 10/16/2019 1923   LEUKOCYTESUR NEGATIVE 10/16/2019 1923   Sepsis Labs: @LABRCNTIP (procalcitonin:4,lacticidven:4) )No results found for this or any previous visit (from the past 240 hour(s)).   Radiological Exams on Admission: Dg Chest 2 View  Result Date: 10/16/2019 CLINICAL DATA:  Shortness of breath. EXAM: CHEST - 2 VIEW COMPARISON:  Slice second [id number] FINDINGS: There is a dual chamber left-sided pacemaker in place. The heart size is normal. Aortic calcifications are again noted. There is some mild blunting of the costophrenic angles which is similar to prior study. The lungs are hyperexpanded. There is no acute osseous abnormality. No pneumothorax. A hiatal hernia is noted. There is a calcified granuloma in the left upper lobe. IMPRESSION: 1. No acute abnormality. 2. Moderate-sized hiatal hernia.  Electronically Signed   By: Constance Holster M.D.   On: 10/16/2019 16:54   Ct Angio Chest Pe W And/or Wo Contrast  Result Date: 10/16/2019 CLINICAL DATA:  Shortness of breath, diaphoresis EXAM: CT ANGIOGRAPHY CHEST WITH CONTRAST TECHNIQUE: Multidetector CT imaging of the chest was performed using the standard protocol during bolus administration of intravenous contrast. Multiplanar CT image reconstructions and MIPs were obtained to evaluate the vascular anatomy. CONTRAST:  10mL OMNIPAQUE IOHEXOL 350 MG/ML SOLN COMPARISON:  Radiograph same day, CT chest February 22, 2018 he FINDINGS: Cardiovascular: There is a optimal opacification of the pulmonary arteries. There is no central,segmental, or subsegmental filling defects within the pulmonary arteries. There is mild cardiomegaly with left ventricular hypertrophy. Aortic valve calcifications are noted. Again noted is aneurysmal dilatation of the ascending intrathoracic aorta measuring 4.1 cm which tapers at the level of the aortic arch. Scattered aortic atherosclerotic calcifications are seen. Mediastinum/Nodes: No hilar, mediastinal, or axillary adenopathy. There is a moderate hiatal hernia seen. Lungs/Pleura: There is a small ill-defined nodular opacity in the anterior left upper lobe measuring 2.4 cm, series 6, image 80. A small amount of streaky/nodular opacity seen at the right lung base. No other large airspace consolidation or pleural effusion. Upper Abdomen: No acute abnormalities present in the visualized portions of the upper abdomen. Musculoskeletal: No chest wall abnormality. No acute or significant osseous findings. A left-sided pacemaker seen with the lead tips right atrium right ventricle. Again noted are slight superior compression deformities of the T6 and T7 vertebral bodies as on prior exam. Review of the MIP images confirms the above findings. IMPRESSION: 1.  No central, segmental, or subsegmental pulmonary embolism. 2. Stable intrathoracic  ascending aortic aneurysm measuring 4.1 cm in transverse dimension. 3. Ill-defined nodular opacity in the anterior left upper lobe measuring 2.4 cm which could represent a focus of atelectasis or early infectious etiology. Followup PA and lateral chest X-ray is recommended in 3-4 weeks following trial of antibiotic therapy to ensure resolution and exclude underlying malignancy. 4. Moderate hiatal hernia 5. Chronic superior compression deformities of the midthoracic spine. Electronically Signed   By: Prudencio Pair M.D.   On: 10/16/2019 23:42    EKG: Independently reviewed.  Normal sinus rhythm.  Assessment/Plan Active Problems:   Hypothyroidism   Thoracic aortic aneurysm [facility]): a. 4.1 cm ascending thoracic aortic aneurysm 01/23/17   PAF (paroxysmal  atrial fibrillation) (HCC)   DOE (dyspnea on exertion)   ARF (acute renal failure) (HCC)   Acute lower UTI    1. Exertional dyspnea cause not clear.  Last 2D echo done was in July 2019 which showed EF of 65 to 70% with grade 2 diastolic dysfunction.  At this time patient does not look to be in fluid overload.  Will trend cardiac markers closely observe in telemetry cardiology consult in the morning. 2. UTI on empiric antibiotics follow cultures. 3. A. fib on apixaban and atenolol. 4. History of pacemaker placement for pulses. 5. Hypothyroidism on Synthroid check TSH. 6. Ascending aortic aneurysm appears to be stable. 7. Left upper lobe nodule will need further work-up as outpatient. 8. Hypertension uncontrolled likely contributing to patient's symptoms presently holding ARB and will keep patient on as needed labetalol.  ARB on hold due to acute renal failure. 9. Acute renal failure cause not clear.  Will hold ARB for now and follow metabolic panel.  Addendum -patient's heart rate has increased and went into A. fib presently 120 bpm.  I am ordering patient's home dose of atenolol.  If does not improve will need to start on Cardizem infusion.   DVT  prophylaxis: Apixaban. Code Status: Full code. Family Communication: Discussed with patient. Disposition Plan: Home. Consults called: Cardiology. Admission status: Observation.   [name] MD [facility] Pager [phone].  If 7PM-7AM, please contact night-coverage [email or web address] Password [facility]  10/17/2019, 2:31 AM

## 2019-10-17 NOTE — Progress Notes (Signed)
Echocardiogram 2D Echocardiogram has been performed.  Faith Parker 10/17/2019, 1:51 PM

## 2019-10-17 NOTE — Consult Note (Addendum)
Cardiology Consultation:   Patient ID: Faith Parker MRN: [id number]; DOB: July 25, 1930  Admit date: 10/16/2019 Date of Consult: 10/17/2019  Primary Care Provider: Leighton Ruff, MD Primary Cardiologist: Faith Carnes, MD  Primary Electrophysiologist:  None    Patient Profile:   Faith Parker is a 85 y.o. female with a hx of Paroxysmal Afib, pauses s/p pacemaker, HTN, Aortic stenosis, ascending aortic aneurysm (4.1 cm 02/22/2018), hypothyroidism, and chronic UTI who is being seen today for the evaluation of exertional dyspnea and mid back pain at the request of Dr. Jacinta Parker.  History of Present Illness:   The patient follows with Dr. Harrington Parker for the above cardiac problems. In 2018 the patient was diagnosed with afib. Echo at that time  showed preserved EF with mild AS/AI. CT chest was negative for PE but did show aortic aneurysm of 4.1 cm. In 2019 the patient was admitted with chest discomfort/syncope found to have pauses of 6-7 seconds and underwent PPI placement. Echo showed EF 65-70% with moderate LAD, G2DD. Patient follows with Dr. Caryl Parker for the pacemaker. Her last pacemaker check was 09/10/19 showing AT/AF burden 5.3%  [name] presented to the ER 10/16/19 for progressive shortness of breath and lower back pain. Shortness of breath began 1 week ago, only on exertion. No chest pain. She was having trouble walking around her apartment. Lower back pain started a couple days ago and was severe. Denied dysuria or increased frequency f urination. The patient was seen in her primary care office yesterday and was noted to have high BP (systolics [id number]?) and with her back pain she was recommended to go the the ER for evaluation.    In the ED BP 146/95, pulse 77, RR 13, O2 97%. Labs showed potassium 3.2, CO2 21, glucose 108, creatinine 1.49. WBC 6.8, Hgb 13.1. HS troponin 10>17>24. EKG showed NSR with no ischemic chages. UA was positive for UTI and abx were started. CXR showed no acute abnormality. CT  angio chest showed stable intrathoracic ascending aortic aneurysm 4.1 cm and nodular opacity in left upper lobe 2.4 cm.  Patient lives at an assisted living facility and takes care of herself. She denies recent lower leg edema or orthopnea.   Heart Pathway Score:     Past Medical History:  Diagnosis Date  . Anxiety   . Aortic regurgitation   . Gastritis   . GERD (gastroesophageal reflux disease)   . H/O artificial eye lens   . Hypertension   . Hypothyroid   . Left ventricular hypertrophy   . Major depression   . Osteoarthritis   . Osteoporosis   . Peptic ulcer disease   . Stress incontinence     Past Surgical History:  Procedure Laterality Date  . LEG SURGERY Right    multiple surgeries, unknown type  . PACEMAKER IMPLANT N/A 06/04/2018   Procedure: PACEMAKER IMPLANT;  Surgeon: Deboraha Sprang, MD;  Location: [facility];  Service: Cardiovascular;  Laterality: N/A;     Home Medications:  Prior to Admission medications   Medication Sig Start Date End Date Taking? Authorizing Provider  ALPRAZolam Duanne Moron) 0.5 MG tablet Take 0.5 mg by mouth 3 (three) times daily as needed for anxiety.   Yes [provider]  apixaban (ELIQUIS) 5 MG TABS tablet Take 1 tablet (5 mg total) by mouth 2 (two) times daily. Please call Cardiology to schedule follow-up appt. 10/04/19  Yes Deboraha Sprang, MD  atenolol (TENORMIN) 25 MG tablet Take 25 mg by mouth daily.  Yes [provider]  cephALEXin (KEFLEX) 250 MG capsule Take 1 capsule by mouth daily. 06/05/18  Yes [provider]  Cholecalciferol (VITAMIN D3) 5000 units TABS Take 5,000 Units by mouth daily.   Yes [provider]  FLUoxetine (PROZAC) 20 MG tablet Take 20 mg by mouth daily.   Yes [provider]  levothyroxine (SYNTHROID) 100 MCG tablet Take 100 mcg by mouth daily before breakfast.    Yes [provider]  losartan (COZAAR) 100 MG tablet Take 100 mg by mouth daily.    Yes [provider]  Multiple Vitamins-Minerals (MULTIVITAMIN PO) Take 1 tablet by mouth daily.   Yes [provider]  Omega-3 Fatty Acids (FISH OIL) 1200 MG CAPS Take 4 capsules by mouth daily.   Yes [provider]  Polyethyl Glycol-Propyl Glycol (SYSTANE) 0.4-0.3 % SOLN Place 1 drop into both eyes at bedtime.   Yes [provider]  rosuvastatin (CRESTOR) 20 MG tablet TAKE 1 TABLET BY MOUTH DAILY. Patient taking differently: Take 20 mg by mouth daily.  10/09/19  Yes Fay Records, MD  traMADol (ULTRAM) 50 MG tablet Take 50 mg by mouth every 6 (six) hours as needed for moderate pain.    Yes [provider]  vitamin B-12 (CYANOCOBALAMIN) 100 MCG tablet Take 100 mcg by mouth daily.   Yes [provider]  vitamin C (ASCORBIC ACID) 500 MG tablet Take 500 mg by mouth daily.   Yes [provider]  Probiotic Product (ALIGN PO) Take 1 capsule by mouth daily.     [provider]    Inpatient Medications: Scheduled Meds: . apixaban  5 mg Oral BID  . atenolol  25 mg Oral Daily  . FLUoxetine  20 mg Oral Daily  . levothyroxine  112 mcg Oral Q0600  . rosuvastatin  20 mg Oral Daily  . sodium chloride flush  3 mL Intravenous Once  . vitamin B-12  100 mcg Oral Daily   Continuous Infusions: . cefTRIAXone (ROCEPHIN)  IV     PRN Meds: acetaminophen **OR** acetaminophen, ALPRAZolam, labetalol, ondansetron **OR** ondansetron (ZOFRAN) IV  Allergies:    Allergies  Allergen Reactions  . Ciprofloxacin Other (See Comments)    Unknown  . Latex Other (See Comments)    Unknown  . Levofloxacin Other (See Comments)    Unknown  . Macrobid [employer] Macro] Other (See Comments)    Unknown  . Nitrofurantoin Other (See Comments)    Unknown  . Sulfa Antibiotics Other (See Comments)    Unknown    Social History:   Social History   Socioeconomic History  . Marital status: Widowed    Spouse name: Not on file  . Number of children: Not  on file  . Years of education: Not on file  . Highest education level: Not on file  Occupational History  . Occupation: Retired  Scientific laboratory technician  . Financial resource strain: Not on file  . Food insecurity    Worry: Not on file    Inability: Not on file  . Transportation needs    Medical: Not on file    Non-medical: Not on file  Tobacco Use  . Smoking status: Never Smoker  . Smokeless tobacco: Never Used  Substance and Sexual Activity  . Alcohol use: No  . Drug use: No  . Sexual activity: Not on file  Lifestyle  . Physical activity    Days per week: Not on file    Minutes per session: Not  on file  . Stress: Not on file  Relationships  . Social Herbalist on phone: Not on file    Gets together: Not on file    Attends religious service: Not on file    Active member of club or organization: Not on file    Attends meetings of clubs or organizations: Not on file    Relationship status: Not on file  . Intimate partner violence    Fear of current or ex partner: Not on file    Emotionally abused: Not on file    Physically abused: Not on file    Forced sexual activity: Not on file  Other Topics Concern  . Not on file  Social History Narrative   Pt in independent living apartment at [facility].    Family History:   Family History  Problem Relation Age of Onset  . Coronary artery disease Neg Hx      ROS:  Please see the history of present illness.  All other ROS reviewed and negative.     Physical Exam/Data:   Vitals:   10/17/19 1000 10/17/19 1015 10/17/19 1030 10/17/19 1100  BP: 128/61  127/71 133/67  Pulse:    66  Resp: (!) 23 19 17 17   Temp:      TempSrc:      SpO2:    96%    Intake/Output Summary (Last 24 hours) at 10/17/2019 1136 Last data filed at 10/17/2019 0846 Gross per 24 hour  Intake 200 ml  Output -  Net 200 ml   Last 3 Weights 09/11/2018 07/02/2018 06/05/2018  Weight (lbs) 217 lb 12.8 oz 213 lb 218 lb 0.6 oz  Weight (kg) 98.793 kg  96.616 kg 98.9 kg     There is no height or weight on file to calculate BMI.  General:  Well nourished, well developed, in no acute distress HEENT: normal Lymph: no adenopathy Neck: no JVD Endocrine:  No thryomegaly Vascular: No carotid bruits; FA pulses 2+ bilaterally without bruits  Cardiac:  normal S1, S2; RRR; systolic murmur  Lungs:  clear to auscultation bilaterally, no wheezing, rhonchi or rales  Abd: soft, nontender, no hepatomegaly  Ext: no edema Musculoskeletal:  Lower back pain; No deformities, BUE and BLE strength normal and equal Skin: warm and dry  Neuro:  CNs 2-12 intact, no focal abnormalities noted Psych:  Normal affect   EKG:  The EKG was personally reviewed and demonstrates:  Yesterday on arrival with NSR, 74 bpm, PR 190, no ST elevation Today Afib RVR, 121 bpm, repol abnormalities, QTc 545 ms  Telemetry:  Telemetry was personally reviewed and demonstrates:  NSR; HR 80-90s, peak rate 104.   Relevant CV Studies:  Echo 2019 Study Conclusions  - Left ventricle: The cavity size was normal. Wall thickness was   normal. Systolic function was vigorous. The estimated ejection   fraction was in the range of 65% to 70%. Features are consistent   with a pseudonormal left ventricular filling pattern, with   concomitant abnormal relaxation and increased filling pressure   (grade 2 diastolic dysfunction). - Aortic valve: There was mild stenosis. Valve area (VTI): 2.02   cm^2. Valve area (Vmax): 1.76 cm^2. Valve area (Vmean): 1.81   cm^2. - Left atrium: The atrium was moderately dilated.  Laboratory Data:  High Sensitivity Troponin:   Recent Labs  Lab 10/16/19 1605 10/16/19 2311 10/17/19 0325 10/17/19 0557  TROPONINIHS 9 10 17  24*     Chemistry Recent Labs  Lab 10/16/19 1605 10/17/19 0325  NA 139 142  K 3.2* 2.9*  CL 105 106  CO2 21* 23  GLUCOSE 108* 115*  BUN 16 15  CREATININE 1.49* 1.25*  CALCIUM 9.2 8.9  GFRNONAA 31* 39*  GFRAA 36* 45*   ANIONGAP 13 13    Recent Labs  Lab 10/17/19 0325  PROT 6.4*  ALBUMIN 3.7  AST 21  ALT 20  ALKPHOS 52  BILITOT 1.0   Hematology Recent Labs  Lab 10/16/19 1605 10/17/19 0325  WBC 6.8 7.0  RBC 4.29 4.36  HGB 13.1 13.5  HCT 40.5 40.2  MCV 94.4 92.2  MCH 30.5 31.0  MCHC 32.3 33.6  RDW 13.6 13.4  PLT 210 194   BNPNo results for input(s): BNP, PROBNP in the last 168 hours.  DDimer No results for input(s): DDIMER in the last 168 hours.   Radiology/Studies:  Dg Chest 2 View  Result Date: 10/16/2019 CLINICAL DATA:  Shortness of breath. EXAM: CHEST - 2 VIEW COMPARISON:  Slice second [id number] FINDINGS: There is a dual chamber left-sided pacemaker in place. The heart size is normal. Aortic calcifications are again noted. There is some mild blunting of the costophrenic angles which is similar to prior study. The lungs are hyperexpanded. There is no acute osseous abnormality. No pneumothorax. A hiatal hernia is noted. There is a calcified granuloma in the left upper lobe. IMPRESSION: 1. No acute abnormality. 2. Moderate-sized hiatal hernia. Electronically Signed   By: Constance Holster M.D.   On: 10/16/2019 16:54   Ct Angio Chest Pe W And/or Wo Contrast  Result Date: 10/16/2019 CLINICAL DATA:  Shortness of breath, diaphoresis EXAM: CT ANGIOGRAPHY CHEST WITH CONTRAST TECHNIQUE: Multidetector CT imaging of the chest was performed using the standard protocol during bolus administration of intravenous contrast. Multiplanar CT image reconstructions and MIPs were obtained to evaluate the vascular anatomy. CONTRAST:  13mL OMNIPAQUE IOHEXOL 350 MG/ML SOLN COMPARISON:  Radiograph same day, CT chest February 22, 2018 he FINDINGS: Cardiovascular: There is a optimal opacification of the pulmonary arteries. There is no central,segmental, or subsegmental filling defects within the pulmonary arteries. There is mild cardiomegaly with left ventricular hypertrophy. Aortic valve calcifications are noted. Again  noted is aneurysmal dilatation of the ascending intrathoracic aorta measuring 4.1 cm which tapers at the level of the aortic arch. Scattered aortic atherosclerotic calcifications are seen. Mediastinum/Nodes: No hilar, mediastinal, or axillary adenopathy. There is a moderate hiatal hernia seen. Lungs/Pleura: There is a small ill-defined nodular opacity in the anterior left upper lobe measuring 2.4 cm, series 6, image 80. A small amount of streaky/nodular opacity seen at the right lung base. No other large airspace consolidation or pleural effusion. Upper Abdomen: No acute abnormalities present in the visualized portions of the upper abdomen. Musculoskeletal: No chest wall abnormality. No acute or significant osseous findings. A left-sided pacemaker seen with the lead tips right atrium right ventricle. Again noted are slight superior compression deformities of the T6 and T7 vertebral bodies as on prior exam. Review of the MIP images confirms the above findings. IMPRESSION: 1.  No central, segmental, or subsegmental pulmonary embolism. 2. Stable intrathoracic ascending aortic aneurysm measuring 4.1 cm in transverse dimension. 3. Ill-defined nodular opacity in the anterior left upper lobe measuring 2.4 cm which could represent a focus of atelectasis or early infectious etiology. Followup PA and lateral chest X-ray is recommended in 3-4 weeks following trial of antibiotic therapy to ensure resolution and exclude underlying malignancy. 4. Moderate hiatal hernia 5. Chronic superior  compression deformities of the midthoracic spine. Electronically Signed   By: Prudencio Pair M.D.   On: 10/16/2019 23:42    Assessment and Plan:   Exertional Dyspnea Patient reported DOE for 1 week. Unsure etiology at this point. CXR with no acute disease. CT chest negative for PE and stable aortic aneurysm. HS troponin trend flat> not consistent with ACS. Hgb 13.5. ?Could be due to intermittent afib. Patient says she is short of breath with  palpitations when in afib.  - Will check an echo for EF and possible worsening AS/AI - Will check a BNP - Patient does not appear to be fluid overloaded on exam. Lungs CTA.  Paroxysmal Afib On arrival patient was in NSR>> went into Afib RVR, rates to the 120s. Rates now down to the 60s. Was on atenolol 25 mg daily for rate control  - It looks like patient in in NSR during my exam. Will check on EKG - continue BB for rate control  - CHADSVASC = 4 (female, age, CHF, HTN) - Continue Eliquis - TSH 0.687, potassium 3.6>> supplement, magnesium 2.1 - Continue Telemetry  Chronic Diastolic HF - EF [id number] with preserved EF and G2DD - will recheck echo - check BNP - Doe snot appear to be decompensated at this time - continue BB  Pauses s/p pacemaker - [employer] device - last interrogation 09/10/19 showing AT/AF 5.3% - Will discuss with MD whether she wants another interogation  UTI/lower back pain - abx per IM  Mild Aortic Stenosis/AI - per echo in 2019 - recheck echo for any changes  HTN - Pressures improving - continue BB. ARB on hold for AKI  AKI - creatinine 1.49 on admission. baseline 0.86 - creatinine improving. 1.2 today - ARB held  Ascending aortic aneurysm - stable per CT chest 4.1 cm   Hypothyroidism - continue synthroid  For questions or updates, please contact [place] Please consult [email or web address] for contact info under   Signed, Cadence Ninfa Meeker, PA-C  10/17/2019 11:36 AM   The patient was seen, examined and discussed with Cadence Ninfa Meeker, PA-C   and I agree with the above.   85 y.o. female with a hx of Paroxysmal Afib, pauses s/p pacemaker, HTN, Aortic stenosis, ascending aortic aneurysm (4.1 cm 02/22/2018), hypothyroidism, and chronic UTI who is being seen today for the evaluation of exertional dyspnea.  She is a patient of Dr. Harrington Parker and Dr. Caryl Parker, last seen in October 2019.  She has history of paroxysmal A. fib with significant pauses status post  pacemaker placement, she has noted mild aortic stenosis and regurgitation based on echo in 2018 with hyperdynamic LVEF.  Her shortness of breath is exertional and started about a week ago, no associated chest pain orthopnea proximal nocturnal dyspnea.  Her pacemaker was interrogated a week ago and showed A. fib burden of 5%.  On physical exam patient has no JVDs she is clear lungs she has 5 out of 6 systolic murmur, warm extremities with good pulses.  Her troponins are negative.  I have reviewed her chest CTA for pulmonary embolism and it shows mild coronary calcifications and RCA and circumflex artery.  She also has history of ascending aortic aneurysm measuring 41 mm that is stable when compared to the prior CT a year ago.  We will await echo results to see if her aortic stenosis is severe, if not we will consider ordering a stress test tomorrow. She is on antibiotics for UTI. Ena Dawley, MD 10/17/2019

## 2019-10-17 NOTE — Progress Notes (Signed)
Received call from [place] [phone]) with [facility]; patient is a resident at their [facility]. Mindi Slicker [facility]  Advanced Care Supervisor

## 2019-10-17 NOTE — ED Notes (Signed)
Pt denies CP/SOB at this time.

## 2019-10-17 NOTE — Evaluation (Signed)
Physical Therapy Evaluation Patient Details Name: Faith Parker MRN: [id number] DOB: 1930-04-18 Today's Date: 10/17/2019   History of Present Illness  85 year old female history of paroxysmal atrial fibrillation and pauses status post pacemaker, ascending aortic aneurysm, hypothyroidism, history of recurrent UTI presented to the emergency room overnight with worsening dyspnea on exertion for the last few weeks, in addition also reports chronic arthritis admitted back pain radiating to the right side.  Clinical Impression  Patient presents with decreased mobility due to deficits listed in PT problem list.  Currently minguard to S level for mobility and utilizing RW in hallway and at times no AD in her room.  Likely not far from her baseline, but as pt lives independently feel she will benefit from skilled PT in the acute setting to address generalized weakness and progress mobility for safe d/c home back to ILF.  Likely not to need follow up PT at d/c.     Follow Up Recommendations No PT follow up    Equipment Recommendations  None recommended by PT    Recommendations for Other Services       Precautions / Restrictions Precautions Precautions: Fall Restrictions Weight Bearing Restrictions: No      Mobility  Bed Mobility Overal bed mobility: Modified Independent             General bed mobility comments: used rail and reports has rail on bed at home  Transfers Overall transfer level: Needs assistance Equipment used: Rolling walker (2 wheeled) Transfers: Sit to/from Stand Sit to Stand: Min guard;Supervision         General transfer comment: for safety  Ambulation/Gait Ambulation/Gait assistance: Min Gaffer (Feet): 150 Feet Assistive device: Rolling walker (2 wheeled) Gait Pattern/deviations: Step-through pattern;Decreased stride length     General Gait Details: assist for safety; walked in room to bed from sink no AD with close  S  Stairs            Wheelchair Mobility    Modified Rankin (Stroke Patients Only)       Balance Overall balance assessment: Mild deficits observed, not formally tested                                           Pertinent Vitals/Pain Pain Assessment: Faces Faces Pain Scale: Hurts little more Pain Location: back Pain Descriptors / Indicators: Discomfort;Aching Pain Intervention(s): Monitored during session;Repositioned    Home Living Family/patient expects to be discharged to:: Private residence Living Arrangements: Alone Available Help at Discharge: Family;Available PRN/intermittently Type of Home: Independent living facility       Home Layout: One level Home Equipment: [place] - 4 wheels;Cane - single point      Prior Function Level of Independence: Independent with assistive device(s)         Comments: uses SPC or rollator walking in hallway, no device in her apartment     Hand Dominance        Extremity/Trunk Assessment   Upper Extremity Assessment Upper Extremity Assessment: RUE deficits/detail;LUE deficits/detail RUE Deficits / Details: overall WFL except grip decreased due to arthritis LUE Deficits / Details: overall WFL except grip decreased due to arthritis    Lower Extremity Assessment Lower Extremity Assessment: RLE deficits/detail RLE Deficits / Details: audible crepitus in knee due to arthritis, but strength WFL       Communication   Communication: HOH  Cognition Arousal/Alertness:  Awake/alert Behavior During Therapy: WFL for tasks assessed/performed Overall Cognitive Status: History of cognitive impairments - at baseline                                 General Comments: difficulty remembering which room even with cues, recalled location admittedly due to looking at my name tag and looking for the sink in the bathroom      General Comments General comments (skin integrity, edema, etc.): toileted  with S    Exercises     Assessment/Plan    PT Assessment Patient needs continued PT services  PT Problem List Decreased strength;Decreased mobility;Decreased safety awareness;Decreased knowledge of precautions;Decreased activity tolerance       PT Treatment Interventions DME instruction;Gait training;Functional mobility training;Therapeutic exercise;Patient/family education;Therapeutic activities;Balance training    PT Goals (Current goals can be found in the Care Plan section)  Acute Rehab PT Goals Patient Stated Goal: to return to independent living PT Goal Formulation: With patient/family Time For Goal Achievement: 10/31/19 Potential to Achieve Goals: Good    Frequency Min 3X/week   Barriers to discharge        Co-evaluation               AM-PAC PT "6 Clicks" Mobility  Outcome Measure Help needed turning from your back to your side while in a flat bed without using bedrails?: A Little Help needed moving from lying on your back to sitting on the side of a flat bed without using bedrails?: A Little Help needed moving to and from a bed to a chair (including a wheelchair)?: A Little Help needed standing up from a chair using your arms (e.g., wheelchair or bedside chair)?: A Little Help needed to walk in hospital room?: A Little Help needed climbing 3-5 steps with a railing? : A Little 6 Click Score: 18    End of Session   Activity Tolerance: Patient tolerated treatment well Patient left: in bed;with call bell/phone within reach;with family/visitor present   PT Visit Diagnosis: Muscle weakness (generalized) (M62.81)    Time: [id number] PT Time Calculation (min) (ACUTE ONLY): 25 min   Charges:   PT Evaluation $PT Eval Low Complexity: 1 Low PT Treatments $Gait Training: 8-22 mins        Magda Kiel, [place] [phone] 10/17/2019   Reginia Naas 10/17/2019, 4:42 PM

## 2019-10-17 NOTE — Progress Notes (Addendum)
Pt seen and examined, admitted earlier this morning by Dr. Hal Hope, please see his H&P for details briefly this is a 85 year old female history of paroxysmal atrial fibrillation and pauses status post pacemaker, ascending aortic aneurysm, hypothyroidism, history of recurrent UTI presented to the emergency room overnight with worsening dyspnea on exertion for the last few weeks, in addition also reports chronic arthritis admitted back pain radiating to the right side, she felt more dyspneic and diaphoretic last night and hence presented to the emergency room,. -In the ED she was noted to be afebrile initial EKG showed normal sinus rhythm, CT angiogram was negative for PE noted left upper lobe nodule and ascending aortic aneurysm, UA was abnormal, high-sensitivity troponin was negative, subsequently had a brief episode of A. fib RVR Exam with prominent AS murmur  Dyspnea on exertion -Reportedly ongoing for 1 to 2 weeks -Chest x-ray unremarkable, CT angiogram negative for PE, showed stable aortic aneurysm -No evidence of fluid overload at this time -Intermittent A. fib is a possibility, no ACS, ? Progression of Aortic stenosis or could perhaps be an anginal equivalent -Cardiology input requested by admitting MD -Check 2D echocardiogram, monitor on telemetry -PT eval, ambulate and assess for recurrence of symptoms  Paroxysmal atrial fibrillation, pauses, pacemaker -Brief RVR early this morning with heart rate in the 120s, now improved -Continue atenolol, Eliquis -May need to consider pacemaker interrogation  Chronic diastolic CHF -Follow-up repeat echo, echo in 2019 with preserved EF and grade 2 diastolic dysfunction  Suspected UTI -UA is abnormal with many bacteria, WBCs, patient denies overt symptoms at this time -Continue IV ceftriaxone, follow-up urine culture  Ascending aortic aneurysm -Stable  Lung nodule, left upper lobe -Consider follow-up, will defer to PCP given advanced  age  Domenic Polite, MD

## 2019-10-17 NOTE — ED Notes (Signed)
[employer] called for pacemaker interrogation, faxing over report

## 2019-10-17 NOTE — ED Notes (Signed)
Update given to pt daughter via telephone. Pt brief changed.

## 2019-10-17 NOTE — ED Notes (Signed)
Pt cleaned up and placed on purewick

## 2019-10-17 NOTE — ED Notes (Signed)
Tele   Breakfast ordered  

## 2019-10-17 NOTE — ED Notes (Signed)
ED TO INPATIENT HANDOFF REPORT  ED Nurse Name and Phone #: Jinny Blossom, #[phone]  S Name/Age/Gender Faith Parker 85 y.o. female Room/Bed: 018C/018C  Code Status   Code Status: Full Code  Home/SNF/Other Skilled nursing facility Patient oriented to: self, place, time and situation Is this baseline? Yes   Triage Complete: Triage complete  Chief Complaint [facility]  Triage Note Pt arrived by [facility] from dr office. Has sob with exertion for several days with right side lower back pain with diaphoresis.    Allergies Allergies  Allergen Reactions  . Ciprofloxacin Other (See Comments)    Unknown  . Latex Other (See Comments)    Unknown  . Levofloxacin Other (See Comments)    Unknown  . Macrobid [employer] Macro] Other (See Comments)    Unknown  . Nitrofurantoin Other (See Comments)    Unknown  . Sulfa Antibiotics Other (See Comments)    Unknown    Level of Care/Admitting Diagnosis ED Disposition    ED Disposition Condition Comment   Admit  Hospital Area: [facility] [[id number]]  Level of Care: Telemetry Medical [104]  I expect the patient will be discharged within 24 hours: No (not a candidate for 5C-Observation unit)  Covid Evaluation: Asymptomatic Screening Protocol (No Symptoms)  Diagnosis: DOE (dyspnea on exertion) [id number]  Admitting Physician: [name] [phone]  Attending Physician: [name] [email or web address]  PT Class (Do Not Modify): Observation [104]  PT Acc Code (Do Not Modify): Observation [10022]       B Medical/Surgery History Past Medical History:  Diagnosis Date  . Anxiety   . Aortic regurgitation   . Gastritis   . GERD (gastroesophageal reflux disease)   . H/O artificial eye lens   . Hypertension   . Hypothyroid   . Left ventricular hypertrophy   . Major depression   . Osteoarthritis   . Osteoporosis   . Peptic ulcer disease   . Stress incontinence    Past Surgical History:  Procedure Laterality Date  .  LEG SURGERY Right    multiple surgeries, unknown type  . PACEMAKER IMPLANT N/A 06/04/2018   Procedure: PACEMAKER IMPLANT;  Surgeon: Deboraha Sprang, MD;  Location: [facility];  Service: Cardiovascular;  Laterality: N/A;     A IV Location/Drains/Wounds Patient Lines/Drains/Airways Status   Active Line/Drains/Airways    Name:   Placement date:   Placement time:   Site:   Days:   Peripheral IV 10/16/19 Left Antecubital   10/16/19    1602    Antecubital   1   Incision (Closed) 06/04/18 Chest Left   06/04/18    2000     500          Intake/Output Last 24 hours No intake or output data in the 24 hours ending 10/17/19 0459  Labs/Imaging Results for orders placed or performed during the hospital encounter of 10/16/19 (from the past 48 hour(s))  Basic metabolic panel     Status: Abnormal   Collection Time: 10/16/19  4:05 PM  Result Value Ref Range   Sodium 139 135 - 145 mmol/L   Potassium 3.2 (L) 3.5 - 5.1 mmol/L   Chloride 105 98 - 111 mmol/L   CO2 21 (L) 22 - 32 mmol/L   Glucose, Bld 108 (H) 70 - 99 mg/dL   BUN 16 8 - 23 mg/dL   Creatinine, Ser 1.49 (H) 0.44 - 1.00 mg/dL   Calcium 9.2 8.9 - 10.3 mg/dL   GFR calc  non Af Amer 31 (L) >60 mL/min   GFR calc Af Amer 36 (L) >60 mL/min   Anion gap 13 5 - 15    Comment: Performed at [facility] [address] [place] [address]  CBC     Status: None   Collection Time: 10/16/19  4:05 PM  Result Value Ref Range   WBC 6.8 4.0 - 10.5 K/uL   RBC 4.29 3.87 - 5.11 MIL/uL   Hemoglobin 13.1 12.0 - 15.0 g/dL   HCT 40.5 36.0 - 46.0 %   MCV 94.4 80.0 - 100.0 fL   MCH 30.5 26.0 - 34.0 pg   MCHC 32.3 30.0 - 36.0 g/dL   RDW 13.6 11.5 - 15.5 %   Platelets 210 150 - 400 K/uL   nRBC 0.0 0.0 - 0.2 %    Comment: Performed at [facility], [place] [address] [place], [place] [address]  Troponin I (High Sensitivity)     Status: None   Collection Time: 10/16/19  4:05 PM  Result Value Ref Range   Troponin I (High Sensitivity)  9 <18 ng/L    Comment: (NOTE) Elevated high sensitivity troponin I (hsTnI) values and significant  changes across serial measurements may suggest ACS but many other  chronic and acute conditions are known to elevate hsTnI results.  Refer to the "Links" section for chest pain algorithms and additional  guidance. Performed at [facility], [place] [address], [place], [place] [address]   Urinalysis, Routine w reflex microscopic     Status: Abnormal   Collection Time: 10/16/19  7:23 PM  Result Value Ref Range   Color, Urine YELLOW YELLOW   APPearance CLOUDY (A) CLEAR   Specific Gravity, Urine 1.014 1.005 - 1.030   pH 6.0 5.0 - 8.0   Glucose, UA 50 (A) NEGATIVE mg/dL   Hgb urine dipstick SMALL (A) NEGATIVE   Bilirubin Urine NEGATIVE NEGATIVE   Ketones, ur 5 (A) NEGATIVE mg/dL   Protein, ur >=300 (A) NEGATIVE mg/dL   Nitrite NEGATIVE NEGATIVE   Leukocytes,Ua NEGATIVE NEGATIVE   RBC / HPF 6-10 0 - 5 RBC/hpf   WBC, UA 21-50 0 - 5 WBC/hpf   Bacteria, UA MANY (A) NONE SEEN   Squamous Epithelial / LPF 0-5 0 - 5   Mucus PRESENT    Granular Casts, UA PRESENT     Comment: Performed at [facility], [place] [address], [place], [place] [address]  Troponin I (High Sensitivity)     Status: None   Collection Time: 10/16/19 11:11 PM  Result Value Ref Range   Troponin I (High Sensitivity) 10 <18 ng/L    Comment: (NOTE) Elevated high sensitivity troponin I (hsTnI) values and significant  changes across serial measurements may suggest ACS but many other  chronic and acute conditions are known to elevate hsTnI results.  Refer to the "Links" section for chest pain algorithms and additional  guidance. Performed at [facility], [place] [address], [place],  [address]   TSH     Status: None   Collection Time: 10/17/19  3:25 AM  Result Value Ref Range   TSH 0.687 0.350 - 4.500 uIU/mL    Comment: Performed by a 3rd Generation assay with a functional sensitivity of <=0.01  uIU/mL. Performed at [facility], [place] [address] [place],  [address]   Magnesium     Status: None   Collection Time: 10/17/19  3:25 AM  Result  Value Ref Range   Magnesium 2.1 1.7 - 2.4 mg/dL    Comment: Performed at [facility], [place] [address] [place], [place] [address]  Comprehensive metabolic panel     Status: Abnormal   Collection Time: 10/17/19  3:25 AM  Result Value Ref Range   Sodium 142 135 - 145 mmol/L   Potassium 2.9 (L) 3.5 - 5.1 mmol/L   Chloride 106 98 - 111 mmol/L   CO2 23 22 - 32 mmol/L   Glucose, Bld 115 (H) 70 - 99 mg/dL   BUN 15 8 - 23 mg/dL   Creatinine, Ser 1.25 (H) 0.44 - 1.00 mg/dL   Calcium 8.9 8.9 - 10.3 mg/dL   Total Protein 6.4 (L) 6.5 - 8.1 g/dL   Albumin 3.7 3.5 - 5.0 g/dL   AST 21 15 - 41 U/L   ALT 20 0 - 44 U/L   Alkaline Phosphatase 52 38 - 126 U/L   Total Bilirubin 1.0 0.3 - 1.2 mg/dL   GFR calc non Af Amer 39 (L) >60 mL/min   GFR calc Af Amer 45 (L) >60 mL/min   Anion gap 13 5 - 15    Comment: Performed at [facility], [place] [address], [place], [place] [address]  CBC WITH DIFFERENTIAL     Status: None   Collection Time: 10/17/19  3:25 AM  Result Value Ref Range   WBC 7.0 4.0 - 10.5 K/uL   RBC 4.36 3.87 - 5.11 MIL/uL   Hemoglobin 13.5 12.0 - 15.0 g/dL   HCT 40.2 36.0 - 46.0 %   MCV 92.2 80.0 - 100.0 fL   MCH 31.0 26.0 - 34.0 pg   MCHC 33.6 30.0 - 36.0 g/dL   RDW 13.4 11.5 - 15.5 %   Platelets 194 150 - 400 K/uL   nRBC 0.0 0.0 - 0.2 %   Neutrophils Relative % 77 %   Neutro Abs 5.4 1.7 - 7.7 K/uL   Lymphocytes Relative 13 %   Lymphs Abs 0.9 0.7 - 4.0 K/uL   Monocytes Relative 9 %   Monocytes Absolute 0.6 0.1 - 1.0 K/uL   Eosinophils Relative 0 %   Eosinophils Absolute 0.0 0.0 - 0.5 K/uL   Basophils Relative 1 %   Basophils Absolute 0.0 0.0 - 0.1 K/uL   Immature Granulocytes 0 %   Abs Immature Granulocytes 0.02 0.00 - 0.07 K/uL    Comment: Performed at [facility], [address], [place],  [place] [address]  Troponin I (High Sensitivity)     Status: None   Collection Time: 10/17/19  3:25 AM  Result Value Ref Range   Troponin I (High Sensitivity) 17 <18 ng/L    Comment: (NOTE) Elevated high sensitivity troponin I (hsTnI) values and significant  changes across serial measurements may suggest ACS but many other  chronic and acute conditions are known to elevate hsTnI results.  Refer to the "Links" section for chest pain algorithms and additional  guidance. Performed at [facility], [place] [address], [place],  [address]    Dg Chest 2 View  Result Date: 10/16/2019 CLINICAL DATA:  Shortness of breath. EXAM: CHEST - 2 VIEW COMPARISON:  Slice second [id number] FINDINGS: There is a dual chamber left-sided pacemaker in place. The heart size is normal. Aortic calcifications are again noted. There is some mild blunting of the costophrenic angles which is similar to prior study. The lungs are hyperexpanded. There is no acute osseous abnormality. No  pneumothorax. A hiatal hernia is noted. There is a calcified granuloma in the left upper lobe. IMPRESSION: 1. No acute abnormality. 2. Moderate-sized hiatal hernia. Electronically Signed   By: Constance Holster M.D.   On: 10/16/2019 16:54   Ct Angio Chest Pe W And/or Wo Contrast  Result Date: 10/16/2019 CLINICAL DATA:  Shortness of breath, diaphoresis EXAM: CT ANGIOGRAPHY CHEST WITH CONTRAST TECHNIQUE: Multidetector CT imaging of the chest was performed using the standard protocol during bolus administration of intravenous contrast. Multiplanar CT image reconstructions and MIPs were obtained to evaluate the vascular anatomy. CONTRAST:  53mL OMNIPAQUE IOHEXOL 350 MG/ML SOLN COMPARISON:  Radiograph same day, CT chest February 22, 2018 he FINDINGS: Cardiovascular: There is a optimal opacification of the pulmonary arteries. There is no central,segmental, or subsegmental filling defects within the pulmonary arteries. There is mild cardiomegaly with left  ventricular hypertrophy. Aortic valve calcifications are noted. Again noted is aneurysmal dilatation of the ascending intrathoracic aorta measuring 4.1 cm which tapers at the level of the aortic arch. Scattered aortic atherosclerotic calcifications are seen. Mediastinum/Nodes: No hilar, mediastinal, or axillary adenopathy. There is a moderate hiatal hernia seen. Lungs/Pleura: There is a small ill-defined nodular opacity in the anterior left upper lobe measuring 2.4 cm, series 6, image 80. A small amount of streaky/nodular opacity seen at the right lung base. No other large airspace consolidation or pleural effusion. Upper Abdomen: No acute abnormalities present in the visualized portions of the upper abdomen. Musculoskeletal: No chest wall abnormality. No acute or significant osseous findings. A left-sided pacemaker seen with the lead tips right atrium right ventricle. Again noted are slight superior compression deformities of the T6 and T7 vertebral bodies as on prior exam. Review of the MIP images confirms the above findings. IMPRESSION: 1.  No central, segmental, or subsegmental pulmonary embolism. 2. Stable intrathoracic ascending aortic aneurysm measuring 4.1 cm in transverse dimension. 3. Ill-defined nodular opacity in the anterior left upper lobe measuring 2.4 cm which could represent a focus of atelectasis or early infectious etiology. Followup PA and lateral chest X-ray is recommended in 3-4 weeks following trial of antibiotic therapy to ensure resolution and exclude underlying malignancy. 4. Moderate hiatal hernia 5. Chronic superior compression deformities of the midthoracic spine. Electronically Signed   By: Prudencio Pair M.D.   On: 10/16/2019 23:42    Pending Labs Unresulted Labs (From admission, onward)    Start     Ordered   10/16/19 2352  SARS CORONAVIRUS 2 (TAT 6-24 HRS) Nasopharyngeal Nasopharyngeal Swab  (Symptomatic/High Risk of Exposure/Tier 1 Patients Labs with Precautions)  Once,   STAT     Question Answer Comment  Is this test for diagnosis or screening Diagnosis of ill patient   Symptomatic for COVID-19 as defined by CDC Yes   Date of Symptom Onset 10/13/2019   Hospitalized for COVID-19 No   Admitted to ICU for COVID-19 No   Previously tested for COVID-19 No   Resident in a congregate (group) care setting Yes   Employed in healthcare setting No   Pregnant No      10/16/19 2356   10/16/19 2215  Urine culture  ONCE - STAT,   STAT     10/16/19 2214          Vitals/Pain Today's Vitals   10/17/19 0215 10/17/19 0339 10/17/19 0352 10/17/19 0400  BP:   (!) 125/96   Pulse: (!) 109  (!) 122   Resp: 19  14   Temp:  TempSrc:      SpO2: 96%  96%   PainSc:  0-No pain  0-No pain    Isolation Precautions Airborne and Contact precautions  Medications Medications  sodium chloride flush (NS) 0.9 % injection 3 mL (3 mLs Intravenous Not Given 10/17/19 0359)  atenolol (TENORMIN) tablet 25 mg (has no administration in time range)  rosuvastatin (CRESTOR) tablet 20 mg (has no administration in time range)  ALPRAZolam (XANAX) tablet 0.5 mg (has no administration in time range)  FLUoxetine (PROZAC) tablet 20 mg (has no administration in time range)  levothyroxine (SYNTHROID) tablet 112 mcg (has no administration in time range)  apixaban (ELIQUIS) tablet 5 mg (5 mg Oral Given 10/17/19 0341)  vitamin B-12 (CYANOCOBALAMIN) tablet 100 mcg (has no administration in time range)  acetaminophen (TYLENOL) tablet 650 mg (has no administration in time range)    Or  acetaminophen (TYLENOL) suppository 650 mg (has no administration in time range)  ondansetron (ZOFRAN) tablet 4 mg (has no administration in time range)    Or  ondansetron (ZOFRAN) injection 4 mg (has no administration in time range)  cefTRIAXone (ROCEPHIN) 1 g in sodium chloride 0.9 % 100 mL IVPB (has no administration in time range)  iohexol (OMNIPAQUE) 350 MG/ML injection 75 mL (75 mLs Intravenous Contrast Given  10/16/19 2318)  cefTRIAXone (ROCEPHIN) 1 g in sodium chloride 0.9 % 100 mL IVPB (0 g Intravenous Stopped 10/17/19 0144)    Mobility walks with device     Focused Assessments     R Recommendations: See Admitting Provider Note  Report given to:   Additional Notes:

## 2019-10-18 ENCOUNTER — Inpatient Hospital Stay (HOSPITAL_COMMUNITY): Payer: Medicare Other

## 2019-10-18 LAB — BASIC METABOLIC PANEL
Anion gap: 11 (ref 5–15)
BUN: 14 mg/dL (ref 8–23)
CO2: 21 mmol/L — ABNORMAL LOW (ref 22–32)
Calcium: 8.6 mg/dL — ABNORMAL LOW (ref 8.9–10.3)
Chloride: 106 mmol/L (ref 98–111)
Creatinine, Ser: 1.07 mg/dL — ABNORMAL HIGH (ref 0.44–1.00)
GFR calc Af Amer: 54 mL/min — ABNORMAL LOW (ref >60)
GFR calc non Af Amer: 47 mL/min — ABNORMAL LOW (ref >60)
Glucose, Bld: 115 mg/dL — ABNORMAL HIGH (ref 70–99)
Potassium: 3.5 mmol/L (ref 3.5–5.1)
Sodium: 138 mmol/L (ref 135–145)

## 2019-10-18 LAB — CBC
HCT: 40 % (ref 36.0–46.0)
Hemoglobin: 13.4 g/dL (ref 12.0–15.0)
MCH: 30.8 pg (ref 26.0–34.0)
MCHC: 33.5 g/dL (ref 30.0–36.0)
MCV: 92 fL (ref 80.0–100.0)
Platelets: 196 10*3/uL (ref 150–400)
RBC: 4.35 MIL/uL (ref 3.87–5.11)
RDW: 13.3 % (ref 11.5–15.5)
WBC: 6.7 10*3/uL (ref 4.0–10.5)
nRBC: 0 % (ref 0.0–0.2)

## 2019-10-18 MED ORDER — AMLODIPINE BESYLATE 5 MG PO TABS
5.0000 mg | ORAL_TABLET | Freq: Every day | ORAL | Status: DC
  Administered 2019-10-19 – 2019-10-22 (×4): 5 mg via ORAL
  Filled 2019-10-18 (×5): qty 1

## 2019-10-18 NOTE — Progress Notes (Addendum)
PROGRESS NOTE    Faith Parker  [id number] DOB: [birth year] DOA: 10/16/2019 PCP: Leighton Ruff, MD  Brief Narrative: 85 year old female history of paroxysmal atrial fibrillation and pauses status post pacemaker, ascending aortic aneurysm, hypothyroidism, history of recurrent UTI presented to the emergency room overnight with worsening dyspnea on exertion for the last few weeks, in addition also reports chronic arthritis admitted back pain radiating to the right side, she felt more dyspneic and diaphoretic last night and hence presented to the emergency room,. -In the ED she was noted to be afebrile initial EKG showed normal sinus rhythm, CT angiogram was negative for PE noted left upper lobe nodule and ascending aortic aneurysm, UA was abnormal, high-sensitivity troponin was negative, subsequently had a brief episode of A. fib RVR   Assessment & Plan:   Dyspnea on exertion -Reportedly ongoing for 1 to 2 weeks -Chest x-ray unremarkable, CT angiogram negative for PE, showed stable aortic aneurysm -No evidence of fluid overload at this time -Intermittent A. fib is a possibility versus anginal equivalent -2D echocardiogram is unremarkable, only mild AS noted -Cardiology following, plan for stress test  Paroxysmal atrial fibrillation, pauses, pacemaker -Brief RVR yesterday morning with heart rate in the 120s, now improved -Continue atenolol, Eliquis  Chronic diastolic CHF -Preserved EF, diastolic dysfunction, appears euvolemic  Suspected UTI -UA is abnormal with many bacteria, WBCs, patient denies overt symptoms at this time -Continue IV ceftriaxone, urine culture with 100,000 colonies of gram-negative rods  Ascending aortic aneurysm -Stable  Lung nodule, left upper lobe -Consider follow-up, will defer to PCP given advanced age    DVT prophylaxis: Apixaban Code Status: Full code Family Communication: No family at bedside Disposition Plan: Home  Consultants:    Cardiology   Procedures:   Antimicrobials:    Subjective: -Feels okay, no events overnight, reports feeling the same  Objective: Vitals:   10/18/19 0014 10/18/19 0431 10/18/19 0813 10/18/19 1120  BP: (!) 154/90 (!) 193/83 (!) 176/77 (!) 166/69  Pulse: 70 61 61 61  Resp: 18 18 18 19   Temp: (!) 97.4 F (36.3 C) 98.4 F (36.9 C) 98.5 F (36.9 C) 98.5 F (36.9 C)  TempSrc: Oral Oral Oral Oral  SpO2: 97% 98% 97% 94%  Weight:  92.6 kg    Height:        Intake/Output Summary (Last 24 hours) at 10/18/2019 1431 Last data filed at 10/18/2019 0943 Gross per 24 hour  Intake 480 ml  Output 750 ml  Net -270 ml   Filed Weights   10/17/19 1427 10/18/19 0431  Weight: 89.4 kg 92.6 kg    Examination:  Gen: Awake, Alert, Oriented X 3, no distress HEENT: PERRLA, Neck supple, no JVD Lungs: Good air movement bilaterally, CTAB CVS: S1-S2, regular rate rhythm, systolic ejection murmur Abd: soft, Non tender, non distended, BS present Extremities: No edema Skin: no new rashes Psychiatry: Judgement and insight appear normal. Mood & affect appropriate.     Data Reviewed:   CBC: Recent Labs  Lab 10/16/19 1605 10/17/19 0325 10/18/19 0449  WBC 6.8 7.0 6.7  NEUTROABS  --  5.4  --   HGB 13.1 13.5 13.4  HCT 40.5 40.2 40.0  MCV 94.4 92.2 92.0  PLT 210 194 [id number]   Basic Metabolic Panel: Recent Labs  Lab 10/16/19 1605 10/17/19 0325 10/17/19 1038 10/18/19 0449  NA 139 142 139 138  K 3.2* 2.9* 3.6 3.5  CL 105 106 108 106  CO2 21* 23 22 21*  GLUCOSE 108* 115* 122*  115*  BUN 16 15 13 14   CREATININE 1.49* 1.25* 1.20* 1.07*  CALCIUM 9.2 8.9 8.4* 8.6*  MG  --  2.1  --   --    GFR: Estimated Creatinine Clearance: 44.1 mL/min (A) (by C-G formula based on SCr of 1.07 mg/dL (H)). Liver Function Tests: Recent Labs  Lab 10/17/19 0325  AST 21  ALT 20  ALKPHOS 52  BILITOT 1.0  PROT 6.4*  ALBUMIN 3.7   Recent Labs  Lab 10/17/19 0557  LIPASE 35   No results for  input(s): AMMONIA in the last 168 hours. Coagulation Profile: No results for input(s): INR, PROTIME in the last 168 hours. Cardiac Enzymes: No results for input(s): CKTOTAL, CKMB, CKMBINDEX, TROPONINI in the last 168 hours. BNP (last 3 results) No results for input(s): PROBNP in the last 8760 hours. HbA1C: No results for input(s): HGBA1C in the last 72 hours. CBG: No results for input(s): GLUCAP in the last 168 hours. Lipid Profile: No results for input(s): CHOL, HDL, LDLCALC, TRIG, CHOLHDL, LDLDIRECT in the last 72 hours. Thyroid Function Tests: Recent Labs    10/17/19 0325  TSH 0.687   Anemia Panel: No results for input(s): VITAMINB12, FOLATE, FERRITIN, TIBC, IRON, RETICCTPCT in the last 72 hours. Urine analysis:    Component Value Date/Time   COLORURINE YELLOW 10/16/2019 1923   APPEARANCEUR CLOUDY (A) 10/16/2019 1923   LABSPEC 1.014 10/16/2019 1923   PHURINE 6.0 10/16/2019 1923   GLUCOSEU 50 (A) 10/16/2019 1923   HGBUR SMALL (A) 10/16/2019 [place] NEGATIVE 10/16/2019 1923   KETONESUR 5 (A) 10/16/2019 1923   PROTEINUR >=300 (A) 10/16/2019 1923   UROBILINOGEN 0.2 06/30/2016 1149   NITRITE NEGATIVE 10/16/2019 1923   LEUKOCYTESUR NEGATIVE 10/16/2019 1923   Sepsis Labs: @LABRCNTIP (procalcitonin:4,lacticidven:4)  ) Recent Results (from the past 240 hour(s))  Urine culture     Status: Abnormal (Preliminary result)   Collection Time: 10/16/19 10:15 PM   Specimen: Urine, Catheterized  Result Value Ref Range Status   Specimen Description URINE, CATHETERIZED  Final   Special Requests   Final    NONE Performed at [facility], [address], [place],  [address]    Culture >=100,000 COLONIES/mL GRAM NEGATIVE RODS (A)  Final   Report Status PENDING  Incomplete  SARS CORONAVIRUS 2 (TAT 6-24 HRS) Nasopharyngeal Nasopharyngeal Swab     Status: None   Collection Time: 10/17/19 12:31 AM   Specimen: Nasopharyngeal Swab  Result Value Ref Range Status    SARS Coronavirus 2 NEGATIVE NEGATIVE Final    Comment: (NOTE) SARS-CoV-2 target nucleic acids are NOT DETECTED. The SARS-CoV-2 RNA is generally detectable in upper and lower respiratory specimens during the acute phase of infection. Negative results do not preclude SARS-CoV-2 infection, do not rule out co-infections with other pathogens, and should not be used as the sole basis for treatment or other patient management decisions. Negative results must be combined with clinical observations, patient history, and epidemiological information. The expected result is Negative. Fact Sheet for Patients: SugarRoll.be Fact Sheet for Healthcare Providers: https://www.woods-mathews.com/ This test is not yet approved or cleared by the [place] FDA and  has been authorized for detection and/or diagnosis of SARS-CoV-2 by FDA under an Emergency Use Authorization (EUA). This EUA will remain  in effect (meaning this test can be used) for the duration of the COVID-19 declaration under Section 56 4(b)(1) of the Act, 21 U.S.C. section 360bbb-3(b)(1), unless the authorization is terminated or revoked sooner. Performed at [facility]  [facility], [place] [address], [place], [place] [address]          Radiology Studies: Dg Chest 2 View  Result Date: 10/16/2019 CLINICAL DATA:  Shortness of breath. EXAM: CHEST - 2 VIEW COMPARISON:  Slice second [id number] FINDINGS: There is a dual chamber left-sided pacemaker in place. The heart size is normal. Aortic calcifications are again noted. There is some mild blunting of the costophrenic angles which is similar to prior study. The lungs are hyperexpanded. There is no acute osseous abnormality. No pneumothorax. A hiatal hernia is noted. There is a calcified granuloma in the left upper lobe. IMPRESSION: 1. No acute abnormality. 2. Moderate-sized hiatal hernia. Electronically Signed   By: Constance Holster M.D.   On: 10/16/2019 16:54    Ct Angio Chest Pe W And/or Wo Contrast  Result Date: 10/16/2019 CLINICAL DATA:  Shortness of breath, diaphoresis EXAM: CT ANGIOGRAPHY CHEST WITH CONTRAST TECHNIQUE: Multidetector CT imaging of the chest was performed using the standard protocol during bolus administration of intravenous contrast. Multiplanar CT image reconstructions and MIPs were obtained to evaluate the vascular anatomy. CONTRAST:  53mL OMNIPAQUE IOHEXOL 350 MG/ML SOLN COMPARISON:  Radiograph same day, CT chest February 22, 2018 he FINDINGS: Cardiovascular: There is a optimal opacification of the pulmonary arteries. There is no central,segmental, or subsegmental filling defects within the pulmonary arteries. There is mild cardiomegaly with left ventricular hypertrophy. Aortic valve calcifications are noted. Again noted is aneurysmal dilatation of the ascending intrathoracic aorta measuring 4.1 cm which tapers at the level of the aortic arch. Scattered aortic atherosclerotic calcifications are seen. Mediastinum/Nodes: No hilar, mediastinal, or axillary adenopathy. There is a moderate hiatal hernia seen. Lungs/Pleura: There is a small ill-defined nodular opacity in the anterior left upper lobe measuring 2.4 cm, series 6, image 80. A small amount of streaky/nodular opacity seen at the right lung base. No other large airspace consolidation or pleural effusion. Upper Abdomen: No acute abnormalities present in the visualized portions of the upper abdomen. Musculoskeletal: No chest wall abnormality. No acute or significant osseous findings. A left-sided pacemaker seen with the lead tips right atrium right ventricle. Again noted are slight superior compression deformities of the T6 and T7 vertebral bodies as on prior exam. Review of the MIP images confirms the above findings. IMPRESSION: 1.  No central, segmental, or subsegmental pulmonary embolism. 2. Stable intrathoracic ascending aortic aneurysm measuring 4.1 cm in transverse dimension. 3.  Ill-defined nodular opacity in the anterior left upper lobe measuring 2.4 cm which could represent a focus of atelectasis or early infectious etiology. Followup PA and lateral chest X-ray is recommended in 3-4 weeks following trial of antibiotic therapy to ensure resolution and exclude underlying malignancy. 4. Moderate hiatal hernia 5. Chronic superior compression deformities of the midthoracic spine. Electronically Signed   By: Prudencio Pair M.D.   On: 10/16/2019 23:42        Scheduled Meds:  amLODipine  5 mg Oral Daily   apixaban  5 mg Oral BID   atenolol  25 mg Oral Daily   FLUoxetine  20 mg Oral Daily   levothyroxine  112 mcg Oral Q0600   potassium chloride  40 mEq Oral BID   rosuvastatin  20 mg Oral Daily   sodium chloride flush  3 mL Intravenous Once   vitamin B-12  100 mcg Oral Daily   Continuous Infusions:  cefTRIAXone (ROCEPHIN)  IV Stopped (10/18/19 0745)     LOS: 1 day    Time spent: 54min  Domenic Polite, MD [facility] Page via [email or web address], password [email or web address] After 7PM please contact night-coverage  10/18/2019, 2:31 PM

## 2019-10-18 NOTE — Progress Notes (Addendum)
Progress Note  Patient Name: Faith Parker Date of Encounter: 10/18/2019  Primary Cardiologist: Dorris Carnes, MD  Subjective   I seen she feels better this morning and denies any shortness of breath.  Inpatient Medications    Scheduled Meds: . apixaban  5 mg Oral BID  . atenolol  25 mg Oral Daily  . FLUoxetine  20 mg Oral Daily  . levothyroxine  112 mcg Oral Q0600  . potassium chloride  40 mEq Oral BID  . rosuvastatin  20 mg Oral Daily  . sodium chloride flush  3 mL Intravenous Once  . vitamin B-12  100 mcg Oral Daily   Continuous Infusions: . cefTRIAXone (ROCEPHIN)  IV Stopped (10/18/19 0745)   PRN Meds: acetaminophen **OR** acetaminophen, ALPRAZolam, labetalol, ondansetron **OR** ondansetron (ZOFRAN) IV   Vital Signs    Vitals:   10/17/19 1933 10/18/19 0014 10/18/19 0431 10/18/19 0813  BP: (!) 164/75 (!) 154/90 (!) 193/83 (!) 176/77  Pulse: 77 70 61 61  Resp: 16 18 18 18   Temp: (!) 97.5 F (36.4 C) (!) 97.4 F (36.3 C) 98.4 F (36.9 C) 98.5 F (36.9 C)  TempSrc: Oral Oral Oral Oral  SpO2: 97% 97% 98% 97%  Weight:   92.6 kg   Height:        Intake/Output Summary (Last 24 hours) at 10/18/2019 1111 Last data filed at 10/18/2019 0943 Gross per 24 hour  Intake 480 ml  Output 750 ml  Net -270 ml   Last 3 Weights 10/18/2019 10/17/2019 09/11/2018  Weight (lbs) 204 lb 1.6 oz 197 lb 217 lb 12.8 oz  Weight (kg) 92.579 kg 89.359 kg 98.793 kg      Telemetry    A paced rhythm- Personally Reviewed  ECG    No new tracing- Personally Reviewed  Physical Exam  Not in acute distress GEN: No acute distress.   Neck: No JVD Cardiac: RRR, 3 out of 6 systolic, mild diastolic murmurs, rubs, or gallops.  Respiratory: Clear to auscultation bilaterally. GI: Soft, nontender, non-distended  MS: No edema; No deformity. Neuro:  Nonfocal  Psych: Normal affect   Labs    High Sensitivity Troponin:   Recent Labs  Lab 10/16/19 1605 10/16/19 2311 10/17/19 0325  10/17/19 0557  TROPONINIHS 9 10 17  24*     Chemistry Recent Labs  Lab 10/17/19 0325 10/17/19 1038 10/18/19 0449  NA 142 139 138  K 2.9* 3.6 3.5  CL 106 108 106  CO2 23 22 21*  GLUCOSE 115* 122* 115*  BUN 15 13 14   CREATININE 1.25* 1.20* 1.07*  CALCIUM 8.9 8.4* 8.6*  PROT 6.4*  --   --   ALBUMIN 3.7  --   --   AST 21  --   --   ALT 20  --   --   ALKPHOS 52  --   --   BILITOT 1.0  --   --   GFRNONAA 39* 41* 47*  GFRAA 45* 47* 54*  ANIONGAP 13 9 11      Hematology Recent Labs  Lab 10/16/19 1605 10/17/19 0325 10/18/19 0449  WBC 6.8 7.0 6.7  RBC 4.29 4.36 4.35  HGB 13.1 13.5 13.4  HCT 40.5 40.2 40.0  MCV 94.4 92.2 92.0  MCH 30.5 31.0 30.8  MCHC 32.3 33.6 33.5  RDW 13.6 13.4 13.3  PLT 210 194 196   BNP Recent Labs  Lab 10/17/19 0325  BNP 189.0*    DDimer No results for input(s): DDIMER in the last  168 hours.   Radiology    Dg Chest 2 View  Result Date: 10/16/2019 CLINICAL DATA:  Shortness of breath. EXAM: CHEST - 2 VIEW COMPARISON:  Slice second [id number] FINDINGS: There is a dual chamber left-sided pacemaker in place. The heart size is normal. Aortic calcifications are again noted. There is some mild blunting of the costophrenic angles which is similar to prior study. The lungs are hyperexpanded. There is no acute osseous abnormality. No pneumothorax. A hiatal hernia is noted. There is a calcified granuloma in the left upper lobe. IMPRESSION: 1. No acute abnormality. 2. Moderate-sized hiatal hernia. Electronically Signed   By: Constance Holster M.D.   On: 10/16/2019 16:54   Ct Angio Chest Pe W And/or Wo Contrast  Result Date: 10/16/2019 CLINICAL DATA:  Shortness of breath, diaphoresis EXAM: CT ANGIOGRAPHY CHEST WITH CONTRAST TECHNIQUE: Multidetector CT imaging of the chest was performed using the standard protocol during bolus administration of intravenous contrast. Multiplanar CT image reconstructions and MIPs were obtained to evaluate the vascular anatomy.  CONTRAST:  56mL OMNIPAQUE IOHEXOL 350 MG/ML SOLN COMPARISON:  Radiograph same day, CT chest February 22, 2018 he FINDINGS: Cardiovascular: There is a optimal opacification of the pulmonary arteries. There is no central,segmental, or subsegmental filling defects within the pulmonary arteries. There is mild cardiomegaly with left ventricular hypertrophy. Aortic valve calcifications are noted. Again noted is aneurysmal dilatation of the ascending intrathoracic aorta measuring 4.1 cm which tapers at the level of the aortic arch. Scattered aortic atherosclerotic calcifications are seen. Mediastinum/Nodes: No hilar, mediastinal, or axillary adenopathy. There is a moderate hiatal hernia seen. Lungs/Pleura: There is a small ill-defined nodular opacity in the anterior left upper lobe measuring 2.4 cm, series 6, image 80. A small amount of streaky/nodular opacity seen at the right lung base. No other large airspace consolidation or pleural effusion. Upper Abdomen: No acute abnormalities present in the visualized portions of the upper abdomen. Musculoskeletal: No chest wall abnormality. No acute or significant osseous findings. A left-sided pacemaker seen with the lead tips right atrium right ventricle. Again noted are slight superior compression deformities of the T6 and T7 vertebral bodies as on prior exam. Review of the MIP images confirms the above findings. IMPRESSION: 1.  No central, segmental, or subsegmental pulmonary embolism. 2. Stable intrathoracic ascending aortic aneurysm measuring 4.1 cm in transverse dimension. 3. Ill-defined nodular opacity in the anterior left upper lobe measuring 2.4 cm which could represent a focus of atelectasis or early infectious etiology. Followup PA and lateral chest X-ray is recommended in 3-4 weeks following trial of antibiotic therapy to ensure resolution and exclude underlying malignancy. 4. Moderate hiatal hernia 5. Chronic superior compression deformities of the midthoracic spine.  Electronically Signed   By: Prudencio Pair M.D.   On: 10/16/2019 23:42    Cardiac Studies   TTE: 10/17/2019  1. Left ventricular ejection fraction, by visual estimation, is 55 to 60%. The left ventricle has normal function. There is mildly increased left ventricular hypertrophy.  2. Mild aortic stenosis is present. Vmax 2.3 cm/s, mean gradient 11 mmHG, AVA 1.86 cm2. No change from prior study (06/04/2018).  3. Small left ventricular internal cavity size.  4. The left ventricle has no regional wall motion abnormalities.  5. Global right ventricle has normal systolic function.The right ventricular size is normal. No increase in right ventricular wall thickness.  6. Left atrial size was mildly dilated.  7. Right atrial size was normal.  8. Presence of pericardial fat pad.  9. The mitral  valve is degenerative. Mild mitral valve regurgitation. 10. The tricuspid valve is grossly normal. Tricuspid valve regurgitation is trivial. 11. The aortic valve has an indeterminant number of cusps. Aortic valve regurgitation is mild. Mild aortic valve stenosis. 12. There is Severe calcifcation of the aortic valve. 13. There is Severely thickening of the aortic valve. 14. The pulmonic valve was grossly normal. Pulmonic valve regurgitation is not visualized. 15. Mild plaque invoving the ascending aorta. 16. Normal pulmonary artery systolic pressure. 17. The tricuspid regurgitant velocity is 2.34 m/s, and with an assumed right atrial pressure of 3 mmHg, the estimated right ventricular systolic pressure is normal at 24.9 mmHg. 18. A pacer wire is visualized in the RA and RV. 19. The inferior vena cava is normal in size with greater than 50% respiratory variability, suggesting right atrial pressure of 3 mmHg.  Patient Profile     85 y.o. female with a hx of Paroxysmal Afib, pauses s/p pacemaker, HTN, Aortic stenosis, ascending aortic aneurysm (4.1 cm 02/22/2018), hypothyroidism, and chronic UTI who is being seen  today for the evaluation of exertional dyspnea.  She is a patient of Dr. Harrington Challenger and Dr. Caryl Comes, last seen in October 2019.  She has history of paroxysmal A. fib with significant pauses status post pacemaker placement, she has noted mild aortic stenosis and regurgitation based on echo in 2018 with hyperdynamic LVEF.  Her shortness of breath is exertional and started about a week ago, no associated chest pain orthopnea proximal nocturnal dyspnea.  Her pacemaker was interrogated a week ago and showed A. fib burden of 5%.    Assessment & Plan    Exertional Dyspnea Patient reported DOE for 1 week. Unsure etiology at this point. CXR with no acute disease. CT chest negative for PE and stable aortic aneurysm.  Her troponins are negative.  I have reviewed her chest CTA for pulmonary embolism and it shows mild coronary calcifications and RCA and circumflex artery. We will proceed with nuclear stress test today and if normal we will discharge and blame this on hypertensive urgency.  Paroxysmal Afib On arrival patient was in NSR>> went into Afib RVR, rates to the 120s. Rates now down to the 60s. Was on atenolol 25 mg daily for rate control  - It looks like patient in in NSR during my exam. Will check on EKG - continue BB for rate control  - CHADSVASC = 4 (female, age, CHF, HTN) - Continue Eliquis - TSH 0.687, potassium 3.6>> supplement, magnesium 2.1 - Continue Telemetry  Chronic Diastolic HF - EF [id number] with preserved EF and G2DD - will recheck echo - BNP 189 - Appears euvolemic  Pauses s/p pacemaker - [employer] device - last interrogation 09/10/19 showing AT/AF 5.3% - Will discuss with MD whether she wants another interogation  UTI/lower back pain - abx per IM  Mild Aortic Stenosis/AI -Stable on echo yesterday  Ascending aortic aneurysm -Stable at 41 mm  HTN - Pressures improving - continue BB. ARB on hold for AKI - start amlodipine 5 mg po daily  AKI - creatinine 1.49 on  admission. baseline 0.86, today back to 1.07 - creatinine improving. 1.2 today - ARB held  For questions or updates, please contact Anton Ruiz Please consult [email or web address] for contact info under     Signed, Ena Dawley, MD  10/18/2019, 11:11 AM

## 2019-10-18 NOTE — Evaluation (Signed)
Occupational Therapy Evaluation Patient Details Name: Faith Parker MRN: [id number] DOB: 02-Jul-1930 Today's Date: 10/18/2019    History of Present Illness 85 year old female history of paroxysmal atrial fibrillation and pauses status post pacemaker, ascending aortic aneurysm, hypothyroidism, history of recurrent UTI presented to the emergency room overnight with worsening dyspnea on exertion for the last few weeks, in addition also reports chronic arthritis admitted back pain radiating to the right side.   Clinical Impression   Pt was ambulating with RW, but otherwise independent in ADL. She reports struggling with reaching her feet due to chronic back pain. Began educating pt in compensatory strategies and recommended use of AE, pt is willing to consider. Will follow acutely.    Follow Up Recommendations  No OT follow up    Equipment Recommendations  None recommended by OT    Recommendations for Other Services       Precautions / Restrictions Precautions Precautions: Fall Restrictions Weight Bearing Restrictions: No      Mobility Bed Mobility Overal bed mobility: Modified Independent             General bed mobility comments: HOB up 30 degrees  Transfers Overall transfer level: Needs assistance Equipment used: Rolling walker (2 wheeled) Transfers: Sit to/from Stand Sit to Stand: Supervision         General transfer comment: for safety    Balance Overall balance assessment: Mild deficits observed, not formally tested                                         ADL either performed or assessed with clinical judgement   ADL Overall ADL's : Needs assistance/impaired Eating/Feeding: Independent   Grooming: Supervision/safety;Standing   Upper Body Bathing: Set up;Sitting   Lower Body Bathing: Supervison/ safety;Sit to/from stand   Upper Body Dressing : Set up;Sitting   Lower Body Dressing: Supervision/safety;Sit to/from stand   Toilet  Transfer: Supervision/safety;Ambulation;RW   Toileting- Clothing Manipulation and Hygiene: Supervision/safety;Sit to/from stand       Functional mobility during ADLs: Supervision/safety;Rolling walker General ADL Comments: Pt with chronic back pain and is tall. She would benefit from AE and is willing to have education.      Vision Patient Visual Report: No change from baseline       Perception     Praxis      Pertinent Vitals/Pain Pain Assessment: Faces Faces Pain Scale: Hurts little more Pain Location: back--chronic Pain Descriptors / Indicators: Discomfort;Aching Pain Intervention(s): Monitored during session;Repositioned     Hand Dominance Right   Extremity/Trunk Assessment Upper Extremity Assessment Upper Extremity Assessment: Overall WFL for tasks assessed(arthritic changes in hands)   Lower Extremity Assessment Lower Extremity Assessment: Defer to PT evaluation   Cervical / Trunk Assessment Cervical / Trunk Assessment: Other exceptions Cervical / Trunk Exceptions: chronic back pain   Communication Communication Communication: HOH   Cognition Arousal/Alertness: Awake/alert Behavior During Therapy: WFL for tasks assessed/performed Overall Cognitive Status: Within Functional Limits for tasks assessed                                     General Comments       Exercises     Shoulder Instructions      Home Living Family/patient expects to be discharged to:: Private residence Living Arrangements: Alone Available Help at Discharge: Family;Available  PRN/intermittently Type of Home: Independent living facility([facility])       Home Layout: One level     Bathroom Shower/Tub: Walk-in Corporate treasurer Toilet: Handicapped height     Home Equipment: Environmental consultant - 4 wheels;Cane - single point;Grab bars - tub/shower;Grab bars - toilet;Hand held shower head          Prior Functioning/Environment Level of Independence: Independent with  assistive device(s)        Comments: uses SPC or rollator walking in hallway, no device in her apartment        OT Problem List: Impaired balance (sitting and/or standing);Decreased knowledge of use of DME or AE;Pain      OT Treatment/Interventions: Self-care/ADL training;DME and/or AE instruction;Patient/family education    OT Goals(Current goals can be found in the care plan section) Acute Rehab OT Goals Patient Stated Goal: to return to independent living OT Goal Formulation: With patient Time For Goal Achievement: 11/01/19 Potential to Achieve Goals: Good ADL Goals Additional ADL Goal #1: Pt will be knowledgeable in use of AE for LB ADL. Additional ADL Goal #2: Pt will perform ADL modified independently in preparation for return home to ILF.  OT Frequency: Min 2X/week   Barriers to D/C:            Co-evaluation              AM-PAC OT "6 Clicks" Daily Activity     Outcome Measure Help from another person eating meals?: None Help from another person taking care of personal grooming?: A Little Help from another person toileting, which includes using toliet, bedpan, or urinal?: A Little Help from another person bathing (including washing, rinsing, drying)?: A Little Help from another person to put on and taking off regular upper body clothing?: None Help from another person to put on and taking off regular lower body clothing?: A Little 6 Click Score: 20   End of Session Equipment Utilized During Treatment: Rolling walker  Activity Tolerance: Patient tolerated treatment well Patient left: in bed;with family/visitor present  OT Visit Diagnosis: Pain;Other abnormalities of gait and mobility (R26.89)                Time: [id number] OT Time Calculation (min): 25 min Charges:  OT General Charges $OT Visit: 1 Visit OT Evaluation $OT Eval Moderate Complexity: 1 Mod OT Treatments $Self Care/Home Management : 8-22 mins  Nestor Lewandowsky, OTR/L Acute Rehabilitation  Services Pager: [phone] Office: [phone]  Malka So 10/18/2019, 1:16 PM

## 2019-10-19 ENCOUNTER — Inpatient Hospital Stay (HOSPITAL_COMMUNITY): Payer: Medicare Other

## 2019-10-19 DIAGNOSIS — R0600 Dyspnea, unspecified: Secondary | ICD-10-CM

## 2019-10-19 DIAGNOSIS — I10 Essential (primary) hypertension: Secondary | ICD-10-CM

## 2019-10-19 LAB — NM MYOCAR MULTI W/SPECT W/WALL MOTION / EF
Peak HR: 72 {beats}/min
Rest HR: 60 {beats}/min
TID: 1.67

## 2019-10-19 LAB — URINE CULTURE: Culture: >=100000 — AB

## 2019-10-19 MED ORDER — REGADENOSON 0.4 MG/5ML IV SOLN
INTRAVENOUS | Status: AC
  Administered 2019-10-19: 10:00:00 0.4 mg
  Filled 2019-10-19: qty 5

## 2019-10-19 MED ORDER — TECHNETIUM TC 99M TETROFOSMIN IV KIT
10.0000 | PACK | Freq: Once | INTRAVENOUS | Status: AC | PRN
  Administered 2019-10-19: 10 via INTRAVENOUS

## 2019-10-19 MED ORDER — REGADENOSON 0.4 MG/5ML IV SOLN
0.4000 mg | Freq: Once | INTRAVENOUS | Status: AC
  Filled 2019-10-19: qty 5

## 2019-10-19 NOTE — Progress Notes (Signed)
PROGRESS NOTE    Faith Parker  [id number] DOB: 1930-04-03 DOA: 10/16/2019 PCP: Leighton Ruff, MD  Brief Narrative: 85 year old female history of paroxysmal atrial fibrillation and pauses status post pacemaker, ascending aortic aneurysm, hypothyroidism, history of recurrent UTI presented to the emergency room overnight with worsening dyspnea on exertion for the last few weeks, in addition also reports chronic arthritis admitted back pain radiating to the right side, she felt more dyspneic and diaphoretic last night and hence presented to the emergency room,. -In the ED she was noted to be afebrile initial EKG showed normal sinus rhythm, CT angiogram was negative for PE noted left upper lobe nodule and ascending aortic aneurysm, UA was abnormal, high-sensitivity troponin was negative, subsequently had a brief episode of A. fib RVR   Assessment & Plan:   Dyspnea on exertion -Reportedly ongoing for 1 to 2 weeks -Chest x-ray unremarkable, CT angiogram negative for PE, showed stable aortic aneurysm -No evidence of fluid overload at this time -2D echocardiogram is unremarkable, only mild AS noted -Cardiology following, underwent stress test today which is abnormal, concerning for inducible ischemia in multiple territories, this is an intermediate risk study will likely need left heart catheterization  Paroxysmal atrial fibrillation, pauses, pacemaker -Brief RVR on admission, heart rate controlled since -Continue atenolol, Eliquis  Chronic diastolic CHF -Preserved EF, diastolic dysfunction, appears euvolemic  Possible Enterobacter UTI -No overt symptoms reported, day 2 of ceftriaxone now, urine culture with Enterobacter which is intermediate sensitive to ceftriaxone and patient has multiple allergies noted, will continue ceftriaxone for 1 more day and then stop all antibiotics  Ascending aortic aneurysm -Stable  Lung nodule, left upper lobe -Consider follow-up, will defer to  PCP given advanced age  DVT prophylaxis: Apixaban Code Status: Full code Family Communication: No family at bedside, called and discussed with daughter Disposition Plan: Home  Consultants:   Cardiology   Procedures:   Antimicrobials:    Subjective: -Is okay, anxious to go home  Objective: Vitals:   10/19/19 0958 10/19/19 1000 10/19/19 1003 10/19/19 1455  BP: (!) 157/104 (!) 152/99 (!) 162/79 (!) 172/77  Pulse:    62  Resp:    20  Temp:    98 F (36.7 C)  TempSrc:    Oral  SpO2:    98%  Weight:      Height:        Intake/Output Summary (Last 24 hours) at 10/19/2019 1524 Last data filed at 10/19/2019 1400 Gross per 24 hour  Intake 240 ml  Output 1350 ml  Net -1110 ml   Filed Weights   10/17/19 1427 10/18/19 0431 10/19/19 0608  Weight: 89.4 kg 92.6 kg 90.9 kg    Examination:  Gen: Awake, Alert, Oriented X 3, no distress HEENT: PERRLA, Neck supple, no JVD Lungs: Good air movement bilaterally, CTAB CVS: RRR, systolic ejection murmur Abd: soft, Non tender, non distended, BS present Extremities: No edema Skin: no new rashes Psychiatry: Judgement and insight appear normal. Mood & affect appropriate.     Data Reviewed:   CBC: Recent Labs  Lab 10/16/19 1605 10/17/19 0325 10/18/19 0449  WBC 6.8 7.0 6.7  NEUTROABS  --  5.4  --   HGB 13.1 13.5 13.4  HCT 40.5 40.2 40.0  MCV 94.4 92.2 92.0  PLT 210 194 [id number]   Basic Metabolic Panel: Recent Labs  Lab 10/16/19 1605 10/17/19 0325 10/17/19 1038 10/18/19 0449  NA 139 142 139 138  K 3.2* 2.9* 3.6 3.5  CL 105 106 108  106  CO2 21* 23 22 21*  GLUCOSE 108* 115* 122* 115*  BUN 16 15 13 14   CREATININE 1.49* 1.25* 1.20* 1.07*  CALCIUM 9.2 8.9 8.4* 8.6*  MG  --  2.1  --   --    GFR: Estimated Creatinine Clearance: 43.7 mL/min (A) (by C-G formula based on SCr of 1.07 mg/dL (H)). Liver Function Tests: Recent Labs  Lab 10/17/19 0325  AST 21  ALT 20  ALKPHOS 52  BILITOT 1.0  PROT 6.4*  ALBUMIN 3.7    Recent Labs  Lab 10/17/19 0557  LIPASE 35   No results for input(s): AMMONIA in the last 168 hours. Coagulation Profile: No results for input(s): INR, PROTIME in the last 168 hours. Cardiac Enzymes: No results for input(s): CKTOTAL, CKMB, CKMBINDEX, TROPONINI in the last 168 hours. BNP (last 3 results) No results for input(s): PROBNP in the last 8760 hours. HbA1C: No results for input(s): HGBA1C in the last 72 hours. CBG: No results for input(s): GLUCAP in the last 168 hours. Lipid Profile: No results for input(s): CHOL, HDL, LDLCALC, TRIG, CHOLHDL, LDLDIRECT in the last 72 hours. Thyroid Function Tests: Recent Labs    10/17/19 0325  TSH 0.687   Anemia Panel: No results for input(s): VITAMINB12, FOLATE, FERRITIN, TIBC, IRON, RETICCTPCT in the last 72 hours. Urine analysis:    Component Value Date/Time   COLORURINE YELLOW 10/16/2019 1923   APPEARANCEUR CLOUDY (A) 10/16/2019 1923   LABSPEC 1.014 10/16/2019 1923   PHURINE 6.0 10/16/2019 1923   GLUCOSEU 50 (A) 10/16/2019 1923   HGBUR SMALL (A) 10/16/2019 1923   BILIRUBINUR NEGATIVE 10/16/2019 1923   KETONESUR 5 (A) 10/16/2019 1923   PROTEINUR >=300 (A) 10/16/2019 1923   UROBILINOGEN 0.2 06/30/2016 1149   NITRITE NEGATIVE 10/16/2019 1923   LEUKOCYTESUR NEGATIVE 10/16/2019 1923   Sepsis Labs: @LABRCNTIP (procalcitonin:4,lacticidven:4)  ) Recent Results (from the past 240 hour(s))  Urine culture     Status: Abnormal   Collection Time: 10/16/19 10:15 PM   Specimen: Urine, Catheterized  Result Value Ref Range Status   Specimen Description URINE, CATHETERIZED  Final   Special Requests   Final    NONE Performed at [facility], [place] [address] [place], [place] [address]    Culture >=100,000 COLONIES/mL ENTEROBACTER CLOACAE (A)  Final   Report Status 10/19/2019 FINAL  Final   Organism ID, Bacteria ENTEROBACTER CLOACAE (A)  Final      Susceptibility   Enterobacter cloacae - MIC*    CEFAZOLIN >=64 RESISTANT  Resistant     CEFTRIAXONE 16 INTERMEDIATE Intermediate     CIPROFLOXACIN <=0.25 SENSITIVE Sensitive     GENTAMICIN <=1 SENSITIVE Sensitive     IMIPENEM 1 SENSITIVE Sensitive     NITROFURANTOIN 32 SENSITIVE Sensitive     TRIMETH/SULFA <=20 SENSITIVE Sensitive     PIP/TAZO 64 INTERMEDIATE Intermediate     * >=100,000 COLONIES/mL ENTEROBACTER CLOACAE  SARS CORONAVIRUS 2 (TAT 6-24 HRS) Nasopharyngeal Nasopharyngeal Swab     Status: None   Collection Time: 10/17/19 12:31 AM   Specimen: Nasopharyngeal Swab  Result Value Ref Range Status   SARS Coronavirus 2 NEGATIVE NEGATIVE Final    Comment: (NOTE) SARS-CoV-2 target nucleic acids are NOT DETECTED. The SARS-CoV-2 RNA is generally detectable in upper and lower respiratory specimens during the acute phase of infection. Negative results do not preclude SARS-CoV-2 infection, do not rule out co-infections with other pathogens, and should not be used as the sole basis for treatment or other patient management  decisions. Negative results must be combined with clinical observations, patient history, and epidemiological information. The expected result is Negative. Fact Sheet for Patients: SugarRoll.be Fact Sheet for Healthcare Providers: https://www.woods-mathews.com/ This test is not yet approved or cleared by the [place] FDA and  has been authorized for detection and/or diagnosis of SARS-CoV-2 by FDA under an Emergency Use Authorization (EUA). This EUA will remain  in effect (meaning this test can be used) for the duration of the COVID-19 declaration under Section 56 4(b)(1) of the Act, 21 U.S.C. section 360bbb-3(b)(1), unless the authorization is terminated or revoked sooner. Performed at [facility], [place] [address], [place], [place] [address]          Radiology Studies: Nm Myocar Multi W/spect W/wall Motion / Ef  Result Date: 10/19/2019  There was no ST segment deviation  noted during stress.  Defect 1: There is a large defect of moderate severity present in the basal inferoseptal, mid anteroseptal, mid inferoseptal and apical septal location.  Findings consistent with ischemia.  This is an intermediate risk study.  Nuclear stress EF: 61%.  Abnormal TID ratio of 1.67  Large size, moderate severity inferoseptal, septal and apical anteroseptal perfusion defect c/w ischemia (SDS 5). Abnormal TID ratio of 1.67. LVEF 61% with normal wall motion. This is an intermediate risk study. Clinical correlation advised.        Scheduled Meds: . amLODipine  5 mg Oral Daily  . apixaban  5 mg Oral BID  . atenolol  25 mg Oral Daily  . FLUoxetine  20 mg Oral Daily  . levothyroxine  112 mcg Oral Q0600  . potassium chloride  40 mEq Oral BID  . rosuvastatin  20 mg Oral Daily  . sodium chloride flush  3 mL Intravenous Once  . vitamin B-12  100 mcg Oral Daily   Continuous Infusions: . cefTRIAXone (ROCEPHIN)  IV Stopped (10/19/19 0729)     LOS: 2 days    Time spent: 27min    Domenic Polite, MD [facility] Page via [email or web address], password [email or web address] After 7PM please contact night-coverage  10/19/2019, 3:24 PM

## 2019-10-19 NOTE — Progress Notes (Signed)
  Lexiscan Myoview Pharmacologic stress portion completed. Images are currently pending. Signed,  Richardson Dopp, PA-C   10/19/2019 10:03 AM

## 2019-10-19 NOTE — Plan of Care (Signed)
  Problem: Activity: Goal: Capacity to carry out activities will improve Outcome: Progressing   Problem: Safety: Goal: Ability to remain free from injury will improve Outcome: Progressing   

## 2019-10-19 NOTE — Plan of Care (Signed)
  Problem: Education: Goal: Ability to demonstrate management of disease process will improve Outcome: Progressing Goal: Ability to verbalize understanding of medication therapies will improve Outcome: Progressing Goal: Individualized Educational Video(s) Outcome: Progressing   Problem: Activity: Goal: Capacity to carry out activities will improve Outcome: Progressing   Problem: Cardiac: Goal: Ability to achieve and maintain adequate cardiopulmonary perfusion will improve Outcome: Progressing   Problem: Clinical Measurements: Goal: Will remain free from infection Outcome: Progressing   Problem: Activity: Goal: Risk for activity intolerance will decrease Outcome: Progressing   Problem: Safety: Goal: Ability to remain free from injury will improve Outcome: Progressing   Problem: Education: Goal: Knowledge of vaso-occlusive preventative measures will improve Outcome: Progressing Goal: Awareness of infection prevention will improve Outcome: Progressing Goal: Awareness of signs and symptoms of anemia will improve Outcome: Progressing Goal: Long-term complications will improve Outcome: Progressing   Problem: Self-Care: Goal: Ability to incorporate actions that prevent/reduce pain crisis will improve Outcome: Progressing   Problem: Bowel/Gastric: Goal: Gut motility will be maintained Outcome: Progressing   Problem: Tissue Perfusion: Goal: Complications related to inadequate tissue perfusion will be avoided or minimized Outcome: Progressing   Problem: Respiratory: Goal: Pulmonary complications will be avoided or minimized Outcome: Progressing Goal: Acute Chest Syndrome will be identified early to prevent complications Outcome: Progressing   Problem: Fluid Volume: Goal: Ability to maintain a balanced intake and output will improve Outcome: Progressing   Problem: Sensory: Goal: Pain level will decrease with appropriate interventions Outcome: Progressing    Problem: Health Behavior: Goal: Postive changes in compliance with treatment and prescription regimens will improve Outcome: Progressing

## 2019-10-19 NOTE — Progress Notes (Addendum)
Progress Note  Patient Name: Faith Parker Date of Encounter: 10/19/2019  Primary Cardiologist: Dorris Carnes, MD  Subjective   Currently in nuclear med  Inpatient Medications    Scheduled Meds: . amLODipine  5 mg Oral Daily  . apixaban  5 mg Oral BID  . atenolol  25 mg Oral Daily  . FLUoxetine  20 mg Oral Daily  . levothyroxine  112 mcg Oral Q0600  . potassium chloride  40 mEq Oral BID  . rosuvastatin  20 mg Oral Daily  . sodium chloride flush  3 mL Intravenous Once  . vitamin B-12  100 mcg Oral Daily   Continuous Infusions: . cefTRIAXone (ROCEPHIN)  IV Stopped (10/19/19 0729)   PRN Meds: acetaminophen **OR** acetaminophen, ALPRAZolam, labetalol, ondansetron **OR** ondansetron (ZOFRAN) IV   Vital Signs    Vitals:   10/18/19 1120 10/18/19 1633 10/18/19 2002 10/19/19 0608  BP: (!) 166/69 140/72 136/68 139/80  Pulse: 61 65 70 64  Resp: 19 19 16 18   Temp: 98.5 F (36.9 C) 98.5 F (36.9 C) 98.3 F (36.8 C) 97.9 F (36.6 C)  TempSrc: Oral Oral Oral Oral  SpO2: 94% 96% 96% 92%  Weight:    90.9 kg  Height:        Intake/Output Summary (Last 24 hours) at 10/19/2019 0912 Last data filed at 10/19/2019 0500 Gross per 24 hour  Intake 320.94 ml  Output 1850 ml  Net -1529.06 ml   Last 3 Weights 10/19/2019 10/18/2019 10/17/2019  Weight (lbs) 200 lb 4.8 oz 204 lb 1.6 oz 197 lb  Weight (kg) 90.855 kg 92.579 kg 89.359 kg      Telemetry    Atrial paced Personally Reviewed  ECG    No new EKG to review- Personally Reviewed  Physical Exam   Exam not performed - patient in nuc med Labs    High Sensitivity Troponin:   Recent Labs  Lab 10/16/19 1605 10/16/19 2311 10/17/19 0325 10/17/19 0557  TROPONINIHS 9 10 17  24*     Chemistry Recent Labs  Lab 10/17/19 0325 10/17/19 1038 10/18/19 0449  NA 142 139 138  K 2.9* 3.6 3.5  CL 106 108 106  CO2 23 22 21*  GLUCOSE 115* 122* 115*  BUN 15 13 14   CREATININE 1.25* 1.20* 1.07*  CALCIUM 8.9 8.4* 8.6*  PROT  6.4*  --   --   ALBUMIN 3.7  --   --   AST 21  --   --   ALT 20  --   --   ALKPHOS 52  --   --   BILITOT 1.0  --   --   GFRNONAA 39* 41* 47*  GFRAA 45* 47* 54*  ANIONGAP 13 9 11      Hematology Recent Labs  Lab 10/16/19 1605 10/17/19 0325 10/18/19 0449  WBC 6.8 7.0 6.7  RBC 4.29 4.36 4.35  HGB 13.1 13.5 13.4  HCT 40.5 40.2 40.0  MCV 94.4 92.2 92.0  MCH 30.5 31.0 30.8  MCHC 32.3 33.6 33.5  RDW 13.6 13.4 13.3  PLT 210 194 196   BNP Recent Labs  Lab 10/17/19 0325  BNP 189.0*    DDimer No results for input(s): DDIMER in the last 168 hours.   Radiology    No results found.  Cardiac Studies   TTE: 10/17/2019  1. Left ventricular ejection fraction, by visual estimation, is 55 to 60%. The left ventricle has normal function. There is mildly increased left ventricular hypertrophy.  2. Mild  aortic stenosis is present. Vmax 2.3 cm/s, mean gradient 11 mmHG, AVA 1.86 cm2. No change from prior study (06/04/2018).  3. Small left ventricular internal cavity size.  4. The left ventricle has no regional wall motion abnormalities.  5. Global right ventricle has normal systolic function.The right ventricular size is normal. No increase in right ventricular wall thickness.  6. Left atrial size was mildly dilated.  7. Right atrial size was normal.  8. Presence of pericardial fat pad.  9. The mitral valve is degenerative. Mild mitral valve regurgitation. 10. The tricuspid valve is grossly normal. Tricuspid valve regurgitation is trivial. 11. The aortic valve has an indeterminant number of cusps. Aortic valve regurgitation is mild. Mild aortic valve stenosis. 12. There is Severe calcifcation of the aortic valve. 13. There is Severely thickening of the aortic valve. 14. The pulmonic valve was grossly normal. Pulmonic valve regurgitation is not visualized. 15. Mild plaque invoving the ascending aorta. 16. Normal pulmonary artery systolic pressure. 17. The tricuspid regurgitant velocity  is 2.34 m/s, and with an assumed right atrial pressure of 3 mmHg, the estimated right ventricular systolic pressure is normal at 24.9 mmHg. 18. A pacer wire is visualized in the RA and RV. 19. The inferior vena cava is normal in size with greater than 50% respiratory variability, suggesting right atrial pressure of 3 mmHg.  Patient Profile     85 y.o. female with a hx of Paroxysmal Afib, pauses s/p pacemaker, HTN, Aortic stenosis, ascending aortic aneurysm (4.1 cm 02/22/2018), hypothyroidism, and chronic UTI who is being seen today for the evaluation of exertional dyspnea.  She is a patient of Dr. Harrington Challenger and Dr. Caryl Comes, last seen in October 2019.  She has history of paroxysmal A. fib with significant pauses status post pacemaker placement, she has noted mild aortic stenosis and regurgitation based on echo in 2018 with hyperdynamic LVEF.  Her shortness of breath is exertional and started about a week ago, no associated chest pain orthopnea proximal nocturnal dyspnea.  Her pacemaker was interrogated a week ago and showed A. fib burden of 5%.    Assessment & Plan    Exertional Dyspnea -Patient reported DOE for 1 week.  -CXR with no acute disease.  -CT chest negative for PE and stable aortic aneurysm.  -Her troponins are negative.   -Chest CTA shows mild coronary calcifications and RCA and circumflex artery. -nuclear stress test pending this am.  If no ischemia then no further cardiac workup. -minimally elevated trop and DOE likely related to hypertensive urgency  Paroxysmal Afib On arrival patient was in NSR>> went into Afib RVR, rates to the 120s. Rates now down to the 60s. Was on atenolol 25 mg daily for rate control  - maintaining NSR On tele - continue BB for rate control  - CHADSVASC = 4 (female, age, CHF, HTN) - Continue Eliquis - TSH 0.687 - Potassium 3.5 - supplement - Continue Telemetry  Chronic Diastolic HF - EF [id number] with preserved EF and G2DD - 2D echo with normal LVF, mild AS/AR  and mild MR - BNP 189 - Appears euvolemic - net neg 1.5L - creatinine 1.07 yesterday  Pauses s/p pacemaker - [employer] device - last interrogation 09/10/19 showing AT/AF 5.3%  UTI/lower back pain - abx per IM  Mild Aortic Stenosis/AI -Stable on echo yesterday  Ascending aortic aneurysm -Stable at 41 mm  HTN - BP controlled at 139/27mmHg - continue BB and amlodipine 5 mg po daily - ARB held due to AKI  AKI -  creatinine 1.49 on admission. baseline 0.86,  1.07 yesterday - ARB held  For questions or updates, please contact [place] Please consult [email or web address] for contact info under     Signed, Fransico Him, MD  10/19/2019, 9:12 AM

## 2019-10-19 NOTE — Progress Notes (Signed)
Occupational Therapy Treatment Patient Details Name: Faith Parker MRN: [id number] DOB: January 10, 1930 Today's Date: 10/19/2019    History of present illness 85 year old female history of paroxysmal atrial fibrillation and pauses status post pacemaker, ascending aortic aneurysm, hypothyroidism, history of recurrent UTI presented to the emergency room overnight with worsening dyspnea on exertion for the last few weeks, in addition also reports chronic arthritis admitted back pain radiating to the right side.   OT comments  Pt. Seen for skilled OT treatment session. Provided A/E for demonstration and return demo for use with LB dressing.  Pt. Able to return demo with set up for pants but denied need for use.  Provided second return demo of LB dressing with out A/E and states she felt fine and did not need it.  Reviewed how to obtain if she changed her mind. Plans to return to her independent living apt.   Follow Up Recommendations  No OT follow up    Equipment Recommendations  None recommended by OT    Recommendations for Other Services      Precautions / Restrictions Precautions Precautions: Fall       Mobility Bed Mobility Overal bed mobility: Needs Assistance Bed Mobility: Supine to Sit     Supine to sit: Supervision     General bed mobility comments: able to come to eob and back to bed without physical assistance  Transfers                      Balance                                           ADL either performed or assessed with clinical judgement   ADL Overall ADL's : Needs assistance/impaired                     Lower Body Dressing: Supervision/safety;Sit to/from stand;Sitting/lateral leans;Cueing for compensatory techniques;With adaptive equipment Lower Body Dressing Details (indicate cue type and reason): introduced a/e, reacher for option for LB dressing to aide in cont. independence               General ADL Comments:  pt. able to return demo of a/e ie; reacher but was adamant she did not need or want it.  provided demo how she dons pants (with her acutal pants) and stated her back did not hurt at all and she was fine.  reviewed that is was an option and could help on the days that her back was bothering her. she denied chronic back pain and said her back only hurt from her kidney problems and she was not going to let me "sell her  anything" reviewed i dont sell the a/e options it was a tool i was showing her as an option.  she was under no obligation to change any parts of her adl routine.  she then said if she did end up wanting one her dtr. could order if from [facility] for her.     Vision       Perception     Praxis      Cognition   Behavior During Therapy: [facility] for tasks assessed/performed Overall Cognitive Status: Within Functional Limits for tasks assessed  Exercises     Shoulder Instructions       General Comments      Pertinent Vitals/ Pain       Pain Assessment: No/denies pain  Home Living                                          Prior Functioning/Environment              Frequency  Min 2X/week        Progress Toward Goals  OT Goals(current goals can now be found in the care plan section)  Progress towards OT goals: Progressing toward goals     Plan      Co-evaluation                 AM-PAC OT "6 Clicks" Daily Activity     Outcome Measure   Help from another person eating meals?: None Help from another person taking care of personal grooming?: A Little Help from another person toileting, which includes using toliet, bedpan, or urinal?: A Little Help from another person bathing (including washing, rinsing, drying)?: A Little Help from another person to put on and taking off regular upper body clothing?: None Help from another person to put on and taking off regular lower body  clothing?: A Little 6 Click Score: 20    End of Session Equipment Utilized During Treatment: Other (comment)(adaptive equipment)  OT Visit Diagnosis: Pain;Other abnormalities of gait and mobility (R26.89)   Activity Tolerance Patient tolerated treatment well   Patient Left in bed   Nurse Communication          Time: [id number] OT Time Calculation (min): 13 min  Charges: OT General Charges $OT Visit: 1 Visit OT Treatments $Self Care/Home Management : 8-22 mins  Janice Coffin, COTA/L 10/19/2019, 12:20 PM

## 2019-10-20 DIAGNOSIS — R9439 Abnormal result of other cardiovascular function study: Secondary | ICD-10-CM

## 2019-10-20 DIAGNOSIS — I5032 Chronic diastolic (congestive) heart failure: Secondary | ICD-10-CM

## 2019-10-20 LAB — BASIC METABOLIC PANEL
Anion gap: 9 (ref 5–15)
BUN: 16 mg/dL (ref 8–23)
CO2: 21 mmol/L — ABNORMAL LOW (ref 22–32)
Calcium: 9 mg/dL (ref 8.9–10.3)
Chloride: 107 mmol/L (ref 98–111)
Creatinine, Ser: 1.14 mg/dL — ABNORMAL HIGH (ref 0.44–1.00)
GFR calc Af Amer: 50 mL/min — ABNORMAL LOW (ref >60)
GFR calc non Af Amer: 43 mL/min — ABNORMAL LOW (ref >60)
Glucose, Bld: 122 mg/dL — ABNORMAL HIGH (ref 70–99)
Potassium: 4 mmol/L (ref 3.5–5.1)
Sodium: 137 mmol/L (ref 135–145)

## 2019-10-20 LAB — APTT
aPTT: 31 seconds (ref 24–36)
aPTT: 95 seconds — ABNORMAL HIGH (ref 24–36)

## 2019-10-20 LAB — HEPARIN LEVEL (UNFRACTIONATED): Heparin Unfractionated: >2.2 IU/mL — ABNORMAL HIGH (ref 0.30–0.70)

## 2019-10-20 MED ORDER — HEPARIN BOLUS VIA INFUSION
4100.0000 [IU] | Freq: Once | INTRAVENOUS | Status: AC
  Administered 2019-10-20: 4100 [IU] via INTRAVENOUS
  Filled 2019-10-20: qty 4100

## 2019-10-20 MED ORDER — POTASSIUM CHLORIDE CRYS ER 20 MEQ PO TBCR
40.0000 meq | EXTENDED_RELEASE_TABLET | Freq: Two times a day (BID) | ORAL | Status: DC
  Filled 2019-10-20: qty 2

## 2019-10-20 MED ORDER — ASPIRIN 81 MG PO CHEW: 81.0000 mg | CHEWABLE_TABLET | ORAL | Status: DC

## 2019-10-20 MED ORDER — HEPARIN (PORCINE) 25000 UT/250ML-% IV SOLN
1150.0000 [IU]/h | INTRAVENOUS | Status: DC
  Administered 2019-10-20 – 2019-10-21 (×2): 1150 [IU]/h via INTRAVENOUS
  Filled 2019-10-20 (×2): qty 250

## 2019-10-20 MED ORDER — ALUM & MAG HYDROXIDE-SIMETH 200-200-20 MG/5ML PO SUSP
30.0000 mL | Freq: Four times a day (QID) | ORAL | Status: DC | PRN
  Administered 2019-10-20: 30 mL via ORAL
  Filled 2019-10-20: qty 30

## 2019-10-20 NOTE — Progress Notes (Signed)
Patient resting comfortably during shift report. Denies complaints.  

## 2019-10-20 NOTE — Progress Notes (Signed)
ANTICOAGULATION CONSULT NOTE - [place] for heparin Indication: atrial fibrillation  Allergies  Allergen Reactions  . Ciprofloxacin Other (See Comments)    Unknown  . Latex Other (See Comments)    Unknown  . Levofloxacin Other (See Comments)    Unknown  . Macrobid [employer] Macro] Other (See Comments)    Unknown  . Nitrofurantoin Other (See Comments)    Unknown  . Sulfa Antibiotics Other (See Comments)    Unknown    Patient Measurements: Height: 5\' 8"  (172.7 cm) Weight: 200 lb 4.8 oz (90.9 kg) IBW/kg (Calculated) : 63.9 Heparin Dosing Weight: 83 kg  Vital Signs: Temp: 99.2 F (37.3 C) (11/15 1952) Temp Source: Oral (11/15 1952) BP: 124/68 (11/15 1952) Pulse Rate: 62 (11/15 1952)  Labs: Recent Labs    10/18/19 0449 10/20/19 0916 10/20/19 1911  HGB 13.4  --   --   HCT 40.0  --   --   PLT 196  --   --   APTT  --  31 95*  HEPARINUNFRC  --  >2.20*  --   CREATININE 1.07* 1.14*  --     Estimated Creatinine Clearance: 41 mL/min (A) (by C-G formula based on SCr of 1.14 mg/dL (H)).   Medical History: Past Medical History:  Diagnosis Date  . Anxiety   . Aortic regurgitation   . Atrial fibrillation ([place])   . Gastritis   . GERD (gastroesophageal reflux disease)   . H/O artificial eye lens   . Hypertension   . Hypothyroid   . Left ventricular hypertrophy   . Major depression   . Osteoarthritis   . Osteoporosis   . Peptic ulcer disease   . Stress incontinence     Medications:  Infusions:  . heparin 1,150 Units/hr (10/20/19 1133)    Assessment: 66 yof with a history of a fib (CHADSVASC = 4 (female, age, CHF, HTN)) on Eliquis PTA, last dose was 11/14 at 2200. Pharmacy consulted to dose heparin and hold Eliquis as patient is going to have cardiac cath done.   Initial aPTT is therapeutic at 95 seconds.  Goal of Therapy:  Heparin level 0.3-0.7 units/ml Target aPTT 66-102 seconds  Monitor platelets by  anticoagulation protocol: Yes   Plan:  -Continue heparin 1150 units/hr -Recheck aPTT and heparin level with morning labs   Arrie Senate, PharmD, BCPS Clinical Pharmacist [phone] Please check AMION for all [place] numbers 10/20/2019

## 2019-10-20 NOTE — Progress Notes (Signed)
Pt. Requesting med for heartburn. On call for [facility] paged to make aware. Alexandria Shiflett, Katherine Roan

## 2019-10-20 NOTE — Progress Notes (Signed)
PROGRESS NOTE    Faith Parker  [id number] DOB: 1930/09/30 DOA: 10/16/2019 PCP: Leighton Ruff, MD  Brief Narrative: 85 year old female history of paroxysmal atrial fibrillation and pauses status post pacemaker, ascending aortic aneurysm, hypothyroidism, history of recurrent UTI presented to the emergency room overnight with worsening dyspnea on exertion for the last few weeks, in addition also reports chronic arthritis admitted back pain radiating to the right side, she felt more dyspneic and diaphoretic last night and hence presented to the emergency room,. -In the ED she was noted to be afebrile initial EKG showed normal sinus rhythm, CT angiogram was negative for PE noted left upper lobe nodule and ascending aortic aneurysm, UA was abnormal, high-sensitivity troponin was negative, subsequently had a brief episode of A. fib RVR   Assessment & Plan:   Dyspnea on exertion /abnormal stress test -Reportedly ongoing for 1,  to 2 weeks -Chest x-ray unremarkable, CT angiogram negative for PE, showed stable aortic aneurysm -No evidence of fluid overload at this time -2D echocardiogram is unremarkable, only mild AS noted -Cardiology following, underwent stress test yesterday which is abnormal, concerning for inducible ischemia in multiple territories, this is an intermediate risk study will likely need left heart catheterization, this is being planned for Tuesday  Paroxysmal atrial fibrillation, pauses, pacemaker -Brief RVR on admission, heart rate controlled since -Continue atenolol, Eliquis held for cath, now on heparin per cardiology  Chronic diastolic CHF -Preserved EF, diastolic dysfunction, appears euvolemic  Possible Enterobacter UTI -No overt symptoms reported, completed 3 days of ceftriaxone to which it has intermediate sensitivity, no symptoms we will stop antibiotics now  Ascending aortic aneurysm -Stable  Lung nodule, left upper lobe -Consider follow-up, will defer  to PCP given advanced age  DVT prophylaxis: Apixaban Code Status: Full code Family Communication: No family at bedside, called and discussed with daughter 11/14 Disposition Plan: Home pending cardiac work-up  Consultants:   Cardiology   Procedures:   Antimicrobials:    Subjective: -Feels okay, mild weakness but denies any chest pain or dyspnea this morning, upset about abnormal stress test  Objective: Vitals:   10/19/19 1455 10/19/19 1607 10/19/19 2037 10/20/19 0423  BP: (!) 172/77 (!) 141/64 130/70 (!) 142/73  Pulse: 62 64 66 61  Resp: 20 20 16 18   Temp: 98 F (36.7 C) 98.9 F (37.2 C) 98 F (36.7 C) 98.5 F (36.9 C)  TempSrc: Oral Oral Oral Oral  SpO2: 98% 97% 97% 97%  Weight:    90.9 kg  Height:        Intake/Output Summary (Last 24 hours) at 10/20/2019 1125 Last data filed at 10/20/2019 0807 Gross per 24 hour  Intake 680 ml  Output 850 ml  Net -170 ml   Filed Weights   10/18/19 0431 10/19/19 0608 10/20/19 0423  Weight: 92.6 kg 90.9 kg 90.9 kg    Examination:  Gen: Awake, Alert, Oriented X 3, no distress HEENT: PERRLA, Neck supple, no JVD Lungs: Good air movement bilaterally, CTAB CVS: S1-S2, regular rhythm, systolic ejection murmur Abd: soft, Non tender, non distended, BS present Extremities: No edema Skin: no new rashes Psychiatry: Judgement and insight appear normal. Mood & affect appropriate.     Data Reviewed:   CBC: Recent Labs  Lab 10/16/19 1605 10/17/19 0325 10/18/19 0449  WBC 6.8 7.0 6.7  NEUTROABS  --  5.4  --   HGB 13.1 13.5 13.4  HCT 40.5 40.2 40.0  MCV 94.4 92.2 92.0  PLT 210 194 [id number]   Basic Metabolic  Panel: Recent Labs  Lab 10/16/19 1605 10/17/19 0325 10/17/19 1038 10/18/19 0449 10/20/19 0916  NA 139 142 139 138 137  K 3.2* 2.9* 3.6 3.5 4.0  CL 105 106 108 106 107  CO2 21* 23 22 21* 21*  GLUCOSE 108* 115* 122* 115* 122*  BUN 16 15 13 14 16   CREATININE 1.49* 1.25* 1.20* 1.07* 1.14*  CALCIUM 9.2 8.9 8.4* 8.6*  9.0  MG  --  2.1  --   --   --    GFR: Estimated Creatinine Clearance: 41 mL/min (A) (by C-G formula based on SCr of 1.14 mg/dL (H)). Liver Function Tests: Recent Labs  Lab 10/17/19 0325  AST 21  ALT 20  ALKPHOS 52  BILITOT 1.0  PROT 6.4*  ALBUMIN 3.7   Recent Labs  Lab 10/17/19 0557  LIPASE 35   No results for input(s): AMMONIA in the last 168 hours. Coagulation Profile: No results for input(s): INR, PROTIME in the last 168 hours. Cardiac Enzymes: No results for input(s): CKTOTAL, CKMB, CKMBINDEX, TROPONINI in the last 168 hours. BNP (last 3 results) No results for input(s): PROBNP in the last 8760 hours. HbA1C: No results for input(s): HGBA1C in the last 72 hours. CBG: No results for input(s): GLUCAP in the last 168 hours. Lipid Profile: No results for input(s): CHOL, HDL, LDLCALC, TRIG, CHOLHDL, LDLDIRECT in the last 72 hours. Thyroid Function Tests: No results for input(s): TSH, T4TOTAL, FREET4, T3FREE, THYROIDAB in the last 72 hours. Anemia Panel: No results for input(s): VITAMINB12, FOLATE, FERRITIN, TIBC, IRON, RETICCTPCT in the last 72 hours. Urine analysis:    Component Value Date/Time   COLORURINE YELLOW 10/16/2019 1923   APPEARANCEUR CLOUDY (A) 10/16/2019 1923   LABSPEC 1.014 10/16/2019 1923   PHURINE 6.0 10/16/2019 1923   GLUCOSEU 50 (A) 10/16/2019 1923   HGBUR SMALL (A) 10/16/2019 1923   BILIRUBINUR NEGATIVE 10/16/2019 1923   KETONESUR 5 (A) 10/16/2019 1923   PROTEINUR >=300 (A) 10/16/2019 1923   UROBILINOGEN 0.2 06/30/2016 1149   NITRITE NEGATIVE 10/16/2019 1923   LEUKOCYTESUR NEGATIVE 10/16/2019 1923   Sepsis Labs: @LABRCNTIP (procalcitonin:4,lacticidven:4)  ) Recent Results (from the past 240 hour(s))  Urine culture     Status: Abnormal   Collection Time: 10/16/19 10:15 PM   Specimen: Urine, Catheterized  Result Value Ref Range Status   Specimen Description URINE, CATHETERIZED  Final   Special Requests   Final    NONE Performed at [facility], [place] [address] [place], [place] [address]    Culture >=100,000 COLONIES/mL ENTEROBACTER CLOACAE (A)  Final   Report Status 10/19/2019 FINAL  Final   Organism ID, Bacteria ENTEROBACTER CLOACAE (A)  Final      Susceptibility   Enterobacter cloacae - MIC*    CEFAZOLIN >=64 RESISTANT Resistant     CEFTRIAXONE 16 INTERMEDIATE Intermediate     CIPROFLOXACIN <=0.25 SENSITIVE Sensitive     GENTAMICIN <=1 SENSITIVE Sensitive     IMIPENEM 1 SENSITIVE Sensitive     NITROFURANTOIN 32 SENSITIVE Sensitive     TRIMETH/SULFA <=20 SENSITIVE Sensitive     PIP/TAZO 64 INTERMEDIATE Intermediate     * >=100,000 COLONIES/mL ENTEROBACTER CLOACAE  SARS CORONAVIRUS 2 (TAT 6-24 HRS) Nasopharyngeal Nasopharyngeal Swab     Status: None   Collection Time: 10/17/19 12:31 AM   Specimen: Nasopharyngeal Swab  Result Value Ref Range Status   SARS Coronavirus 2 NEGATIVE NEGATIVE Final    Comment: (NOTE) SARS-CoV-2 target nucleic acids are NOT DETECTED. The SARS-CoV-2 RNA  is generally detectable in upper and lower respiratory specimens during the acute phase of infection. Negative results do not preclude SARS-CoV-2 infection, do not rule out co-infections with other pathogens, and should not be used as the sole basis for treatment or other patient management decisions. Negative results must be combined with clinical observations, patient history, and epidemiological information. The expected result is Negative. Fact Sheet for Patients: SugarRoll.be Fact Sheet for Healthcare Providers: https://www.woods-mathews.com/ This test is not yet approved or cleared by the [place] FDA and  has been authorized for detection and/or diagnosis of SARS-CoV-2 by FDA under an Emergency Use Authorization (EUA). This EUA will remain  in effect (meaning this test can be used) for the duration of the COVID-19 declaration under Section 56 4(b)(1) of the Act, 21 U.S.C.  section 360bbb-3(b)(1), unless the authorization is terminated or revoked sooner. Performed at [facility], [place] [address] [place], [place] [address]          Radiology Studies: Nm Myocar Multi W/spect W/wall Motion / Ef  Result Date: 10/19/2019  There was no ST segment deviation noted during stress.  Defect 1: There is a large defect of moderate severity present in the basal inferoseptal, mid anteroseptal, mid inferoseptal and apical septal location.  Findings consistent with ischemia.  This is an intermediate risk study.  Nuclear stress EF: 61%.  Abnormal TID ratio of 1.67  Large size, moderate severity inferoseptal, septal and apical anteroseptal perfusion defect c/w ischemia (SDS 5). Abnormal TID ratio of 1.67. LVEF 61% with normal wall motion. This is an intermediate risk study. Clinical correlation advised.        Scheduled Meds: . amLODipine  5 mg Oral Daily  . atenolol  25 mg Oral Daily  . FLUoxetine  20 mg Oral Daily  . heparin  4,100 Units Intravenous Once  . levothyroxine  112 mcg Oral Q0600  . potassium chloride  40 mEq Oral BID  . rosuvastatin  20 mg Oral Daily  . sodium chloride flush  3 mL Intravenous Once  . vitamin B-12  100 mcg Oral Daily   Continuous Infusions: . heparin       LOS: 3 days    Time spent: 39min    Domenic Polite, MD [facility] Page via [email or web address], password [email or web address] After 7PM please contact night-coverage  10/20/2019, 11:25 AM

## 2019-10-20 NOTE — Progress Notes (Signed)
   10/20/19 1852  MEWS Assessment  Is this an acute change? No      Vital Signs MEWS/VS Documentation       10/20/2019 0700 10/20/2019 0807 10/20/2019 1100 10/20/2019 1502   MEWS Score:  0  0  0  0   MEWS Score Color:  Green  Green  Green  Green   Pulse:  --  --  --  62   BP:  --  --  108/66  125/71   Temp:  --  --  --  98.3 F (36.8 C)   O2 Device:  --  --  --  Room Air   Level of Consciousness:  --  Alert  --  --            Gladis Riffle 10/20/2019,6:52 PM

## 2019-10-20 NOTE — Progress Notes (Signed)
Pt requested for her potassium to be changed to pill form because that's how she takes it at home she stated. The medication was then changed to pill form, and she still complaints that the one she takes at home is coated and refused the one offered. I have already notified the pharmacist who agreed to change the potassium to small 10 MEQ. Pt agreed to try those next time.

## 2019-10-20 NOTE — Progress Notes (Signed)
ANTICOAGULATION CONSULT NOTE - Initial Consult  Pharmacy Consult for heparin Indication: atrial fibrillation  Allergies  Allergen Reactions  . Ciprofloxacin Other (See Comments)    Unknown  . Latex Other (See Comments)    Unknown  . Levofloxacin Other (See Comments)    Unknown  . Macrobid [employer] Macro] Other (See Comments)    Unknown  . Nitrofurantoin Other (See Comments)    Unknown  . Sulfa Antibiotics Other (See Comments)    Unknown    Patient Measurements: Height: 5\' 8"  (172.7 cm) Weight: 200 lb 4.8 oz (90.9 kg) IBW/kg (Calculated) : 63.9 Heparin Dosing Weight: 83 kg  Vital Signs: Temp: 98.5 F (36.9 C) (11/15 0423) Temp Source: Oral (11/15 0423) BP: 142/73 (11/15 0423) Pulse Rate: 61 (11/15 0423)  Labs: Recent Labs    10/17/19 1038 10/18/19 0449  HGB  --  13.4  HCT  --  40.0  PLT  --  196  CREATININE 1.20* 1.07*    Estimated Creatinine Clearance: 43.7 mL/min (A) (by C-G formula based on SCr of 1.07 mg/dL (H)).   Medical History: Past Medical History:  Diagnosis Date  . Anxiety   . Aortic regurgitation   . Atrial fibrillation ([place])   . Gastritis   . GERD (gastroesophageal reflux disease)   . H/O artificial eye lens   . Hypertension   . Hypothyroid   . Left ventricular hypertrophy   . Major depression   . Osteoarthritis   . Osteoporosis   . Peptic ulcer disease   . Stress incontinence     Medications:  Infusions:  . cefTRIAXone (ROCEPHIN)  IV Stopped (10/19/19 2220)    Assessment: 86 yof with a history of a fib (CHADSVASC = 4 (female, age, CHF, HTN)) on Eliquis PTA, last dose was 11/14 at 2200. Pharmacy consulted to dose heparin and hold Eliquis as patient is going to have cardiac cath done.   Renal function is noted to be okay with a CrCl around 44 ml/min. Last CBC was 11/13 but H&H was stable at 13.4/40 and plts were wnl. Baseline aPTT is 31  Goal of Therapy:  Heparin level 0.3-0.7 units/ml Target aPTT 66-102 seconds   Monitor platelets by anticoagulation protocol: Yes   Plan:  Stat aPTT and heparin level (completed) Wait 12 hours after last apixaban dose to begin heparin infusion (~10 am 11/15) Bolus heparin with 4100 units Start heparin infusion at 1150 units/hr  Check anti-Xa level and aPTT in 8 hours and daily while on heparin Continue to monitor H&H and platelets Monitor for signs of bleeding   Thank you,   Eddie Candle, PharmD PGY-1 Pharmacy Resident   Please check amion for clinical pharmacist contact number   10/20/2019,8:53 AM

## 2019-10-20 NOTE — Progress Notes (Signed)
Progress Note  Patient Name: Faith Parker Date of Encounter: 10/20/2019  Primary Cardiologist: Dorris Carnes, MD  Subjective   Denies any CP or SOB  Inpatient Medications    Scheduled Meds: . amLODipine  5 mg Oral Daily  . apixaban  5 mg Oral BID  . atenolol  25 mg Oral Daily  . FLUoxetine  20 mg Oral Daily  . levothyroxine  112 mcg Oral Q0600  . potassium chloride  40 mEq Oral BID  . rosuvastatin  20 mg Oral Daily  . sodium chloride flush  3 mL Intravenous Once  . vitamin B-12  100 mcg Oral Daily   Continuous Infusions: . cefTRIAXone (ROCEPHIN)  IV Stopped (10/19/19 2220)   PRN Meds: acetaminophen **OR** acetaminophen, ALPRAZolam, alum & mag hydroxide-simeth, labetalol, ondansetron **OR** ondansetron (ZOFRAN) IV   Vital Signs    Vitals:   10/19/19 1455 10/19/19 1607 10/19/19 2037 10/20/19 0423  BP: (!) 172/77 (!) 141/64 130/70 (!) 142/73  Pulse: 62 64 66 61  Resp: 20 20 16 18   Temp: 98 F (36.7 C) 98.9 F (37.2 C) 98 F (36.7 C) 98.5 F (36.9 C)  TempSrc: Oral Oral Oral Oral  SpO2: 98% 97% 97% 97%  Weight:    90.9 kg  Height:        Intake/Output Summary (Last 24 hours) at 10/20/2019 0841 Last data filed at 10/20/2019 [id number] Gross per 24 hour  Intake 680 ml  Output 850 ml  Net -170 ml   Last 3 Weights 10/20/2019 10/19/2019 10/18/2019  Weight (lbs) 200 lb 4.8 oz 200 lb 4.8 oz 204 lb 1.6 oz  Weight (kg) 90.855 kg 90.855 kg 92.579 kg      Telemetry    Atrial paced - Personally Reviewed  ECG    No new EKG to review- Personally Reviewed  Physical Exam   GEN: Well nourished, well developed in no acute distress HEENT: Normal NECK: No JVD; No carotid bruits LYMPHATICS: No lymphadenopathy CARDIAC:RRR, no murmurs, rubs, gallops RESPIRATORY:  Clear to auscultation without rales, wheezing or rhonchi  ABDOMEN: Soft, non-tender, non-distended MUSCULOSKELETAL:  No edema; No deformity  SKIN: Warm and dry NEUROLOGIC:  Alert and oriented x 3  PSYCHIATRIC:  Normal affect   Labs    High Sensitivity Troponin:   Recent Labs  Lab 10/16/19 1605 10/16/19 2311 10/17/19 0325 10/17/19 0557  TROPONINIHS 9 10 17  24*     Chemistry Recent Labs  Lab 10/17/19 0325 10/17/19 1038 10/18/19 0449  NA 142 139 138  K 2.9* 3.6 3.5  CL 106 108 106  CO2 23 22 21*  GLUCOSE 115* 122* 115*  BUN 15 13 14   CREATININE 1.25* 1.20* 1.07*  CALCIUM 8.9 8.4* 8.6*  PROT 6.4*  --   --   ALBUMIN 3.7  --   --   AST 21  --   --   ALT 20  --   --   ALKPHOS 52  --   --   BILITOT 1.0  --   --   GFRNONAA 39* 41* 47*  GFRAA 45* 47* 54*  ANIONGAP 13 9 11      Hematology Recent Labs  Lab 10/16/19 1605 10/17/19 0325 10/18/19 0449  WBC 6.8 7.0 6.7  RBC 4.29 4.36 4.35  HGB 13.1 13.5 13.4  HCT 40.5 40.2 40.0  MCV 94.4 92.2 92.0  MCH 30.5 31.0 30.8  MCHC 32.3 33.6 33.5  RDW 13.6 13.4 13.3  PLT 210 194 196   BNP Recent Labs  Lab 10/17/19 0325  BNP 189.0*    DDimer No results for input(s): DDIMER in the last 168 hours.   Radiology    Nm Myocar Multi W/spect W/wall Motion / Ef  Result Date: 10/19/2019  There was no ST segment deviation noted during stress.  Defect 1: There is a large defect of moderate severity present in the basal inferoseptal, mid anteroseptal, mid inferoseptal and apical septal location.  Findings consistent with ischemia.  This is an intermediate risk study.  Nuclear stress EF: 61%.  Abnormal TID ratio of 1.67  Large size, moderate severity inferoseptal, septal and apical anteroseptal perfusion defect c/w ischemia (SDS 5). Abnormal TID ratio of 1.67. LVEF 61% with normal wall motion. This is an intermediate risk study. Clinical correlation advised.    Cardiac Studies   TTE: 10/17/2019  1. Left ventricular ejection fraction, by visual estimation, is 55 to 60%. The left ventricle has normal function. There is mildly increased left ventricular hypertrophy.  2. Mild aortic stenosis is present. Vmax 2.3 cm/s, mean  gradient 11 mmHG, AVA 1.86 cm2. No change from prior study (06/04/2018).  3. Small left ventricular internal cavity size.  4. The left ventricle has no regional wall motion abnormalities.  5. Global right ventricle has normal systolic function.The right ventricular size is normal. No increase in right ventricular wall thickness.  6. Left atrial size was mildly dilated.  7. Right atrial size was normal.  8. Presence of pericardial fat pad.  9. The mitral valve is degenerative. Mild mitral valve regurgitation. 10. The tricuspid valve is grossly normal. Tricuspid valve regurgitation is trivial. 11. The aortic valve has an indeterminant number of cusps. Aortic valve regurgitation is mild. Mild aortic valve stenosis. 12. There is Severe calcifcation of the aortic valve. 13. There is Severely thickening of the aortic valve. 14. The pulmonic valve was grossly normal. Pulmonic valve regurgitation is not visualized. 15. Mild plaque invoving the ascending aorta. 16. Normal pulmonary artery systolic pressure. 17. The tricuspid regurgitant velocity is 2.34 m/s, and with an assumed right atrial pressure of 3 mmHg, the estimated right ventricular systolic pressure is normal at 24.9 mmHg. 18. A pacer wire is visualized in the RA and RV. 19. The inferior vena cava is normal in size with greater than 50% respiratory variability, suggesting right atrial pressure of 3 mmHg.  Patient Profile     85 y.o. female with a hx of Paroxysmal Afib, pauses s/p pacemaker, HTN, Aortic stenosis, ascending aortic aneurysm (4.1 cm 02/22/2018), hypothyroidism, and chronic UTI who is being seen today for the evaluation of exertional dyspnea.  She is a patient of Dr. Harrington Challenger and Dr. Caryl Comes, last seen in October 2019.  She has history of paroxysmal A. fib with significant pauses status post pacemaker placement, she has noted mild aortic stenosis and regurgitation based on echo in 2018 with hyperdynamic LVEF.  Her shortness of breath is  exertional and started about a week ago, no associated chest pain orthopnea proximal nocturnal dyspnea.  Her pacemaker was interrogated a week ago and showed A. fib burden of 5%.    Assessment & Plan    Exertional Dyspnea -Patient reported DOE for 1 week.  -CXR with no acute disease.  -CT chest negative for PE and stable aortic aneurysm.  -Her troponins are negative.   -Chest CTA shows mild coronary calcifications and RCA and circumflex artery. -nuclear stress test personally reviewed by myself and is abnormal with possible ischemia in the inferoseptal, septal and apical anteroseptal walls with  transient ischemic dilatation (TID 1.67) and normal wall motion.  This is a poor quality study and counts appear to be down diffusely on stress images in comparison to rest.  Given transient ischemic dilatation recommend proceeding with heart cath. -will make NPO tomorrow PM for cardiac cath in Tuesday - needs Eliquis washout. -Cardiac catheterization was discussed with the patient fully. The patient understands that risks include but are not limited to stroke (1 in 1000), death (1 in 6), kidney failure [usually temporary] (1 in 500), bleeding (1 in 200), allergic reaction [possibly serious] (1 in 200).  The patient understands and is willing to proceed.    Paroxysmal Afib -On arrival patient was in NSR>> went into Afib RVR, rates to the 120s. Rates now down to the 60s. Was on atenolol 25 mg daily for rate control  - maintaining NSR On tele - continue BB for rate control  - CHADSVASC = 4 (female, age, CHF, HTN) - Hold Eliquis for cath - start IV Heparin per pharmacy - TSH 0.687 - Potassium 3.5 - supplement - Continue Telemetry  Chronic Diastolic HF - EF [id number] with preserved EF and G2DD - 2D echo with normal LVF, mild AS/AR and mild MR - BNP 189 - Appears euvolemic - net neg 1.7L - creatinine 1.07 11/13 - repeat BMET today  Pauses s/p pacemaker - [employer] device - last interrogation  09/10/19 showing AT/AF 5.3%  UTI/lower back pain - abx per IM  Mild Aortic Stenosis/AI -Stable on echo this admit  Ascending aortic aneurysm -Stable at 41 mm  HTN - BP controlled at 142/55mmHg - continue BB and amlodipine 5 mg po daily - ARB held due to AKI  AKI - creatinine 1.49 on admission. baseline 0.86,  1.07 on 11/13 - ARB held  For questions or updates, please contact [place] Please consult [email or web address] for contact info under     Signed, Fransico Him, MD  10/20/2019, 8:41 AM

## 2019-10-21 LAB — BASIC METABOLIC PANEL
Anion gap: 13 (ref 5–15)
BUN: 19 mg/dL (ref 8–23)
CO2: 21 mmol/L — ABNORMAL LOW (ref 22–32)
Calcium: 8.9 mg/dL (ref 8.9–10.3)
Chloride: 106 mmol/L (ref 98–111)
Creatinine, Ser: 1.36 mg/dL — ABNORMAL HIGH (ref 0.44–1.00)
GFR calc Af Amer: 40 mL/min — ABNORMAL LOW (ref >60)
GFR calc non Af Amer: 35 mL/min — ABNORMAL LOW (ref >60)
Glucose, Bld: 99 mg/dL (ref 70–99)
Potassium: 3.8 mmol/L (ref 3.5–5.1)
Sodium: 140 mmol/L (ref 135–145)

## 2019-10-21 LAB — CBC
HCT: 39 % (ref 36.0–46.0)
Hemoglobin: 13.3 g/dL (ref 12.0–15.0)
MCH: 31 pg (ref 26.0–34.0)
MCHC: 34.1 g/dL (ref 30.0–36.0)
MCV: 90.9 fL (ref 80.0–100.0)
Platelets: 186 10*3/uL (ref 150–400)
RBC: 4.29 MIL/uL (ref 3.87–5.11)
RDW: 13.4 % (ref 11.5–15.5)
WBC: 6.7 10*3/uL (ref 4.0–10.5)
nRBC: 0 % (ref 0.0–0.2)

## 2019-10-21 LAB — APTT
aPTT: 159 seconds — ABNORMAL HIGH (ref 24–36)
aPTT: 48 seconds — ABNORMAL HIGH (ref 24–36)

## 2019-10-21 LAB — HEPARIN LEVEL (UNFRACTIONATED): Heparin Unfractionated: 1.72 IU/mL — ABNORMAL HIGH (ref 0.30–0.70)

## 2019-10-21 MED ORDER — POTASSIUM CHLORIDE CRYS ER 10 MEQ PO TBCR
40.0000 meq | EXTENDED_RELEASE_TABLET | Freq: Two times a day (BID) | ORAL | Status: DC
  Administered 2019-10-21 – 2019-10-22 (×3): 40 meq via ORAL
  Filled 2019-10-21 (×3): qty 4

## 2019-10-21 MED ORDER — ASPIRIN 81 MG PO CHEW
81.0000 mg | CHEWABLE_TABLET | ORAL | Status: AC
  Administered 2019-10-22: 81 mg via ORAL
  Filled 2019-10-21: qty 1

## 2019-10-21 MED ORDER — POLYETHYLENE GLYCOL 3350 17 G PO PACK
17.0000 g | PACK | Freq: Every day | ORAL | Status: DC
  Administered 2019-10-21 – 2019-10-22 (×2): 17 g via ORAL
  Filled 2019-10-21 (×2): qty 1

## 2019-10-21 MED ORDER — SODIUM CHLORIDE 0.9 % IV SOLN
INTRAVENOUS | Status: DC
  Administered 2019-10-21 (×2): via INTRAVENOUS

## 2019-10-21 MED ORDER — HEPARIN (PORCINE) 25000 UT/250ML-% IV SOLN
1000.0000 [IU]/h | INTRAVENOUS | Status: DC
  Administered 2019-10-21: 900 [IU]/h via INTRAVENOUS
  Filled 2019-10-21: qty 250

## 2019-10-21 MED ORDER — SODIUM CHLORIDE 0.9 % WEIGHT BASED INFUSION
1.0000 mL/kg/h | INTRAVENOUS | Status: DC
  Administered 2019-10-22: 1 mL/kg/h via INTRAVENOUS

## 2019-10-21 MED ORDER — SODIUM CHLORIDE 0.9 % WEIGHT BASED INFUSION
3.0000 mL/kg/h | INTRAVENOUS | Status: AC
  Administered 2019-10-22: 3 mL/kg/h via INTRAVENOUS

## 2019-10-21 NOTE — Progress Notes (Signed)
Patient need consent for Cath tomorrow daughter with come in early for procedure.

## 2019-10-21 NOTE — Progress Notes (Addendum)
PROGRESS NOTE    Faith Parker  [id number] DOB: 1930/01/24 DOA: 10/16/2019 PCP: Leighton Ruff, MD  Brief Narrative: 85 year old female history of paroxysmal atrial fibrillation and pauses status post pacemaker, ascending aortic aneurysm, hypothyroidism, history of recurrent UTI presented to the emergency room overnight with worsening dyspnea on exertion for the last few weeks, in addition also reports chronic arthritis admitted back pain radiating to the right side, she felt more dyspneic and diaphoretic last night and hence presented to the emergency room,. -In the ED she was noted to be afebrile initial EKG showed normal sinus rhythm, CT angiogram was negative for PE noted left upper lobe nodule and ascending aortic aneurysm, UA was abnormal, high-sensitivity troponin was negative, subsequently had a brief episode of A. fib RVR   Assessment & Plan:   Dyspnea on exertion /abnormal stress test -Reportedly ongoing for 1,  to 2 weeks -Chest x-ray unremarkable, CT angiogram negative for PE, showed stable aortic aneurysm -No evidence of fluid overload  -2D echocardiogram is unremarkable, only mild AS noted -Cardiology following, underwent stress test  which is abnormal, concerning for inducible ischemia in multiple territories, was an intermediate risk study -Plan for left heart cath tomorrow  Paroxysmal atrial fibrillation, pauses, pacemaker -Brief RVR on admission, heart rate controlled since -Continue atenolol, Eliquis held for cath, now on heparin per cardiology  Chronic diastolic CHF -Preserved EF, diastolic dysfunction, appears euvolemic  Possible Enterobacter UTI -No overt symptoms reported, completed 3 days of ceftriaxone to which it has intermediate sensitivity, no symptoms, all antibiotics stopped yesterday  Ascending aortic aneurysm -Stable  Lung nodule, left upper lobe -Consider follow-up, will defer to PCP given advanced age  DVT prophylaxis: Apixaban Code  Status: DNR per pt and daughter Family Communication: No family at bedside, called and discussed with daughter 11/14 Disposition Plan: Home pending cardiac work-up  Consultants:   Cardiology   Procedures:   Antimicrobials:    Subjective: -Feels okay, upset about being stuck in bed, denies any chest pain or dyspnea  Objective: Vitals:   10/20/19 1952 10/21/19 0550 10/21/19 0738 10/21/19 1231  BP: 124/68 108/74 134/68 113/67  Pulse: 62 61 61 60  Resp: 18 18 14    Temp: 99.2 F (37.3 C) 98 F (36.7 C) 98.5 F (36.9 C) 98.4 F (36.9 C)  TempSrc: Oral  Oral Oral  SpO2: 96% 98% 99% 97%  Weight:  91.3 kg    Height:        Intake/Output Summary (Last 24 hours) at 10/21/2019 1522 Last data filed at 10/21/2019 1426 Gross per 24 hour  Intake 940.81 ml  Output 1550 ml  Net -609.19 ml   Filed Weights   10/19/19 0608 10/20/19 0423 10/21/19 0550  Weight: 90.9 kg 90.9 kg 91.3 kg    Examination:  Gen: Awake, Alert, Oriented X 3, no distress HEENT: PERRLA, Neck supple, no JVD Lungs: Good air movement bilaterally, CTAB CVS: RRR, systolic ejection murmur Abd: soft, Non tender, non distended, BS present Extremities: No edema  skin: no new rashes Psychiatry: Judgement and insight appear normal. Mood & affect appropriate.     Data Reviewed:   CBC: Recent Labs  Lab 10/16/19 1605 10/17/19 0325 10/18/19 0449 10/21/19 0544  WBC 6.8 7.0 6.7 6.7  NEUTROABS  --  5.4  --   --   HGB 13.1 13.5 13.4 13.3  HCT 40.5 40.2 40.0 39.0  MCV 94.4 92.2 92.0 90.9  PLT 210 194 196 [id number]   Basic Metabolic Panel: Recent Labs  Lab  10/17/19 0325 10/17/19 1038 10/18/19 0449 10/20/19 0916 10/21/19 0544  NA 142 139 138 137 140  K 2.9* 3.6 3.5 4.0 3.8  CL 106 108 106 107 106  CO2 23 22 21* 21* 21*  GLUCOSE 115* 122* 115* 122* 99  BUN 15 13 14 16 19   CREATININE 1.25* 1.20* 1.07* 1.14* 1.36*  CALCIUM 8.9 8.4* 8.6* 9.0 8.9  MG 2.1  --   --   --   --    GFR: Estimated Creatinine  Clearance: 34.5 mL/min (A) (by C-G formula based on SCr of 1.36 mg/dL (H)). Liver Function Tests: Recent Labs  Lab 10/17/19 0325  AST 21  ALT 20  ALKPHOS 52  BILITOT 1.0  PROT 6.4*  ALBUMIN 3.7   Recent Labs  Lab 10/17/19 0557  LIPASE 35   No results for input(s): AMMONIA in the last 168 hours. Coagulation Profile: No results for input(s): INR, PROTIME in the last 168 hours. Cardiac Enzymes: No results for input(s): CKTOTAL, CKMB, CKMBINDEX, TROPONINI in the last 168 hours. BNP (last 3 results) No results for input(s): PROBNP in the last 8760 hours. HbA1C: No results for input(s): HGBA1C in the last 72 hours. CBG: No results for input(s): GLUCAP in the last 168 hours. Lipid Profile: No results for input(s): CHOL, HDL, LDLCALC, TRIG, CHOLHDL, LDLDIRECT in the last 72 hours. Thyroid Function Tests: No results for input(s): TSH, T4TOTAL, FREET4, T3FREE, THYROIDAB in the last 72 hours. Anemia Panel: No results for input(s): VITAMINB12, FOLATE, FERRITIN, TIBC, IRON, RETICCTPCT in the last 72 hours. Urine analysis:    Component Value Date/Time   COLORURINE YELLOW 10/16/2019 1923   APPEARANCEUR CLOUDY (A) 10/16/2019 1923   LABSPEC 1.014 10/16/2019 1923   PHURINE 6.0 10/16/2019 1923   GLUCOSEU 50 (A) 10/16/2019 1923   HGBUR SMALL (A) 10/16/2019 1923   BILIRUBINUR NEGATIVE 10/16/2019 1923   KETONESUR 5 (A) 10/16/2019 1923   PROTEINUR >=300 (A) 10/16/2019 1923   UROBILINOGEN 0.2 06/30/2016 1149   NITRITE NEGATIVE 10/16/2019 1923   LEUKOCYTESUR NEGATIVE 10/16/2019 1923   Sepsis Labs: @LABRCNTIP (procalcitonin:4,lacticidven:4)  ) Recent Results (from the past 240 hour(s))  Urine culture     Status: Abnormal   Collection Time: 10/16/19 10:15 PM   Specimen: Urine, Catheterized  Result Value Ref Range Status   Specimen Description URINE, CATHETERIZED  Final   Special Requests   Final    NONE Performed at [facility], [place] [address] [place], [place] [address]     Culture >=100,000 COLONIES/mL ENTEROBACTER CLOACAE (A)  Final   Report Status 10/19/2019 FINAL  Final   Organism ID, Bacteria ENTEROBACTER CLOACAE (A)  Final      Susceptibility   Enterobacter cloacae - MIC*    CEFAZOLIN >=64 RESISTANT Resistant     CEFTRIAXONE 16 INTERMEDIATE Intermediate     CIPROFLOXACIN <=0.25 SENSITIVE Sensitive     GENTAMICIN <=1 SENSITIVE Sensitive     IMIPENEM 1 SENSITIVE Sensitive     NITROFURANTOIN 32 SENSITIVE Sensitive     TRIMETH/SULFA <=20 SENSITIVE Sensitive     PIP/TAZO 64 INTERMEDIATE Intermediate     * >=100,000 COLONIES/mL ENTEROBACTER CLOACAE  SARS CORONAVIRUS 2 (TAT 6-24 HRS) Nasopharyngeal Nasopharyngeal Swab     Status: None   Collection Time: 10/17/19 12:31 AM   Specimen: Nasopharyngeal Swab  Result Value Ref Range Status   SARS Coronavirus 2 NEGATIVE NEGATIVE Final    Comment: (NOTE) SARS-CoV-2 target nucleic acids are NOT DETECTED. The SARS-CoV-2 RNA is generally detectable in upper  and lower respiratory specimens during the acute phase of infection. Negative results do not preclude SARS-CoV-2 infection, do not rule out co-infections with other pathogens, and should not be used as the sole basis for treatment or other patient management decisions. Negative results must be combined with clinical observations, patient history, and epidemiological information. The expected result is Negative. Fact Sheet for Patients: SugarRoll.be Fact Sheet for Healthcare Providers: https://www.woods-mathews.com/ This test is not yet approved or cleared by the [place] FDA and  has been authorized for detection and/or diagnosis of SARS-CoV-2 by FDA under an Emergency Use Authorization (EUA). This EUA will remain  in effect (meaning this test can be used) for the duration of the COVID-19 declaration under Section 56 4(b)(1) of the Act, 21 U.S.C. section 360bbb-3(b)(1), unless the authorization is terminated or  revoked sooner. Performed at [facility], [place] [address] [place], [place] [address]          Radiology Studies: No results found.      Scheduled Meds: . amLODipine  5 mg Oral Daily  . [START ON 10/22/2019] aspirin  81 mg Oral Pre-Cath  . atenolol  25 mg Oral Daily  . FLUoxetine  20 mg Oral Daily  . levothyroxine  112 mcg Oral Q0600  . potassium chloride  40 mEq Oral BID  . rosuvastatin  20 mg Oral Daily  . sodium chloride flush  3 mL Intravenous Once  . vitamin B-12  100 mcg Oral Daily   Continuous Infusions: . sodium chloride    . heparin Stopped (10/21/19 1518)     LOS: 4 days    Time spent: 34min    Domenic Polite, MD [facility] Page via [email or web address], password [email or web address] After 7PM please contact night-coverage  10/21/2019, 3:22 PM

## 2019-10-21 NOTE — Significant Event (Signed)
Patient lost IV access occluded with redness at site. Attempted to insert IV. IV team consulted

## 2019-10-21 NOTE — Progress Notes (Signed)
Patient has questions about Cath paged cardiologist to make aware.

## 2019-10-21 NOTE — Progress Notes (Signed)
ANTICOAGULATION CONSULT NOTE - [place] for heparin Indication: atrial fibrillation  Allergies  Allergen Reactions  . Ciprofloxacin Other (See Comments)    Unknown  . Latex Other (See Comments)    Unknown  . Levofloxacin Other (See Comments)    Unknown  . Macrobid [employer] Macro] Other (See Comments)    Unknown  . Nitrofurantoin Other (See Comments)    Unknown  . Sulfa Antibiotics Other (See Comments)    Unknown    Patient Measurements: Height: 5\' 8"  (172.7 cm) Weight: 201 lb 4.8 oz (91.3 kg) IBW/kg (Calculated) : 63.9 Heparin Dosing Weight: 83 kg  Vital Signs: Temp: 98.5 F (36.9 C) (11/16 0738) Temp Source: Oral (11/16 0738) BP: 134/68 (11/16 0738) Pulse Rate: 61 (11/16 0738)  Labs: Recent Labs    10/20/19 0916 10/20/19 1911 10/21/19 0544  HGB  --   --  13.3  HCT  --   --  39.0  PLT  --   --  186  APTT 31 95* 159*  HEPARINUNFRC >2.20*  --  1.72*  CREATININE 1.14*  --  1.36*    Estimated Creatinine Clearance: 34.5 mL/min (A) (by C-G formula based on SCr of 1.36 mg/dL (H)).   Medical History: Past Medical History:  Diagnosis Date  . Anxiety   . Aortic regurgitation   . Atrial fibrillation ([place])   . Gastritis   . GERD (gastroesophageal reflux disease)   . H/O artificial eye lens   . Hypertension   . Hypothyroid   . Left ventricular hypertrophy   . Major depression   . Osteoarthritis   . Osteoporosis   . Peptic ulcer disease   . Stress incontinence     Medications:  Infusions:  . heparin 1,150 Units/hr (10/21/19 0357)    Assessment: 67 yof with a history of a fib (CHADSVASC = 4 (female, age, CHF, HTN)) on Eliquis PTA, last dose was 11/14 at 2200. Pharmacy consulted to dose heparin and hold Eliquis as patient is going to have cardiac cath done.   Heparin level this am still elevated due to recent apixaban. Aptt now above goal at 159s. No bleeding issues noted, cbc stable. Will adjust rate.   Goal of  Therapy:  Heparin level 0.3-0.7 units/ml Target aPTT 66-102 seconds  Monitor platelets by anticoagulation protocol: Yes   Plan:  -Reduce heparin to 900 units/hr -Recheck aPTT this evening   Erin Hearing PharmD., BCPS Clinical Pharmacist 10/21/2019 8:59 AM

## 2019-10-21 NOTE — Progress Notes (Signed)
Physical Therapy Treatment Patient Details Name: Faith Parker MRN: [id number] DOB: Jan 29, 1930 Today's Date: 10/21/2019    History of Present Illness 85 year old female history of paroxysmal atrial fibrillation and pauses status post pacemaker, ascending aortic aneurysm, hypothyroidism, history of recurrent UTI presented to the emergency room overnight with worsening dyspnea on exertion for the last few weeks, in addition also reports chronic arthritis admitted back pain radiating to the right side.    PT Comments    Pt tolerated treatment well, requiring minimal physical assistance for bed mobility but is otherwise independent with use of RW. Pt tolerates increased ambulation distances and demonstrates much improved transfer quality. Pt will benefit from continued acute PT POC to improve bed mobility and aide in a complete return to baseline.   Follow Up Recommendations  No PT follow up     Equipment Recommendations  None recommended by PT    Recommendations for Other Services       Precautions / Restrictions Precautions Precautions: Fall Restrictions Weight Bearing Restrictions: No    Mobility  Bed Mobility Overal bed mobility: Needs Assistance Bed Mobility: Supine to Sit     Supine to sit: Min assist;HOB elevated        Transfers Overall transfer level: Modified independent Equipment used: Rolling walker (2 wheeled) Transfers: Sit to/from Stand Sit to Stand: Modified independent (Device/Increase time)            Ambulation/Gait Ambulation/Gait assistance: Modified independent (Device/Increase time) Gait Distance (Feet): 200 Feet Assistive device: Rolling walker (2 wheeled) Gait Pattern/deviations: Step-through pattern Gait velocity: functional Gait velocity interpretation: 1.31 - 2.62 ft/sec, indicative of limited community ambulator General Gait Details: steady step through gait, pt does bump into cart in hallway but is able to correct  easily   Stairs             Wheelchair Mobility    Modified Rankin (Stroke Patients Only)       Balance Overall balance assessment: Modified Independent                                          Cognition Arousal/Alertness: Awake/alert Behavior During Therapy: WFL for tasks assessed/performed Overall Cognitive Status: Within Functional Limits for tasks assessed                                        Exercises      General Comments General comments (skin integrity, edema, etc.): VSS, daughter present during session      Pertinent Vitals/Pain Pain Assessment: 0-10 Pain Score: 2  Pain Location: back Pain Descriptors / Indicators: Sore Pain Intervention(s): Limited activity within patient's tolerance    Home Living                      Prior Function            PT Goals (current goals can now be found in the care plan section) Acute Rehab PT Goals Patient Stated Goal: to return to independent living Progress towards PT goals: Progressing toward goals    Frequency    Min 3X/week      PT Plan Current plan remains appropriate    Co-evaluation              AM-PAC PT "6 Clicks" Mobility  Outcome Measure  Help needed turning from your back to your side while in a flat bed without using bedrails?: A Little Help needed moving from lying on your back to sitting on the side of a flat bed without using bedrails?: A Little Help needed moving to and from a bed to a chair (including a wheelchair)?: None Help needed standing up from a chair using your arms (e.g., wheelchair or bedside chair)?: None Help needed to walk in hospital room?: None Help needed climbing 3-5 steps with a railing? : A Little 6 Click Score: 21    End of Session Equipment Utilized During Treatment: (none) Activity Tolerance: Patient tolerated treatment well Patient left: in bed;with call bell/phone within reach;with family/visitor  present Nurse Communication: Mobility status PT Visit Diagnosis: Muscle weakness (generalized) (M62.81)     Time: [id number] PT Time Calculation (min) (ACUTE ONLY): 15 min  Charges:  $Therapeutic Activity: 8-22 mins                     Zenaida Niece, PT, DPT Acute Rehabilitation Pager: [phone]    Zenaida Niece 10/21/2019, 2:59 PM

## 2019-10-21 NOTE — Progress Notes (Signed)
ANTICOAGULATION CONSULT NOTE - [place] for heparin Indication: atrial fibrillation  Allergies  Allergen Reactions  . Ciprofloxacin Other (See Comments)    Unknown  . Latex Other (See Comments)    Unknown  . Levofloxacin Other (See Comments)    Unknown  . Macrobid [employer] Macro] Other (See Comments)    Unknown  . Nitrofurantoin Other (See Comments)    Unknown  . Sulfa Antibiotics Other (See Comments)    Unknown    Patient Measurements: Height: 5\' 8"  (172.7 cm) Weight: 201 lb 4.8 oz (91.3 kg) IBW/kg (Calculated) : 63.9 Heparin Dosing Weight: 83 kg  Vital Signs: Temp: 98.4 F (36.9 C) (11/16 1231) Temp Source: Oral (11/16 1231) BP: 113/67 (11/16 1231) Pulse Rate: 60 (11/16 1231)  Labs: Recent Labs    10/20/19 0916 10/20/19 1911 10/21/19 0544 10/21/19 1837  HGB  --   --  13.3  --   HCT  --   --  39.0  --   PLT  --   --  186  --   APTT 31 95* 159* 48*  HEPARINUNFRC >2.20*  --  1.72*  --   CREATININE 1.14*  --  1.36*  --     Estimated Creatinine Clearance: 34.5 mL/min (A) (by C-G formula based on SCr of 1.36 mg/dL (H)).   Medical History: Past Medical History:  Diagnosis Date  . Anxiety   . Aortic regurgitation   . Atrial fibrillation ([place])   . Gastritis   . GERD (gastroesophageal reflux disease)   . H/O artificial eye lens   . Hypertension   . Hypothyroid   . Left ventricular hypertrophy   . Major depression   . Osteoarthritis   . Osteoporosis   . Peptic ulcer disease   . Stress incontinence     Medications:  Infusions:  . sodium chloride 75 mL/hr at 10/21/19 1619  . heparin 900 Units/hr (10/21/19 1619)    Assessment: 36 yof with a history of a fib (CHADSVASC = 4 (female, age, CHF, HTN)) on Eliquis PTA, last dose was 11/14 at 2200. Pharmacy consulted to dose heparin and hold Eliquis, as patient is going to have cardiac cath done.   Heparin level this morning was still elevated (1.72 units/ml) due to  recent apixaban. aPTT this morning was above goal at 159 sec. CBC stable. Heparin rate was decreased to 900 units/hr. However, per RN, pt lost IV access ~1500 and heparin infusion was restarted at ~1620 this afternoon.  aPTT drawn ~8 hrs after heparin infusion was decreased to 900 units/hr was 48 sec, which is below the desired goal range. However, as noted above, pt's heparin IV was off for ~1 hr 20 mins this afternoon, so aPTT value does not reflect steady state at heparin infusion rate of 900 units/hr.   Per RN, no bleeding or any further issues with IV.  Goal of Therapy:  Heparin level 0.3-0.7 units/ml Target aPTT 66-102 seconds  Monitor platelets by anticoagulation protocol: Yes   Plan:  Continue heparin infusion at 900 units/hr Recheck aPTT 8 hrs after infusion restarted after being off this afternoon  Monitor daily aPTT, heparin level, CBC Monitor for signs/symptoms of bleeding  Gillermina Hu, PharmD, BCPS, [facility] Clinical Pharmacist 10/21/2019 7:45 PM

## 2019-10-21 NOTE — Care Management Important Message (Signed)
Important Message  Patient Details  Name: Faith Parker MRN: [id number] Date of Birth: [birth year]   Medicare Important Message Given:  Yes     Tonie, Ratte 10/21/2019, 1:08 PM

## 2019-10-21 NOTE — Plan of Care (Signed)
  Problem: Safety: Goal: Ability to remain free from injury will improve Outcome: Progressing   

## 2019-10-21 NOTE — Progress Notes (Addendum)
Progress Note  Patient Name: Faith Parker Date of Encounter: 10/21/2019  Primary Cardiologist: Dorris Carnes, MD   Subjective   Patient says DOE has moderately improved. No CP. Patient has been up and about her room and feels OK. Plan for cardiac cath tomorrow.   Inpatient Medications    Scheduled Meds: . amLODipine  5 mg Oral Daily  . [START ON 10/22/2019] aspirin  81 mg Oral Pre-Cath  . atenolol  25 mg Oral Daily  . FLUoxetine  20 mg Oral Daily  . levothyroxine  112 mcg Oral Q0600  . potassium chloride  40 mEq Oral BID  . rosuvastatin  20 mg Oral Daily  . sodium chloride flush  3 mL Intravenous Once  . vitamin B-12  100 mcg Oral Daily   Continuous Infusions: . heparin     PRN Meds: acetaminophen **OR** acetaminophen, ALPRAZolam, alum & mag hydroxide-simeth, labetalol, ondansetron **OR** ondansetron (ZOFRAN) IV   Vital Signs    Vitals:   10/20/19 1502 10/20/19 1952 10/21/19 0550 10/21/19 0738  BP: 125/71 124/68 108/74 134/68  Pulse: 62 62 61 61  Resp:  18 18 14   Temp: 98.3 F (36.8 C) 99.2 F (37.3 C) 98 F (36.7 C) 98.5 F (36.9 C)  TempSrc: Oral Oral  Oral  SpO2: 95% 96% 98% 99%  Weight:   91.3 kg   Height:        Intake/Output Summary (Last 24 hours) at 10/21/2019 0950 Last data filed at 10/21/2019 0900 Gross per 24 hour  Intake 905.6 ml  Output 950 ml  Net -44.4 ml   Last 3 Weights 10/21/2019 10/20/2019 10/19/2019  Weight (lbs) 201 lb 4.8 oz 200 lb 4.8 oz 200 lb 4.8 oz  Weight (kg) 91.309 kg 90.855 kg 90.855 kg      Telemetry    Atrial paced rhythm; NSR rhythm HR in the 60s - Personally Reviewed  ECG    NO new - Personally Reviewed  Physical Exam   GEN: No acute distress.   Neck: No JVD Cardiac: RRR, no murmurs, rubs, or gallops.  Respiratory: Clear to auscultation bilaterally. GI: Soft, nontender, non-distended  MS: No edema; No deformity. Neuro:  Nonfocal  Psych: Normal affect   Labs    High Sensitivity Troponin:   Recent  Labs  Lab 10/16/19 1605 10/16/19 2311 10/17/19 0325 10/17/19 0557  TROPONINIHS 9 10 17  24*      Chemistry Recent Labs  Lab 10/17/19 0325  10/18/19 0449 10/20/19 0916 10/21/19 0544  NA 142   < > 138 137 140  K 2.9*   < > 3.5 4.0 3.8  CL 106   < > 106 107 106  CO2 23   < > 21* 21* 21*  GLUCOSE 115*   < > 115* 122* 99  BUN 15   < > 14 16 19   CREATININE 1.25*   < > 1.07* 1.14* 1.36*  CALCIUM 8.9   < > 8.6* 9.0 8.9  PROT 6.4*  --   --   --   --   ALBUMIN 3.7  --   --   --   --   AST 21  --   --   --   --   ALT 20  --   --   --   --   ALKPHOS 52  --   --   --   --   BILITOT 1.0  --   --   --   --  GFRNONAA 39*   < > 47* 43* 35*  GFRAA 45*   < > 54* 50* 40*  ANIONGAP 13   < > 11 9 13    < > = values in this interval not displayed.     Hematology Recent Labs  Lab 10/17/19 0325 10/18/19 0449 10/21/19 0544  WBC 7.0 6.7 6.7  RBC 4.36 4.35 4.29  HGB 13.5 13.4 13.3  HCT 40.2 40.0 39.0  MCV 92.2 92.0 90.9  MCH 31.0 30.8 31.0  MCHC 33.6 33.5 34.1  RDW 13.4 13.3 13.4  PLT 194 196 186    BNP Recent Labs  Lab 10/17/19 0325  BNP 189.0*     DDimer No results for input(s): DDIMER in the last 168 hours.   Radiology    Nm Myocar Multi W/spect W/wall Motion / Ef  Result Date: 10/19/2019  There was no ST segment deviation noted during stress.  Defect 1: There is a large defect of moderate severity present in the basal inferoseptal, mid anteroseptal, mid inferoseptal and apical septal location.  Findings consistent with ischemia.  This is an intermediate risk study.  Nuclear stress EF: 61%.  Abnormal TID ratio of 1.67  Large size, moderate severity inferoseptal, septal and apical anteroseptal perfusion defect c/w ischemia (SDS 5). Abnormal TID ratio of 1.67. LVEF 61% with normal wall motion. This is an intermediate risk study. Clinical correlation advised.    Cardiac Studies   TTE: 10/17/2019  1. Left ventricular ejection fraction, by visual estimation, is 55 to  60%. The left ventricle has normal function. There is mildly increased left ventricular hypertrophy. 2. Mild aortic stenosis is present. Vmax 2.3 cm/s, mean gradient 11 mmHG, AVA 1.86 cm2. No change from prior study (06/04/2018). 3. Small left ventricular internal cavity size. 4. The left ventricle has no regional wall motion abnormalities. 5. Global right ventricle has normal systolic function.The right ventricular size is normal. No increase in right ventricular wall thickness. 6. Left atrial size was mildly dilated. 7. Right atrial size was normal. 8. Presence of pericardial fat pad. 9. The mitral valve is degenerative. Mild mitral valve regurgitation. 10. The tricuspid valve is grossly normal. Tricuspid valve regurgitation is trivial. 11. The aortic valve has an indeterminant number of cusps. Aortic valve regurgitation is mild. Mild aortic valve stenosis. 12. There is Severe calcifcation of the aortic valve. 13. There is Severely thickening of the aortic valve. 14. The pulmonic valve was grossly normal. Pulmonic valve regurgitation is not visualized. 15. Mild plaque invoving the ascending aorta. 16. Normal pulmonary artery systolic pressure. 17. The tricuspid regurgitant velocity is 2.34 m/s, and with an assumed right atrial pressure of 3 mmHg, the estimated right ventricular systolic pressure is normal at 24.9 mmHg. 18. A pacer wire is visualized in the RA and RV. 19. The inferior vena cava is normal in size with greater than 50% respiratory variability, suggesting right atrial pressure of 3 mmHg.  Patient Profile     85 y.o. female with a hx of Paroxysmal Afib, pauses s/p pacemaker, HTN, Aortic stenosis, ascending aortic aneurysm (4.1 cm 02/22/2018), hypothyroidism, and chronic UTIwho is being seen today for the evaluation of exertional dyspnea.She is a patient of Dr. Harrington Challenger and Dr. Caryl Comes, last seen in October 2019. She has history of paroxysmal A. fib with significant pauses  status post pacemaker placement, she has noted mild aortic stenosis and regurgitation based on echo in 2018 with hyperdynamic LVEF. Her shortness of breath is exertional and started about a week ago, no  associated chest pain orthopnea proximal nocturnal dyspnea. Her pacemaker was interrogated a week ago and showed A. fib burden of 5%.   Assessment & Plan    Exertional Dyspnea/Abnormal stress test -Patient reported DOE for 1 week. -CXR with no acute disease. -CT chest negative for PE andstable aortic aneurysm.  -Her troponins are negative.  -Chest CTA shows mild coronary calcifications and RCA and circumflex artery. -nuclear stress test was abnormal with possible ischemia in the inferoseptal, septal and apical anteroseptal walls with transient ischemic dilatation (TID 1.67) and normal wall motion. Given transient ischemic dilatation recommend proceeding with heart cath. - NPO tonight for cardiac cath tomorrow (needs Eliquis washout) . Creatinine is trending up >> Patient might need IVF prior to cath.  Risks and benefits of cardiac catheterization have been discussed with the patient.  These include bleeding, infection, kidney damage, stroke, heart attack, death.  The patient understands these risks and is willing to proceed.  Paroxysmal Afib -On arrival patient was in NSR>> wentinto Afib RVR, rates to the 120s. Rates now down to the 60s. Was on atenolol 25 mg daily for rate control  - maintaining NSR On tele - continue BB for rate control - CHADSVASC = 4 (female, age, CHF, HTN) - Hold Eliquis for cath - start IV Heparin per pharmacy - TSH 0.687 - Potassium 3.8 - Continue Telemetry  Chronic Diastolic HF - EF [id number] with preserved EF and G2DD - 2D echo with normal LVF, mild AS/AR and mild MR - BNP 189 - Has been euvolemic since admission - net neg 1.7L - creatinine 1.36 today - repeat BMET daily  Pauses s/p pacemaker - [employer] device - last interrogation 10/6/20showing  AT/AF 5.3%  UTI/lower back pain - abx stopped per IM  MildAortic Stenosis/AI -Stable on echo this admit  Ascending aortic aneurysm -Stable at 41 mm  HTN - BP controlled  - continue BB and amlodipine 5 mg po daily - ARB held due to AKI  AKI - creatinine1.49 on admission.baseline 0.86, . It has been going up over the last couple of days. 1.36 today - ARB held - continue to trend   For questions or updates, please contact [place] Please consult [email or web address] for contact info under        Signed, Cadence Ninfa Meeker, PA-C  10/21/2019, 9:50 AM    Personally seen and examined. Agree with above.   Currently without any chest discomfort  Alert and oriented, regular rate and rhythm, 2/6 systolic right upper sternal border murmur  Abnormal nuclear stress test/dyspnea on exertion -Awaiting cardiac catheterization tomorrow. -Hydration should start 8 PM tonight. -Holding any sort of renal impairing medications.  Essential hypertension -Controlled.  ARB held.  AKI -Gentle hydration overnight.  Answered several questions from daughter.  Candee Furbish, MD

## 2019-10-22 ENCOUNTER — Encounter (HOSPITAL_COMMUNITY): Payer: Self-pay | Admitting: Cardiovascular Disease

## 2019-10-22 ENCOUNTER — Encounter (HOSPITAL_COMMUNITY)
Admission: EM | Disposition: A | Discharge status: Home / Self Care | Payer: Self-pay | Source: Ambulatory Visit | Attending: Internal Medicine

## 2019-10-22 DIAGNOSIS — R931 Abnormal findings on diagnostic imaging of heart and coronary circulation: Secondary | ICD-10-CM

## 2019-10-22 HISTORY — PX: LEFT HEART CATH AND CORONARY ANGIOGRAPHY: CATH118249

## 2019-10-22 LAB — CBC
HCT: 40 % (ref 36.0–46.0)
Hemoglobin: 13.2 g/dL (ref 12.0–15.0)
MCH: 30.6 pg (ref 26.0–34.0)
MCHC: 33 g/dL (ref 30.0–36.0)
MCV: 92.6 fL (ref 80.0–100.0)
Platelets: 193 10*3/uL (ref 150–400)
RBC: 4.32 MIL/uL (ref 3.87–5.11)
RDW: 13.3 % (ref 11.5–15.5)
WBC: 6.2 10*3/uL (ref 4.0–10.5)
nRBC: 0 % (ref 0.0–0.2)

## 2019-10-22 LAB — BASIC METABOLIC PANEL
Anion gap: 12 (ref 5–15)
BUN: 22 mg/dL (ref 8–23)
CO2: 19 mmol/L — ABNORMAL LOW (ref 22–32)
Calcium: 8.6 mg/dL — ABNORMAL LOW (ref 8.9–10.3)
Chloride: 108 mmol/L (ref 98–111)
Creatinine, Ser: 1.24 mg/dL — ABNORMAL HIGH (ref 0.44–1.00)
GFR calc Af Amer: 45 mL/min — ABNORMAL LOW (ref >60)
GFR calc non Af Amer: 39 mL/min — ABNORMAL LOW (ref >60)
Glucose, Bld: 102 mg/dL — ABNORMAL HIGH (ref 70–99)
Potassium: 4.3 mmol/L (ref 3.5–5.1)
Sodium: 139 mmol/L (ref 135–145)

## 2019-10-22 LAB — HEPARIN LEVEL (UNFRACTIONATED): Heparin Unfractionated: 1.04 IU/mL — ABNORMAL HIGH (ref 0.30–0.70)

## 2019-10-22 LAB — APTT
aPTT: 66 seconds — ABNORMAL HIGH (ref 24–36)
aPTT: 58 seconds — ABNORMAL HIGH (ref 24–36)

## 2019-10-22 SURGERY — LEFT HEART CATH AND CORONARY ANGIOGRAPHY
Anesthesia: LOCAL

## 2019-10-22 MED ORDER — MIDAZOLAM HCL 2 MG/2ML IJ SOLN
INTRAMUSCULAR | Status: DC | PRN
  Administered 2019-10-22: 1 mg via INTRAVENOUS
  Administered 2019-10-22: 0.5 mg via INTRAVENOUS

## 2019-10-22 MED ORDER — SODIUM CHLORIDE 0.9% FLUSH: 3.0000 mL | Freq: Two times a day (BID) | INTRAVENOUS | Status: DC

## 2019-10-22 MED ORDER — VERAPAMIL HCL 2.5 MG/ML IV SOLN
INTRAVENOUS | Status: AC
  Filled 2019-10-22: qty 2

## 2019-10-22 MED ORDER — MIDAZOLAM HCL 2 MG/2ML IJ SOLN
INTRAMUSCULAR | Status: AC
  Filled 2019-10-22: qty 2

## 2019-10-22 MED ORDER — FENTANYL CITRATE (PF) 100 MCG/2ML IJ SOLN
INTRAMUSCULAR | Status: AC
  Filled 2019-10-22: qty 2

## 2019-10-22 MED ORDER — LIDOCAINE HCL (PF) 1 % IJ SOLN
INTRAMUSCULAR | Status: AC
  Filled 2019-10-22: qty 30

## 2019-10-22 MED ORDER — HEPARIN (PORCINE) IN NACL 1000-0.9 UT/500ML-% IV SOLN
INTRAVENOUS | Status: DC | PRN
  Administered 2019-10-22 (×2): 500 mL

## 2019-10-22 MED ORDER — HEPARIN SODIUM (PORCINE) 1000 UNIT/ML IJ SOLN
INTRAMUSCULAR | Status: DC | PRN
  Administered 2019-10-22: 5000 [IU] via INTRAVENOUS

## 2019-10-22 MED ORDER — VERAPAMIL HCL 2.5 MG/ML IV SOLN
INTRAVENOUS | Status: DC | PRN
  Administered 2019-10-22: 10 mL via INTRA_ARTERIAL

## 2019-10-22 MED ORDER — SODIUM CHLORIDE 0.9 % WEIGHT BASED INFUSION
1.0000 mL/kg/h | INTRAVENOUS | Status: DC
  Administered 2019-10-22: 1 mL/kg/h via INTRAVENOUS

## 2019-10-22 MED ORDER — SODIUM CHLORIDE 0.9 % IV SOLN: 250.0000 mL | INTRAVENOUS | Status: DC | PRN

## 2019-10-22 MED ORDER — HYDRALAZINE HCL 20 MG/ML IJ SOLN: 10.0000 mg | INTRAMUSCULAR | Status: DC | PRN

## 2019-10-22 MED ORDER — IOHEXOL 350 MG/ML SOLN
INTRAVENOUS | Status: DC | PRN
  Administered 2019-10-22: 40 mL

## 2019-10-22 MED ORDER — SODIUM CHLORIDE 0.9% FLUSH: 3.0000 mL | INTRAVENOUS | Status: DC | PRN

## 2019-10-22 MED ORDER — LIDOCAINE HCL (PF) 1 % IJ SOLN
INTRAMUSCULAR | Status: DC | PRN
  Administered 2019-10-22: 2 mL

## 2019-10-22 MED ORDER — FENTANYL CITRATE (PF) 100 MCG/2ML IJ SOLN
INTRAMUSCULAR | Status: DC | PRN
  Administered 2019-10-22 (×2): 25 ug via INTRAVENOUS

## 2019-10-22 MED ORDER — APIXABAN 5 MG PO TABS: 5.0000 mg | ORAL_TABLET | Freq: Two times a day (BID) | ORAL | 0 refills | Status: DC

## 2019-10-22 MED ORDER — APIXABAN 5 MG PO TABS: 5.0000 mg | ORAL_TABLET | Freq: Two times a day (BID) | ORAL | Status: DC

## 2019-10-22 MED ORDER — HEPARIN SODIUM (PORCINE) 1000 UNIT/ML IJ SOLN
INTRAMUSCULAR | Status: AC
  Filled 2019-10-22: qty 1

## 2019-10-22 MED ORDER — HEPARIN (PORCINE) IN NACL 1000-0.9 UT/500ML-% IV SOLN
INTRAVENOUS | Status: AC
  Filled 2019-10-22: qty 1000

## 2019-10-22 MED ORDER — LOSARTAN POTASSIUM 100 MG PO TABS: 50.0000 mg | ORAL_TABLET | Freq: Every day | ORAL | Status: DC

## 2019-10-22 SURGICAL SUPPLY — 9 items
CATH 5FR JL3.5 JR4 ANG PIG MP (CATHETERS) ×2 IMPLANT
DEVICE RAD COMP TR BAND LRG (VASCULAR PRODUCTS) ×2 IMPLANT
GLIDESHEATH SLEND SS 6F .021 (SHEATH) ×2 IMPLANT
GUIDEWIRE INQWIRE 1.5J.035X260 (WIRE) ×1 IMPLANT
INQWIRE 1.5J .035X260CM (WIRE) ×2
KIT HEART LEFT (KITS) ×2 IMPLANT
PACK CARDIAC CATHETERIZATION (CUSTOM PROCEDURE TRAY) ×2 IMPLANT
TRANSDUCER W/STOPCOCK (MISCELLANEOUS) ×2 IMPLANT
TUBING CIL FLEX 10 FLL-RA (TUBING) ×2 IMPLANT

## 2019-10-22 NOTE — Discharge Summary (Signed)
Physician Discharge Summary  GENISHA LAZUR [id number] DOB: 1929-12-30 DOA: 10/16/2019  PCP: Leighton Ruff, MD  Admit date: 10/16/2019 Discharge date: 10/22/2019  Time spent: 35 minutes  Recommendations for Outpatient Follow-up:  PCP in 1 week Left upper lobe lung nodule, consider follow-up after risk-benefit discussion with advanced age  Discharge Diagnoses:  Dyspnea on exertion   Hypothyroidism   Thoracic aortic aneurysm [facility]): a. 4.1 cm ascending thoracic aortic aneurysm 01/23/17   PAF (paroxysmal atrial fibrillation) (HCC)   DOE (dyspnea on exertion)   ARF (acute renal failure) (HCC)   Acute lower UTI   Abnormal nuclear cardiac imaging test   Discharge Condition: Stable  Diet recommendation: Heart healthy  Filed Weights   10/20/19 0423 10/21/19 0550 10/22/19 0534  Weight: 90.9 kg 91.3 kg 92.6 kg    History of present illness:  85 year old female history of paroxysmal atrial fibrillation and pauses status post pacemaker, ascending aortic aneurysm, hypothyroidism, history of recurrent UTI presented to the emergency room overnight with worsening dyspnea on exertion for the last few weeks, in addition also reports chronic arthritis admitted back pain radiating to the right side, she felt more dyspneic and diaphoretic last night and hence presented to the emergency room,. -In the ED she was noted to be afebrile initial EKG showed normal sinus rhythm, CT angiogram was negative for PE noted left upper lobe nodule and ascending aortic aneurysm, UA was abnormal, high-sensitivity troponin was negative, subsequently had a brief episode of A. fib RVR   Hospital Course:   Dyspnea on exertion /abnormal stress test -Presented with worsening dyspnea on exertion reportedly ongoing for 1,  to 2 weeks -Chest x-ray unremarkable, CT angiogram negative for PE, showed stable aortic aneurysm -No evidence of fluid overload  -2D echocardiogram is unremarkable, only mild AS  noted -Cardiology consulted underwent stress test  which is abnormal, concerning for inducible ischemia in multiple territories, was an intermediate risk study -Subsequently left heart cath was planned, Eliquis had to be held for 48 hours prior to this, the study was completed today and noted minimal nonobstructive CAD -No further work-up was felt to be indicated, discharged home in a stable condition she can restart her Eliquis tomorrow  Paroxysmal atrial fibrillation,pauses, pacemaker -Brief RVR on admission, heart rate controlled since -Continue atenolol, Eliquis held for cath, restart tomorrow  Chronic diastolic CHF -Preserved EF, diastolic dysfunction, remains euvolemic throughout this admission  Possible Enterobacter UTI -No overt symptoms reported, completed 3 days of ceftriaxone to which it has intermediate sensitivity, no symptoms, all antibiotics stopped yesterday  Ascending aortic aneurysm -Stable  Lung nodule,left upper lobe -Consider follow-up, will defer to PCP given advanced age  Code Status: DNR per pt and daughter  Discharge Exam: Vitals:   10/22/19 1501 10/22/19 1601  BP: (!) 162/74 137/61  Pulse: 62 (!) 59  Resp:    Temp:    SpO2:      General: AAOx3 Cardiovascular: S1S2/RRR Respiratory: CTAB  Discharge Instructions   Discharge Instructions    Diet - low sodium heart healthy   Complete by: As directed    Increase activity slowly   Complete by: As directed      Allergies as of 10/22/2019      Reactions   Ciprofloxacin Other (See Comments)   Unknown   Latex Other (See Comments)   Unknown   Levofloxacin Other (See Comments)   Unknown   Macrobid [nitrofurantoin Monohyd Macro] Other (See Comments)   Unknown   Nitrofurantoin Other (See Comments)  Unknown   Sulfa Antibiotics Other (See Comments)   Unknown      Medication List    STOP taking these medications   cephALEXin 250 MG capsule Commonly known as: KEFLEX     TAKE these  medications   ALIGN PO Take 1 capsule by mouth daily.   ALPRAZolam 0.5 MG tablet Commonly known as: XANAX Take 0.5 mg by mouth 3 (three) times daily as needed for anxiety.   apixaban 5 MG Tabs tablet Commonly known as: Eliquis Take 1 tablet (5 mg total) by mouth 2 (two) times daily. Restart tomorrow Start taking on: October 23, 2019 What changed: additional instructions   atenolol 25 MG tablet Commonly known as: TENORMIN Take 25 mg by mouth daily.   Fish Oil 1200 MG Caps Take 4 capsules by mouth daily.   FLUoxetine 20 MG tablet Commonly known as: PROZAC Take 20 mg by mouth daily.   levothyroxine 100 MCG tablet Commonly known as: SYNTHROID Take 100 mcg by mouth daily before breakfast.   losartan 100 MG tablet Commonly known as: COZAAR Take 0.5 tablets (50 mg total) by mouth daily. Start taking on: October 23, 2019 What changed: how much to take   MULTIVITAMIN PO Take 1 tablet by mouth daily.   rosuvastatin 20 MG tablet Commonly known as: CRESTOR TAKE 1 TABLET BY MOUTH DAILY.   Systane 0.4-0.3 % Soln Generic drug: Polyethyl Glycol-Propyl Glycol Place 1 drop into both eyes at bedtime.   traMADol 50 MG tablet Commonly known as: ULTRAM Take 50 mg by mouth every 6 (six) hours as needed for moderate pain.   vitamin B-12 100 MCG tablet Commonly known as: CYANOCOBALAMIN Take 100 mcg by mouth daily.   vitamin C 500 MG tablet Commonly known as: ASCORBIC ACID Take 500 mg by mouth daily.   Vitamin D3 125 MCG (5000 UT) Tabs Take 5,000 Units by mouth daily.      Allergies  Allergen Reactions  . Ciprofloxacin Other (See Comments)    Unknown  . Latex Other (See Comments)    Unknown  . Levofloxacin Other (See Comments)    Unknown  . Macrobid [employer] Macro] Other (See Comments)    Unknown  . Nitrofurantoin Other (See Comments)    Unknown  . Sulfa Antibiotics Other (See Comments)    Unknown   Follow-up Information    Leighton Ruff, MD.  Go on 11/04/2019.   Specialty: Family Medicine Why: @10 :Max Sane information: [place] [address] [phone]        Fay Records, MD. Go on 11/15/2019.   Specialty: Cardiology Why: @1 :20pm Contact information: [place] [address] [phone]            The results of significant diagnostics from this hospitalization (including imaging, microbiology, ancillary and laboratory) are listed below for reference.    Significant Diagnostic Studies: Dg Chest 2 View  Result Date: 10/16/2019 CLINICAL DATA:  Shortness of breath. EXAM: CHEST - 2 VIEW COMPARISON:  Slice second [id number] FINDINGS: There is a dual chamber left-sided pacemaker in place. The heart size is normal. Aortic calcifications are again noted. There is some mild blunting of the costophrenic angles which is similar to prior study. The lungs are hyperexpanded. There is no acute osseous abnormality. No pneumothorax. A hiatal hernia is noted. There is a calcified granuloma in the left upper lobe. IMPRESSION: 1. No acute abnormality. 2. Moderate-sized hiatal hernia. Electronically Signed   By: Jamie Kato.D.  On: 10/16/2019 16:54   Ct Angio Chest Pe W And/or Wo Contrast  Result Date: 10/16/2019 CLINICAL DATA:  Shortness of breath, diaphoresis EXAM: CT ANGIOGRAPHY CHEST WITH CONTRAST TECHNIQUE: Multidetector CT imaging of the chest was performed using the standard protocol during bolus administration of intravenous contrast. Multiplanar CT image reconstructions and MIPs were obtained to evaluate the vascular anatomy. CONTRAST:  46mL OMNIPAQUE IOHEXOL 350 MG/ML SOLN COMPARISON:  Radiograph same day, CT chest February 22, 2018 he FINDINGS: Cardiovascular: There is a optimal opacification of the pulmonary arteries. There is no central,segmental, or subsegmental filling defects within the pulmonary arteries. There is mild cardiomegaly with left ventricular hypertrophy.  Aortic valve calcifications are noted. Again noted is aneurysmal dilatation of the ascending intrathoracic aorta measuring 4.1 cm which tapers at the level of the aortic arch. Scattered aortic atherosclerotic calcifications are seen. Mediastinum/Nodes: No hilar, mediastinal, or axillary adenopathy. There is a moderate hiatal hernia seen. Lungs/Pleura: There is a small ill-defined nodular opacity in the anterior left upper lobe measuring 2.4 cm, series 6, image 80. A small amount of streaky/nodular opacity seen at the right lung base. No other large airspace consolidation or pleural effusion. Upper Abdomen: No acute abnormalities present in the visualized portions of the upper abdomen. Musculoskeletal: No chest wall abnormality. No acute or significant osseous findings. A left-sided pacemaker seen with the lead tips right atrium right ventricle. Again noted are slight superior compression deformities of the T6 and T7 vertebral bodies as on prior exam. Review of the MIP images confirms the above findings. IMPRESSION: 1.  No central, segmental, or subsegmental pulmonary embolism. 2. Stable intrathoracic ascending aortic aneurysm measuring 4.1 cm in transverse dimension. 3. Ill-defined nodular opacity in the anterior left upper lobe measuring 2.4 cm which could represent a focus of atelectasis or early infectious etiology. Followup PA and lateral chest X-ray is recommended in 3-4 weeks following trial of antibiotic therapy to ensure resolution and exclude underlying malignancy. 4. Moderate hiatal hernia 5. Chronic superior compression deformities of the midthoracic spine. Electronically Signed   By: Prudencio Pair M.D.   On: 10/16/2019 23:42   Nm Myocar Multi W/spect W/wall Motion / Ef  Result Date: 10/19/2019  There was no ST segment deviation noted during stress.  Defect 1: There is a large defect of moderate severity present in the basal inferoseptal, mid anteroseptal, mid inferoseptal and apical septal  location.  Findings consistent with ischemia.  This is an intermediate risk study.  Nuclear stress EF: 61%.  Abnormal TID ratio of 1.67  Large size, moderate severity inferoseptal, septal and apical anteroseptal perfusion defect c/w ischemia (SDS 5). Abnormal TID ratio of 1.67. LVEF 61% with normal wall motion. This is an intermediate risk study. Clinical correlation advised.    Microbiology: Recent Results (from the past 240 hour(s))  Urine culture     Status: Abnormal   Collection Time: 10/16/19 10:15 PM   Specimen: Urine, Catheterized  Result Value Ref Range Status   Specimen Description URINE, CATHETERIZED  Final   Special Requests   Final    NONE Performed at [facility], [address] [place], [place] [address]    Culture >=100,000 COLONIES/mL ENTEROBACTER CLOACAE (A)  Final   Report Status 10/19/2019 FINAL  Final   Organism ID, Bacteria ENTEROBACTER CLOACAE (A)  Final      Susceptibility   Enterobacter cloacae - MIC*    CEFAZOLIN >=64 RESISTANT Resistant     CEFTRIAXONE 16 INTERMEDIATE Intermediate     CIPROFLOXACIN <=  0.25 SENSITIVE Sensitive     GENTAMICIN <=1 SENSITIVE Sensitive     IMIPENEM 1 SENSITIVE Sensitive     NITROFURANTOIN 32 SENSITIVE Sensitive     TRIMETH/SULFA <=20 SENSITIVE Sensitive     PIP/TAZO 64 INTERMEDIATE Intermediate     * >=100,000 COLONIES/mL ENTEROBACTER CLOACAE  SARS CORONAVIRUS 2 (TAT 6-24 HRS) Nasopharyngeal Nasopharyngeal Swab     Status: None   Collection Time: 10/17/19 12:31 AM   Specimen: Nasopharyngeal Swab  Result Value Ref Range Status   SARS Coronavirus 2 NEGATIVE NEGATIVE Final    Comment: (NOTE) SARS-CoV-2 target nucleic acids are NOT DETECTED. The SARS-CoV-2 RNA is generally detectable in upper and lower respiratory specimens during the acute phase of infection. Negative results do not preclude SARS-CoV-2 infection, do not rule out co-infections with other pathogens, and should not be used as the sole basis for  treatment or other patient management decisions. Negative results must be combined with clinical observations, patient history, and epidemiological information. The expected result is Negative. Fact Sheet for Patients: SugarRoll.be Fact Sheet for Healthcare Providers: https://www.woods-mathews.com/ This test is not yet approved or cleared by the [place] FDA and  has been authorized for detection and/or diagnosis of SARS-CoV-2 by FDA under an Emergency Use Authorization (EUA). This EUA will remain  in effect (meaning this test can be used) for the duration of the COVID-19 declaration under Section 56 4(b)(1) of the Act, 21 U.S.C. section 360bbb-3(b)(1), unless the authorization is terminated or revoked sooner. Performed at [facility], [place] [address] [place], [place] [address]      Labs: Basic Metabolic Panel: Recent Labs  Lab 10/17/19 0325 10/17/19 1038 10/18/19 0449 10/20/19 0916 10/21/19 0544 10/22/19 0547  NA 142 139 138 137 140 139  K 2.9* 3.6 3.5 4.0 3.8 4.3  CL 106 108 106 107 106 108  CO2 23 22 21* 21* 21* 19*  GLUCOSE 115* 122* 115* 122* 99 102*  BUN 15 13 14 16 19 22   CREATININE 1.25* 1.20* 1.07* 1.14* 1.36* 1.24*  CALCIUM 8.9 8.4* 8.6* 9.0 8.9 8.6*  MG 2.1  --   --   --   --   --    Liver Function Tests: Recent Labs  Lab 10/17/19 0325  AST 21  ALT 20  ALKPHOS 52  BILITOT 1.0  PROT 6.4*  ALBUMIN 3.7   Recent Labs  Lab 10/17/19 0557  LIPASE 35   No results for input(s): AMMONIA in the last 168 hours. CBC: Recent Labs  Lab 10/16/19 1605 10/17/19 0325 10/18/19 0449 10/21/19 0544 10/22/19 0019  WBC 6.8 7.0 6.7 6.7 6.2  NEUTROABS  --  5.4  --   --   --   HGB 13.1 13.5 13.4 13.3 13.2  HCT 40.5 40.2 40.0 39.0 40.0  MCV 94.4 92.2 92.0 90.9 92.6  PLT 210 194 196 186 193   Cardiac Enzymes: No results for input(s): CKTOTAL, CKMB, CKMBINDEX, TROPONINI in the last 168 hours. BNP: BNP (last 3  results) Recent Labs    10/17/19 0325  BNP 189.0*    ProBNP (last 3 results) No results for input(s): PROBNP in the last 8760 hours.  CBG: No results for input(s): GLUCAP in the last 168 hours.     Signed:  Domenic Polite MD.  [facility] 10/22/2019, 5:07 PM

## 2019-10-22 NOTE — H&P (View-Only) (Signed)
Progress Note  Patient Name: Faith Parker Date of Encounter: 10/22/2019  Primary Cardiologist: Dorris Carnes, MD   Subjective   Plan for cath today. Creatinine improved. Patient feels SOB is improving. She has been ambulating with no significant SOB. No CP.  Inpatient Medications    Scheduled Meds: . amLODipine  5 mg Oral Daily  . atenolol  25 mg Oral Daily  . FLUoxetine  20 mg Oral Daily  . levothyroxine  112 mcg Oral Q0600  . polyethylene glycol  17 g Oral Daily  . potassium chloride  40 mEq Oral BID  . rosuvastatin  20 mg Oral Daily  . sodium chloride flush  3 mL Intravenous Once  . vitamin B-12  100 mcg Oral Daily   Continuous Infusions: . sodium chloride 75 mL/hr at 10/21/19 1900  . sodium chloride 1 mL/kg/hr (10/22/19 0151)  . heparin 900 Units/hr (10/21/19 1619)   PRN Meds: acetaminophen **OR** acetaminophen, ALPRAZolam, alum & mag hydroxide-simeth, labetalol, ondansetron **OR** ondansetron (ZOFRAN) IV   Vital Signs    Vitals:   10/21/19 1231 10/21/19 1959 10/22/19 0530 10/22/19 0534  BP: 113/67 128/68 (!) 141/67   Pulse: 60 60 64   Resp:  17 17   Temp: 98.4 F (36.9 C) 98.2 F (36.8 C) (!) 97.5 F (36.4 C)   TempSrc: Oral Oral Oral   SpO2: 97% (!) 89% 98%   Weight:    92.6 kg  Height:        Intake/Output Summary (Last 24 hours) at 10/22/2019 0846 Last data filed at 10/22/2019 0300 Gross per 24 hour  Intake 2237.29 ml  Output 1051 ml  Net 1186.29 ml   Last 3 Weights 10/22/2019 10/21/2019 10/20/2019  Weight (lbs) 204 lb 3.2 oz 201 lb 4.8 oz 200 lb 4.8 oz  Weight (kg) 92.625 kg 91.309 kg 90.855 kg      Telemetry    NSR and Atrial-paced rhythm; HR 60s - Personally Reviewed  ECG    No new - Personally Reviewed  Physical Exam   GEN: No acute distress.   Neck: No JVD Cardiac: RRR, systolic murmur, rubs, or gallops.  Respiratory: Clear to auscultation bilaterally. GI: Soft, nontender, non-distended  MS: No edema; No deformity. Neuro:   Nonfocal  Psych: Normal affect   Labs    High Sensitivity Troponin:   Recent Labs  Lab 10/16/19 1605 10/16/19 2311 10/17/19 0325 10/17/19 0557  TROPONINIHS 9 10 17  24*      Chemistry Recent Labs  Lab 10/17/19 0325  10/20/19 0916 10/21/19 0544 10/22/19 0547  NA 142   < > 137 140 139  K 2.9*   < > 4.0 3.8 4.3  CL 106   < > 107 106 108  CO2 23   < > 21* 21* 19*  GLUCOSE 115*   < > 122* 99 102*  BUN 15   < > 16 19 22   CREATININE 1.25*   < > 1.14* 1.36* 1.24*  CALCIUM 8.9   < > 9.0 8.9 8.6*  PROT 6.4*  --   --   --   --   ALBUMIN 3.7  --   --   --   --   AST 21  --   --   --   --   ALT 20  --   --   --   --   ALKPHOS 52  --   --   --   --   BILITOT 1.0  --   --   --   --  GFRNONAA 39*   < > 43* 35* 39*  GFRAA 45*   < > 50* 40* 45*  ANIONGAP 13   < > 9 13 12    < > = values in this interval not displayed.     Hematology Recent Labs  Lab 10/18/19 0449 10/21/19 0544 10/22/19 0019  WBC 6.7 6.7 6.2  RBC 4.35 4.29 4.32  HGB 13.4 13.3 13.2  HCT 40.0 39.0 40.0  MCV 92.0 90.9 92.6  MCH 30.8 31.0 30.6  MCHC 33.5 34.1 33.0  RDW 13.3 13.4 13.3  PLT 196 186 193    BNP Recent Labs  Lab 10/17/19 0325  BNP 189.0*     DDimer No results for input(s): DDIMER in the last 168 hours.   Radiology    No results found.  Cardiac Studies   TTE: 10/17/2019  1. Left ventricular ejection fraction, by visual estimation, is 55 to 60%. The left ventricle has normal function. There is mildly increased left ventricular hypertrophy. 2. Mild aortic stenosis is present. Vmax 2.3 cm/s, mean gradient 11 mmHG, AVA 1.86 cm2. No change from prior study (06/04/2018). 3. Small left ventricular internal cavity size. 4. The left ventricle has no regional wall motion abnormalities. 5. Global right ventricle has normal systolic function.The right ventricular size is normal. No increase in right ventricular wall thickness. 6. Left atrial size was mildly dilated. 7. Right atrial size  was normal. 8. Presence of pericardial fat pad. 9. The mitral valve is degenerative. Mild mitral valve regurgitation. 10. The tricuspid valve is grossly normal. Tricuspid valve regurgitation is trivial. 11. The aortic valve has an indeterminant number of cusps. Aortic valve regurgitation is mild. Mild aortic valve stenosis. 12. There is Severe calcifcation of the aortic valve. 13. There is Severely thickening of the aortic valve. 14. The pulmonic valve was grossly normal. Pulmonic valve regurgitation is not visualized. 15. Mild plaque invoving the ascending aorta. 16. Normal pulmonary artery systolic pressure. 17. The tricuspid regurgitant velocity is 2.34 m/s, and with an assumed right atrial pressure of 3 mmHg, the estimated right ventricular systolic pressure is normal at 24.9 mmHg. 18. A pacer wire is visualized in the RA and RV. 19. The inferior vena cava is normal in size with greater than 50% respiratory variability, suggesting right atrial pressure of 3 mmHg.   Patient Profile     85 y.o. female  with a hx of Paroxysmal Afib, pauses s/p pacemaker, HTN, Aortic stenosis, ascending aortic aneurysm (4.1 cm 02/22/2018), hypothyroidism, and chronic UTIwho is being seen today for the evaluation of exertional dyspnea.She is a patient of Dr. Harrington Challenger and Dr. Caryl Comes, last seen in October 2019. She has history of paroxysmal A. fib with significant pauses status post pacemaker placement, she has noted mild aortic stenosis and regurgitation based on echo in 2018 with hyperdynamic LVEF. Her shortness of breath is exertional and started about a week ago, no associated chest pain orthopnea proximal nocturnal dyspnea. Her pacemaker was interrogated a week ago and showed A. fib burden of 5%.  Assessment & Plan    Exertional Dyspnea/Abnormal stress test -Patient reported DOE for 1 week. -CXR with no acute disease. -CT chest negative for PE andstable aortic aneurysm.  -Her troponins are negative.   -Chest CTA shows mild coronary calcifications and RCA and circumflex artery. -nuclear stress test was abnormal with possible ischemia in the inferoseptal, septal and apical anteroseptal walls with transient ischemic dilatation (TID 1.67) and normal wall motion. Given transient ischemic dilatation recommend proceeding with heart  cath. - Plan for cath today. Creatinine was up yesterday and IVF were started lat night. Creatinine today improved to 1.24. continue to monitor. - Patient feels breathing is slowly improving. She has been able to walk up and down the halls with no significant SOB.   Paroxysmal Afib -On arrival patient was in NSR>> wentinto Afib RVR, rates to the 120s. Rates now down to the 60s. Was on atenolol 25 mg daily for rate control  - maintaining NSR/atrial paced rhythm on tele - continue BB for rate control - CHADSVASC = 4 (female, age, CHF, HTN) -Hold Eliquis for cath>> restart post cath - start IV Heparin per pharmacy - TSH 0.687 - Potassium 3.8 - Continue Telemetry  Chronic Diastolic HF - EF [id number] with preserved EF and G2DD - 2D echo with normal LVF, mild AS/AR and mild MR - BNP 189 - Has been euvolemic since admission - net neg1.7L - creatinine 1.24today - repeat BMET daily  Pauses s/p pacemaker - [employer] device - last interrogation 10/6/20showing AT/AF 5.3%  UTI/lower back pain - abx stopped per IM  MildAortic Stenosis/AI -Stable on echothis admit  Ascending aortic aneurysm -Stable at 41 mm  HTN - BP controlled  - continue BB and amlodipine 5 mg po daily - ARB held due to AKI  AKI - creatinine1.49 on admission.baseline 0.86. Today 1.24 - ARB held - continue to trend    For questions or updates, please contact [place] Please consult [email or web address] for contact info under        Signed, Cadence Ninfa Meeker, PA-C  10/22/2019, 8:46 AM    Personally seen and examined. Agree with above.   Doing well, less SOB.   Abnormal stress test Cath today Hydrated overnight Creat 1.2 Spoke with daughter and Dr. Broadus John Lives at [facility] independent living.  Potential DC later today if cath reassuring Would restart Eliquis tomorrow evening.   Candee Furbish, MD

## 2019-10-22 NOTE — Progress Notes (Signed)
ANTICOAGULATION CONSULT NOTE - [place] for heparin Indication: atrial fibrillation  Allergies  Allergen Reactions  . Ciprofloxacin Other (See Comments)    Unknown  . Latex Other (See Comments)    Unknown  . Levofloxacin Other (See Comments)    Unknown  . Macrobid [employer] Macro] Other (See Comments)    Unknown  . Nitrofurantoin Other (See Comments)    Unknown  . Sulfa Antibiotics Other (See Comments)    Unknown    Patient Measurements: Height: 5\' 8"  (172.7 cm) Weight: 204 lb 3.2 oz (92.6 kg) IBW/kg (Calculated) : 63.9 Heparin Dosing Weight: 83 kg  Vital Signs: Temp: 97.5 F (36.4 C) (11/17 0530) Temp Source: Oral (11/17 0530) BP: 141/67 (11/17 0530) Pulse Rate: 64 (11/17 0530)  Labs: Recent Labs    10/20/19 0916  10/21/19 0544 10/21/19 1837 10/22/19 0019 10/22/19 0331 10/22/19 0547  HGB  --   --  13.3  --  13.2  --   --   HCT  --   --  39.0  --  40.0  --   --   PLT  --   --  186  --  193  --   --   APTT 31   < > 159* 48* 66* 58*  --   HEPARINUNFRC >2.20*  --  1.72*  --  1.04*  --   --   CREATININE 1.14*  --  1.36*  --   --   --  1.24*   < > = values in this interval not displayed.    Estimated Creatinine Clearance: 38 mL/min (A) (by C-G formula based on SCr of 1.24 mg/dL (H)).  Assessment: 44 yof with a history of a fib (CHADSVASC = 4 (female, age, CHF, HTN)) on Eliquis PTA, last dose was 11/14 at 2200. Pharmacy consulted to dose heparin and hold Eliquis, as patient is going to have cardiac cath done.   Heparin level this morning was still elevated (1.0 units/ml) due to recent apixaban.   PTT 58 sec (low end below therapeutic). No bleeding noted. CBC stable.   Goal of Therapy:  Heparin level 0.3-0.7 units/ml Target aPTT 66-102 seconds  Monitor platelets by anticoagulation protocol: Yes   Plan:  Increase IV heparin infusion to 1000 units/hr Monitor daily aPTT, heparin level, CBC Monitor for signs/symptoms  of bleeding  Erin Hearing PharmD., BCPS Clinical Pharmacist 10/22/2019 8:37 AM

## 2019-10-22 NOTE — Progress Notes (Signed)
CSW lvm with Raquel Sarna at [facility] to inform of patients return to [place] apartment.  [place], [place]

## 2019-10-22 NOTE — Interval H&P Note (Signed)
Cath Lab Visit (complete for each Cath Lab visit)  Clinical Evaluation Leading to the Procedure:   ACS: No.  Non-ACS:    Anginal Classification: CCS III  Anti-ischemic medical therapy: Minimal Therapy (1 class of medications)  Non-Invasive Test Results: Intermediate-risk stress test findings: cardiac mortality 1-3%/year  Prior CABG: No previous CABG      History and Physical Interval Note:  10/22/2019 10:49 AM  Faith Parker  has presented today for surgery, with the diagnosis of angina.  The various methods of treatment have been discussed with the patient and family. After consideration of risks, benefits and other options for treatment, the patient has consented to  Procedure(s): LEFT HEART CATH AND CORONARY ANGIOGRAPHY (N/A) as a surgical intervention.  The patient's history has been reviewed, patient examined, no change in status, stable for surgery.  I have reviewed the patient's chart and labs.  Questions were answered to the patient's satisfaction.     Sherren Mocha

## 2019-10-22 NOTE — Progress Notes (Addendum)
Progress Note  Patient Name: Faith Parker Date of Encounter: 10/22/2019  Primary Cardiologist: Dorris Carnes, MD   Subjective   Plan for cath today. Creatinine improved. Patient feels SOB is improving. She has been ambulating with no significant SOB. No CP.  Inpatient Medications    Scheduled Meds: . amLODipine  5 mg Oral Daily  . atenolol  25 mg Oral Daily  . FLUoxetine  20 mg Oral Daily  . levothyroxine  112 mcg Oral Q0600  . polyethylene glycol  17 g Oral Daily  . potassium chloride  40 mEq Oral BID  . rosuvastatin  20 mg Oral Daily  . sodium chloride flush  3 mL Intravenous Once  . vitamin B-12  100 mcg Oral Daily   Continuous Infusions: . sodium chloride 75 mL/hr at 10/21/19 1900  . sodium chloride 1 mL/kg/hr (10/22/19 0151)  . heparin 900 Units/hr (10/21/19 1619)   PRN Meds: acetaminophen **OR** acetaminophen, ALPRAZolam, alum & mag hydroxide-simeth, labetalol, ondansetron **OR** ondansetron (ZOFRAN) IV   Vital Signs    Vitals:   10/21/19 1231 10/21/19 1959 10/22/19 0530 10/22/19 0534  BP: 113/67 128/68 (!) 141/67   Pulse: 60 60 64   Resp:  17 17   Temp: 98.4 F (36.9 C) 98.2 F (36.8 C) (!) 97.5 F (36.4 C)   TempSrc: Oral Oral Oral   SpO2: 97% (!) 89% 98%   Weight:    92.6 kg  Height:        Intake/Output Summary (Last 24 hours) at 10/22/2019 0846 Last data filed at 10/22/2019 0300 Gross per 24 hour  Intake 2237.29 ml  Output 1051 ml  Net 1186.29 ml   Last 3 Weights 10/22/2019 10/21/2019 10/20/2019  Weight (lbs) 204 lb 3.2 oz 201 lb 4.8 oz 200 lb 4.8 oz  Weight (kg) 92.625 kg 91.309 kg 90.855 kg      Telemetry    NSR and Atrial-paced rhythm; HR 60s - Personally Reviewed  ECG    No new - Personally Reviewed  Physical Exam   GEN: No acute distress.   Neck: No JVD Cardiac: RRR, systolic murmur, rubs, or gallops.  Respiratory: Clear to auscultation bilaterally. GI: Soft, nontender, non-distended  MS: No edema; No deformity. Neuro:   Nonfocal  Psych: Normal affect   Labs    High Sensitivity Troponin:   Recent Labs  Lab 10/16/19 1605 10/16/19 2311 10/17/19 0325 10/17/19 0557  TROPONINIHS 9 10 17  24*      Chemistry Recent Labs  Lab 10/17/19 0325  10/20/19 0916 10/21/19 0544 10/22/19 0547  NA 142   < > 137 140 139  K 2.9*   < > 4.0 3.8 4.3  CL 106   < > 107 106 108  CO2 23   < > 21* 21* 19*  GLUCOSE 115*   < > 122* 99 102*  BUN 15   < > 16 19 22   CREATININE 1.25*   < > 1.14* 1.36* 1.24*  CALCIUM 8.9   < > 9.0 8.9 8.6*  PROT 6.4*  --   --   --   --   ALBUMIN 3.7  --   --   --   --   AST 21  --   --   --   --   ALT 20  --   --   --   --   ALKPHOS 52  --   --   --   --   BILITOT 1.0  --   --   --   --  GFRNONAA 39*   < > 43* 35* 39*  GFRAA 45*   < > 50* 40* 45*  ANIONGAP 13   < > 9 13 12    < > = values in this interval not displayed.     Hematology Recent Labs  Lab 10/18/19 0449 10/21/19 0544 10/22/19 0019  WBC 6.7 6.7 6.2  RBC 4.35 4.29 4.32  HGB 13.4 13.3 13.2  HCT 40.0 39.0 40.0  MCV 92.0 90.9 92.6  MCH 30.8 31.0 30.6  MCHC 33.5 34.1 33.0  RDW 13.3 13.4 13.3  PLT 196 186 193    BNP Recent Labs  Lab 10/17/19 0325  BNP 189.0*     DDimer No results for input(s): DDIMER in the last 168 hours.   Radiology    No results found.  Cardiac Studies   TTE: 10/17/2019  1. Left ventricular ejection fraction, by visual estimation, is 55 to 60%. The left ventricle has normal function. There is mildly increased left ventricular hypertrophy. 2. Mild aortic stenosis is present. Vmax 2.3 cm/s, mean gradient 11 mmHG, AVA 1.86 cm2. No change from prior study (06/04/2018). 3. Small left ventricular internal cavity size. 4. The left ventricle has no regional wall motion abnormalities. 5. Global right ventricle has normal systolic function.The right ventricular size is normal. No increase in right ventricular wall thickness. 6. Left atrial size was mildly dilated. 7. Right atrial size  was normal. 8. Presence of pericardial fat pad. 9. The mitral valve is degenerative. Mild mitral valve regurgitation. 10. The tricuspid valve is grossly normal. Tricuspid valve regurgitation is trivial. 11. The aortic valve has an indeterminant number of cusps. Aortic valve regurgitation is mild. Mild aortic valve stenosis. 12. There is Severe calcifcation of the aortic valve. 13. There is Severely thickening of the aortic valve. 14. The pulmonic valve was grossly normal. Pulmonic valve regurgitation is not visualized. 15. Mild plaque invoving the ascending aorta. 16. Normal pulmonary artery systolic pressure. 17. The tricuspid regurgitant velocity is 2.34 m/s, and with an assumed right atrial pressure of 3 mmHg, the estimated right ventricular systolic pressure is normal at 24.9 mmHg. 18. A pacer wire is visualized in the RA and RV. 19. The inferior vena cava is normal in size with greater than 50% respiratory variability, suggesting right atrial pressure of 3 mmHg.   Patient Profile     85 y.o. female  with a hx of Paroxysmal Afib, pauses s/p pacemaker, HTN, Aortic stenosis, ascending aortic aneurysm (4.1 cm 02/22/2018), hypothyroidism, and chronic UTIwho is being seen today for the evaluation of exertional dyspnea.She is a patient of Dr. Harrington Challenger and Dr. Caryl Comes, last seen in October 2019. She has history of paroxysmal A. fib with significant pauses status post pacemaker placement, she has noted mild aortic stenosis and regurgitation based on echo in 2018 with hyperdynamic LVEF. Her shortness of breath is exertional and started about a week ago, no associated chest pain orthopnea proximal nocturnal dyspnea. Her pacemaker was interrogated a week ago and showed A. fib burden of 5%.  Assessment & Plan    Exertional Dyspnea/Abnormal stress test -Patient reported DOE for 1 week. -CXR with no acute disease. -CT chest negative for PE andstable aortic aneurysm.  -Her troponins are negative.   -Chest CTA shows mild coronary calcifications and RCA and circumflex artery. -nuclear stress test was abnormal with possible ischemia in the inferoseptal, septal and apical anteroseptal walls with transient ischemic dilatation (TID 1.67) and normal wall motion. Given transient ischemic dilatation recommend proceeding with heart  cath. - Plan for cath today. Creatinine was up yesterday and IVF were started lat night. Creatinine today improved to 1.24. continue to monitor. - Patient feels breathing is slowly improving. She has been able to walk up and down the halls with no significant SOB.   Paroxysmal Afib -On arrival patient was in NSR>> wentinto Afib RVR, rates to the 120s. Rates now down to the 60s. Was on atenolol 25 mg daily for rate control  - maintaining NSR/atrial paced rhythm on tele - continue BB for rate control - CHADSVASC = 4 (female, age, CHF, HTN) -Hold Eliquis for cath>> restart post cath - start IV Heparin per pharmacy - TSH 0.687 - Potassium 3.8 - Continue Telemetry  Chronic Diastolic HF - EF [id number] with preserved EF and G2DD - 2D echo with normal LVF, mild AS/AR and mild MR - BNP 189 - Has been euvolemic since admission - net neg1.7L - creatinine 1.24today - repeat BMET daily  Pauses s/p pacemaker - [employer] device - last interrogation 10/6/20showing AT/AF 5.3%  UTI/lower back pain - abx stopped per IM  MildAortic Stenosis/AI -Stable on echothis admit  Ascending aortic aneurysm -Stable at 41 mm  HTN - BP controlled  - continue BB and amlodipine 5 mg po daily - ARB held due to AKI  AKI - creatinine1.49 on admission.baseline 0.86. Today 1.24 - ARB held - continue to trend    For questions or updates, please contact [place] Please consult [email or web address] for contact info under        Signed, Cadence Ninfa Meeker, PA-C  10/22/2019, 8:46 AM    Personally seen and examined. Agree with above.   Doing well, less SOB.   Abnormal stress test Cath today Hydrated overnight Creat 1.2 Spoke with daughter and Dr. Broadus John Lives at [facility] independent living.  Potential DC later today if cath reassuring Would restart Eliquis tomorrow evening.   Candee Furbish, MD

## 2019-10-22 NOTE — Progress Notes (Signed)
ANTICOAGULATION CONSULT NOTE - [place] for heparin Indication: atrial fibrillation  Allergies  Allergen Reactions  . Ciprofloxacin Other (See Comments)    Unknown  . Latex Other (See Comments)    Unknown  . Levofloxacin Other (See Comments)    Unknown  . Macrobid [employer] Macro] Other (See Comments)    Unknown  . Nitrofurantoin Other (See Comments)    Unknown  . Sulfa Antibiotics Other (See Comments)    Unknown    Patient Measurements: Height: 5\' 8"  (172.7 cm) Weight: 201 lb 4.8 oz (91.3 kg) IBW/kg (Calculated) : 63.9 Heparin Dosing Weight: 83 kg  Vital Signs: Temp: 98.2 F (36.8 C) (11/16 1959) Temp Source: Oral (11/16 1959) BP: 128/68 (11/16 1959) Pulse Rate: 60 (11/16 1959)  Labs: Recent Labs    10/20/19 0916  10/21/19 0544 10/21/19 1837 10/22/19 0019  HGB  --   --  13.3  --  13.2  HCT  --   --  39.0  --  40.0  PLT  --   --  186  --  193  APTT 31   < > 159* 48* 66*  HEPARINUNFRC >2.20*  --  1.72*  --   --   CREATININE 1.14*  --  1.36*  --   --    < > = values in this interval not displayed.    Estimated Creatinine Clearance: 34.5 mL/min (A) (by C-G formula based on SCr of 1.36 mg/dL (H)).  Assessment: 81 yof with a history of a fib (CHADSVASC = 4 (female, age, CHF, HTN)) on Eliquis PTA, last dose was 11/14 at 2200. Pharmacy consulted to dose heparin and hold Eliquis, as patient is going to have cardiac cath done.   Heparin level this morning was still elevated (1.72 units/ml) due to recent apixaban. aPTT this morning was above goal at 159 sec. CBC stable.   PTT 66 sec (low end of therapeutic). No more issues with line per RN. No bleeding noted.  Goal of Therapy:  Heparin level 0.3-0.7 units/ml Target aPTT 66-102 seconds  Monitor platelets by anticoagulation protocol: Yes   Plan:  Continue heparin infusion at 900 units/hr Monitor daily aPTT, heparin level, CBC Monitor for signs/symptoms of  bleeding  Sherlon Handing, PharmD, BCPS 10/22/2019 1:30 AM

## 2019-10-29 ENCOUNTER — Other Ambulatory Visit: Payer: Self-pay | Admitting: *Deleted

## 2019-10-29 NOTE — Patient Outreach (Signed)
[facility]) Care Management  10/29/2019  HARMONIE WONDRA 1930/04/10 [id number]   RED ON EMMI ALERT - General Discharge Day # 4 Date: 10/28/2019 Red Alert Reason: Sad/Hopeless/Anxious/Empty   Outreach attempt #1, successful.  Referral received from care management assistant for above noted red EMMI alert.  She was recently discharged from hospital for cystitis and DOE.  Per chart, has history of hypertension, thoracic aortic aneurysm, hypothyroidism, and chronic UTI.  Call placed to member to follow up on above alert.  She report she is doing well but was irritated with the automated calling system.  She wanted to explain her answer more but didn't have a chance to as she was not speaking with a live person.  State "that survey is crazy."  Report she has been frustrated with returning to [facility] after discharge and having to be in quarantine for 14 days because of the hospitalization.  She has been there for 7 days already, she's in contact with her children daily.  Has MD appointments with PCP on 11/30 and with cardiology on 12/11, denies need for transportation as her daughter will take her.  Denies the need for further education/assistance from [facility] as she has support at the facility as well as from her children.  State her daughter is a Systems developer and helps to manage her health care.     Plan: RN CM will send successful outreach letter to provide this care manager's contact information for potential future reference but will not open case as no further needs identified.  Valente David, [place], MSN [place] [phone]

## 2019-11-07 DIAGNOSIS — E7800 Pure hypercholesterolemia, unspecified: Secondary | ICD-10-CM | POA: Diagnosis not present

## 2019-11-07 DIAGNOSIS — Z87440 Personal history of urinary (tract) infections: Secondary | ICD-10-CM | POA: Diagnosis not present

## 2019-11-07 DIAGNOSIS — R0609 Other forms of dyspnea: Secondary | ICD-10-CM | POA: Diagnosis not present

## 2019-11-07 DIAGNOSIS — E039 Hypothyroidism, unspecified: Secondary | ICD-10-CM | POA: Diagnosis not present

## 2019-11-07 DIAGNOSIS — Z91040 Latex allergy status: Secondary | ICD-10-CM | POA: Diagnosis not present

## 2019-11-07 DIAGNOSIS — E559 Vitamin D deficiency, unspecified: Secondary | ICD-10-CM | POA: Diagnosis not present

## 2019-11-07 DIAGNOSIS — I4891 Unspecified atrial fibrillation: Secondary | ICD-10-CM | POA: Diagnosis not present

## 2019-11-07 DIAGNOSIS — R918 Other nonspecific abnormal finding of lung field: Secondary | ICD-10-CM | POA: Diagnosis not present

## 2019-11-07 DIAGNOSIS — Z7901 Long term (current) use of anticoagulants: Secondary | ICD-10-CM | POA: Diagnosis not present

## 2019-11-12 ENCOUNTER — Other Ambulatory Visit: Payer: Self-pay | Admitting: Family Medicine

## 2019-11-12 DIAGNOSIS — N3946 Mixed incontinence: Secondary | ICD-10-CM | POA: Diagnosis not present

## 2019-11-12 DIAGNOSIS — R350 Frequency of micturition: Secondary | ICD-10-CM | POA: Diagnosis not present

## 2019-11-12 DIAGNOSIS — R9389 Abnormal findings on diagnostic imaging of other specified body structures: Secondary | ICD-10-CM

## 2019-11-13 ENCOUNTER — Other Ambulatory Visit: Payer: Self-pay | Admitting: Internal Medicine

## 2019-11-13 NOTE — Telephone Encounter (Signed)
Pt last saw Dr Caryl Comes on 06/11/19, last labs 10/22/19 Creat 1.24, age 85, weight 92.6kg, based on specified criteria pt is on appropriate dosage of Eliquis 5mg  BID.  Will refill rx.

## 2019-11-14 NOTE — Progress Notes (Signed)
Cardiology Office Note   Date:  11/17/2019   ID:  Faith Parker, DOB 11-Jul-1930, MRN [id number]  PCP:  Leighton Ruff, MD  Cardiologist:   Dorris Carnes, MD   F/U of paroxysmal atrial fib and bradycardia      History of Present Illness: Faith Parker is a 85 y.o. female with a history of HTN, Aortic stenosis /insuff, hypothyroids.  Episode of chest tightness and nearsyncope  EMS tele strips with atrial fib.  MRI nega  Echo with mild AS, AI.  CT neg for PE  Aorta 4.1 cm    The pt was admitteed to [facility]  in early July 2019 with chest discomfort  Found to have signif pauses  (6 to 7 seconds)   Also had PAF with RVR   Underwent placement of PPI      I last saw the pt in July 2019    Since seen the pt the pt was admitted in Nov with dyspnea     Echo done:  Mild AS   Myovue done showed ischemia.   She went on to have L heart cath that showed minimal nonobstructive CAD    She was treated for UTI and sent home    Since seen she denies dizziness  No CP   Breathing is OK   Having problems paying for Eliuqis   In [place]  Current Meds  Medication Sig  . ALPRAZolam (XANAX) 0.5 MG tablet Take 0.5 mg by mouth 3 (three) times daily as needed for anxiety.  Marland Kitchen atenolol (TENORMIN) 25 MG tablet Take 25 mg by mouth daily.   . Cholecalciferol (VITAMIN D3) 5000 units TABS Take 5,000 Units by mouth daily.  Marland Kitchen ELIQUIS 5 MG TABS tablet TAKE 1 TABLET BY MOUTH TWICE DAILY.  Marland Kitchen FLUoxetine (PROZAC) 20 MG tablet Take 20 mg by mouth daily.  Marland Kitchen levothyroxine (SYNTHROID) 100 MCG tablet Take 100 mcg by mouth daily before breakfast.   . losartan (COZAAR) 100 MG tablet Take 0.5 tablets (50 mg total) by mouth daily.  . Multiple Vitamins-Minerals (MULTIVITAMIN PO) Take 1 tablet by mouth daily.  . Omega-3 Fatty Acids (FISH OIL) 1200 MG CAPS Take 4 capsules by mouth daily.  Vladimir Faster Glycol-Propyl Glycol (SYSTANE) 0.4-0.3 % SOLN Place 1 drop into both eyes at bedtime.  . Probiotic Product (ALIGN PO) Take 1  capsule by mouth daily.   . rosuvastatin (CRESTOR) 20 MG tablet TAKE 1 TABLET BY MOUTH DAILY. (Patient taking differently: Take 20 mg by mouth daily. )  . traMADol (ULTRAM) 50 MG tablet Take 50 mg by mouth every 6 (six) hours as needed for moderate pain.   . vitamin B-12 (CYANOCOBALAMIN) 100 MCG tablet Take 100 mcg by mouth daily.  . vitamin C (ASCORBIC ACID) 500 MG tablet Take 500 mg by mouth daily.     Allergies:   Ciprofloxacin, Latex, Levofloxacin, Macrobid [nitrofurantoin monohyd macro], Nitrofurantoin, and Sulfa antibiotics   Past Medical History:  Diagnosis Date  . Anxiety   . Aortic regurgitation   . Atrial fibrillation ([place])   . Gastritis   . GERD (gastroesophageal reflux disease)   . H/O artificial eye lens   . Hypertension   . Hypothyroid   . Left ventricular hypertrophy   . Major depression   . Osteoarthritis   . Osteoporosis   . Peptic ulcer disease   . Stress incontinence     Past Surgical History:  Procedure Laterality Date  . BLADDER SURGERY    .  EYE SURGERY    . KNEE ARTHROPLASTY    . LEFT HEART CATH AND CORONARY ANGIOGRAPHY N/A 10/22/2019   Procedure: LEFT HEART CATH AND CORONARY ANGIOGRAPHY;  Surgeon: Sherren Mocha, MD;  Location: [place] [facility];  Service: Cardiovascular;  Laterality: N/A;  . LEG SURGERY Right    multiple surgeries, unknown type  . PACEMAKER IMPLANT N/A 06/04/2018   Procedure: PACEMAKER IMPLANT;  Surgeon: Deboraha Sprang, MD;  Location: [place] [facility];  Service: Cardiovascular;  Laterality: N/A;     Social History:  The patient  reports that she has never smoked. She has never used smokeless tobacco. She reports that she does not drink alcohol or use drugs.   Family History:  The patient's family history is not on file.    ROS:  Please see the history of present illness. All other systems are reviewed and  Negative to the above problem except as noted.    PHYSICAL EXAM: VS:  BP 128/82   Pulse 75   Ht 5\' 8"  (1.727 m)    Wt 210 lb 12.8 oz (95.6 kg)   BMI 32.05 kg/m   GEN: Well nourished, well developed, in no acute distress  HEENT: normal  Neck: JVp is normal  , carotid bruits, or masses Chest  Pacer site clean  Dry  No edema   Cardiac: RRR; Gr II /vI sytolif murmur  No  rubs, or gallops,Tr edema  Respiratory:  clear to auscultation bilaterally, normal work of breathing GI: soft, nontender, nondistended, + BS  No hepatomegaly  MS: no deformity Moving all extremities   Skin: warm and dry, no rash Neuro:  Strength and sensation are intact Psych: euthymic mood, full affect   EKG:  EKG is not ordered today.   Lipid Panel No results found for: CHOL, TRIG, HDL, CHOLHDL, VLDL, LDLCALC, LDLDIRECT    Wt Readings from Last 3 Encounters:  11/15/19 210 lb 12.8 oz (95.6 kg)  10/22/19 204 lb 3.2 oz (92.6 kg)  09/11/18 217 lb 12.8 oz (98.8 kg)    CARDIAC STUDIES ECHO 10/17/19 1. Left ventricular ejection fraction, by visual estimation, is 55 to 60%. The left ventricle has normal function. There is mildly increased left ventricular hypertrophy. 2. Mild aortic stenosis is present. Vmax 2.3 cm/s, mean gradient 11 mmHG, AVA 1.86 cm2. No change from prior study (06/04/2018). 3. Small left ventricular internal cavity size. 4. The left ventricle has no regional wall motion abnormalities. 5. Global right ventricle has normal systolic function.The right ventricular size is normal. No increase in right ventricular wall thickness. 6. Left atrial size was mildly dilated. 7. Right atrial size was normal. 8. Presence of pericardial fat pad. 9. The mitral valve is degenerative. Mild mitral valve regurgitation. 10. The tricuspid valve is grossly normal. Tricuspid valve regurgitation is trivial. 11. The aortic valve has an indeterminant number of cusps. Aortic valve regurgitation is mild. Mild aortic valve stenosis. 12. There is Severe calcifcation of the aortic valve. 13. There is Severely thickening of the aortic  valve. 14. The pulmonic valve was grossly normal. Pulmonic valve regurgitation is not visualized. 15. Mild plaque invoving the ascending aorta. 16. Normal pulmonary artery systolic pressure. 17. The tricuspid regurgitant velocity is 2.34 m/s, and with an assumed right atrial pressure of 3 mmHg, the estimated right ventricular systolic pressure is normal at 24.9 mmHg. 18. A pacer wire is visualized in the RA and RV. 19. The inferior vena cava is normal in size with greater than 50% respiratory  variability, suggesting right atrial pressure of 3 mmHg. In comparison to the previous echocardiogram(s): Prior examinations were reviewed in a side by side  MYOVUE:   10/19/19  There was no ST segment deviation noted during stress.  Defect 1: There is a large defect of moderate severity present in the basal inferoseptal, mid anteroseptal, mid inferoseptal and apical septal location.  Findings consistent with ischemia.  This is an intermediate risk study.  Nuclear stress EF: 61%.  Abnormal TID ratio of 1.67   Large size, moderate severity inferoseptal, septal and apical anteroseptal perfusion defect c/w ischemia (SDS 5). Abnormal TID ratio of 1.67. LVEF 61% with normal wall motion. This is an intermediate risk study. Clinical correlation advised.  CARDIAC CATH: 10/22/19 Minimalirregularities ASSESSMENT AND PLAN:  1  PAF WIll give samples of ELiquis   Check on coverage for other agents including coumadin  2   Tachy brady   Pt is s/p PPM     2  HTN  BP is OK  3 AV dz   Echo in Nov showed mild stenosis.     4  TAA    Minimal dilation    5  Dizzinesss  Pt denies    WIll get labs done at Dr Drema Dallas' office   F/u in 9 months   Current medicines are reviewed at length with the patient today.  The patient does not have concerns regarding medicines.  Signed, Dorris Carnes, MD  11/17/2019 10:15 PM    [facility] [address], [place], [place]  [address] Phone:  [phone]; Fax: [phone]

## 2019-11-15 ENCOUNTER — Encounter: Payer: Self-pay | Admitting: Internal Medicine

## 2019-11-15 ENCOUNTER — Other Ambulatory Visit: Payer: Self-pay

## 2019-11-15 ENCOUNTER — Ambulatory Visit (INDEPENDENT_AMBULATORY_CARE_PROVIDER_SITE_OTHER): Payer: Medicare Other | Admitting: Internal Medicine

## 2019-11-15 VITALS — BP 128/82 | HR 75 | Ht 68.0 in | Wt 210.8 lb

## 2019-11-15 DIAGNOSIS — I350 Nonrheumatic aortic (valve) stenosis: Secondary | ICD-10-CM | POA: Diagnosis not present

## 2019-11-15 DIAGNOSIS — E782 Mixed hyperlipidemia: Secondary | ICD-10-CM | POA: Diagnosis not present

## 2019-11-15 DIAGNOSIS — R42 Dizziness and giddiness: Secondary | ICD-10-CM

## 2019-11-15 DIAGNOSIS — I480 Paroxysmal atrial fibrillation: Secondary | ICD-10-CM

## 2019-11-15 DIAGNOSIS — N390 Urinary tract infection, site not specified: Secondary | ICD-10-CM | POA: Diagnosis not present

## 2019-11-15 NOTE — Patient Instructions (Addendum)
Medication Instructions:  No changes today *If you need a refill on your cardiac medications before your next appointment, please call your pharmacy*  Lab Work: none If you have labs (blood work) drawn today and your tests are completely normal, you will receive your results only by: Marland Kitchen MyChart Message (if you have MyChart) OR . A paper copy in the mail If you have any lab test that is abnormal or we need to change your treatment, we will call you to review the results.  Testing/Procedures: none  Follow-Up: At [facility], you and your health needs are our priority.  As part of our continuing mission to provide you with exceptional heart care, we have created designated Provider Care Teams.  These Care Teams include your primary Cardiologist (physician) and Advanced Practice Providers (APPs -  Physician Assistants and Nurse Practitioners) who all work together to provide you with the care you need, when you need it.  Your next appointment:   9 month(s)  The format for your next appointment:   In Person  Provider:   You may see Dorris Carnes, MD or one of the following Advanced Practice Providers on your designated Care Team:    Richardson Dopp, PA-C  [place], [place]  Daune Perch, NP   Other Instructions  Addendum: message to medical records to request recent labs from PCP mwilson/11/15/19 4:58 pm

## 2019-12-09 DIAGNOSIS — Z23 Encounter for immunization: Secondary | ICD-10-CM | POA: Diagnosis not present

## 2019-12-10 ENCOUNTER — Ambulatory Visit (INDEPENDENT_AMBULATORY_CARE_PROVIDER_SITE_OTHER): Payer: Medicare Other | Admitting: *Deleted

## 2019-12-10 DIAGNOSIS — I480 Paroxysmal atrial fibrillation: Secondary | ICD-10-CM | POA: Diagnosis not present

## 2019-12-10 LAB — CUP PACEART REMOTE DEVICE CHECK
Battery Remaining Longevity: 155 mo
Battery Voltage: 3.04 V
Brady Statistic AP VP Percent: 0.03 %
Brady Statistic AP VS Percent: 37.53 %
Brady Statistic AS VP Percent: 0.05 %
Brady Statistic AS VS Percent: 62.39 %
Brady Statistic RA Percent Paced: 35.51 %
Brady Statistic RV Percent Paced: 1.56 %
Date Time Interrogation Session: 20210105005102
Implantable Lead Implant Date: 20190701
Implantable Lead Implant Date: 20190701
Implantable Lead Location: 753859
Implantable Lead Location: 753860
Implantable Lead Model: 5076
Implantable Lead Model: 5076
Implantable Pulse Generator Implant Date: 20190701
Lead Channel Impedance Value: 323 Ohm
Lead Channel Impedance Value: 342 Ohm
Lead Channel Impedance Value: 399 Ohm
Lead Channel Impedance Value: 437 Ohm
Lead Channel Pacing Threshold Amplitude: 0.5 V
Lead Channel Pacing Threshold Amplitude: 1.25 V
Lead Channel Pacing Threshold Pulse Width: 0.4 ms
Lead Channel Pacing Threshold Pulse Width: 0.4 ms
Lead Channel Sensing Intrinsic Amplitude: 1.25 mV
Lead Channel Sensing Intrinsic Amplitude: 1.25 mV
Lead Channel Sensing Intrinsic Amplitude: 12.375 mV
Lead Channel Sensing Intrinsic Amplitude: 12.375 mV
Lead Channel Setting Pacing Amplitude: 1.5 V
Lead Channel Setting Pacing Amplitude: 2.75 V
Lead Channel Setting Pacing Pulse Width: 0.4 ms
Lead Channel Setting Sensing Sensitivity: 2 mV

## 2020-01-06 DIAGNOSIS — Z23 Encounter for immunization: Secondary | ICD-10-CM | POA: Diagnosis not present

## 2020-02-17 DIAGNOSIS — R350 Frequency of micturition: Secondary | ICD-10-CM | POA: Diagnosis not present

## 2020-02-17 DIAGNOSIS — E7800 Pure hypercholesterolemia, unspecified: Secondary | ICD-10-CM | POA: Diagnosis not present

## 2020-02-17 DIAGNOSIS — E559 Vitamin D deficiency, unspecified: Secondary | ICD-10-CM | POA: Diagnosis not present

## 2020-02-17 DIAGNOSIS — F339 Major depressive disorder, recurrent, unspecified: Secondary | ICD-10-CM | POA: Diagnosis not present

## 2020-02-17 DIAGNOSIS — R413 Other amnesia: Secondary | ICD-10-CM | POA: Diagnosis not present

## 2020-02-17 DIAGNOSIS — E039 Hypothyroidism, unspecified: Secondary | ICD-10-CM | POA: Diagnosis not present

## 2020-02-17 DIAGNOSIS — M545 Low back pain: Secondary | ICD-10-CM | POA: Diagnosis not present

## 2020-02-24 DIAGNOSIS — D3131 Benign neoplasm of right choroid: Secondary | ICD-10-CM | POA: Diagnosis not present

## 2020-02-24 DIAGNOSIS — H18591 Other hereditary corneal dystrophies, right eye: Secondary | ICD-10-CM | POA: Diagnosis not present

## 2020-02-24 DIAGNOSIS — T868402 Corneal transplant rejection, left eye: Secondary | ICD-10-CM | POA: Diagnosis not present

## 2020-03-10 ENCOUNTER — Ambulatory Visit (INDEPENDENT_AMBULATORY_CARE_PROVIDER_SITE_OTHER): Payer: Medicare Other | Admitting: *Deleted

## 2020-03-10 DIAGNOSIS — I480 Paroxysmal atrial fibrillation: Secondary | ICD-10-CM | POA: Diagnosis not present

## 2020-03-10 LAB — CUP PACEART REMOTE DEVICE CHECK
Battery Remaining Longevity: 151 mo
Battery Voltage: 3.03 V
Brady Statistic AP VP Percent: 0.07 %
Brady Statistic AP VS Percent: 56.54 %
Brady Statistic AS VP Percent: 0.04 %
Brady Statistic AS VS Percent: 43.35 %
Brady Statistic RA Percent Paced: 54.42 %
Brady Statistic RV Percent Paced: 0.69 %
Date Time Interrogation Session: 20210406015037
Implantable Lead Implant Date: 20190701
Implantable Lead Implant Date: 20190701
Implantable Lead Location: 753859
Implantable Lead Location: 753860
Implantable Lead Model: 5076
Implantable Lead Model: 5076
Implantable Pulse Generator Implant Date: 20190701
Lead Channel Impedance Value: 285 Ohm
Lead Channel Impedance Value: 342 Ohm
Lead Channel Impedance Value: 380 Ohm
Lead Channel Impedance Value: 418 Ohm
Lead Channel Pacing Threshold Amplitude: 0.5 V
Lead Channel Pacing Threshold Amplitude: 1.375 V
Lead Channel Pacing Threshold Pulse Width: 0.4 ms
Lead Channel Pacing Threshold Pulse Width: 0.4 ms
Lead Channel Sensing Intrinsic Amplitude: 1.5 mV
Lead Channel Sensing Intrinsic Amplitude: 1.5 mV
Lead Channel Sensing Intrinsic Amplitude: 11.75 mV
Lead Channel Sensing Intrinsic Amplitude: 11.75 mV
Lead Channel Setting Pacing Amplitude: 1.5 V
Lead Channel Setting Pacing Amplitude: 3 V
Lead Channel Setting Pacing Pulse Width: 0.4 ms
Lead Channel Setting Sensing Sensitivity: 2 mV

## 2020-03-11 NOTE — Progress Notes (Signed)
PPM Remote  

## 2020-03-20 DIAGNOSIS — D3131 Benign neoplasm of right choroid: Secondary | ICD-10-CM | POA: Diagnosis not present

## 2020-04-06 DIAGNOSIS — F419 Anxiety disorder, unspecified: Secondary | ICD-10-CM | POA: Diagnosis not present

## 2020-04-06 DIAGNOSIS — F339 Major depressive disorder, recurrent, unspecified: Secondary | ICD-10-CM | POA: Diagnosis not present

## 2020-04-06 DIAGNOSIS — R413 Other amnesia: Secondary | ICD-10-CM | POA: Diagnosis not present

## 2020-04-20 ENCOUNTER — Other Ambulatory Visit: Payer: Self-pay | Admitting: Internal Medicine

## 2020-04-20 NOTE — Telephone Encounter (Signed)
Eliquis 5mg  refill request received. Patient is 86 years old, weight-95.6kg, Crea-1.24 on 10/22/2019, Diagnosis-Afib, and last seen by Dr. Harrington Challenger on 11/15/2019. Dose is appropriate based on dosing criteria. Will send in refill to requested pharmacy.

## 2020-06-15 ENCOUNTER — Other Ambulatory Visit: Payer: Self-pay | Admitting: Internal Medicine

## 2020-07-10 ENCOUNTER — Other Ambulatory Visit: Payer: Self-pay | Admitting: Internal Medicine

## 2020-07-13 DIAGNOSIS — Z6830 Body mass index (BMI) 30.0-30.9, adult: Secondary | ICD-10-CM | POA: Diagnosis not present

## 2020-07-13 DIAGNOSIS — M25561 Pain in right knee: Secondary | ICD-10-CM | POA: Diagnosis not present

## 2020-07-13 DIAGNOSIS — M5136 Other intervertebral disc degeneration, lumbar region: Secondary | ICD-10-CM | POA: Diagnosis not present

## 2020-07-13 DIAGNOSIS — M150 Primary generalized (osteo)arthritis: Secondary | ICD-10-CM | POA: Diagnosis not present

## 2020-07-13 DIAGNOSIS — M25562 Pain in left knee: Secondary | ICD-10-CM | POA: Diagnosis not present

## 2020-07-13 DIAGNOSIS — E559 Vitamin D deficiency, unspecified: Secondary | ICD-10-CM | POA: Diagnosis not present

## 2020-07-13 DIAGNOSIS — M810 Age-related osteoporosis without current pathological fracture: Secondary | ICD-10-CM | POA: Diagnosis not present

## 2020-07-13 DIAGNOSIS — E669 Obesity, unspecified: Secondary | ICD-10-CM | POA: Diagnosis not present

## 2020-07-16 ENCOUNTER — Other Ambulatory Visit: Payer: Self-pay | Admitting: Internal Medicine

## 2020-07-16 DIAGNOSIS — M810 Age-related osteoporosis without current pathological fracture: Secondary | ICD-10-CM

## 2020-09-08 ENCOUNTER — Ambulatory Visit (INDEPENDENT_AMBULATORY_CARE_PROVIDER_SITE_OTHER): Payer: Medicare Other

## 2020-09-08 DIAGNOSIS — R55 Syncope and collapse: Secondary | ICD-10-CM

## 2020-09-08 LAB — CUP PACEART REMOTE DEVICE CHECK
Battery Remaining Longevity: 145 mo
Battery Voltage: 3.02 V
Brady Statistic AP VP Percent: 0.05 %
Brady Statistic AP VS Percent: 75.46 %
Brady Statistic AS VP Percent: 0.02 %
Brady Statistic AS VS Percent: 24.48 %
Brady Statistic RA Percent Paced: 72.11 %
Brady Statistic RV Percent Paced: 0.8 %
Date Time Interrogation Session: 20211005015046
Implantable Lead Implant Date: 20190701
Implantable Lead Implant Date: 20190701
Implantable Lead Location: 753859
Implantable Lead Location: 753860
Implantable Lead Model: 5076
Implantable Lead Model: 5076
Implantable Pulse Generator Implant Date: 20190701
Lead Channel Impedance Value: 323 Ohm
Lead Channel Impedance Value: 342 Ohm
Lead Channel Impedance Value: 380 Ohm
Lead Channel Impedance Value: 475 Ohm
Lead Channel Pacing Threshold Amplitude: 0.75 V
Lead Channel Pacing Threshold Amplitude: 1.25 V
Lead Channel Pacing Threshold Pulse Width: 0.4 ms
Lead Channel Pacing Threshold Pulse Width: 0.4 ms
Lead Channel Sensing Intrinsic Amplitude: 1.75 mV
Lead Channel Sensing Intrinsic Amplitude: 1.75 mV
Lead Channel Sensing Intrinsic Amplitude: 12.25 mV
Lead Channel Sensing Intrinsic Amplitude: 12.25 mV
Lead Channel Setting Pacing Amplitude: 1.5 V
Lead Channel Setting Pacing Amplitude: 2.75 V
Lead Channel Setting Pacing Pulse Width: 0.4 ms
Lead Channel Setting Sensing Sensitivity: 2 mV

## 2020-09-11 NOTE — Progress Notes (Signed)
Remote pacemaker transmission.   

## 2020-09-16 DIAGNOSIS — Z23 Encounter for immunization: Secondary | ICD-10-CM | POA: Diagnosis not present

## 2020-09-28 ENCOUNTER — Other Ambulatory Visit: Payer: Self-pay | Admitting: Internal Medicine

## 2020-09-28 NOTE — Telephone Encounter (Signed)
Prescription refill request for Eliquis received.  Last office visit: 11/15/2019, Ross Scr: 0.98, 11/15/2019 Age: 86 yo Weight: 95.6 kg   Prescription refill sent.

## 2020-09-30 DIAGNOSIS — M810 Age-related osteoporosis without current pathological fracture: Secondary | ICD-10-CM | POA: Diagnosis not present

## 2020-10-07 DIAGNOSIS — M810 Age-related osteoporosis without current pathological fracture: Secondary | ICD-10-CM | POA: Diagnosis not present

## 2020-10-13 DIAGNOSIS — Z23 Encounter for immunization: Secondary | ICD-10-CM | POA: Diagnosis not present

## 2020-10-15 DIAGNOSIS — R413 Other amnesia: Secondary | ICD-10-CM | POA: Diagnosis not present

## 2020-10-15 DIAGNOSIS — E7800 Pure hypercholesterolemia, unspecified: Secondary | ICD-10-CM | POA: Diagnosis not present

## 2020-10-15 DIAGNOSIS — M810 Age-related osteoporosis without current pathological fracture: Secondary | ICD-10-CM | POA: Diagnosis not present

## 2020-10-15 DIAGNOSIS — F419 Anxiety disorder, unspecified: Secondary | ICD-10-CM | POA: Diagnosis not present

## 2020-10-15 DIAGNOSIS — I10 Essential (primary) hypertension: Secondary | ICD-10-CM | POA: Diagnosis not present

## 2020-10-15 DIAGNOSIS — E559 Vitamin D deficiency, unspecified: Secondary | ICD-10-CM | POA: Diagnosis not present

## 2020-10-15 DIAGNOSIS — E039 Hypothyroidism, unspecified: Secondary | ICD-10-CM | POA: Diagnosis not present

## 2020-10-15 DIAGNOSIS — Z7901 Long term (current) use of anticoagulants: Secondary | ICD-10-CM | POA: Diagnosis not present

## 2020-10-22 ENCOUNTER — Other Ambulatory Visit: Payer: Medicare Other

## 2020-11-03 DIAGNOSIS — E878 Other disorders of electrolyte and fluid balance, not elsewhere classified: Secondary | ICD-10-CM | POA: Diagnosis not present

## 2020-11-03 DIAGNOSIS — M25562 Pain in left knee: Secondary | ICD-10-CM | POA: Diagnosis not present

## 2020-11-03 DIAGNOSIS — M150 Primary generalized (osteo)arthritis: Secondary | ICD-10-CM | POA: Diagnosis not present

## 2020-11-03 DIAGNOSIS — M810 Age-related osteoporosis without current pathological fracture: Secondary | ICD-10-CM | POA: Diagnosis not present

## 2020-11-03 DIAGNOSIS — M5136 Other intervertebral disc degeneration, lumbar region: Secondary | ICD-10-CM | POA: Diagnosis not present

## 2020-11-03 DIAGNOSIS — E559 Vitamin D deficiency, unspecified: Secondary | ICD-10-CM | POA: Diagnosis not present

## 2020-11-03 DIAGNOSIS — M25561 Pain in right knee: Secondary | ICD-10-CM | POA: Diagnosis not present

## 2020-11-19 DIAGNOSIS — I10 Essential (primary) hypertension: Secondary | ICD-10-CM | POA: Diagnosis not present

## 2020-11-19 DIAGNOSIS — R413 Other amnesia: Secondary | ICD-10-CM | POA: Diagnosis not present

## 2020-11-19 DIAGNOSIS — R0989 Other specified symptoms and signs involving the circulatory and respiratory systems: Secondary | ICD-10-CM | POA: Diagnosis not present

## 2020-11-19 DIAGNOSIS — Z0000 Encounter for general adult medical examination without abnormal findings: Secondary | ICD-10-CM | POA: Diagnosis not present

## 2020-11-19 DIAGNOSIS — F419 Anxiety disorder, unspecified: Secondary | ICD-10-CM | POA: Diagnosis not present

## 2020-11-19 DIAGNOSIS — F339 Major depressive disorder, recurrent, unspecified: Secondary | ICD-10-CM | POA: Diagnosis not present

## 2020-11-19 DIAGNOSIS — M810 Age-related osteoporosis without current pathological fracture: Secondary | ICD-10-CM | POA: Diagnosis not present

## 2020-11-20 ENCOUNTER — Other Ambulatory Visit (HOSPITAL_COMMUNITY): Payer: Self-pay | Admitting: Family Medicine

## 2020-11-20 DIAGNOSIS — R0989 Other specified symptoms and signs involving the circulatory and respiratory systems: Secondary | ICD-10-CM

## 2020-11-30 ENCOUNTER — Ambulatory Visit (HOSPITAL_COMMUNITY)
Admission: RE | Admit: 2020-11-30 | Discharge: 2020-11-30 | Disposition: A | Discharge status: Home / Self Care | Payer: Medicare Other | Source: Ambulatory Visit | Attending: Family Medicine | Admitting: Family Medicine

## 2020-11-30 ENCOUNTER — Other Ambulatory Visit: Payer: Self-pay

## 2020-11-30 DIAGNOSIS — R0989 Other specified symptoms and signs involving the circulatory and respiratory systems: Secondary | ICD-10-CM

## 2020-11-30 NOTE — Progress Notes (Signed)
Carotid artery duplex completed. Refer to "CV Proc" under chart review to view preliminary results.  11/30/2020 1:16 PM Kelby Aline., MHA, RVT, RDCS, RDMS

## 2020-12-08 ENCOUNTER — Ambulatory Visit (INDEPENDENT_AMBULATORY_CARE_PROVIDER_SITE_OTHER): Payer: Medicare Other

## 2020-12-08 DIAGNOSIS — I495 Sick sinus syndrome: Secondary | ICD-10-CM | POA: Diagnosis not present

## 2020-12-08 LAB — CUP PACEART REMOTE DEVICE CHECK
Battery Remaining Longevity: 140 mo
Battery Voltage: 3.02 V
Brady Statistic AP VP Percent: 0.05 %
Brady Statistic AP VS Percent: 79.5 %
Brady Statistic AS VP Percent: 0.02 %
Brady Statistic AS VS Percent: 20.44 %
Brady Statistic RA Percent Paced: 76.34 %
Brady Statistic RV Percent Paced: 0.74 %
Date Time Interrogation Session: 20220104004010
Implantable Lead Implant Date: 20190701
Implantable Lead Implant Date: 20190701
Implantable Lead Location: 753859
Implantable Lead Location: 753860
Implantable Lead Model: 5076
Implantable Lead Model: 5076
Implantable Pulse Generator Implant Date: 20190701
Lead Channel Impedance Value: 304 Ohm
Lead Channel Impedance Value: 342 Ohm
Lead Channel Impedance Value: 399 Ohm
Lead Channel Impedance Value: 437 Ohm
Lead Channel Pacing Threshold Amplitude: 0.625 V
Lead Channel Pacing Threshold Amplitude: 1.5 V
Lead Channel Pacing Threshold Pulse Width: 0.4 ms
Lead Channel Pacing Threshold Pulse Width: 0.4 ms
Lead Channel Sensing Intrinsic Amplitude: 1.75 mV
Lead Channel Sensing Intrinsic Amplitude: 1.75 mV
Lead Channel Sensing Intrinsic Amplitude: 13 mV
Lead Channel Sensing Intrinsic Amplitude: 13 mV
Lead Channel Setting Pacing Amplitude: 1.5 V
Lead Channel Setting Pacing Amplitude: 3 V
Lead Channel Setting Pacing Pulse Width: 0.4 ms
Lead Channel Setting Sensing Sensitivity: 2 mV

## 2020-12-18 DIAGNOSIS — N3946 Mixed incontinence: Secondary | ICD-10-CM | POA: Diagnosis not present

## 2020-12-18 DIAGNOSIS — R350 Frequency of micturition: Secondary | ICD-10-CM | POA: Diagnosis not present

## 2020-12-23 NOTE — Progress Notes (Signed)
Remote pacemaker transmission.   

## 2020-12-24 NOTE — Progress Notes (Signed)
Cardiology Office Note   Date:  12/25/2020   ID:  CHARL WELLEN, DOB Mar 29, 1930, MRN [id number]  PCP:  Leighton Ruff, MD  Cardiologist:   Dorris Carnes, MD   F/U of paroxysmal atrial fib and bradycardia      History of Present Illness: Faith Parker is a 87 y.o. female with a history of HTN, aortic stenosis /insuff, hypothyroidism.  Also episode of chest tightness and near syncope in past   EMS tele strips with atrial fib.  MRI negative  Echo with mild AS, AI.  CT neg for PE  Aorta 4.1 cm   In June 2019 the pt was admitted to [facility] for CP   Found to have 6 to 7 sec pauses    Also PAF with RVR    Underwent placement of PPI     IN Nov 2019 the pt was aditted with dyspnea     Echo done:  Mild AS   Myovue done showed ischemia.   She went on to have L heart cath that showed minimal nonobstructive CAD    She was treated for UTI and sent home    [name] in Dec 2020  SInce the the pat has felt OK  No CP  No palpitations     Current Meds  Medication Sig   ALPRAZolam (XANAX) 0.5 MG tablet Take 0.5 mg by mouth 3 (three) times daily as needed for anxiety.   atenolol (TENORMIN) 25 MG tablet Take 25 mg by mouth daily.    calcium carbonate (OSCAL) 1500 (600 Ca) MG TABS tablet Take by mouth daily in the afternoon.   cephALEXin (KEFLEX) 250 MG capsule Take 250 mg by mouth daily. uti   Cholecalciferol (VITAMIN D3) 5000 units TABS Take 5,000 Units by mouth daily.   ELIQUIS 5 MG TABS tablet TAKE 1 TABLET BY MOUTH TWICE DAILY.   FLUoxetine (PROZAC) 40 MG capsule Take 40 mg by mouth at bedtime.   levothyroxine (SYNTHROID) 100 MCG tablet Take 100 mcg by mouth daily before breakfast.    losartan (COZAAR) 50 MG tablet Take 50 mg by mouth daily.   Multiple Vitamins-Minerals (MULTIVITAMIN PO) Take 1 tablet by mouth daily.   Omega-3 Fatty Acids (FISH OIL) 1200 MG CAPS Take 4 capsules by mouth daily.   Polyethyl Glycol-Propyl Glycol 0.4-0.3 % SOLN Place 1 drop into both eyes at  bedtime.   Probiotic Product (ALIGN PO) Take 1 capsule by mouth daily.    rosuvastatin (CRESTOR) 20 MG tablet Take 1 tablet (20 mg total) by mouth daily. Pt needs to make appt with provider for further refills - 1st attempt   traMADol (ULTRAM) 50 MG tablet Take 50 mg by mouth every 6 (six) hours as needed for moderate pain.    vitamin B-12 (CYANOCOBALAMIN) 100 MCG tablet Take 100 mcg by mouth daily.   vitamin C (ASCORBIC ACID) 500 MG tablet Take 500 mg by mouth daily.     Allergies:   Ciprofloxacin, Latex, Levofloxacin, Macrobid [nitrofurantoin monohyd macro], Nitrofurantoin, and Sulfa antibiotics   Past Medical History:  Diagnosis Date   Anxiety    Aortic regurgitation    Atrial fibrillation (HCC)    Gastritis    GERD (gastroesophageal reflux disease)    H/O artificial eye lens    Hypertension    Hypothyroid    Left ventricular hypertrophy    Major depression    Osteoarthritis    Osteoporosis    Peptic ulcer disease    Stress  incontinence     Past Surgical History:  Procedure Laterality Date   BLADDER SURGERY     EYE SURGERY     KNEE ARTHROPLASTY     LEFT HEART CATH AND CORONARY ANGIOGRAPHY N/A 10/22/2019   Procedure: LEFT HEART CATH AND CORONARY ANGIOGRAPHY;  Surgeon: Sherren Mocha, MD;  Location: [place] [facility];  Service: Cardiovascular;  Laterality: N/A;   LEG SURGERY Right    multiple surgeries, unknown type   PACEMAKER IMPLANT N/A 06/04/2018   Procedure: PACEMAKER IMPLANT;  Surgeon: Deboraha Sprang, MD;  Location: [place] [facility];  Service: Cardiovascular;  Laterality: N/A;     Social History:  The patient  reports that she has never smoked. She has never used smokeless tobacco. She reports that she does not drink alcohol and does not use drugs.   Family History:  The patient's family history is not on file.    ROS:  Please see the history of present illness. All other systems are reviewed and  Negative to the above problem except  as noted.    PHYSICAL EXAM: VS:  BP 118/70    Pulse 84    Ht 5' 8.5" (1.74 m)    Wt 206 lb (93.4 kg)    SpO2 98%    BMI 30.87 kg/m   GEN: Obese 87 yo in no acute distress  HEENT: normal  Neck: JVp is normal  , carotid bruits Chest  Pacer site clean  Dry  No edema   Cardiac: RRR; Gr II /vI sytolif murmur  NO LE edema Respiratory:  clear to auscultation bilaterally, GI: soft, nontender, nondistended, + BS  No hepatomegaly  MS: no deformity Moving all extremities   Skin: warm and dry, no rash Neuro:  Strength and sensation are intact Psych: euthymic mood, full affect   EKG:  EKG is  ordered today.  NSR 84 bpm    Lipid Panel No results found for: CHOL, TRIG, HDL, CHOLHDL, VLDL, LDLCALC, LDLDIRECT    Wt Readings from Last 3 Encounters:  12/25/20 206 lb (93.4 kg)  11/15/19 210 lb 12.8 oz (95.6 kg)  10/22/19 204 lb 3.2 oz (92.6 kg)    CARDIAC STUDIES ECHO 10/17/19 1. Left ventricular ejection fraction, by visual estimation, is 55 to 60%. The left ventricle has normal function. There is mildly increased left ventricular hypertrophy. 2. Mild aortic stenosis is present. Vmax 2.3 cm/s, mean gradient 11 mmHG, AVA 1.86 cm2. No change from prior study (06/04/2018). 3. Small left ventricular internal cavity size. 4. The left ventricle has no regional wall motion abnormalities. 5. Global right ventricle has normal systolic function.The right ventricular size is normal. No increase in right ventricular wall thickness. 6. Left atrial size was mildly dilated. 7. Right atrial size was normal. 8. Presence of pericardial fat pad. 9. The mitral valve is degenerative. Mild mitral valve regurgitation. 10. The tricuspid valve is grossly normal. Tricuspid valve regurgitation is trivial. 11. The aortic valve has an indeterminant number of cusps. Aortic valve regurgitation is mild. Mild aortic valve stenosis. 12. There is Severe calcifcation of the aortic valve. 13. There is Severely thickening of  the aortic valve. 14. The pulmonic valve was grossly normal. Pulmonic valve regurgitation is not visualized. 15. Mild plaque invoving the ascending aorta. 16. Normal pulmonary artery systolic pressure. 17. The tricuspid regurgitant velocity is 2.34 m/s, and with an assumed right atrial pressure of 3 mmHg, the estimated right ventricular systolic pressure is normal at 24.9 mmHg. 18. A pacer wire  is visualized in the RA and RV. 19. The inferior vena cava is normal in size with greater than 50% respiratory variability, suggesting right atrial pressure of 3 mmHg. In comparison to the previous echocardiogram(s): Prior examinations were reviewed in a side by side  MYOVUE:   10/19/19  There was no ST segment deviation noted during stress.  Defect 1: There is a large defect of moderate severity present in the basal inferoseptal, mid anteroseptal, mid inferoseptal and apical septal location.  Findings consistent with ischemia.  This is an intermediate risk study.  Nuclear stress EF: 61%.  Abnormal TID ratio of 1.67   Large size, moderate severity inferoseptal, septal and apical anteroseptal perfusion defect c/w ischemia (SDS 5). Abnormal TID ratio of 1.67. LVEF 61% with normal wall motion. This is an intermediate risk study. Clinical correlation advised.  CARDIAC CATH: 10/22/19 Minimalirregularities ASSESSMENT AND PLAN:  1  PAF No clinical recurrence  Keep on Eliquis  2   Tachy brady   Pt is s/p PPM     2  HTN  BP is controlled  FOllow   3 AV dz  Mild AS on echo    CLinilcally I am not convinced of signif change   FOllow    4  Thoracic Aortic aneurysm  Minimal dilation    5  Dizzinesss  Pt denies    F/U in 12 months    Current medicines are reviewed at length with the patient today.  The patient does not have concerns regarding medicines.  Signed, Dorris Carnes, MD  12/25/2020 9:35 PM    [address], [place], [place]  [address] Phone: [phone]; Fax: [phone]

## 2020-12-25 ENCOUNTER — Encounter: Payer: Self-pay | Admitting: Internal Medicine

## 2020-12-25 ENCOUNTER — Ambulatory Visit (INDEPENDENT_AMBULATORY_CARE_PROVIDER_SITE_OTHER): Payer: Medicare Other | Admitting: Internal Medicine

## 2020-12-25 ENCOUNTER — Other Ambulatory Visit: Payer: Self-pay

## 2020-12-25 VITALS — BP 118/70 | HR 84 | Ht 68.5 in | Wt 206.0 lb

## 2020-12-25 DIAGNOSIS — I10 Essential (primary) hypertension: Secondary | ICD-10-CM | POA: Diagnosis not present

## 2020-12-25 NOTE — Patient Instructions (Signed)
Medication Instructions:  No changes *If you need a refill on your cardiac medications before your next appointment, please call your pharmacy*   Lab Work: none If you have labs (blood work) drawn today and your tests are completely normal, you will receive your results only by: MyChart Message (if you have MyChart) OR A paper copy in the mail If you have any lab test that is abnormal or we need to change your treatment, we will call you to review the results.   Testing/Procedures: none  Follow-Up: At [facility], you and your health needs are our priority.  As part of our continuing mission to provide you with exceptional heart care, we have created designated Provider Care Teams.  These Care Teams include your primary Cardiologist (physician) and Advanced Practice Providers (APPs -  Physician Assistants and Nurse Practitioners) who all work together to provide you with the care you need, when you need it.  We recommend signing up for the patient portal called "MyChart".  Sign up information is provided on this After Visit Summary.  MyChart is used to connect with patients for Virtual Visits (Telemedicine).  Patients are able to view lab/test results, encounter notes, upcoming appointments, etc.  Non-urgent messages can be sent to your provider as well.   To learn more about what you can do with MyChart, go to [email or web address].    Your next appointment:   12 month(s)  The format for your next appointment:   In Person  Provider:   You may see Paula Ross, MD or one of the following Advanced Practice Providers on your designated Care Team:   Scott Weaver, PA-C Vin Bhagat, PA-C   Other Instructions   

## 2020-12-29 DIAGNOSIS — L853 Xerosis cutis: Secondary | ICD-10-CM | POA: Diagnosis not present

## 2020-12-29 DIAGNOSIS — L309 Dermatitis, unspecified: Secondary | ICD-10-CM | POA: Diagnosis not present

## 2021-01-01 ENCOUNTER — Other Ambulatory Visit: Payer: Self-pay | Admitting: Internal Medicine

## 2021-01-22 ENCOUNTER — Other Ambulatory Visit: Payer: Self-pay | Admitting: Internal Medicine

## 2021-01-22 NOTE — Telephone Encounter (Signed)
Pt last saw Dr Harrington Challenger 12/25/20, last labs 11/03/20 Creat 1.28 at [place], age 87, weight 93.4kg, based on specified criteria pt is on appropriate dosage of Eliquis 5mg  BID.  Will refill rx.

## 2021-02-27 DIAGNOSIS — R0981 Nasal congestion: Secondary | ICD-10-CM | POA: Diagnosis not present

## 2021-02-27 DIAGNOSIS — Z03818 Encounter for observation for suspected exposure to other biological agents ruled out: Secondary | ICD-10-CM | POA: Diagnosis not present

## 2021-02-27 DIAGNOSIS — R059 Cough, unspecified: Secondary | ICD-10-CM | POA: Diagnosis not present

## 2021-02-27 DIAGNOSIS — J019 Acute sinusitis, unspecified: Secondary | ICD-10-CM | POA: Diagnosis not present

## 2021-02-27 DIAGNOSIS — J40 Bronchitis, not specified as acute or chronic: Secondary | ICD-10-CM | POA: Diagnosis not present

## 2021-03-09 ENCOUNTER — Ambulatory Visit (INDEPENDENT_AMBULATORY_CARE_PROVIDER_SITE_OTHER): Payer: Medicare Other

## 2021-03-09 DIAGNOSIS — I495 Sick sinus syndrome: Secondary | ICD-10-CM

## 2021-03-10 LAB — CUP PACEART REMOTE DEVICE CHECK
Battery Remaining Longevity: 137 mo
Battery Voltage: 3.02 V
Brady Statistic AP VP Percent: 0.06 %
Brady Statistic AP VS Percent: 76.99 %
Brady Statistic AS VP Percent: 0.02 %
Brady Statistic AS VS Percent: 22.93 %
Brady Statistic RA Percent Paced: 73.05 %
Brady Statistic RV Percent Paced: 0.67 %
Date Time Interrogation Session: 20220405015950
Implantable Lead Implant Date: 20190701
Implantable Lead Implant Date: 20190701
Implantable Lead Location: 753859
Implantable Lead Location: 753860
Implantable Lead Model: 5076
Implantable Lead Model: 5076
Implantable Pulse Generator Implant Date: 20190701
Lead Channel Impedance Value: 304 Ohm
Lead Channel Impedance Value: 342 Ohm
Lead Channel Impedance Value: 380 Ohm
Lead Channel Impedance Value: 437 Ohm
Lead Channel Pacing Threshold Amplitude: 0.625 V
Lead Channel Pacing Threshold Amplitude: 1.25 V
Lead Channel Pacing Threshold Pulse Width: 0.4 ms
Lead Channel Pacing Threshold Pulse Width: 0.4 ms
Lead Channel Sensing Intrinsic Amplitude: 1 mV
Lead Channel Sensing Intrinsic Amplitude: 1 mV
Lead Channel Sensing Intrinsic Amplitude: 11.625 mV
Lead Channel Sensing Intrinsic Amplitude: 11.625 mV
Lead Channel Setting Pacing Amplitude: 1.5 V
Lead Channel Setting Pacing Amplitude: 2.75 V
Lead Channel Setting Pacing Pulse Width: 0.4 ms
Lead Channel Setting Sensing Sensitivity: 2 mV

## 2021-03-22 NOTE — Progress Notes (Signed)
Remote pacemaker transmission.   

## 2021-05-04 DIAGNOSIS — E559 Vitamin D deficiency, unspecified: Secondary | ICD-10-CM | POA: Diagnosis not present

## 2021-05-04 DIAGNOSIS — M150 Primary generalized (osteo)arthritis: Secondary | ICD-10-CM | POA: Diagnosis not present

## 2021-05-04 DIAGNOSIS — M25562 Pain in left knee: Secondary | ICD-10-CM | POA: Diagnosis not present

## 2021-05-04 DIAGNOSIS — M810 Age-related osteoporosis without current pathological fracture: Secondary | ICD-10-CM | POA: Diagnosis not present

## 2021-05-04 DIAGNOSIS — M25561 Pain in right knee: Secondary | ICD-10-CM | POA: Diagnosis not present

## 2021-05-04 DIAGNOSIS — Z6830 Body mass index (BMI) 30.0-30.9, adult: Secondary | ICD-10-CM | POA: Diagnosis not present

## 2021-05-04 DIAGNOSIS — E669 Obesity, unspecified: Secondary | ICD-10-CM | POA: Diagnosis not present

## 2021-05-04 DIAGNOSIS — M5136 Other intervertebral disc degeneration, lumbar region: Secondary | ICD-10-CM | POA: Diagnosis not present

## 2021-05-07 ENCOUNTER — Other Ambulatory Visit: Payer: Self-pay | Admitting: Internal Medicine

## 2021-05-07 DIAGNOSIS — M810 Age-related osteoporosis without current pathological fracture: Secondary | ICD-10-CM

## 2021-06-08 ENCOUNTER — Ambulatory Visit (INDEPENDENT_AMBULATORY_CARE_PROVIDER_SITE_OTHER): Payer: Medicare Other

## 2021-06-08 DIAGNOSIS — I495 Sick sinus syndrome: Secondary | ICD-10-CM

## 2021-06-08 LAB — CUP PACEART REMOTE DEVICE CHECK
Battery Remaining Longevity: 134 mo
Battery Voltage: 3.02 V
Brady Statistic AP VP Percent: 0.1 %
Brady Statistic AP VS Percent: 79.04 %
Brady Statistic AS VP Percent: 0.03 %
Brady Statistic AS VS Percent: 20.83 %
Brady Statistic RA Percent Paced: 76.41 %
Brady Statistic RV Percent Paced: 0.69 %
Date Time Interrogation Session: 20220705015302
Implantable Lead Implant Date: 20190701
Implantable Lead Implant Date: 20190701
Implantable Lead Location: 753859
Implantable Lead Location: 753860
Implantable Lead Model: 5076
Implantable Lead Model: 5076
Implantable Pulse Generator Implant Date: 20190701
Lead Channel Impedance Value: 304 Ohm
Lead Channel Impedance Value: 323 Ohm
Lead Channel Impedance Value: 380 Ohm
Lead Channel Impedance Value: 456 Ohm
Lead Channel Pacing Threshold Amplitude: 0.625 V
Lead Channel Pacing Threshold Amplitude: 1.375 V
Lead Channel Pacing Threshold Pulse Width: 0.4 ms
Lead Channel Pacing Threshold Pulse Width: 0.4 ms
Lead Channel Sensing Intrinsic Amplitude: 1.875 mV
Lead Channel Sensing Intrinsic Amplitude: 1.875 mV
Lead Channel Sensing Intrinsic Amplitude: 13.25 mV
Lead Channel Sensing Intrinsic Amplitude: 13.25 mV
Lead Channel Setting Pacing Amplitude: 1.5 V
Lead Channel Setting Pacing Amplitude: 2.75 V
Lead Channel Setting Pacing Pulse Width: 0.4 ms
Lead Channel Setting Sensing Sensitivity: 2 mV

## 2021-06-28 NOTE — Progress Notes (Signed)
Remote pacemaker transmission.   

## 2021-07-19 DIAGNOSIS — L570 Actinic keratosis: Secondary | ICD-10-CM | POA: Diagnosis not present

## 2021-07-19 DIAGNOSIS — L814 Other melanin hyperpigmentation: Secondary | ICD-10-CM | POA: Diagnosis not present

## 2021-07-19 DIAGNOSIS — D1801 Hemangioma of skin and subcutaneous tissue: Secondary | ICD-10-CM | POA: Diagnosis not present

## 2021-07-19 DIAGNOSIS — L578 Other skin changes due to chronic exposure to nonionizing radiation: Secondary | ICD-10-CM | POA: Diagnosis not present

## 2021-07-19 DIAGNOSIS — L820 Inflamed seborrheic keratosis: Secondary | ICD-10-CM | POA: Diagnosis not present

## 2021-07-19 DIAGNOSIS — D229 Melanocytic nevi, unspecified: Secondary | ICD-10-CM | POA: Diagnosis not present

## 2021-07-19 DIAGNOSIS — L821 Other seborrheic keratosis: Secondary | ICD-10-CM | POA: Diagnosis not present

## 2021-08-29 ENCOUNTER — Other Ambulatory Visit: Payer: Self-pay | Admitting: Internal Medicine

## 2021-08-30 NOTE — Telephone Encounter (Signed)
Pt last saw Dr Harrington Challenger 12/25/20, last labs 11/03/20 Creat 1.28, age 87, weight 93.4kg, based on specified criteria pt ison appropriate dosage of Eliquis 5mg  BID for afib.  Will refill rx.

## 2021-09-07 ENCOUNTER — Ambulatory Visit (INDEPENDENT_AMBULATORY_CARE_PROVIDER_SITE_OTHER): Payer: Medicare Other

## 2021-09-07 DIAGNOSIS — I495 Sick sinus syndrome: Secondary | ICD-10-CM | POA: Diagnosis not present

## 2021-09-07 LAB — CUP PACEART REMOTE DEVICE CHECK
Battery Remaining Longevity: 131 mo
Battery Voltage: 3.01 V
Brady Statistic AP VP Percent: 0.07 %
Brady Statistic AP VS Percent: 73.76 %
Brady Statistic AS VP Percent: 0.03 %
Brady Statistic AS VS Percent: 26.15 %
Brady Statistic RA Percent Paced: 72.79 %
Brady Statistic RV Percent Paced: 0.26 %
Date Time Interrogation Session: 20221004020117
Implantable Lead Implant Date: 20190701
Implantable Lead Implant Date: 20190701
Implantable Lead Location: 753859
Implantable Lead Location: 753860
Implantable Lead Model: 5076
Implantable Lead Model: 5076
Implantable Pulse Generator Implant Date: 20190701
Lead Channel Impedance Value: 285 Ohm
Lead Channel Impedance Value: 323 Ohm
Lead Channel Impedance Value: 361 Ohm
Lead Channel Impedance Value: 475 Ohm
Lead Channel Pacing Threshold Amplitude: 0.75 V
Lead Channel Pacing Threshold Amplitude: 1.375 V
Lead Channel Pacing Threshold Pulse Width: 0.4 ms
Lead Channel Pacing Threshold Pulse Width: 0.4 ms
Lead Channel Sensing Intrinsic Amplitude: 1 mV
Lead Channel Sensing Intrinsic Amplitude: 1 mV
Lead Channel Sensing Intrinsic Amplitude: 11.875 mV
Lead Channel Sensing Intrinsic Amplitude: 11.875 mV
Lead Channel Setting Pacing Amplitude: 1.5 V
Lead Channel Setting Pacing Amplitude: 2.75 V
Lead Channel Setting Pacing Pulse Width: 0.4 ms
Lead Channel Setting Sensing Sensitivity: 2 mV

## 2021-09-15 NOTE — Progress Notes (Signed)
Remote pacemaker transmission.   

## 2021-09-20 DIAGNOSIS — Z23 Encounter for immunization: Secondary | ICD-10-CM | POA: Diagnosis not present

## 2021-09-28 DIAGNOSIS — H18521 Epithelial (juvenile) corneal dystrophy, right eye: Secondary | ICD-10-CM | POA: Diagnosis not present

## 2021-09-28 DIAGNOSIS — T868412 Corneal transplant failure, left eye: Secondary | ICD-10-CM | POA: Diagnosis not present

## 2021-09-28 DIAGNOSIS — D3131 Benign neoplasm of right choroid: Secondary | ICD-10-CM | POA: Diagnosis not present

## 2021-10-06 DIAGNOSIS — D3131 Benign neoplasm of right choroid: Secondary | ICD-10-CM | POA: Diagnosis not present

## 2021-10-19 DIAGNOSIS — I350 Nonrheumatic aortic (valve) stenosis: Secondary | ICD-10-CM | POA: Diagnosis not present

## 2021-10-19 DIAGNOSIS — E039 Hypothyroidism, unspecified: Secondary | ICD-10-CM | POA: Diagnosis not present

## 2021-10-19 DIAGNOSIS — Z7901 Long term (current) use of anticoagulants: Secondary | ICD-10-CM | POA: Diagnosis not present

## 2021-10-19 DIAGNOSIS — I4891 Unspecified atrial fibrillation: Secondary | ICD-10-CM | POA: Diagnosis not present

## 2021-10-19 DIAGNOSIS — F419 Anxiety disorder, unspecified: Secondary | ICD-10-CM | POA: Diagnosis not present

## 2021-10-19 DIAGNOSIS — R413 Other amnesia: Secondary | ICD-10-CM | POA: Diagnosis not present

## 2021-10-19 DIAGNOSIS — E7800 Pure hypercholesterolemia, unspecified: Secondary | ICD-10-CM | POA: Diagnosis not present

## 2021-10-19 DIAGNOSIS — E559 Vitamin D deficiency, unspecified: Secondary | ICD-10-CM | POA: Diagnosis not present

## 2021-10-19 DIAGNOSIS — I10 Essential (primary) hypertension: Secondary | ICD-10-CM | POA: Diagnosis not present

## 2021-11-01 ENCOUNTER — Ambulatory Visit (INDEPENDENT_AMBULATORY_CARE_PROVIDER_SITE_OTHER): Payer: Medicare Other | Admitting: Internal Medicine

## 2021-11-01 ENCOUNTER — Other Ambulatory Visit: Payer: Self-pay

## 2021-11-01 ENCOUNTER — Encounter: Payer: Self-pay | Admitting: Internal Medicine

## 2021-11-01 VITALS — BP 148/90 | HR 86 | Ht 68.5 in | Wt 201.2 lb

## 2021-11-01 DIAGNOSIS — R001 Bradycardia, unspecified: Secondary | ICD-10-CM | POA: Diagnosis not present

## 2021-11-01 DIAGNOSIS — I480 Paroxysmal atrial fibrillation: Secondary | ICD-10-CM

## 2021-11-01 DIAGNOSIS — Z959 Presence of cardiac and vascular implant and graft, unspecified: Secondary | ICD-10-CM

## 2021-11-01 MED ORDER — FUROSEMIDE 20 MG PO TABS: ORAL_TABLET | ORAL | 0 refills | Status: DC

## 2021-11-01 NOTE — Progress Notes (Signed)
Patient Care Team: Leighton Ruff, MD (Inactive) as PCP - General (Family Medicine) Fay Records, MD as PCP - Cardiology (Cardiology)   HPI  Faith Parker is a 87 y.o. female Ivin Booty 7 Mom)  Seen in follow-up for Medtronic pacemaker implanted 7/19 for atrial fibrillation with posttermination pauses of greater than 6 seconds.  Anticoagulation with Apixoban no bleeding  Echo EF 7/19 65-70% with moderate LAD (44/2.0/44)  Today, she is accompanied by her daughter and using a walker. She reports feeling fatigued constantly which has minimally worsened in the past 6 months. 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 She uses a lot of energy to navigate the world and keep herself from falling.  The patient denies chest pain,  nocturnal dyspnea, orthopnea or peripheral edema.  There have been no palpitations, lightheadedness or syncope.  Complains of fatigue and increasingly shortness of breath after exertion  She stays active by walking up and down the floors in her apartment with her friend and attending exercise classes with yoga balls. However, she has to pause to catch her breath. She sleeps using 2 pillows. However, she used to sleep with only 1 and added another pillow about 6 months ago which helped her sleep better.    She enjoys music and used to be a Company secretary.      Date Cr K Hgb  11/20 1.23 4.3 13.2  11/21 (CE) 1.28 4.1    DATE TEST EF   7/19 Echo 65-70 % Moderate LAD (44/2.0/44)  11/20 Echo  55-60  % L ventricular hypertrophy Aortic Stenosis  11/20 Stress  61 % ischemia  11/20 Cath  Non-obstructive CAD    Thromboembolic risk factors are notable for age-31 gender-1 hypertension-1 for CHADS-VASc score >=4   Records and Results Reviewed   Past Medical History:  Diagnosis Date   Anxiety    Aortic regurgitation    Atrial fibrillation (HCC)    Gastritis    GERD (gastroesophageal reflux disease)    H/O artificial eye lens     Hypertension    Hypothyroid    Left ventricular hypertrophy    Major depression    Osteoarthritis    Osteoporosis    Peptic ulcer disease    Stress incontinence     Past Surgical History:  Procedure Laterality Date   BLADDER SURGERY     EYE SURGERY     KNEE ARTHROPLASTY     LEFT HEART CATH AND CORONARY ANGIOGRAPHY N/A 10/22/2019   Procedure: LEFT HEART CATH AND CORONARY ANGIOGRAPHY;  Surgeon: Sherren Mocha, MD;  Location: [facility];  Service: Cardiovascular;  Laterality: N/A;   LEG SURGERY Right    multiple surgeries, unknown type   PACEMAKER IMPLANT N/A 06/04/2018   Procedure: PACEMAKER IMPLANT;  Surgeon: Deboraha Sprang, MD;  Location: [facility];  Service: Cardiovascular;  Laterality: N/A;    Current Meds  Medication Sig   ALPRAZolam (XANAX) 0.5 MG tablet Take 0.5 mg by mouth 3 (three) times daily as needed for anxiety.   atenolol (TENORMIN) 25 MG tablet Take 25 mg by mouth daily.    calcium carbonate (OSCAL) 1500 (600 Ca) MG TABS tablet Take by mouth daily in the afternoon.   cephALEXin (KEFLEX) 250 MG capsule Take 250 mg by mouth daily. uti   Cholecalciferol (VITAMIN D3) 5000 units TABS Take 5,000 Units by mouth daily.   ELIQUIS 5 MG TABS tablet TAKE  ONE TABLET BY MOUTH TWICE DAILY.   FLUoxetine (PROZAC) 40 MG capsule Take 40 mg by mouth at bedtime.   levothyroxine (SYNTHROID) 100 MCG tablet Take 100 mcg by mouth daily before breakfast.    losartan (COZAAR) 50 MG tablet Take 50 mg by mouth daily.   Multiple Vitamins-Minerals (MULTIVITAMIN PO) Take 1 tablet by mouth daily.   Omega-3 Fatty Acids (FISH OIL) 1200 MG CAPS Take 4 capsules by mouth daily.   Polyethyl Glycol-Propyl Glycol 0.4-0.3 % SOLN Place 1 drop into both eyes at bedtime.   Probiotic Product (ALIGN PO) Take 1 capsule by mouth daily.    rosuvastatin (CRESTOR) 20 MG tablet TAKE 1 TABLET BY MOUTH DAILY.   traMADol (ULTRAM) 50 MG tablet Take 50 mg by mouth every 6 (six) hours as needed for  moderate pain.    vitamin B-12 (CYANOCOBALAMIN) 100 MCG tablet Take 100 mcg by mouth daily.   vitamin C (ASCORBIC ACID) 500 MG tablet Take 500 mg by mouth daily.    Allergies  Allergen Reactions   Ciprofloxacin Other (See Comments)    Unknown   Latex Other (See Comments)    Unknown   Levofloxacin Other (See Comments)    Unknown   Macrobid [Nitrofurantoin Monohyd Macro] Other (See Comments)    Unknown   Nitrofurantoin Other (See Comments)    Unknown   Sulfa Antibiotics Other (See Comments)    Unknown      Review of Systems negative except from HPI and PMH  Physical Exam BP (!) 148/90   Pulse 86   Ht 5' 8.5" (1.74 m)   Wt 201 lb 3.2 oz (91.3 kg)   SpO2 98%   BMI 30.15 kg/m  Well developed and well nourished in no acute distress HENT normal Neck supple with JVP  7  Clear Device pocket well healed; without hematoma or erythema.  There is no tethering  Regular rate and rhythm, no gallop 2/6 systolic murmur  Abd-soft with active BS No Clubbing cyanosis no edema Skin-warm and dry A & Oriented  Grossly normal sensory and motor function  ECG A pacing @ 86 27/09/41   Assessment and  Plan  Atrial fibrillation-paroxysmal with posttermination pauses  Syncope associated with the above  Pacemaker-Medtronic     Hypertension  DOE  Patient continues with paroxysms of atrial fibrillation.  Comprising about 4% of her daily.  Associated with rapid rates.  Unaware.  And fatigue and dyspnea or not episodic to suggest a strong correlation.  She is tolerating Eliquis without clinical bleeding.  Continue 5 mg twice daily.  Dyspnea on exertion with worsening orthopnea suggest volume overload.  Her JVP is up a little bit.  No significant edema.  We will undertake a 3-day diuretic trial under the direction of her daughter.  We will try furosemide 20 mg every other day.  Her blood pressure is reasonably controlled apparently at home with systolics in the 846K-59D.  Her atrial  fibrillation is associate with a rapid rate and so 1 strategy would be to decrease her losartan from 50--25 and increase her atenolol from 25--50; for right now we will do 1 thing at a time and keep her losartan at 50 and her atenolol at 25.  Her fatigue may well be related to age as well as a significant efforts related to navigating with her visual impairment.  Her last TSH was normal.  I presume also her hemoglobin was normal   Current medicines are reviewed at length with the patient today .  The patient does not  have concerns regarding medicines.   I,Mykaella Javier,acting as a scribe for Virl Axe, MD.,have documented all relevant documentation on the behalf of Virl Axe, MD,as directed by  Virl Axe, MD while in the presence of Virl Axe, MD.  I, Virl Axe, MD, have reviewed all documentation for this visit. The documentation on 11/01/21 for the exam, diagnosis, procedures, and orders are all accurate and complete.

## 2021-11-01 NOTE — Patient Instructions (Signed)
Medication Instructions:  Your physician has recommended you make the following change in your medication:   Begin Furosemide 20mg  - 1 tablet by mouth every other day x 3 doses.  *If you need a refill on your cardiac medications before your next appointment, please call your pharmacy*   Lab Work: None ordered.  If you have labs (blood work) drawn today and your tests are completely normal, you will receive your results only by: [place] (if you have MyChart) OR A paper copy in the mail If you have any lab test that is abnormal or we need to change your treatment, we will call you to review the results.   Testing/Procedures: None ordered.    Follow-Up: At [facility], you and your health needs are our priority.  As part of our continuing mission to provide you with exceptional heart care, we have created designated Provider Care Teams.  These Care Teams include your primary Cardiologist (physician) and Advanced Practice Providers (APPs -  Physician Assistants and Nurse Practitioners) who all work together to provide you with the care you need, when you need it.  We recommend signing up for the patient portal called "MyChart".  Sign up information is provided on this After Visit Summary.  MyChart is used to connect with patients for Virtual Visits (Telemedicine).  Patients are able to view lab/test results, encounter notes, upcoming appointments, etc.  Non-urgent messages can be sent to your provider as well.   To learn more about what you can do with MyChart, go to [email or web address].    Your next appointment:   6 months with Dr Caryl Comes

## 2021-11-01 NOTE — Progress Notes (Deleted)
      Patient Care Team: Leighton Ruff, MD (Inactive) as PCP - General (Family Medicine) Fay Records, MD as PCP - Cardiology (Cardiology)   HPI  Faith Parker is a 87 y.o. female Ivin Booty 84 Mom)  Seen in follow-up for Medtronic pacemaker implanted 7/19 for atrial fibrillation with posttermination pauses of greater than 6 seconds.  Anticoagulation with Apixoban  *** bleeding  Echo EF 7/19 65-70% with moderate LAD (44/2.0/44)  The patient denies chest pain***, shortness of breath***, nocturnal dyspnea***, orthopnea*** or peripheral edema***.  There have been no palpitations***, lightheadedness*** or syncope***.  Complains of ***.   Date Cr K Hgb  11/20 1.23 4.3 13.2  ***/*** *** *** ***      Thromboembolic risk factors are notable for age-38 gender-1 hypertension-1 for CHADS-VASc score >=4   Records and Results Reviewed   Past Medical History:  Diagnosis Date   Anxiety    Aortic regurgitation    Atrial fibrillation (HCC)    Gastritis    GERD (gastroesophageal reflux disease)    H/O artificial eye lens    Hypertension    Hypothyroid    Left ventricular hypertrophy    Major depression    Osteoarthritis    Osteoporosis    Peptic ulcer disease    Stress incontinence     Past Surgical History:  Procedure Laterality Date   BLADDER SURGERY     EYE SURGERY     KNEE ARTHROPLASTY     LEFT HEART CATH AND CORONARY ANGIOGRAPHY N/A 10/22/2019   Procedure: LEFT HEART CATH AND CORONARY ANGIOGRAPHY;  Surgeon: Sherren Mocha, MD;  Location: [facility];  Service: Cardiovascular;  Laterality: N/A;   LEG SURGERY Right    multiple surgeries, unknown type   PACEMAKER IMPLANT N/A 06/04/2018   Procedure: PACEMAKER IMPLANT;  Surgeon: Deboraha Sprang, MD;  Location: [facility];  Service: Cardiovascular;  Laterality: N/A;    No outpatient medications have been marked as taking for the 11/01/21 encounter (Appointment) with Deboraha Sprang, MD.    Allergies   Allergen Reactions   Ciprofloxacin Other (See Comments)    Unknown   Latex Other (See Comments)    Unknown   Levofloxacin Other (See Comments)    Unknown   Macrobid [Nitrofurantoin Monohyd Macro] Other (See Comments)    Unknown   Nitrofurantoin Other (See Comments)    Unknown   Sulfa Antibiotics Other (See Comments)    Unknown      Review of Systems negative except from HPI and PMH  Physical Exam There were no vitals taken for this visit. Well developed and well nourished in no acute distress HENT normal Neck supple with JVP-flat Clear Device pocket well healed; without hematoma or erythema.  There is no tethering  Regular rate and rhythm, no *** gallop No ***/*** murmur Abd-soft with active BS No Clubbing cyanosis *** edema Skin-warm and dry A & Oriented  Grossly normal sensory and motor function  ECG ***   Assessment and  Plan  Atrial fibrillation-paroxysmal with posttermination pauses  Syncope associated with the above  Pacemaker-Medtronic          Current medicines are reviewed at length with the patient today .  The patient does not  have concerns regarding medicines.

## 2021-11-03 DIAGNOSIS — M25561 Pain in right knee: Secondary | ICD-10-CM | POA: Diagnosis not present

## 2021-11-03 DIAGNOSIS — M25562 Pain in left knee: Secondary | ICD-10-CM | POA: Diagnosis not present

## 2021-11-03 DIAGNOSIS — Z6830 Body mass index (BMI) 30.0-30.9, adult: Secondary | ICD-10-CM | POA: Diagnosis not present

## 2021-11-03 DIAGNOSIS — E669 Obesity, unspecified: Secondary | ICD-10-CM | POA: Diagnosis not present

## 2021-11-03 DIAGNOSIS — E559 Vitamin D deficiency, unspecified: Secondary | ICD-10-CM | POA: Diagnosis not present

## 2021-11-03 DIAGNOSIS — M150 Primary generalized (osteo)arthritis: Secondary | ICD-10-CM | POA: Diagnosis not present

## 2021-11-03 DIAGNOSIS — M5136 Other intervertebral disc degeneration, lumbar region: Secondary | ICD-10-CM | POA: Diagnosis not present

## 2021-11-03 DIAGNOSIS — M810 Age-related osteoporosis without current pathological fracture: Secondary | ICD-10-CM | POA: Diagnosis not present

## 2021-11-10 ENCOUNTER — Other Ambulatory Visit: Payer: Medicare Other

## 2021-12-07 ENCOUNTER — Ambulatory Visit (INDEPENDENT_AMBULATORY_CARE_PROVIDER_SITE_OTHER): Payer: Medicare Other

## 2021-12-07 DIAGNOSIS — I495 Sick sinus syndrome: Secondary | ICD-10-CM

## 2021-12-07 LAB — CUP PACEART REMOTE DEVICE CHECK
Battery Remaining Longevity: 128 mo
Battery Voltage: 3.01 V
Brady Statistic AP VP Percent: 0.18 %
Brady Statistic AP VS Percent: 75.31 %
Brady Statistic AS VP Percent: 0.06 %
Brady Statistic AS VS Percent: 24.46 %
Brady Statistic RA Percent Paced: 72.68 %
Brady Statistic RV Percent Paced: 0.62 %
Date Time Interrogation Session: 20230103011733
Implantable Lead Implant Date: 20190701
Implantable Lead Implant Date: 20190701
Implantable Lead Location: 753859
Implantable Lead Location: 753860
Implantable Lead Model: 5076
Implantable Lead Model: 5076
Implantable Pulse Generator Implant Date: 20190701
Lead Channel Impedance Value: 304 Ohm
Lead Channel Impedance Value: 323 Ohm
Lead Channel Impedance Value: 361 Ohm
Lead Channel Impedance Value: 456 Ohm
Lead Channel Pacing Threshold Amplitude: 0.75 V
Lead Channel Pacing Threshold Amplitude: 1.125 V
Lead Channel Pacing Threshold Pulse Width: 0.4 ms
Lead Channel Pacing Threshold Pulse Width: 0.4 ms
Lead Channel Sensing Intrinsic Amplitude: 0.875 mV
Lead Channel Sensing Intrinsic Amplitude: 0.875 mV
Lead Channel Sensing Intrinsic Amplitude: 12.75 mV
Lead Channel Sensing Intrinsic Amplitude: 12.75 mV
Lead Channel Setting Pacing Amplitude: 1.5 V
Lead Channel Setting Pacing Amplitude: 2.5 V
Lead Channel Setting Pacing Pulse Width: 0.4 ms
Lead Channel Setting Sensing Sensitivity: 2 mV

## 2021-12-08 ENCOUNTER — Encounter (HOSPITAL_COMMUNITY): Payer: Self-pay | Admitting: Emergency Medicine

## 2021-12-08 ENCOUNTER — Emergency Department (HOSPITAL_COMMUNITY): Payer: Medicare Other

## 2021-12-08 ENCOUNTER — Emergency Department (HOSPITAL_COMMUNITY)
Admission: EM | Admit: 2021-12-08 | Discharge: 2021-12-08 | Disposition: A | Discharge status: Home / Self Care | Payer: Medicare Other | Attending: Emergency Medicine | Admitting: Emergency Medicine

## 2021-12-08 DIAGNOSIS — R5383 Other fatigue: Secondary | ICD-10-CM | POA: Diagnosis not present

## 2021-12-08 DIAGNOSIS — R072 Precordial pain: Secondary | ICD-10-CM

## 2021-12-08 DIAGNOSIS — Z20822 Contact with and (suspected) exposure to covid-19: Secondary | ICD-10-CM | POA: Diagnosis not present

## 2021-12-08 DIAGNOSIS — R0789 Other chest pain: Secondary | ICD-10-CM | POA: Insufficient documentation

## 2021-12-08 DIAGNOSIS — R0602 Shortness of breath: Secondary | ICD-10-CM | POA: Insufficient documentation

## 2021-12-08 DIAGNOSIS — I10 Essential (primary) hypertension: Secondary | ICD-10-CM | POA: Insufficient documentation

## 2021-12-08 DIAGNOSIS — E039 Hypothyroidism, unspecified: Secondary | ICD-10-CM | POA: Insufficient documentation

## 2021-12-08 DIAGNOSIS — R079 Chest pain, unspecified: Secondary | ICD-10-CM

## 2021-12-08 LAB — CBC
HCT: 35.7 % — ABNORMAL LOW (ref 36.0–46.0)
Hemoglobin: 11.6 g/dL — ABNORMAL LOW (ref 12.0–15.0)
MCH: 29.1 pg (ref 26.0–34.0)
MCHC: 32.5 g/dL (ref 30.0–36.0)
MCV: 89.5 fL (ref 80.0–100.0)
Platelets: 223 10*3/uL (ref 150–400)
RBC: 3.99 MIL/uL (ref 3.87–5.11)
RDW: 14.6 % (ref 11.5–15.5)
WBC: 6.6 10*3/uL (ref 4.0–10.5)
nRBC: 0 % (ref 0.0–0.2)

## 2021-12-08 LAB — BASIC METABOLIC PANEL
Anion gap: 7 (ref 5–15)
BUN: 21 mg/dL (ref 8–23)
CO2: 22 mmol/L (ref 22–32)
Calcium: 8.9 mg/dL (ref 8.9–10.3)
Chloride: 107 mmol/L (ref 98–111)
Creatinine, Ser: 1.01 mg/dL — ABNORMAL HIGH (ref 0.44–1.00)
GFR, Estimated: 53 mL/min — ABNORMAL LOW (ref >60)
Glucose, Bld: 102 mg/dL — ABNORMAL HIGH (ref 70–99)
Potassium: 3.6 mmol/L (ref 3.5–5.1)
Sodium: 136 mmol/L (ref 135–145)

## 2021-12-08 LAB — TROPONIN I (HIGH SENSITIVITY)
Troponin I (High Sensitivity): 14 ng/L (ref <18)
Troponin I (High Sensitivity): 12 ng/L (ref <18)

## 2021-12-08 LAB — HEPATIC FUNCTION PANEL
ALT: 24 U/L (ref 0–44)
AST: 24 U/L (ref 15–41)
Albumin: 3.5 g/dL (ref 3.5–5.0)
Alkaline Phosphatase: 52 U/L (ref 38–126)
Bilirubin, Direct: <0.1 mg/dL (ref 0.0–0.2)
Total Bilirubin: 0.6 mg/dL (ref 0.3–1.2)
Total Protein: 6 g/dL — ABNORMAL LOW (ref 6.5–8.1)

## 2021-12-08 LAB — RESP PANEL BY RT-PCR (FLU A&B, COVID) ARPGX2
Influenza A by PCR: NEGATIVE
Influenza B by PCR: NEGATIVE
SARS Coronavirus 2 by RT PCR: NEGATIVE

## 2021-12-08 LAB — BRAIN NATRIURETIC PEPTIDE: B Natriuretic Peptide: 577.3 pg/mL — ABNORMAL HIGH (ref 0.0–100.0)

## 2021-12-08 LAB — LIPASE, BLOOD: Lipase: 45 U/L (ref 11–51)

## 2021-12-08 MED ORDER — FUROSEMIDE 10 MG/ML IJ SOLN
40.0000 mg | Freq: Once | INTRAMUSCULAR | Status: AC
Start: 2021-12-08 — End: 2021-12-08
  Administered 2021-12-08: 40 mg via INTRAVENOUS
  Filled 2021-12-08: qty 4

## 2021-12-08 MED ORDER — FUROSEMIDE 20 MG PO TABS: 20.0000 mg | ORAL_TABLET | Freq: Every day | ORAL | 0 refills | Status: DC

## 2021-12-08 NOTE — ED Provider Notes (Signed)
Emergency Department Provider Note   I have reviewed the triage vital signs and the nursing notes.   HISTORY  Chief Complaint Chest Pain   HPI Faith Parker is a 88 y.o. female with PMH of a-fib and HTN presents to the ED with chest pressure starting today at around 5 AM. No fever. She has some associated fatigue. No unilateral weakness. No abdominal or back pain.     Past Medical History:  Diagnosis Date   Anxiety    Aortic regurgitation    Atrial fibrillation (HCC)    Gastritis    GERD (gastroesophageal reflux disease)    H/O artificial eye lens    Hypertension    Hypothyroid    Left ventricular hypertrophy    Major depression    Osteoarthritis    Osteoporosis    Peptic ulcer disease    Stress incontinence     Review of Systems  Constitutional: No fever/chills Eyes: No visual changes. ENT: No sore throat. Cardiovascular: Positive chest pain. Respiratory: Denies shortness of breath. Gastrointestinal: No abdominal pain.  No nausea, no vomiting.  No diarrhea.  No constipation. Genitourinary: Negative for dysuria. Musculoskeletal: Negative for back pain. Skin: Negative for rash. Neurological: Negative for headaches, focal weakness or numbness.  ____________________________________________   PHYSICAL EXAM:  VITAL SIGNS: ED Triage Vitals  Enc Vitals Group     BP 12/08/21 1152 (!) 157/91     Pulse Rate 12/08/21 1152 (!) 59     Resp 12/08/21 1152 18     Temp 12/08/21 1152 99.1 F (37.3 C)     Temp Source 12/08/21 1152 Oral     SpO2 12/08/21 1152 99 %   Constitutional: Alert and oriented. Well appearing and in no acute distress. Eyes: Conjunctivae are normal. Head: Atraumatic. Nose: No congestion/rhinnorhea. Mouth/Throat: Mucous membranes are moist.  Neck: No stridor.  Cardiovascular: Normal rate, regular rhythm. Good peripheral circulation. Grossly normal heart sounds. No JVP.  Respiratory: Normal respiratory effort.  No retractions. Lungs with  faint crackles at the bases.  Gastrointestinal: Soft and nontender. No distention.  Musculoskeletal: No lower extremity tenderness nor edema. No gross deformities of extremities. Neurologic:  Normal speech and language.   ____________________________________________   LABS (all labs ordered are listed, but only abnormal results are displayed)  Labs Reviewed  BASIC METABOLIC PANEL - Abnormal; Notable for the following components:      Result Value   Glucose, Bld 102 (*)    Creatinine, Ser 1.01 (*)    GFR, Estimated 53 (*)    All other components within normal limits  CBC - Abnormal; Notable for the following components:   Hemoglobin 11.6 (*)    HCT 35.7 (*)    All other components within normal limits  RESP PANEL BY RT-PCR (FLU A&B, COVID) ARPGX2  BRAIN NATRIURETIC PEPTIDE  HEPATIC FUNCTION PANEL  LIPASE, BLOOD  URINALYSIS, ROUTINE W REFLEX MICROSCOPIC  TROPONIN I (HIGH SENSITIVITY)  TROPONIN I (HIGH SENSITIVITY)   ____________________________________________  EKG   EKG Interpretation  Date/Time:  Wednesday December 08 2021 11:54:26 EST Ventricular Rate:  60 PR Interval:  238 QRS Duration: 90 QT Interval:  458 QTC Calculation: 458 R Axis:   56 Text Interpretation: Atrial-paced rhythm with prolonged AV conduction Abnormal ECG When compared with ECG of 17-Oct-2019 12:41, No significant change since last tracing Confirmed by Madalyn Rob [phone]) on 12/08/2021 3:57:25 PM        ____________________________________________  RADIOLOGY  DG Chest 1 View  Result Date: 12/08/2021 CLINICAL  DATA:  Chest pain, shortness of breath. EXAM: CHEST  1 VIEW COMPARISON:  November 07, 2019. FINDINGS: Stable cardiomediastinal silhouette. Left-sided pacemaker is unchanged in position. Mild bibasilar subsegmental atelectasis is noted. Bony thorax is unremarkable. IMPRESSION: Mild bibasilar subsegmental atelectasis. Electronically Signed   By: Marijo Conception M.D.   On: 12/08/2021 11:30     ____________________________________________   PROCEDURES  Procedure(s) performed:   Procedures  None ____________________________________________   INITIAL IMPRESSION / ASSESSMENT AND PLAN / ED COURSE  Pertinent labs & imaging results that were available during my care of the patient were reviewed by me and considered in my medical decision making (see chart for details).   This patient is Presenting for Evaluation of CP and SOB, which does require a range of treatment options, and is a complaint that involves a high risk of morbidity and mortality.  The Differential Diagnoses includes all life-threatening causes for chest pain. This includes but is not exclusive to acute coronary syndrome, aortic dissection, pulmonary embolism, cardiac tamponade, community-acquired pneumonia, pericarditis, musculoskeletal chest wall pain, etc.   Medical Decision Making: Summary:  Patient presents to the ED with CP. BNP mildly elevated but patient is in no acute distress. No hypoxemia. No increased WOB. COVID and Flu negative. Plan for lasix x 4 days and will refer for Cardiology follow up. Referral placed in the chart.     Reevaluation with update and discussion with patient and family at bedside.    I did Additional Historical Information from daughter who confirms CP history and and day's events.   I decided to review pertinent External Data, and in summary patient with no recent ECHO.    Clinical Laboratory Tests Ordered, included CBC, CMP, BNP, and Troponin. BNP of 577. Troponin negative. No AKI. No leukocytosis.   Radiologic Tests Ordered, included CXR. I independently reviewed the imaging and do not appreciate a PNA. Mild edema noted.   Cardiac Monitor Tracing which shows NSR  ____________________________________________  FINAL CLINICAL IMPRESSION(S) / ED DIAGNOSES  Final diagnoses:  Chest pain    Note:  This document was prepared using Dragon voice recognition software and  may include unintentional dictation errors.  Nanda Quinton, MD, [facility] Emergency Medicine    Kortland Nichols, [facility], MD 12/12/21 [phone]

## 2021-12-08 NOTE — ED Notes (Signed)
Ambulated to BR and urinated in BR with 1-2 assist, MD at bedside talking with daughter.

## 2021-12-08 NOTE — ED Notes (Signed)
[employer] pacemaker interrogated °

## 2021-12-08 NOTE — ED Notes (Signed)
Discharge instructions reviewed with patient and family member. Patient and family member verbalized understanding of instructions. Follow-up care and medications were reviewed. Patient wheeled out of Ed in wheelchair VSS upon discharge.

## 2021-12-08 NOTE — Discharge Instructions (Signed)
You were seen in the emergency department today with chest discomfort.  Your lab work does not show signs of a heart attack.  I am starting you on several days of Lasix to help reduce fluid in the lungs.  Please follow closely with your primary care doctor as well as your cardiologist at your upcoming appointments.  If your symptoms worsen please return to the emergency department for reevaluation.

## 2021-12-08 NOTE — ED Triage Notes (Signed)
Patient BIB [facility] from [facility] with complaint of chest pressure that started at 0500. Denies chest pain at this time. Received 324mg  ASA by staff at [facility] prior to EMS arrival. 18g saline lock in left [facility]. Patient alert, oriented, and in no apparent distress at this time.

## 2021-12-08 NOTE — ED Provider Triage Note (Signed)
Emergency Medicine Provider Triage Evaluation Note  Faith Parker , a 88 y.o. female  was evaluated in triage.  Pt complains of chest pain and abnormal beat  Review of Systems  Positive: cough Negative: fever  Physical Exam  There were no vitals taken for this visit. Gen:   Awake, no distress   Resp:  Normal effort  MSK:   Moves extremities without difficulty  Other:    Medical Decision Making  Medically screening exam initiated at 11:11 AM.  Appropriate orders placed.  Faith Parker was informed that the remainder of the evaluation will be completed by another provider, this initial triage assessment does not replace that evaluation, and the importance of remaining in the ED until their evaluation is complete.     Fransico Meadow, [place] 12/08/21 1112

## 2021-12-09 ENCOUNTER — Telehealth: Payer: Self-pay | Admitting: Internal Medicine

## 2021-12-09 NOTE — Telephone Encounter (Signed)
Spoke with the pts daughter and since her last OV was 12/25/20 with Dr. Harrington Challenger... I made her an appt for 12/10/21 for new chest pain symptoms.

## 2021-12-09 NOTE — Telephone Encounter (Signed)
New Message:    Daughter is calling to let Dr Harrington Challenger that patient was seen in [facility] ER on 12-08-21 for Faith Parker. She had fluid on her lungs and her thyroid level was elevated to 9.33. She wanted Dr Harrington Challenger to be aware of this, she can review notes in Epic if she needs to. If Dr Harrington Challenger think patient should be seen before her appointment in March, please let her daughter know.

## 2021-12-10 ENCOUNTER — Other Ambulatory Visit: Payer: Self-pay | Admitting: Internal Medicine

## 2021-12-10 ENCOUNTER — Ambulatory Visit (INDEPENDENT_AMBULATORY_CARE_PROVIDER_SITE_OTHER): Payer: Medicare Other | Admitting: Internal Medicine

## 2021-12-10 ENCOUNTER — Encounter: Payer: Self-pay | Admitting: Internal Medicine

## 2021-12-10 ENCOUNTER — Other Ambulatory Visit: Payer: Self-pay

## 2021-12-10 VITALS — BP 130/76 | HR 55 | Ht 69.0 in | Wt 195.0 lb

## 2021-12-10 DIAGNOSIS — R0609 Other forms of dyspnea: Secondary | ICD-10-CM

## 2021-12-10 DIAGNOSIS — I10 Essential (primary) hypertension: Secondary | ICD-10-CM | POA: Diagnosis not present

## 2021-12-10 DIAGNOSIS — R001 Bradycardia, unspecified: Secondary | ICD-10-CM

## 2021-12-10 DIAGNOSIS — I480 Paroxysmal atrial fibrillation: Secondary | ICD-10-CM | POA: Diagnosis not present

## 2021-12-10 MED ORDER — FUROSEMIDE 20 MG PO TABS: ORAL_TABLET | ORAL | 3 refills | Status: DC

## 2021-12-10 NOTE — Patient Instructions (Signed)
Medication Instructions:  Lasix 20 mg twice a week/ Sundays and Thursdays  *If you need a refill on your cardiac medications before your next appointment, please call your pharmacy*   Lab Work: [facility] [place] If you have labs (blood work) drawn today and your tests are completely normal, you will receive your results only by: [place] (if you have MyChart) OR A paper copy in the mail If you have any lab test that is abnormal or we need to change your treatment, we will call you to review the results.   Testing/Procedures: Your physician has requested that you have an echocardiogram. Echocardiography is a painless test that uses sound waves to create images of your heart. It provides your doctor with information about the size and shape of your heart and how well your hearts chambers and valves are working. This procedure takes approximately one hour. There are no restrictions for this procedure.    Follow-Up: At [facility], you and your health needs are our priority.  As part of our continuing mission to provide you with exceptional heart care, we have created designated Provider Care Teams.  These Care Teams include your primary Cardiologist (physician) and Advanced Practice Providers (APPs -  Physician Assistants and Nurse Practitioners) who all work together to provide you with the care you need, when you need it.  We recommend signing up for the patient portal called "MyChart".  Sign up information is provided on this After Visit Summary.  MyChart is used to connect with patients for Virtual Visits (Telemedicine).  Patients are able to view lab/test results, encounter notes, upcoming appointments, etc.  Non-urgent messages can be sent to your provider as well.   To learn more about what you can do with MyChart, go to [email or web address].    Your next appointment:   2 month(s)  The format for your next appointment:   In Person  Provider:   Dorris Carnes, MD      Other Instructions REFERRAL TO [place]

## 2021-12-10 NOTE — Progress Notes (Signed)
Cardiology Office Note   Date:  12/11/2021   ID:  Faith Parker, DOB 1930/03/18, MRN [id number]  PCP:  Leighton Ruff, MD (Inactive)  Cardiologist:   Dorris Carnes, MD   F/U of paroxysmal atrial fib and bradycardia      History of Present Illness: Faith Parker is a 88 y.o. female with a history of HTN, aortic stenosis /insuff, hypothyroidism.  Also episode of  atrial fib.  MRI negative  Echo with mild AS, AI.  CT Aorta 4.1 cm   In June 2019 the pt was admitted to [facility] for CP   Found to have 6 to 7 sec pauses and PAF with RVR  Underwent placement of PPM  IN Nov 2019 the pt was aditted with dyspnea     Echo done:  Mild AS   Myovue showed ischemia.   She went on to have L heart cath that showed minimal nonobstructive CAD    She was treated for UTI and sent home    I saw the Jan 2022    The pt ws seen by Olin Pia in 11/01/21  At that visit complained of fatigue  Constant  Vision poor   Afib represented 4% total   (Did not sense)   JVP was up a little on exam   Recomm lasix x 3 days    On 12/08/20 she was seen in ED.  She complained of chest pressure that started at about 5 am   Resoved by time in ED    Troponin negative   CXR sugg of increased fluid   BNP mldly increased   She was told to take lasix 20 mg for 4 days  Since ED visit she has had no further CP   She says she is still is very fatigued   No appetite  Denies CP   Note that daughter says a few wks ago    Current Meds  Medication Sig   ALPRAZolam (XANAX) 0.5 MG tablet Take 0.5 mg by mouth 3 (three) times daily as needed for anxiety.   aspirin 81 MG chewable tablet Chew 405 mg by mouth once as needed (for chest pain).   atenolol (TENORMIN) 25 MG tablet Take 25 mg by mouth daily.    calcium carbonate (OSCAL) 1500 (600 Ca) MG TABS tablet Take 1,500 mg by mouth daily in the afternoon.   cephALEXin (KEFLEX) 250 MG capsule Take 250 mg by mouth daily.   ELIQUIS 5 MG TABS tablet TAKE ONE TABLET BY MOUTH TWICE DAILY. (Patient  taking differently: Take 5 mg by mouth 2 (two) times daily.)   FLUoxetine (PROZAC) 40 MG capsule Take 40 mg by mouth at bedtime.   furosemide (LASIX) 20 MG tablet Take one tablet (20 mg) by mouth twice a week. Sundays and Thursdays.   losartan (COZAAR) 50 MG tablet Take 50 mg by mouth daily.   Omega-3 Fatty Acids (FISH OIL) 1200 MG CAPS Take 4,800 mg by mouth daily.   Polyethyl Glycol-Propyl Glycol 0.4-0.3 % SOLN Place 1 drop into both eyes at bedtime.   rosuvastatin (CRESTOR) 20 MG tablet TAKE ONE TABLET EVERY EVENING   SYNTHROID 100 MCG tablet Take 100 mcg by mouth daily before breakfast.   traMADol (ULTRAM) 50 MG tablet Take 50 mg by mouth in the morning and at bedtime.   [DISCONTINUED] furosemide (LASIX) 20 MG tablet Take 1 tablet (20 mg total) by mouth daily for 4 days.     Allergies:   Donepezil, Ciprofloxacin,  Latex, Levofloxacin, Macrobid [nitrofurantoin monohyd macro], Nitrofurantoin, Sulfa antibiotics, and Sulfamethoxazole   Past Medical History:  Diagnosis Date   Anxiety    Aortic regurgitation    Atrial fibrillation (HCC)    Gastritis    GERD (gastroesophageal reflux disease)    H/O artificial eye lens    Hypertension    Hypothyroid    Left ventricular hypertrophy    Major depression    Osteoarthritis    Osteoporosis    Peptic ulcer disease    Stress incontinence     Past Surgical History:  Procedure Laterality Date   BLADDER SURGERY     EYE SURGERY     KNEE ARTHROPLASTY     LEFT HEART CATH AND CORONARY ANGIOGRAPHY N/A 10/22/2019   Procedure: LEFT HEART CATH AND CORONARY ANGIOGRAPHY;  Surgeon: Sherren Mocha, MD;  Location: [facility];  Service: Cardiovascular;  Laterality: N/A;   LEG SURGERY Right    multiple surgeries, unknown type   PACEMAKER IMPLANT N/A 06/04/2018   Procedure: PACEMAKER IMPLANT;  Surgeon: Deboraha Sprang, MD;  Location: [facility];  Service: Cardiovascular;  Laterality: N/A;     Social History:  The patient  reports that  she has never smoked. She has never used smokeless tobacco. She reports that she does not drink alcohol and does not use drugs.   Family History:  The patient's family history is not on file.    ROS:  Please see the history of present illness. All other systems are reviewed and  Negative to the above problem except as noted.    PHYSICAL EXAM: VS:  BP 130/76    Pulse (!) 55    Ht 5\' 9"  (1.753 m)    Wt 195 lb (88.5 kg)    SpO2 97%    BMI 28.80 kg/m   GEN: Obese 88 yo in no acute distress  HEENT: normal  Neck: JVP normal   Chest  Pacer site clean  Dry  No edema   Cardiac: RRR; Gr II /vI sytolif murmur  NO LE edema Respiratory:  clear to auscultation bilaterally, GI: soft, nontender, nondistended, + BS  No hepatomegaly  MS: no deformity Moving all extremities   Skin: warm and dry, no rash Neuro:  Strength and sensation are intact Psych: euthymic mood, full affect   EKG:  EKG is not  ordered today.   Lipid Panel No results found for: CHOL, TRIG, HDL, CHOLHDL, VLDL, LDLCALC, LDLDIRECT    Wt Readings from Last 3 Encounters:  12/10/21 195 lb (88.5 kg)  11/01/21 201 lb 3.2 oz (91.3 kg)  12/25/20 206 lb (93.4 kg)    CARDIAC STUDIES ECHO 10/17/19 1. Left ventricular ejection fraction, by visual estimation, is 55 to 60%. The left ventricle has normal function. There is mildly increased left ventricular hypertrophy. 2. Mild aortic stenosis is present. Vmax 2.3 cm/s, mean gradient 11 mmHG, AVA 1.86 cm2. No change from prior study (06/04/2018). 3. Small left ventricular internal cavity size. 4. The left ventricle has no regional wall motion abnormalities. 5. Global right ventricle has normal systolic function.The right ventricular size is normal. No increase in right ventricular wall thickness. 6. Left atrial size was mildly dilated. 7. Right atrial size was normal. 8. Presence of pericardial fat pad. 9. The mitral valve is degenerative. Mild mitral valve regurgitation. 10. The  tricuspid valve is grossly normal. Tricuspid valve regurgitation is trivial. 11. The aortic valve has an indeterminant number of cusps. Aortic valve regurgitation is mild. Mild  aortic valve stenosis. 12. There is Severe calcifcation of the aortic valve. 13. There is Severely thickening of the aortic valve. 14. The pulmonic valve was grossly normal. Pulmonic valve regurgitation is not visualized. 15. Mild plaque invoving the ascending aorta. 16. Normal pulmonary artery systolic pressure. 17. The tricuspid regurgitant velocity is 2.34 m/s, and with an assumed right atrial pressure of 3 mmHg, the estimated right ventricular systolic pressure is normal at 24.9 mmHg. 18. A pacer wire is visualized in the RA and RV. 19. The inferior vena cava is normal in size with greater than 50% respiratory variability, suggesting right atrial pressure of 3 mmHg. In comparison to the previous echocardiogram(s): Prior examinations were reviewed in a side by side  MYOVUE:   10/19/19 There was no ST segment deviation noted during stress. Defect 1: There is a large defect of moderate severity present in the basal inferoseptal, mid anteroseptal, mid inferoseptal and apical septal location. Findings consistent with ischemia. This is an intermediate risk study. Nuclear stress EF: 61%. Abnormal TID ratio of 1.67   Large size, moderate severity inferoseptal, septal and apical anteroseptal perfusion defect c/w ischemia (SDS 5). Abnormal TID ratio of 1.67. LVEF 61% with normal wall motion. This is an intermediate risk study. Clinical correlation advised.   CARDIAC CATH: 10/22/19 Minimalirregularities ASSESSMENT AND PLAN:  1  Chest tightness  Pt with recent chest tightness   Given lasix in ED   Prior to that seen in EP clinic   Given short course of lasix On exam today volume status appears OK   I am not convinced chest tightness represents ischemia , esp with near normal cath in 2020   ? Volume increase Rec    Will get labs today    Lasix 20 mg 2x per week    F/U in 1 month  2 PAF No clinical recurrence  Keep on Eliquis  3  Hx tachy brady   Pt is s/p PPM     4  HTN  BP is controlled    5  AV dz  Mild AS on echo    CLinilcally I am not convinced of signif change   FOllow    6  Thoracic Aortic aneurysm  Minimal dilation     F/U this winter   Current medicines are reviewed at length with the patient today.  The patient does not have concerns regarding medicines.  Signed, Dorris Carnes, MD  12/11/2021 8:56 PM    [address], [place], [place]  [address] Phone: [phone]; Fax: [phone]

## 2021-12-11 LAB — BASIC METABOLIC PANEL
BUN/Creatinine Ratio: 21 (ref 12–28)
BUN: 24 mg/dL (ref 8–27)
CO2: 23 mmol/L (ref 20–29)
Calcium: 9.6 mg/dL (ref 8.7–10.3)
Chloride: 103 mmol/L (ref 96–106)
Creatinine, Ser: 1.17 mg/dL — ABNORMAL HIGH (ref 0.57–1.00)
Glucose: 105 mg/dL — ABNORMAL HIGH (ref 70–99)
Potassium: 3.7 mmol/L (ref 3.5–5.2)
Sodium: 141 mmol/L (ref 134–144)
eGFR: 45 mL/min/{1.73_m2} — ABNORMAL LOW (ref >59)

## 2021-12-11 LAB — PRO B NATRIURETIC PEPTIDE: NT-Pro BNP: 697 pg/mL (ref 0–738)

## 2021-12-17 NOTE — Progress Notes (Signed)
Remote pacemaker transmission.   

## 2021-12-27 ENCOUNTER — Other Ambulatory Visit (HOSPITAL_COMMUNITY): Payer: Medicare Other

## 2021-12-31 ENCOUNTER — Encounter (HOSPITAL_COMMUNITY): Payer: Self-pay | Admitting: Internal Medicine

## 2021-12-31 ENCOUNTER — Encounter (HOSPITAL_COMMUNITY): Payer: Self-pay

## 2021-12-31 ENCOUNTER — Ambulatory Visit (HOSPITAL_COMMUNITY): Payer: Medicare Other | Attending: Cardiovascular Disease

## 2021-12-31 NOTE — Progress Notes (Signed)
Verified appointment "no show" status with R. Cathey at 14:58.

## 2022-01-07 ENCOUNTER — Other Ambulatory Visit (HOSPITAL_COMMUNITY): Payer: Medicare Other

## 2022-01-13 ENCOUNTER — Ambulatory Visit (HOSPITAL_COMMUNITY): Payer: Medicare Other | Attending: Internal Medicine

## 2022-01-13 ENCOUNTER — Other Ambulatory Visit: Payer: Self-pay

## 2022-01-13 DIAGNOSIS — I480 Paroxysmal atrial fibrillation: Secondary | ICD-10-CM | POA: Diagnosis present

## 2022-01-13 DIAGNOSIS — R001 Bradycardia, unspecified: Secondary | ICD-10-CM | POA: Diagnosis present

## 2022-01-13 DIAGNOSIS — R0609 Other forms of dyspnea: Secondary | ICD-10-CM | POA: Diagnosis present

## 2022-01-13 DIAGNOSIS — I10 Essential (primary) hypertension: Secondary | ICD-10-CM | POA: Insufficient documentation

## 2022-01-13 LAB — ECHOCARDIOGRAM COMPLETE
AR max vel: 3.34 cm2
AV Area VTI: 3.36 cm2
AV Area mean vel: 3.27 cm2
AV Mean grad: 11 mmHg
AV Peak grad: 19.4 mmHg
Ao pk vel: 2.2 m/s
Area-P 1/2: 4.39 cm2
S' Lateral: 3.3 cm

## 2022-02-01 ENCOUNTER — Encounter: Payer: Self-pay | Admitting: Internal Medicine

## 2022-02-01 ENCOUNTER — Non-Acute Institutional Stay: Payer: Medicare Other | Admitting: Internal Medicine

## 2022-02-01 DIAGNOSIS — N1831 Chronic kidney disease, stage 3a: Secondary | ICD-10-CM | POA: Diagnosis not present

## 2022-02-01 DIAGNOSIS — Z950 Presence of cardiac pacemaker: Secondary | ICD-10-CM

## 2022-02-01 DIAGNOSIS — H5440 Blindness, one eye, unspecified eye: Secondary | ICD-10-CM

## 2022-02-01 DIAGNOSIS — E785 Hyperlipidemia, unspecified: Secondary | ICD-10-CM

## 2022-02-01 DIAGNOSIS — I480 Paroxysmal atrial fibrillation: Secondary | ICD-10-CM | POA: Diagnosis not present

## 2022-02-01 DIAGNOSIS — M810 Age-related osteoporosis without current pathological fracture: Secondary | ICD-10-CM

## 2022-02-01 DIAGNOSIS — N390 Urinary tract infection, site not specified: Secondary | ICD-10-CM

## 2022-02-01 DIAGNOSIS — M159 Polyosteoarthritis, unspecified: Secondary | ICD-10-CM

## 2022-02-01 DIAGNOSIS — R609 Edema, unspecified: Secondary | ICD-10-CM

## 2022-02-01 DIAGNOSIS — F339 Major depressive disorder, recurrent, unspecified: Secondary | ICD-10-CM

## 2022-02-01 NOTE — Progress Notes (Unsigned)
Location:   [facility] Room Number: [place]:  ALF [phone]) Provider:  Veleta Miners MD  Mast, Man X, NP  Patient Care Team: Mast, Man X, NP as PCP - General (Internal Medicine) Fay Records, MD as PCP - Cardiology (Cardiology)  Extended Emergency Contact Information Primary Emergency Contact: Faith Parker,Faith Parker Address: [address]           [place], [place] [address] Faith Parker of [place] Phone: [phone] Mobile Phone: [phone] Relation: Daughter Secondary Emergency Contact: Faith Parker,Faith Parker Address: [address]          [place], [place] [address] Faith Parker of [place] Phone: [phone] Mobile Phone: [phone] Relation: Daughter  Code Status:  Full Code Goals of care: Advanced Directive information Advanced Directives 02/01/2022  Does Patient Have a Medical Advance Directive? Yes  Type of Advance Directive [place]  Does patient want to make changes to medical advance directive? No - Patient declined  Copy of [place] in Chart? Yes - validated most recent copy scanned in chart (See row information)     Chief Complaint  Patient presents with   Medical Management of Chronic Issues    New patient    HPI:  Pt is a 88 y.o. female seen today for medical management of chronic diseases.    Patient has h/o hypertension, aortic stenosis, hypothyroidism She also has a history of A-fib MRI has been negative in the past echo with mild AS S/p PPM CHF hypothyroidism History of recurrent UTI on suppressive therapy  depression Osteoporosis sees Dr. 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 Did not have any acute complaints.  Wanted to make sure the tramadol was renewed.  Past Medical History:  Diagnosis Date    Anxiety    Aortic regurgitation    Atrial fibrillation (HCC)    Chest pain    Per new patient form   Gastritis    GERD (gastroesophageal reflux disease)    H/O artificial eye lens    Hypertension    Hypothyroid    Left ventricular hypertrophy    Major depression    Osteoarthritis    Osteoporosis    Pacemaker    On Eliquis, Per new patient form   Peptic ulcer disease    Stress incontinence    Past Surgical History:  Procedure Laterality Date   ABDOMINAL SURGERY     Per new patient form   BLADDER SURGERY     DG  BONE DENSITY ([place] HX)     Per new patient form   EYE SURGERY     KNEE ARTHROPLASTY     LEFT HEART CATH AND CORONARY ANGIOGRAPHY N/A 10/22/2019   Procedure: LEFT HEART CATH AND CORONARY ANGIOGRAPHY;  Surgeon: Sherren Mocha, MD;  Location: [facility];  Service: Cardiovascular;  Laterality: N/A;   LEG SURGERY Right    multiple surgeries, unknown type   PACEMAKER IMPLANT N/A 06/04/2018   Procedure: PACEMAKER IMPLANT;  Surgeon: Deboraha Sprang, MD;  Location: [facility];  Service: Cardiovascular;  Laterality: N/A;    Allergies  Allergen Reactions   Donepezil Nausea And Vomiting and Other (See Comments)    Severe vomiting   Ciprofloxacin Other (See Comments)    Reaction not recalled   Latex Other (  See Comments)    Reaction not recalled   Levofloxacin Other (See Comments)    Reaction not recalled   Macrobid [Nitrofurantoin Monohyd Macro] Other (See Comments)    Reaction not recalled   Nitrofurantoin Other (See Comments)    Reaction not recalled   Sulfa Antibiotics Other (See Comments)    Reaction not recalled   Sulfamethoxazole     Reaction not recalled    Allergies as of 02/01/2022       Reactions   Donepezil Nausea And Vomiting, Other (See Comments)   Severe vomiting   Ciprofloxacin Other (See Comments)   Reaction not recalled   Latex Other (See Comments)   Reaction not recalled   Levofloxacin Other (See Comments)   Reaction not  recalled   Macrobid [nitrofurantoin Monohyd Macro] Other (See Comments)   Reaction not recalled   Nitrofurantoin Other (See Comments)   Reaction not recalled   Sulfa Antibiotics Other (See Comments)   Reaction not recalled   Sulfamethoxazole    Reaction not recalled        Medication List        Accurate as of February 01, 2022  3:41 PM. If you have any questions, ask your nurse or doctor.          apixaban 5 MG Tabs tablet Commonly known as: ELIQUIS Take 5 mg by mouth 2 (two) times daily.   atenolol 25 MG tablet Commonly known as: TENORMIN Take 25 mg by mouth daily.   cephALEXin 250 MG capsule Commonly known as: KEFLEX Take 250 mg by mouth daily.   FLUoxetine 20 MG capsule Commonly known as: PROZAC Take 20 mg by mouth at bedtime.   FLUoxetine 40 MG capsule Commonly known as: PROZAC Take 40 mg by mouth in the morning.   furosemide 20 MG tablet Commonly known as: LASIX Take 20 mg by mouth. Take Sundays and Thursday's AM   Per new patient form   levothyroxine 112 MCG tablet Commonly known as: SYNTHROID Take 112 mcg by mouth daily before breakfast.   loperamide 2 MG tablet Commonly known as: IMODIUM A-D Take 2 mg by mouth as needed for diarrhea or loose stools.   losartan 50 MG tablet Commonly known as: COZAAR Take 50 mg by mouth daily.   Omega-3 Fish Oil 1200 MG Caps Take 2 capsules by mouth daily.   rosuvastatin 20 MG tablet Commonly known as: CRESTOR TAKE ONE TABLET EVERY EVENING   Systane 0.4-0.3 % Gel ophthalmic gel Generic drug: Polyethyl Glycol-Propyl Glycol Place 1 application into both eyes at bedtime.   traMADol 50 MG tablet Commonly known as: ULTRAM Take 50 mg by mouth in the morning and at bedtime.   Vitamin D3 125 MCG (5000 UT) Tabs Take 1 tablet by mouth daily.        Review of Systems  Constitutional:  Negative for activity change and appetite change.  HENT: Negative.    Eyes:  Positive for visual disturbance.   Respiratory:  Negative for cough and shortness of breath.   Cardiovascular:  Negative for leg swelling.  Gastrointestinal:  Negative for constipation.  Genitourinary: Negative.   Musculoskeletal:  Positive for gait problem. Negative for arthralgias and myalgias.  Skin: Negative.   Neurological:  Negative for dizziness and weakness.  Psychiatric/Behavioral:  Negative for confusion, dysphoric mood and sleep disturbance.    Immunization History  Administered Date(s) Administered   Influenza Split 09/13/2017, 09/06/2018, 09/18/2019   Influenza, High Dose Seasonal PF 09/04/2021   Moderna SARS-COV2 Booster  Vaccination 10/13/2020   PFIZER(Purple Top)SARS-COV-2 Vaccination 12/16/2019, 01/16/2020   PNEUMOCOCCAL CONJUGATE-20 12/23/2021   Pertinent  Health Maintenance Due  Topic Date Due   INFLUENZA VACCINE  Completed   DEXA SCAN  Completed   Fall Risk 10/20/2019 10/21/2019 10/21/2019 10/22/2019 12/08/2021  Patient Fall Risk Level Moderate fall risk Moderate fall risk Moderate fall risk Moderate fall risk Low fall risk   Functional Status Survey:    Vitals:   02/01/22 1459  BP: 124/71  Pulse: 69  Resp: 18  Temp: 98.4 F (36.9 C)  SpO2: 97%  Weight: 196 lb (88.9 kg)   Body mass index is 28.94 kg/m. Physical Exam Vitals reviewed.  Constitutional:      Appearance: Normal appearance.  HENT:     Head: Normocephalic.     Nose: Nose normal.     Mouth/Throat:     Mouth: Mucous membranes are moist.     Pharynx: Oropharynx is clear.  Eyes:     Pupils: Pupils are equal, round, and reactive to light.  Cardiovascular:     Rate and Rhythm: Normal rate and regular rhythm.     Pulses: Normal pulses.     Heart sounds: Murmur heard.  Pulmonary:     Effort: Pulmonary effort is normal.     Breath sounds: Normal breath sounds.  Abdominal:     General: Abdomen is flat. Bowel sounds are normal.     Palpations: Abdomen is soft.  Musculoskeletal:        General: No swelling.     Cervical  back: Neck supple.  Skin:    General: Skin is warm.  Neurological:     General: No focal deficit present.     Mental Status: She is alert and oriented to person, place, and time.  Psychiatric:        Mood and Affect: Mood normal.        Thought Content: Thought content normal.    Labs reviewed: Recent Labs    12/08/21 1122 12/10/21 1641  NA 136 141  K 3.6 3.7  CL 107 103  CO2 22 23  GLUCOSE 102* 105*  BUN 21 24  CREATININE 1.01* 1.17*  CALCIUM 8.9 9.6   Recent Labs    12/08/21 1630  AST 24  ALT 24  ALKPHOS 52  BILITOT 0.6  PROT 6.0*  ALBUMIN 3.5   Recent Labs    12/08/21 1122  WBC 6.6  HGB 11.6*  HCT 35.7*  MCV 89.5  PLT 223   Lab Results  Component Value Date   TSH 0.687 10/17/2019   No results found for: HGBA1C No results found for: CHOL, HDL, LDLCALC, LDLDIRECT, TRIG, CHOLHDL  Significant Diagnostic Results in last 30 days:  ECHOCARDIOGRAM COMPLETE  Result Date: 01/13/2022    ECHOCARDIOGRAM REPORT   Patient Name:   Faith Parker Date of Exam: 01/13/2022 Medical Rec #:  [id number]      Height:       69.0 in Accession #:    [id number]     Weight:       195.0 lb Date of Birth:  [birth year]       BSA:          2.044 m Patient Age:    88 years       BP:           130/76 mmHg Patient Gender: F              HR:  90 bpm. Exam Location:  [facility] Procedure: 2D Echo, Cardiac Doppler and Color Doppler Indications:    R07.9 Chest pain  History:        Patient has prior history of Echocardiogram examinations, most                 recent 10/17/2019. Arrythmias:Atrial Fibrillation,                 Signs/Symptoms:Dyspnea and Chest Pain; Risk                 Factors:Hypertension. Previous echo showed LVEF 60% with mean Ao                 gradient of 11 mHg.  Sonographer:    Lenard Galloway BA, RDCS Referring Phys: [place]  1. Left ventricular ejection fraction, by estimation, is 60 to 65%. The left ventricle has normal function. The left ventricle has  no regional wall motion abnormalities. There is mild left ventricular hypertrophy of the basal-septal segment. Left ventricular diastolic function could not be evaluated.  2. Right ventricular systolic function is normal. The right ventricular size is normal. There is normal pulmonary artery systolic pressure. The estimated right ventricular systolic pressure is 61.4 mmHg.  3. The pericardial effusion is anterior to the right ventricle.  4. The mitral valve is normal in structure. No evidence of mitral valve regurgitation. No evidence of mitral stenosis.  5. The aortic valve is calcified. There is severe calcifcation of the aortic valve. There is severe thickening of the aortic valve. Aortic valve regurgitation is mild. Mild aortic valve stenosis. Aortic valve area, by VTI measures 3.36 cm. Aortic valve  mean gradient measures 11.0 mmHg. Aortic valve Vmax measures 2.20 m/s.  6. The inferior vena cava is normal in size with greater than 50% respiratory variability, suggesting right atrial pressure of 3 mmHg.  7. No significant change from prior study. FINDINGS  Left Ventricle: Left ventricular ejection fraction, by estimation, is 60 to 65%. The left ventricle has normal function. The left ventricle has no regional wall motion abnormalities. The left ventricular internal cavity size was normal in size. There is  mild left ventricular hypertrophy of the basal-septal segment. Left ventricular diastolic function could not be evaluated due to atrial fibrillation. Left ventricular diastolic function could not be evaluated. Right Ventricle: The right ventricular size is normal. No increase in right ventricular wall thickness. Right ventricular systolic function is normal. There is normal pulmonary artery systolic pressure. The tricuspid regurgitant velocity is 2.39 m/s, and  with an assumed right atrial pressure of 3 mmHg, the estimated right ventricular systolic pressure is 43.1 mmHg. Left Atrium: Left atrial size was  normal in size. Right Atrium: Right atrial size was normal in size. Pericardium: Trivial pericardial effusion is present. The pericardial effusion is anterior to the right ventricle. Mitral Valve: The mitral valve is normal in structure. No evidence of mitral valve regurgitation. No evidence of mitral valve stenosis. Tricuspid Valve: The tricuspid valve is normal in structure. Tricuspid valve regurgitation is trivial. No evidence of tricuspid stenosis. Aortic Valve: The aortic valve is calcified. There is severe calcifcation of the aortic valve. There is severe thickening of the aortic valve. Aortic valve regurgitation is mild. Mild aortic stenosis is present. Aortic valve mean gradient measures 11.0 mmHg. Aortic valve peak gradient measures 19.4 mmHg. Aortic valve area, by VTI measures 3.36 cm. Pulmonic Valve: The pulmonic valve was normal in structure. Pulmonic valve regurgitation is not  visualized. No evidence of pulmonic stenosis. Aorta: The aortic root is normal in size and structure. Venous: The inferior vena cava is normal in size with greater than 50% respiratory variability, suggesting right atrial pressure of 3 mmHg. IAS/Shunts: No atrial level shunt detected by color flow Doppler.  LEFT VENTRICLE PLAX 2D LVIDd:         5.00 cm   Diastology LVIDs:         3.30 cm   LV e' medial:    7.40 cm/s LV PW:         1.10 cm   LV E/e' medial:  12.7 LV IVS:        1.20 cm   LV e' lateral:   11.60 cm/s LVOT diam:     2.50 cm   LV E/e' lateral: 8.1 LV SV:         115 LV SV Index:   56 LVOT Area:     4.91 cm  RIGHT VENTRICLE             IVC RV S prime:     13.80 cm/s  IVC diam: 1.80 cm TAPSE (M-mode): 2.7 cm RVSP:           25.8 mmHg LEFT ATRIUM             Index        RIGHT ATRIUM LA diam:        5.30 cm 2.59 cm/m   RA Pressure: 3.00 mmHg LA Vol (A2C):   49.7 ml 24.32 ml/m LA Vol (A4C):   62.6 ml 30.63 ml/m LA Biplane Vol: 47.7 ml 23.34 ml/m  AORTIC VALVE AV Area (Vmax):    3.34 cm AV Area (Vmean):   3.27 cm AV  Area (VTI):     3.36 cm AV Vmax:           220.25 cm/s AV Vmean:          147.750 cm/s AV VTI:            0.342 m AV Peak Grad:      19.4 mmHg AV Mean Grad:      11.0 mmHg LVOT Vmax:         149.75 cm/s LVOT Vmean:        98.425 cm/s LVOT VTI:          0.234 m LVOT/AV VTI ratio: 0.68  AORTA Ao Root diam: 3.40 cm MITRAL VALVE               TRICUSPID VALVE MV Area (PHT): 4.39 cm    TR Peak grad:   22.8 mmHg MV Decel Time: 173 msec    TR Vmax:        239.00 cm/s MV E velocity: 93.80 cm/s  Estimated RAP:  3.00 mmHg MV A velocity: 48.20 cm/s  RVSP:           25.8 mmHg MV E/A ratio:  1.95                            SHUNTS                            Systemic VTI:  0.23 m                            Systemic Diam: 2.50 cm Fransico Him MD Electronically  signed by Fransico Him MD Signature Date/Time: 01/13/2022/2:06:08 PM    Final     Assessment/Plan 1. Paroxysmal atrial fibrillation (HCC) Stable on Eliquis and Tenormin Now moved to [place] avoid Meds Confusion  2. Pacemaker Follows with Cardiology  3. Stage 3a chronic kidney disease (HCC) Creat stable Repeat Labs  4. Hyperlipidemia, unspecified hyperlipidemia type On Statin Repeat Labs  5. Recurrent depression ([place]) On High Dose of Prozac Will watch for any side effects Possible GDR /  6. Peripheral edema On Low dose of Lasix  7. Primary osteoarthritis involving multiple joints Tramadol prescription by [facility]  8. Blindness of left eye, unspecified right eye visual impairment category Moved to [place] now  9. Recurrent UTI On Suppressive Keflex  10. Age-related osteoporosis without current pathological fracture ? Follows with Dr Amil Amen  11 Hypothyroidism Repeat TSH  Family/ staff Communication:   Labs/tests ordered:

## 2022-02-02 ENCOUNTER — Other Ambulatory Visit: Payer: Self-pay | Admitting: Orthopedic Surgery

## 2022-02-02 ENCOUNTER — Encounter: Payer: Self-pay | Admitting: Orthopedic Surgery

## 2022-02-02 ENCOUNTER — Non-Acute Institutional Stay: Payer: Medicare Other | Admitting: Orthopedic Surgery

## 2022-02-02 DIAGNOSIS — R42 Dizziness and giddiness: Secondary | ICD-10-CM | POA: Diagnosis not present

## 2022-02-02 DIAGNOSIS — M1990 Unspecified osteoarthritis, unspecified site: Secondary | ICD-10-CM

## 2022-02-02 DIAGNOSIS — G44209 Tension-type headache, unspecified, not intractable: Secondary | ICD-10-CM

## 2022-02-02 MED ORDER — TRAMADOL HCL 50 MG PO TABS: 50.0000 mg | ORAL_TABLET | Freq: Every day | ORAL | 0 refills | Status: DC

## 2022-02-02 NOTE — Progress Notes (Signed)
Location:  [facility] Room Number: KG818/H Place of Service:  [facility] [phone]) Provider: Yvonna Alanis, NP  Patient Care Team: Mast, Man X, NP as PCP - General (Internal Medicine) Fay Records, MD as PCP - Cardiology (Cardiology)  Extended Emergency Contact Information Primary Emergency Contact: May,Sharon Address: [address]           [place], [place] [address] [name] of [place] Phone: [phone] Mobile Phone: [phone] Relation: Daughter Secondary Emergency Contact: Henderson,Miriam Address: [address]          [place], [place] [address] [name] of [place] Phone: [phone] Mobile Phone: [phone] Relation: Daughter  Code Status:  DNR Goals of care: Advanced Directive information Advanced Directives 02/02/2022  Does Patient Have a Medical Advance Directive? Yes  Type of Paramedic of [place];Living will  Does patient want to make changes to medical advance directive? No - Patient declined  Copy of [place] in Chart? Yes - validated most recent copy scanned in chart (See row information)     Chief Complaint  Patient presents with   Acute Visit    Headaches and dizziness    HPI:  Pt is a 88 y.o. female seen today for an acute visit for dizziness and headache.   This morning she woke up with increased dizziness. Dizziness increased with movement, especially sit to stand. She reports having a history of dizziness in past. She has been ambulating less today. No recent falls. In addition, she woke up with a headache. Described as "tension/pressure." She was offered tylenol but refused. She was then given tramadol 50 mg with her morning medications. Denies headache at this time. Denies chest pain, sob, nausea or vomiting.   Morning blood pressure elevated 162/94. She was given atenolol and losartan this morning. Manual blood pressure 128/76 during encounter.   Past Medical History:   Diagnosis Date   Anxiety    Aortic regurgitation    Atrial fibrillation (HCC)    Chest pain    Per new patient form   Gastritis    GERD (gastroesophageal reflux disease)    H/O artificial eye lens    Hypertension    Hypothyroid    Left ventricular hypertrophy    Major depression    Osteoarthritis    Osteoporosis    Pacemaker    On Eliquis, Per new patient form   Peptic ulcer disease    Stress incontinence    Past Surgical History:  Procedure Laterality Date   ABDOMINAL SURGERY     Per new patient form   BLADDER SURGERY     DG  BONE DENSITY ([place] HX)     Per new patient form   EYE SURGERY     KNEE ARTHROPLASTY     LEFT HEART CATH AND CORONARY ANGIOGRAPHY N/A 10/22/2019   Procedure: LEFT HEART CATH AND CORONARY ANGIOGRAPHY;  Surgeon: Sherren Mocha, MD;  Location: [facility];  Service: Cardiovascular;  Laterality: N/A;   LEG SURGERY Right    multiple surgeries, unknown type   PACEMAKER IMPLANT N/A 06/04/2018   Procedure: PACEMAKER IMPLANT;  Surgeon: Deboraha Sprang, MD;  Location: [facility];  Service: Cardiovascular;  Laterality: N/A;    Allergies  Allergen Reactions   Donepezil Nausea And Vomiting and Other (See Comments)    Severe vomiting   Ciprofloxacin Other (See Comments)    Reaction not recalled   Latex Other (See Comments)    Reaction  not recalled   Levofloxacin Other (See Comments)    Reaction not recalled   Macrobid [Nitrofurantoin Monohyd Macro] Other (See Comments)    Reaction not recalled   Nitrofurantoin Other (See Comments)    Reaction not recalled   Sulfa Antibiotics Other (See Comments)    Reaction not recalled   Sulfamethoxazole     Reaction not recalled    Outpatient Encounter Medications as of 02/02/2022  Medication Sig   acetaminophen (TYLENOL) 325 MG tablet Take 650 mg by mouth every 4 (four) hours as needed.   apixaban (ELIQUIS) 5 MG TABS tablet Take 5 mg by mouth 2 (two) times daily.   atenolol (TENORMIN) 25 MG  tablet Take 25 mg by mouth daily.    cephALEXin (KEFLEX) 250 MG capsule Take 250 mg by mouth daily.   Cholecalciferol (VITAMIN D3) 125 MCG (5000 UT) TABS Take 1 tablet by mouth daily.   FLUoxetine (PROZAC) 20 MG capsule Take 20 mg by mouth at bedtime.   FLUoxetine (PROZAC) 40 MG capsule Take 40 mg by mouth in the morning.   furosemide (LASIX) 20 MG tablet Take 20 mg by mouth. Take Sundays and Thursday's AM   Per new patient form   levothyroxine (SYNTHROID) 112 MCG tablet Take 112 mcg by mouth daily before breakfast.   losartan (COZAAR) 50 MG tablet Take 50 mg by mouth daily.   Omega-3 Fatty Acids (OMEGA-3 FISH OIL) 1200 MG CAPS Take 2 capsules by mouth daily.   Polyethyl Glycol-Propyl Glycol (SYSTANE) 0.4-0.3 % GEL ophthalmic gel Place 1 application into both eyes at bedtime.   rosuvastatin (CRESTOR) 20 MG tablet TAKE ONE TABLET EVERY EVENING   traMADol (ULTRAM) 50 MG tablet Take 1 tablet (50 mg total) by mouth daily.   [DISCONTINUED] loperamide (IMODIUM A-D) 2 MG tablet Take 2 mg by mouth as needed for diarrhea or loose stools. (Patient not taking: Reported on 02/02/2022)   No facility-administered encounter medications on file as of 02/02/2022.    Review of Systems  Constitutional:  Negative for activity change, appetite change, chills, fatigue and fever.  HENT:  Negative for congestion and sore throat.   Eyes:  Negative for visual disturbance.  Respiratory:  Negative for cough, shortness of breath and wheezing.   Cardiovascular:  Negative for chest pain and leg swelling.  Gastrointestinal:  Negative for nausea and vomiting.  Genitourinary:  Negative for dysuria, frequency and hematuria.  Musculoskeletal:  Positive for arthralgias and gait problem.  Skin:  Negative for wound.  Neurological:  Positive for dizziness and headaches.  Psychiatric/Behavioral:  Negative for dysphoric mood. The patient is not nervous/anxious.    Immunization History  Administered Date(s) Administered    Influenza Split 09/13/2017, 09/06/2018, 09/18/2019   Influenza, High Dose Seasonal PF 09/04/2021   Moderna SARS-COV2 Booster Vaccination 10/13/2020   PFIZER(Purple Top)SARS-COV-2 Vaccination 12/16/2019, 01/16/2020   PNEUMOCOCCAL CONJUGATE-20 12/23/2021   Pertinent  Health Maintenance Due  Topic Date Due   INFLUENZA VACCINE  Completed   DEXA SCAN  Completed   Fall Risk 10/20/2019 10/21/2019 10/21/2019 10/22/2019 12/08/2021  Patient Fall Risk Level Moderate fall risk Moderate fall risk Moderate fall risk Moderate fall risk Low fall risk   Functional Status Survey:    Vitals:   02/02/22 1415  BP: (!) 162/94  Pulse: 70  Resp: 18  Temp: (!) 97.5 F (36.4 C)  SpO2: 96%  Weight: 199 lb 9.6 oz (90.5 kg)  Height: 5\' 9"  (1.753 m)   Body mass index is 29.48 kg/m. Physical Exam  Vitals reviewed.  Constitutional:      General: She is not in acute distress. HENT:     Head: Normocephalic.  Eyes:     General:        Right eye: No discharge.        Left eye: No discharge.     Extraocular Movements:     Right eye: Normal extraocular motion and no nystagmus.     Left eye: Normal extraocular motion and no nystagmus.  Neck:     Vascular: No carotid bruit.  Cardiovascular:     Rate and Rhythm: Normal rate and regular rhythm.     Pulses: Normal pulses.     Heart sounds: Normal heart sounds.     Comments: pacemaker Pulmonary:     Effort: Pulmonary effort is normal. No respiratory distress.     Breath sounds: Normal breath sounds. No wheezing.  Abdominal:     General: Bowel sounds are normal. There is no distension.     Palpations: Abdomen is soft.     Tenderness: There is no abdominal tenderness.  Musculoskeletal:     Cervical back: Neck supple.     Right lower leg: No edema.     Left lower leg: No edema.  Lymphadenopathy:     Cervical: No cervical adenopathy.  Skin:    General: Skin is warm and dry.     Capillary Refill: Capillary refill takes less than 2 seconds.   Neurological:     General: No focal deficit present.     Mental Status: She is alert and oriented to person, place, and time.     Motor: No weakness.     Gait: Gait normal.  Psychiatric:        Mood and Affect: Mood normal.        Behavior: Behavior normal.    Labs reviewed: Recent Labs    12/08/21 1122 12/10/21 1641  NA 136 141  K 3.6 3.7  CL 107 103  CO2 22 23  GLUCOSE 102* 105*  BUN 21 24  CREATININE 1.01* 1.17*  CALCIUM 8.9 9.6   Recent Labs    12/08/21 1630  AST 24  ALT 24  ALKPHOS 52  BILITOT 0.6  PROT 6.0*  ALBUMIN 3.5   Recent Labs    12/08/21 1122  WBC 6.6  HGB 11.6*  HCT 35.7*  MCV 89.5  PLT 223   Lab Results  Component Value Date   TSH 0.687 10/17/2019   No results found for: HGBA1C No results found for: CHOL, HDL, LDLCALC, LDLDIRECT, TRIG, CHOLHDL  Significant Diagnostic Results in last 30 days:  ECHOCARDIOGRAM COMPLETE  Result Date: 01/13/2022    ECHOCARDIOGRAM REPORT   Patient Name:   Faith Parker Date of Exam: 01/13/2022 Medical Rec #:  [id number]      Height:       69.0 in Accession #:    [id number]     Weight:       195.0 lb Date of Birth:  [birth year]       BSA:          2.044 m Patient Age:    89 years       BP:           130/76 mmHg Patient Gender: F              HR:           90 bpm. Exam Location:  [facility] Procedure: 2D Echo, Cardiac Doppler and  Color Doppler Indications:    R07.9 Chest pain  History:        Patient has prior history of Echocardiogram examinations, most                 recent 10/17/2019. Arrythmias:Atrial Fibrillation,                 Signs/Symptoms:Dyspnea and Chest Pain; Risk                 Factors:Hypertension. Previous echo showed LVEF 60% with mean Ao                 gradient of 11 mHg.  Sonographer:    Lenard Galloway BA, RDCS Referring Phys: Mcgwire Dasaro  1. Left ventricular ejection fraction, by estimation, is 60 to 65%. The left ventricle has normal function. The left ventricle has no regional wall  motion abnormalities. There is mild left ventricular hypertrophy of the basal-septal segment. Left ventricular diastolic function could not be evaluated.  2. Right ventricular systolic function is normal. The right ventricular size is normal. There is normal pulmonary artery systolic pressure. The estimated right ventricular systolic pressure is 57.3 mmHg.  3. The pericardial effusion is anterior to the right ventricle.  4. The mitral valve is normal in structure. No evidence of mitral valve regurgitation. No evidence of mitral stenosis.  5. The aortic valve is calcified. There is severe calcifcation of the aortic valve. There is severe thickening of the aortic valve. Aortic valve regurgitation is mild. Mild aortic valve stenosis. Aortic valve area, by VTI measures 3.36 cm. Aortic valve  mean gradient measures 11.0 mmHg. Aortic valve Vmax measures 2.20 m/s.  6. The inferior vena cava is normal in size with greater than 50% respiratory variability, suggesting right atrial pressure of 3 mmHg.  7. No significant change from prior study. FINDINGS  Left Ventricle: Left ventricular ejection fraction, by estimation, is 60 to 65%. The left ventricle has normal function. The left ventricle has no regional wall motion abnormalities. The left ventricular internal cavity size was normal in size. There is  mild left ventricular hypertrophy of the basal-septal segment. Left ventricular diastolic function could not be evaluated due to atrial fibrillation. Left ventricular diastolic function could not be evaluated. Right Ventricle: The right ventricular size is normal. No increase in right ventricular wall thickness. Right ventricular systolic function is normal. There is normal pulmonary artery systolic pressure. The tricuspid regurgitant velocity is 2.39 m/s, and  with an assumed right atrial pressure of 3 mmHg, the estimated right ventricular systolic pressure is 22.0 mmHg. Left Atrium: Left atrial size was normal in size.  Right Atrium: Right atrial size was normal in size. Pericardium: Trivial pericardial effusion is present. The pericardial effusion is anterior to the right ventricle. Mitral Valve: The mitral valve is normal in structure. No evidence of mitral valve regurgitation. No evidence of mitral valve stenosis. Tricuspid Valve: The tricuspid valve is normal in structure. Tricuspid valve regurgitation is trivial. No evidence of tricuspid stenosis. Aortic Valve: The aortic valve is calcified. There is severe calcifcation of the aortic valve. There is severe thickening of the aortic valve. Aortic valve regurgitation is mild. Mild aortic stenosis is present. Aortic valve mean gradient measures 11.0 mmHg. Aortic valve peak gradient measures 19.4 mmHg. Aortic valve area, by VTI measures 3.36 cm. Pulmonic Valve: The pulmonic valve was normal in structure. Pulmonic valve regurgitation is not visualized. No evidence of pulmonic stenosis. Aorta: The aortic root is normal in  size and structure. Venous: The inferior vena cava is normal in size with greater than 50% respiratory variability, suggesting right atrial pressure of 3 mmHg. IAS/Shunts: No atrial level shunt detected by color flow Doppler.  LEFT VENTRICLE PLAX 2D LVIDd:         5.00 cm   Diastology LVIDs:         3.30 cm   LV e' medial:    7.40 cm/s LV PW:         1.10 cm   LV E/e' medial:  12.7 LV IVS:        1.20 cm   LV e' lateral:   11.60 cm/s LVOT diam:     2.50 cm   LV E/e' lateral: 8.1 LV SV:         115 LV SV Index:   56 LVOT Area:     4.91 cm  RIGHT VENTRICLE             IVC RV S prime:     13.80 cm/s  IVC diam: 1.80 cm TAPSE (M-mode): 2.7 cm RVSP:           25.8 mmHg LEFT ATRIUM             Index        RIGHT ATRIUM LA diam:        5.30 cm 2.59 cm/m   RA Pressure: 3.00 mmHg LA Vol (A2C):   49.7 ml 24.32 ml/m LA Vol (A4C):   62.6 ml 30.63 ml/m LA Biplane Vol: 47.7 ml 23.34 ml/m  AORTIC VALVE AV Area (Vmax):    3.34 cm AV Area (Vmean):   3.27 cm AV Area (VTI):      3.36 cm AV Vmax:           220.25 cm/s AV Vmean:          147.750 cm/s AV VTI:            0.342 m AV Peak Grad:      19.4 mmHg AV Mean Grad:      11.0 mmHg LVOT Vmax:         149.75 cm/s LVOT Vmean:        98.425 cm/s LVOT VTI:          0.234 m LVOT/AV VTI ratio: 0.68  AORTA Ao Root diam: 3.40 cm MITRAL VALVE               TRICUSPID VALVE MV Area (PHT): 4.39 cm    TR Peak grad:   22.8 mmHg MV Decel Time: 173 msec    TR Vmax:        239.00 cm/s MV E velocity: 93.80 cm/s  Estimated RAP:  3.00 mmHg MV A velocity: 48.20 cm/s  RVSP:           25.8 mmHg MV E/A ratio:  1.95                            SHUNTS                            Systemic VTI:  0.23 m                            Systemic Diam: 2.50 cm Fransico Him MD Electronically signed by Fransico Him MD Signature Date/Time: 01/13/2022/2:06:08 PM    Final  Assessment/Plan 1. Dizziness - exam unremarkable - hx of dizziness in past - trial Claritin 10 mg po daily x 14 days - orthostatic blood pressures daily x 2 days - discussed falls safety precautions  2. Tension headache - suspect related to elevated bp - resolved after taking atenolol, losartan and tramadol  '   Family/ staff Communication: plan discussed with patient and nurse  Labs/tests ordered:  orthostatic bp daily x 2 days

## 2022-02-04 ENCOUNTER — Telehealth: Payer: Self-pay | Admitting: *Deleted

## 2022-02-04 NOTE — Telephone Encounter (Signed)
Patient daughter, Ivin Booty called and stated that she would like me to send a message to Dr. Lyndel Safe.  ?Stated that patient was seen by Dr. Lyndel Safe on 02/01/2022, stated that patient is a new patient and she just wanted to let Dr. Lyndel Safe know that Previous Dr. Was watching patient's thyroid. Stated that it has been abnormal. ? ?She has also called [facility]. Also has had records transferred from Previous Dr. Last week.  ? ?FYI ?

## 2022-02-07 ENCOUNTER — Encounter: Payer: Self-pay | Admitting: Nurse Practitioner

## 2022-02-07 ENCOUNTER — Non-Acute Institutional Stay: Payer: Medicare Other | Admitting: Nurse Practitioner

## 2022-02-07 DIAGNOSIS — N183 Chronic kidney disease, stage 3 unspecified: Secondary | ICD-10-CM | POA: Insufficient documentation

## 2022-02-07 DIAGNOSIS — F339 Major depressive disorder, recurrent, unspecified: Secondary | ICD-10-CM | POA: Insufficient documentation

## 2022-02-07 DIAGNOSIS — I10 Essential (primary) hypertension: Secondary | ICD-10-CM

## 2022-02-07 DIAGNOSIS — I480 Paroxysmal atrial fibrillation: Secondary | ICD-10-CM

## 2022-02-07 DIAGNOSIS — E039 Hypothyroidism, unspecified: Secondary | ICD-10-CM

## 2022-02-07 DIAGNOSIS — M159 Polyosteoarthritis, unspecified: Secondary | ICD-10-CM | POA: Insufficient documentation

## 2022-02-07 DIAGNOSIS — E785 Hyperlipidemia, unspecified: Secondary | ICD-10-CM | POA: Diagnosis not present

## 2022-02-07 DIAGNOSIS — N390 Urinary tract infection, site not specified: Secondary | ICD-10-CM | POA: Diagnosis not present

## 2022-02-07 DIAGNOSIS — N1831 Chronic kidney disease, stage 3a: Secondary | ICD-10-CM

## 2022-02-07 DIAGNOSIS — H5440 Blindness, one eye, unspecified eye: Secondary | ICD-10-CM

## 2022-02-07 DIAGNOSIS — R609 Edema, unspecified: Secondary | ICD-10-CM

## 2022-02-07 NOTE — Assessment & Plan Note (Addendum)
Blood pressure runs low,  Will reduce Losartan to '25mg'$  qd, continue Atenolol ?

## 2022-02-07 NOTE — Assessment & Plan Note (Addendum)
Occasional edema  BLE, taking Furosemide, BNP 577 12/08/21, 01/13/22 echo EF 60-65% ?

## 2022-02-07 NOTE — Progress Notes (Signed)
Provider:  Schylar Wuebker, NP Location:   [facility] Room Number: 904-A Place of Service:  SNF (31)  PCP: Phuc Kluttz X, NP Patient Care Team: Nhi Butrum X, NP as PCP - General (Internal Medicine) Fay Records, MD as PCP - Cardiology (Cardiology)  Extended Emergency Contact Information Primary Emergency Contact: May,Sharon Address: [address]           [place], [place] [address] [name] of [place] Phone: [phone] Mobile Phone: [phone] Relation: Daughter Secondary Emergency Contact: Henderson,Miriam Address: [address]          [place], [place] [address] [name] of [place] Phone: [phone] Mobile Phone: [phone] Relation: Daughter  Code Status: DNR Goals of Care: Advanced Directive information Advanced Directives 02/07/2022  Does Patient Have a Medical Advance Directive? Yes  Type of Paramedic of [place];Living will;Out of facility DNR (pink MOST or yellow form)  Does patient want to make changes to medical advance directive? No - Patient declined  Copy of [place] in Chart? No - copy requested      Chief Complaint  Patient presents with   Acute Visit    F/u dizziness.     HPI: Patient is a 88 y.o. female seen today to f/u dizziness, resolved, noted Bp 116/80 sitting-98/80 standing, again 102/69 sitting-96/80 standing.   Afib, taking Atenolol, Eliquis, Hgb 11/6 12/08/21  Pacemaker, EKG 12/09/21 paced rhythm.   UTI suppression therapy Keflex 51m qd  Depression, on Fluoxetine  Edema BLE, taking Furosemide, BNP 577 12/08/21, 01/13/22 echo EF 60-65%  Hypothyroidism, on Levothyroxine  HTN, on Losartan, Atenolol  CKD Bun/cret 24/1.17 eGFR 45 12/10/21  Hyperlipidemia, on Rosuvastatin  OA, taking Tramadol    Past Medical History:  Diagnosis Date   Anxiety    Aortic regurgitation    Atrial fibrillation (HCC)    Chest pain    Per new patient form   Gastritis    GERD  (gastroesophageal reflux disease)    H/O artificial eye lens    Hypertension    Hypothyroid    Left ventricular hypertrophy    Major depression    Osteoarthritis    Osteoporosis    Pacemaker    On Eliquis, Per new patient form   Peptic ulcer disease    Stress incontinence    Past Surgical History:  Procedure Laterality Date   ABDOMINAL SURGERY     Per new patient form   BLADDER SURGERY     DG  BONE DENSITY ([facility])     Per new patient form   EYE SURGERY     KNEE ARTHROPLASTY     LEFT HEART CATH AND CORONARY ANGIOGRAPHY N/A 10/22/2019   Procedure: LEFT HEART CATH AND CORONARY ANGIOGRAPHY;  Surgeon: CSherren Mocha MD;  [facility];  Service: Cardiovascular;  Laterality: N/A;   LEG SURGERY Right    multiple surgeries, unknown type   PACEMAKER IMPLANT N/A 06/04/2018   Procedure: PACEMAKER IMPLANT;  Surgeon: KDeboraha Sprang MD;  [facility];  Service: Cardiovascular;  Laterality: N/A;    reports that she has never smoked. She has never used smokeless tobacco. She reports that she does not drink alcohol and does not use drugs. Social History   Socioeconomic History   Marital status: Widowed    Spouse name: Not on file   Number of children: Not on file   Years of education: Not on file   Highest education  level: Not on file  Occupational History   Occupation: Retired  Tobacco Use   Smoking status: Never   Smokeless tobacco: Never  Vaping Use   Vaping Use: Never used  Substance and Sexual Activity   Alcohol use: No   Drug use: No   Sexual activity: Not on file  Other Topics Concern   Not on file  Social History Narrative   Diet: Regular       Caffeine: Coffee and coke      Married, if yes what year: Widow      Do you live in a house, apartment, assisted living, condo, trailer, ect: Currently living at [facility]. Want to move to assisted living      Is it one or more stories: one      How many persons live in your  home? 1       Pets: no      Highest level or education completed: 2 years college       Current/Past profession: Network engineer      Exercise:  Walk and drumming                 Type and how often: 1x each weekly         Living Will: Yes    DNR: No if not, do you wish to discuss one? Yes   POA/HPOA: Yes      Functional Status:    Do you have difficulty bathing or dressing yourself? Some yes   Do you have difficulty preparing food or eating? Some yes   Do you have difficulty managing your medications? Yes   Do you have difficulty managing your finances? Yes   Do you have difficulty affording your medications? No   Social Determinants of Radio broadcast assistant Strain: Not on file  Food Insecurity: Not on file  Transportation Needs: Not on file  Physical Activity: Not on file  Stress: Not on file  Social Connections: Not on file  Intimate Partner Violence: Not on file    Functional Status Survey:    Family History  Problem Relation Age of Onset   Congestive Heart Failure Mother    Arthritis Mother    Heart attack Father    Leukemia Sister    Coronary artery disease Neg Hx     Health Maintenance  Topic Date Due   TETANUS/TDAP  Never done   Zoster Vaccines- Shingrix (1 of 2) Never done   COVID-19 Vaccine (3 - Pfizer risk series) 11/10/2020   Pneumonia Vaccine 59+ Years old  Completed   INFLUENZA VACCINE  Completed   DEXA SCAN  Completed   HPV VACCINES  Aged Out    Allergies  Allergen Reactions   Donepezil Nausea And Vomiting and Other (See Comments)    Severe vomiting   Ciprofloxacin Other (See Comments)    Reaction not recalled   Latex Other (See Comments)    Reaction not recalled   Levofloxacin Other (See Comments)    Reaction not recalled   Macrobid [Nitrofurantoin Monohyd Macro] Other (See Comments)    Reaction not recalled   Nitrofurantoin Other (See Comments)    Reaction not recalled   Sulfa Antibiotics Other (See Comments)    Reaction not  recalled   Sulfamethoxazole     Reaction not recalled    Allergies as of 02/07/2022       Reactions   Donepezil Nausea And Vomiting, Other (See Comments)   Severe vomiting  Ciprofloxacin Other (See Comments)   Reaction not recalled   Latex Other (See Comments)   Reaction not recalled   Levofloxacin Other (See Comments)   Reaction not recalled   Macrobid [nitrofurantoin Monohyd Macro] Other (See Comments)   Reaction not recalled   Nitrofurantoin Other (See Comments)   Reaction not recalled   Sulfa Antibiotics Other (See Comments)   Reaction not recalled   Sulfamethoxazole    Reaction not recalled        Medication List        Accurate as of February 07, 2022 11:59 PM. If you have any questions, ask your nurse or doctor.          STOP taking these medications    acetaminophen 325 MG tablet Commonly known as: TYLENOL Stopped by: Shanta Hartner X Jeovanni Heuring, NP       TAKE these medications    apixaban 5 MG Tabs tablet Commonly known as: ELIQUIS Take 5 mg by mouth 2 (two) times daily.   atenolol 25 MG tablet Commonly known as: TENORMIN Take 25 mg by mouth daily.   cephALEXin 250 MG capsule Commonly known as: KEFLEX Take 250 mg by mouth daily.   FLUoxetine 20 MG capsule Commonly known as: PROZAC Take 20 mg by mouth at bedtime.   FLUoxetine 40 MG capsule Commonly known as: PROZAC Take 40 mg by mouth in the morning.   furosemide 20 MG tablet Commonly known as: LASIX Take 20 mg by mouth. Take Sundays and Thursday's AM   Per new patient form   levothyroxine 112 MCG tablet Commonly known as: SYNTHROID Take 112 mcg by mouth daily before breakfast.   loratadine 10 MG tablet Commonly known as: CLARITIN Take 10 mg by mouth daily.   losartan 50 MG tablet Commonly known as: COZAAR Take 50 mg by mouth daily.   Omega-3 Fish Oil 1200 MG Caps Take 2 capsules by mouth daily.   rosuvastatin 20 MG tablet Commonly known as: CRESTOR TAKE ONE TABLET EVERY EVENING    Systane 0.4-0.3 % Gel ophthalmic gel Generic drug: Polyethyl Glycol-Propyl Glycol Place 1 application into both eyes at bedtime.   traMADol 50 MG tablet Commonly known as: ULTRAM Take 1 tablet (50 mg total) by mouth daily.   Vitamin D3 125 MCG (5000 UT) Tabs Take 1 tablet by mouth daily.        Review of Systems  Constitutional:  Negative for appetite change, fatigue and fever.  HENT:  Negative for congestion and trouble swallowing.   Eyes:  Positive for visual disturbance.       Left eye blind, failed multiple cornea transplants   Respiratory:  Negative for cough, shortness of breath and wheezing.   Cardiovascular:  Positive for leg swelling. Negative for chest pain and palpitations.  Gastrointestinal:  Negative for abdominal pain and constipation.  Genitourinary:  Negative for dysuria and frequency.  Musculoskeletal:  Positive for arthralgias. Negative for gait problem.  Skin:  Negative for color change.  Neurological:  Negative for dizziness, speech difficulty and headaches.  Psychiatric/Behavioral:  Negative for behavioral problems, dysphoric mood and sleep disturbance. The patient is not nervous/anxious.    Vitals:   02/07/22 1050  BP: 102/64  Pulse: 80  Resp: 18  Temp: 97.9 F (36.6 C)  SpO2: 96%  Weight: 199 lb 9.6 oz (90.5 kg)  Height: 5' 9"  (1.753 m)   Body mass index is 29.48 kg/m. Physical Exam Vitals and nursing note reviewed.  Constitutional:      Appearance: Normal  appearance.  HENT:     Head: Normocephalic and atraumatic.     Nose: Nose normal.     Mouth/Throat:     Mouth: Mucous membranes are moist.  Eyes:     Extraocular Movements: Extraocular movements intact.     Conjunctiva/sclera: Conjunctivae normal.     Pupils: Pupils are equal, round, and reactive to light.  Cardiovascular:     Rate and Rhythm: Normal rate and regular rhythm.     Heart sounds: Murmur heard.     Comments: pacemaker Pulmonary:     Effort: Pulmonary effort is normal.   Abdominal:     General: Bowel sounds are normal.     Palpations: Abdomen is soft.     Tenderness: There is no abdominal tenderness.  Musculoskeletal:     Cervical back: Normal range of motion and neck supple.     Right lower leg: No edema.     Left lower leg: No edema.  Skin:    General: Skin is warm and dry.  Neurological:     General: No focal deficit present.     Mental Status: She is alert and oriented to person, place, and time. Mental status is at baseline.     Motor: No weakness.     Gait: Gait normal.  Psychiatric:        Mood and Affect: Mood normal.        Behavior: Behavior normal.        Thought Content: Thought content normal.        Judgment: Judgment normal.    Labs reviewed: Basic Metabolic Panel: Recent Labs    12/08/21 1122 12/10/21 1641  NA 136 141  K 3.6 3.7  CL 107 103  CO2 22 23  GLUCOSE 102* 105*  BUN 21 24  CREATININE 1.01* 1.17*  CALCIUM 8.9 9.6   Liver Function Tests: Recent Labs    12/08/21 1630  AST 24  ALT 24  ALKPHOS 52  BILITOT 0.6  PROT 6.0*  ALBUMIN 3.5   Recent Labs    12/08/21 1630  LIPASE 45   No results for input(s): AMMONIA in the last 8760 hours. CBC: Recent Labs    12/08/21 1122  WBC 6.6  HGB 11.6*  HCT 35.7*  MCV 89.5  PLT 223   Cardiac Enzymes: No results for input(s): CKTOTAL, CKMB, CKMBINDEX, TROPONINI in the last 8760 hours. BNP: Invalid input(s): POCBNP No results found for: HGBA1C Lab Results  Component Value Date   TSH 0.687 10/17/2019   Lab Results  Component Value Date   VITAMINB12 615 01/21/2017   No results found for: FOLATE No results found for: IRON, TIBC, FERRITIN  Imaging and Procedures obtained prior to SNF admission: DG Chest 1 View  Result Date: 12/08/2021 CLINICAL DATA:  Chest pain, shortness of breath. EXAM: CHEST  1 VIEW COMPARISON:  November 07, 2019. FINDINGS: Stable cardiomediastinal silhouette. Left-sided pacemaker is unchanged in position. Mild bibasilar subsegmental  atelectasis is noted. Bony thorax is unremarkable. IMPRESSION: Mild bibasilar subsegmental atelectasis. Electronically Signed   By: Marijo Conception M.D.   On: 12/08/2021 11:30    Assessment/Plan Hypertension Blood pressure runs low,  Will reduce Losartan to 58m qd, continue Atenolol  CKD (chronic kidney disease) stage 3, GFR 30-59 ml/min (HCC) Bun/cret 24/1.17 eGFR 45 12/10/21  Hyperlipidemia on Rosuvastatin, obtain lipid panel.   Chronic UTI Continue Keflex for suppression therapy.   Osteoarthritis, multiple sites Continue Tramadol.   Hypothyroidism 10/17/19 TSH 0.687, continue Levothyroxine, repeat TSH  Peripheral edema Occasional edema  BLE, taking Furosemide, BNP 577 12/08/21, 01/13/22 echo EF 60-65%  Recurrent depression (HCC) Her mood is stable, on Fluoxetine.   PAF (paroxysmal atrial fibrillation) ([facility]) taking Atenolol, Eliquis, Hgb 11/6 12/08/21  Blind left eye Left eye blind, failed multiple cornea transplants      Family/ staff Communication: plan of care reviewed with the patient and charge nurse.   Labs/tests ordered: TSH, lipid panel.   Time spend 35 minutes.

## 2022-02-07 NOTE — Assessment & Plan Note (Signed)
Bun/cret 24/1.17 eGFR 45 12/10/21 ?

## 2022-02-07 NOTE — Assessment & Plan Note (Signed)
Her mood is stable, on Fluoxetine.  ?

## 2022-02-07 NOTE — Assessment & Plan Note (Signed)
taking Atenolol, Eliquis, Hgb 11/6 12/08/21 ?

## 2022-02-07 NOTE — Assessment & Plan Note (Signed)
Left eye blind, failed multiple cornea transplants  ?

## 2022-02-07 NOTE — Assessment & Plan Note (Signed)
Continue Keflex for suppression therapy.  ?

## 2022-02-07 NOTE — Assessment & Plan Note (Signed)
10/17/19 TSH 0.687, continue Levothyroxine, repeat TSH ?

## 2022-02-07 NOTE — Assessment & Plan Note (Signed)
Continue Tramadol.  ?

## 2022-02-07 NOTE — Assessment & Plan Note (Addendum)
on Rosuvastatin, obtain lipid panel.  ?

## 2022-02-08 LAB — LIPID PANEL
Cholesterol: 138 (ref 0–200)
HDL: 71 — AB (ref 35–70)
LDL Cholesterol: 55
Triglycerides: 42 (ref 40–160)

## 2022-02-08 LAB — TSH: TSH: 0.09 — AB (ref 0.41–5.90)

## 2022-02-11 ENCOUNTER — Encounter: Payer: Self-pay | Admitting: Neurology

## 2022-02-13 NOTE — Progress Notes (Signed)
Cardiology Office Note   Date:  02/14/2022   ID:  Faith Parker, DOB [birth year], MRN [id number]  PCP:  Mast, Man X, NP  Cardiologist:   Dorris Carnes, MD   Patient presents for f/u of PAF   History of Present Illness: Faith Parker is a 88 y.o. female with a history of HTN, aortic stenosis /insuff, hypothyroidism and PAF  Echo with mild AS, AI.  CT Aorta 4.1 cm   In June 2019 the pt was admitted to [facility] for [facility]   Found to have 6 to 7 sec pauses as well as  PAF with RVR  Underwent placement of PPM  IN Nov 2019 the pt was aditted with dyspnea     Echo done:  Mild AS   Myovue showed ischemia.   She went on to have L heart cath that showed minimal nonobstructive CAD      I saw the Jan 2022    The pt ws seen by Olin Pia in 11/01/21  At that visit complained of fatigue  Constant  Vision poor   Afib represented 4% total   (Did not sense)   JVP was up a little on exam   Recomm lasix x 3 days   On 12/08/20 she was seen in ED.  She complained of chest pressure that started at about 5 am   Resoved by time in ED    Troponin negative   CXR sugg of increased fluid   BNP mldly increased   She was told to take lasix 20 mg for 4 days  [name]   Denied CP but was very fatigued    Echo was done that showed LVEF ormal   AV was mildly stenotic  Since seen the pt says she still get fatigued   Isnt doing that much    [name]    This is insuring that she is gettig meds (daughter says prior to tx to this level of care she was not taking meds as directed)  She denies CP   breaghing is fair  Current Meds  Medication Sig   apixaban (ELIQUIS) 5 MG TABS tablet Take 5 mg by mouth 2 (two) times daily.   atenolol (TENORMIN) 25 MG tablet Take 25 mg by mouth daily.    cephALEXin (KEFLEX) 250 MG capsule Take 250 mg by mouth daily.   Cholecalciferol (VITAMIN D3) 125 MCG (5000 UT) TABS Take 1 tablet by mouth daily.   FLUoxetine (PROZAC) 20 MG capsule Take 20 mg  by mouth at bedtime.   FLUoxetine (PROZAC) 40 MG capsule Take 40 mg by mouth in the morning.   furosemide (LASIX) 20 MG tablet Take 20 mg by mouth. Take Sundays and Thursday's AM   Per new patient form   levothyroxine (SYNTHROID) 112 MCG tablet Take 112 mcg by mouth daily before breakfast.   loratadine (CLARITIN) 10 MG tablet Take 10 mg by mouth daily.   losartan (COZAAR) 50 MG tablet Take 25 mg by mouth daily.   Omega-3 Fatty Acids (OMEGA-3 FISH OIL) 1200 MG CAPS Take 2 capsules by mouth daily.   Polyethyl Glycol-Propyl Glycol (SYSTANE) 0.4-0.3 % GEL ophthalmic gel Place 1 application into both eyes at bedtime.   rosuvastatin (CRESTOR) 20 MG tablet TAKE ONE TABLET EVERY EVENING   traMADol (ULTRAM) 50 MG tablet Take 1 tablet (50 mg total) by mouth daily.     Allergies:   Donepezil,  Ciprofloxacin, Latex, Levofloxacin, Macrobid [nitrofurantoin monohyd macro], Nitrofurantoin, Sulfa antibiotics, and Sulfamethoxazole   Past Medical History:  Diagnosis Date   Anxiety    Aortic regurgitation    Atrial fibrillation (HCC)    Chest pain    Per new patient form   Gastritis    GERD (gastroesophageal reflux disease)    H/O artificial eye lens    Hypertension    Hypothyroid    Left ventricular hypertrophy    Major depression    Osteoarthritis    Osteoporosis    Pacemaker    On Eliquis, Per new patient form   Peptic ulcer disease    Stress incontinence     Past Surgical History:  Procedure Laterality Date   ABDOMINAL SURGERY     Per new patient form   BLADDER SURGERY     DG  BONE DENSITY ([place] [facility])     Per new patient form   EYE SURGERY     KNEE ARTHROPLASTY     LEFT HEART CATH AND CORONARY ANGIOGRAPHY N/A 10/22/2019   Procedure: LEFT HEART CATH AND CORONARY ANGIOGRAPHY;  Surgeon: Sherren Mocha, MD;  Location: [facility];  Service: Cardiovascular;  Laterality: N/A;   LEG SURGERY Right    multiple surgeries, unknown type   PACEMAKER IMPLANT N/A 06/04/2018   Procedure:  PACEMAKER IMPLANT;  Surgeon: Deboraha Sprang, MD;  Location: [facility];  Service: Cardiovascular;  Laterality: N/A;     Social History:  The patient  reports that she has never smoked. She has never used smokeless tobacco. She reports that she does not drink alcohol and does not use drugs.   Family History:  The patient's family history includes Arthritis in her mother; Congestive Heart Failure in her mother; Heart attack in her father; Leukemia in her sister.    ROS:  Please see the history of present illness. All other systems are reviewed and  Negative to the above problem except as noted.    PHYSICAL EXAM: VS:  BP 122/78    Pulse 76    Ht 5' 8.5" (1.74 m)    Wt 200 lb (90.7 kg)    BMI 29.97 kg/m   GEN: Obese 88 yo in no acute distress  HEENT: normal  Neck: JVP normal   Cardiac: RRR; Gr II /vI sytolif murmur  NO LE edema Respiratory:  clear to auscultation bilaterally, GI: soft, nontender + BS  No hepatomegaly  MS: no deformity Moving all extremities   Skin: warm and dry, no rash Psych: euthymic mood, full affect   EKG:  EKG is not  ordered today.   Lipid Panel No results found for: CHOL, TRIG, HDL, CHOLHDL, VLDL, LDLCALC, LDLDIRECT    Wt Readings from Last 3 Encounters:  02/14/22 200 lb (90.7 kg)  02/07/22 199 lb 9.6 oz (90.5 kg)  02/02/22 199 lb 9.6 oz (90.5 kg)    CARDIAC STUDIES  Echo  01/13/22  Left ventricular ejection fraction, by estimation, is 60 to 65%. The left ventricle has normal function. The left ventricle has no regional wall motion abnormalities. There is mild left ventricular hypertrophy of the basal-septal segment. Left ventricular diastolic function could not be evaluated. 1. Right ventricular systolic function is normal. The right ventricular size is normal. There is normal pulmonary artery systolic pressure. The estimated right ventricular systolic pressure is 91.6 mmHg. 2. 3. The pericardial effusion is anterior to the right  ventricle. The mitral valve is normal in structure. No evidence of mitral  valve regurgitation. No evidence of mitral stenosis. 4. The aortic valve is calcified. There is severe calcifcation of the aortic valve. There is severe thickening of the aortic valve. Aortic valve regurgitation is mild. Mild aortic valve stenosis. Aortic valve area, by VTI measures 3.36 cm. Aortic valve mean gradient measures 11.0 mmHg. Aortic valve Vmax measures 2.20 m/s. 5. The inferior vena cava is normal in size with greater than 50% respiratory variability, suggesting right atrial pressure of 3 mmHg. 6. 7. No significant change from prior study. ECHO 10/17/19 1. Left ventricular ejection fraction, by visual estimation, is 55 to 60%. The left ventricle has normal function. There is mildly increased left ventricular hypertrophy. 2. Mild aortic stenosis is present. Vmax 2.3 cm/s, mean gradient 11 mmHG, AVA 1.86 cm2. No change from prior study (06/04/2018). 3. Small left ventricular internal cavity size. 4. The left ventricle has no regional wall motion abnormalities. 5. Global right ventricle has normal systolic function.The right ventricular size is normal. No increase in right ventricular wall thickness. 6. Left atrial size was mildly dilated. 7. Right atrial size was normal. 8. Presence of pericardial fat pad. 9. The mitral valve is degenerative. Mild mitral valve regurgitation. 10. The tricuspid valve is grossly normal. Tricuspid valve regurgitation is trivial. 11. The aortic valve has an indeterminant number of cusps. Aortic valve regurgitation is mild. Mild aortic valve stenosis. 12. There is Severe calcifcation of the aortic valve. 13. There is Severely thickening of the aortic valve. 14. The pulmonic valve was grossly normal. Pulmonic valve regurgitation is not visualized. 15. Mild plaque invoving the ascending aorta. 16. Normal pulmonary artery systolic pressure. 17. The tricuspid regurgitant  velocity is 2.34 m/s, and with an assumed right atrial pressure of 3 mmHg, the estimated right ventricular systolic pressure is normal at 24.9 mmHg. 18. A pacer wire is visualized in the RA and RV. 19. The inferior vena cava is normal in size with greater than 50% respiratory variability, suggesting right atrial pressure of 3 mmHg. In comparison to the previous echocardiogram(s): Prior examinations were reviewed in a side by side  MYOVUE:   10/19/19 There was no ST segment deviation noted during stress. Defect 1: There is a large defect of moderate severity present in the basal inferoseptal, mid anteroseptal, mid inferoseptal and apical septal location. Findings consistent with ischemia. This is an intermediate risk study. Nuclear stress EF: 61%. Abnormal TID ratio of 1.67   Large size, moderate severity inferoseptal, septal and apical anteroseptal perfusion defect c/w ischemia (SDS 5). Abnormal TID ratio of 1.67. LVEF 61% with normal wall motion. This is an intermediate risk study. Clinical correlation advised.   CARDIAC CATH: 10/22/19 Minimalirregularities ASSESSMENT AND PLAN: 1  Chst tightness   Pt denies any symptoms  Episode in past may reflect that patient was not taking her meds regularly   Follow   2  Fatigue   I do not think this is a sign of angina or CHF    She is deconditioned  Consider PT   2 PAF No palpitations  Keep on Eliquis  3  Hx tachy brady   Pt is s/p PPM     4  HTN  BP is well controlled    5  AV dz  Mild AS on echo    6  Thoracic Aortic aneurysm  Minimal dilation     F/U next fall/winter   Current medicines are reviewed at length with the patient today.  The patient does not have concerns regarding medicines.  Signed, Dorris Carnes, MD  02/14/2022 11:42 PM    [address], [place], [place]  [address] Phone: [phone]; Fax: [phone]

## 2022-02-14 ENCOUNTER — Other Ambulatory Visit: Payer: Self-pay

## 2022-02-14 ENCOUNTER — Ambulatory Visit (INDEPENDENT_AMBULATORY_CARE_PROVIDER_SITE_OTHER): Payer: Medicare Other | Admitting: Internal Medicine

## 2022-02-14 ENCOUNTER — Encounter: Payer: Self-pay | Admitting: Internal Medicine

## 2022-02-14 VITALS — BP 122/78 | HR 76 | Ht 68.5 in | Wt 200.0 lb

## 2022-02-14 DIAGNOSIS — Z79899 Other long term (current) drug therapy: Secondary | ICD-10-CM

## 2022-02-14 DIAGNOSIS — R0609 Other forms of dyspnea: Secondary | ICD-10-CM | POA: Diagnosis not present

## 2022-02-14 DIAGNOSIS — I10 Essential (primary) hypertension: Secondary | ICD-10-CM

## 2022-02-14 DIAGNOSIS — I480 Paroxysmal atrial fibrillation: Secondary | ICD-10-CM | POA: Diagnosis not present

## 2022-02-14 NOTE — Patient Instructions (Signed)
Medication Instructions:  ?Your physician recommends that you continue on your current medications as directed. Please refer to the Current Medication list given to you today. ? ?*If you need a refill on your cardiac medications before your next appointment, please call your pharmacy* ? ? ?Lab Work: ?BMET, CBC, TSH TODAY  ?If you have labs (blood work) drawn today and your tests are completely normal, you will receive your results only by: ?MyChart Message (if you have MyChart) OR ?A paper copy in the mail ?If you have any lab test that is abnormal or we need to change your treatment, we will call you to review the results. ? ? ?Testing/Procedures: ?NONE ? ? ?Follow-Up: ?At [facility], you and your health needs are our priority.  As part of our continuing mission to provide you with exceptional heart care, we have created designated Provider Care Teams.  These Care Teams include your primary Cardiologist (physician) and Advanced Practice Providers (APPs -  Physician Assistants and Nurse Practitioners) who all work together to provide you with the care you need, when you need it. ? ?We recommend signing up for the patient portal called "MyChart".  Sign up information is provided on this After Visit Summary.  MyChart is used to connect with patients for Virtual Visits (Telemedicine).  Patients are able to view lab/test results, encounter notes, upcoming appointments, etc.  Non-urgent messages can be sent to your provider as well.   ?To learn more about what you can do with MyChart, go to [email or web address].   ? ?Your next appointment:   ?1 year(s) ? ?The format for your next appointment:   ?In Person ? ?Provider:   ?Dorris Carnes, MD   ? ? ?Other Instructions ?  ?

## 2022-02-15 LAB — CBC
Hematocrit: 33.3 % — ABNORMAL LOW (ref 34.0–46.6)
Hemoglobin: 10.8 g/dL — ABNORMAL LOW (ref 11.1–15.9)
MCH: 28.7 pg (ref 26.6–33.0)
MCHC: 32.4 g/dL (ref 31.5–35.7)
MCV: 89 fL (ref 79–97)
Platelets: 233 10*3/uL (ref 150–450)
RBC: 3.76 x10E6/uL — ABNORMAL LOW (ref 3.77–5.28)
RDW: 13.1 % (ref 11.7–15.4)
WBC: 7.3 10*3/uL (ref 3.4–10.8)

## 2022-02-15 LAB — BASIC METABOLIC PANEL
BUN/Creatinine Ratio: 26 (ref 12–28)
BUN: 33 mg/dL — ABNORMAL HIGH (ref 8–27)
CO2: 21 mmol/L (ref 20–29)
Calcium: 9.6 mg/dL (ref 8.7–10.3)
Chloride: 102 mmol/L (ref 96–106)
Creatinine, Ser: 1.26 mg/dL — ABNORMAL HIGH (ref 0.57–1.00)
Glucose: 102 mg/dL — ABNORMAL HIGH (ref 70–99)
Potassium: 4.5 mmol/L (ref 3.5–5.2)
Sodium: 140 mmol/L (ref 134–144)
eGFR: 41 mL/min/{1.73_m2} — ABNORMAL LOW (ref >59)

## 2022-02-15 LAB — TSH: TSH: 0.178 u[IU]/mL — ABNORMAL LOW (ref 0.450–4.500)

## 2022-02-18 ENCOUNTER — Telehealth: Payer: Self-pay

## 2022-02-18 DIAGNOSIS — I480 Paroxysmal atrial fibrillation: Secondary | ICD-10-CM

## 2022-02-18 DIAGNOSIS — Z79899 Other long term (current) drug therapy: Secondary | ICD-10-CM

## 2022-02-18 DIAGNOSIS — E0590 Thyrotoxicosis, unspecified without thyrotoxic crisis or storm: Secondary | ICD-10-CM

## 2022-02-18 MED ORDER — LEVOTHYROXINE SODIUM 100 MCG PO TABS: 100.0000 ug | ORAL_TABLET | Freq: Every day | ORAL | 3 refills | Status: DC

## 2022-02-18 NOTE — Telephone Encounter (Signed)
-----   Message from Fay Records, MD sent at 02/17/2022 12:02 AM EDT ----- ?Electrolytes are OK   Kidney funciton is stable  ?CBC   Hgb is minimally decreased   ?Thyroid    May be getting a little too much thyroid    ?Would switch to 100 mcg daily    ?Check TSH in 6 wks   ?

## 2022-02-18 NOTE — Telephone Encounter (Signed)
Faith Parker with [name] advised.. new RX and TSH order faxed to Faith Parker [phone].  ?

## 2022-03-01 ENCOUNTER — Encounter: Payer: Self-pay | Admitting: Nurse Practitioner

## 2022-03-01 ENCOUNTER — Non-Acute Institutional Stay: Payer: Medicare Other | Admitting: Nurse Practitioner

## 2022-03-01 DIAGNOSIS — M159 Polyosteoarthritis, unspecified: Secondary | ICD-10-CM | POA: Diagnosis not present

## 2022-03-01 DIAGNOSIS — E039 Hypothyroidism, unspecified: Secondary | ICD-10-CM

## 2022-03-01 DIAGNOSIS — R350 Frequency of micturition: Secondary | ICD-10-CM | POA: Diagnosis not present

## 2022-03-01 DIAGNOSIS — E785 Hyperlipidemia, unspecified: Secondary | ICD-10-CM

## 2022-03-01 DIAGNOSIS — M150 Primary generalized (osteo)arthritis: Secondary | ICD-10-CM

## 2022-03-01 DIAGNOSIS — R609 Edema, unspecified: Secondary | ICD-10-CM

## 2022-03-01 DIAGNOSIS — I10 Essential (primary) hypertension: Secondary | ICD-10-CM

## 2022-03-01 DIAGNOSIS — R600 Localized edema: Secondary | ICD-10-CM

## 2022-03-01 DIAGNOSIS — Z959 Presence of cardiac and vascular implant and graft, unspecified: Secondary | ICD-10-CM

## 2022-03-01 DIAGNOSIS — N390 Urinary tract infection, site not specified: Secondary | ICD-10-CM | POA: Diagnosis not present

## 2022-03-01 DIAGNOSIS — F339 Major depressive disorder, recurrent, unspecified: Secondary | ICD-10-CM

## 2022-03-01 DIAGNOSIS — I480 Paroxysmal atrial fibrillation: Secondary | ICD-10-CM

## 2022-03-01 DIAGNOSIS — N1831 Chronic kidney disease, stage 3a: Secondary | ICD-10-CM

## 2022-03-01 NOTE — Assessment & Plan Note (Signed)
c/o urinary frequency, not new, but bathroom trips every 3-4 hours at night interfere her sleep at night, wants to try Myrbetriq. Observe for s/s of UTI ?

## 2022-03-01 NOTE — Assessment & Plan Note (Signed)
02/18/22 Dr. Dorris Carnes Levothyroxine 168mg qd, repeat TSH 6 wks.   TSH 0.178 02/14/22 ?

## 2022-03-01 NOTE — Assessment & Plan Note (Signed)
EKG 12/09/21 paced rhythm.  ?

## 2022-03-01 NOTE — Assessment & Plan Note (Signed)
Occasionally elevated Bp, denied headache, change of vision, chest pain/pressure, or SOB,   on Losartan, Atenolol, monitor Bp ?

## 2022-03-01 NOTE — Progress Notes (Signed)
?Location:   [facility] ?Nursing Home Room Number: 660 ?Place of Service:  ALF (13) ?Provider: Marlana Latus NP ? ?Nitara Szczerba X, NP ? ?Patient Care Team: ?Gemini Beaumier X, NP as PCP - General (Internal Medicine) ?Fay Records, MD as PCP - Cardiology (Cardiology) ? ?Extended Emergency Contact Information ?Primary Emergency Contact: May,Sharon ?Address: [address]  ?         [place], [place] [address] [place] ?Home Phone: [phone] ?Mobile Phone: [phone] ?Relation: Daughter ?Secondary Emergency Contact: Henderson,Miriam ?Address: [address] ?         [place], [place] [address] [place] ?Home Phone: [phone] ?Mobile Phone: [phone] ?Relation: Daughter ? ?Code Status: DNR ?Goals of care: Advanced Directive information ? ?  02/07/2022  ? 10:57 AM  ?Advanced Directives  ?Does Patient Have a Medical Advance Directive? Yes  ?Type of Paramedic of [place];Living will;Out of facility DNR (pink MOST or yellow form)  ?Does patient want to make changes to medical advance directive? No - Patient declined  ?Copy of [place] in Chart? No - copy requested  ? ? ? ?Chief Complaint  ?Patient presents with  ? Acute Visit  ?  Arthrotic pain.   ? ? ?HPI:  ?Pt is a 88 y.o. female seen today for an acute visit for arthrotic pain in general, prn Tramadol doesn't work as well as daily scheduled. Also c/o urinary frequency, not new, but bathroom trips every 3-4 hours at night interfere her sleep at night, wants to try Myrbetriq.  ? Afib, taking Atenolol, Eliquis, Hgb 10.8 02/14/22 ?            Pacemaker, EKG 12/09/21 paced rhythm.  ?            UTI suppression therapy Keflex '25mg'$  qd ?            Depression, on Fluoxetine ?            Edema BLE, taking Furosemide, BNP 577 12/08/21, 01/13/22 echo EF 60-65% ?            Hypothyroidism, 02/18/22 Dr. Dorris Carnes Levothyroxine 157mg qd, repeat TSH 6 wks.   TSH 0.178 02/14/22 ?            HTN, on Losartan, Atenolol ?             CKD Bun/cret 33/1.26 02/14/22 ?            Hyperlipidemia, on Rosuvastatin ?            OA, taking Tramadol ? ? ?Past Medical History:  ?Diagnosis Date  ? Anxiety   ? Aortic regurgitation   ? [place]   ? Chest pain   ? Per new patient form  ? Gastritis   ? GERD (gastroesophageal reflux disease)   ? H/O artificial eye lens   ? Hypertension   ? Hypothyroid   ? Left ventricular hypertrophy   ? Major depression   ? Osteoarthritis   ? Osteoporosis   ? Pacemaker   ? On Eliquis, Per new patient form  ? Peptic ulcer disease   ? Stress incontinence   ? ?Past Surgical History:  ?Procedure Laterality Date  ? ABDOMINAL SURGERY    ? Per new patient form  ? BLADDER SURGERY    ? [place] [facility] [place]    ? Per new patient form  ? EYE SURGERY    ? KNEE ARTHROPLASTY    ?  LEFT HEART CATH AND CORONARY ANGIOGRAPHY N/A 10/22/2019  ? Procedure: LEFT HEART CATH AND CORONARY ANGIOGRAPHY;  Surgeon: Sherren Mocha, MD;  Location: [facility];  Service: Cardiovascular;  Laterality: N/A;  ? LEG SURGERY Right   ? multiple surgeries, unknown type  ? PACEMAKER IMPLANT N/A 06/04/2018  ? Procedure: PACEMAKER IMPLANT;  Surgeon: Deboraha Sprang, MD;  Location: [facility];  Service: Cardiovascular;  Laterality: N/A;  ? ? ?Allergies  ?Allergen Reactions  ? Donepezil Nausea And Vomiting and Other (See Comments)  ?  Severe vomiting  ? Ciprofloxacin Other (See Comments)  ?  Reaction not recalled  ? Latex Other (See Comments)  ?  Reaction not recalled  ? Levofloxacin Other (See Comments)  ?  Reaction not recalled  ? Macrobid [employer] Macro] Other (See Comments)  ?  Reaction not recalled  ? Nitrofurantoin Other (See Comments)  ?  Reaction not recalled  ? Sulfa Antibiotics Other (See Comments)  ?  Reaction not recalled  ? Sulfamethoxazole   ?  Reaction not recalled  ? ? ?Allergies as of 03/01/2022   ? ?   Reactions  ? Donepezil Nausea And Vomiting, Other (See Comments)  ? Severe vomiting  ? Ciprofloxacin  Other (See Comments)  ? Reaction not recalled  ? Latex Other (See Comments)  ? Reaction not recalled  ? Levofloxacin Other (See Comments)  ? Reaction not recalled  ? Macrobid [nitrofurantoin Monohyd Macro] Other (See Comments)  ? Reaction not recalled  ? Nitrofurantoin Other (See Comments)  ? Reaction not recalled  ? Sulfa Antibiotics Other (See Comments)  ? Reaction not recalled  ? Sulfamethoxazole   ? Reaction not recalled  ? ?  ? ?  ?Medication List  ?  ? ?  ? Accurate as of March 01, 2022  3:53 PM. If you have any questions, ask your nurse or doctor.  ?  ?  ? ?  ? ?apixaban 5 MG Tabs tablet ?Commonly known as: ELIQUIS ?Take 5 mg by mouth 2 (two) times daily. ?  ?atenolol 25 MG tablet ?Commonly known as: TENORMIN ?Take 25 mg by mouth daily. ?  ?cephALEXin 250 MG capsule ?Commonly known as: KEFLEX ?Take 250 mg by mouth daily. ?  ?FLUoxetine 20 MG capsule ?Commonly known as: PROZAC ?Take 20 mg by mouth at bedtime. ?  ?FLUoxetine 40 MG capsule ?Commonly known as: PROZAC ?Take 40 mg by mouth in the morning. ?  ?furosemide 20 MG tablet ?Commonly known as: LASIX ?Take 20 mg by mouth. Take Sundays and Thursday's AM  ? ?Per new patient form ?  ?levothyroxine 100 MCG tablet ?Commonly known as: Synthroid ?Take 1 tablet (100 mcg total) by mouth daily before breakfast. ?  ?loratadine 10 MG tablet ?Commonly known as: CLARITIN ?Take 10 mg by mouth daily. ?  ?losartan 50 MG tablet ?Commonly known as: COZAAR ?Take 25 mg by mouth daily. ?  ?Omega-3 Fish Oil 1200 MG Caps ?Take 2 capsules by mouth daily. ?  ?rosuvastatin 20 MG tablet ?Commonly known as: CRESTOR ?TAKE ONE TABLET EVERY EVENING ?  ?Systane 0.4-0.3 % Gel ophthalmic gel ?Generic drug: Polyethyl Glycol-Propyl Glycol ?Place 1 application into both eyes at bedtime. ?  ?traMADol 50 MG tablet ?Commonly known as: ULTRAM ?Take 1 tablet (50 mg total) by mouth daily. ?  ?Vitamin D3 125 MCG (5000 UT) Tabs ?Take 1 tablet by mouth daily. ?  ? ?  ? ? ?Review of Systems   ?Constitutional:  Negative for appetite change,  fatigue and fever.  ?HENT:  Negative for congestion and trouble swallowing.   ?Eyes:  Positive for visual disturbance.  ?     Left eye blind, failed multiple cornea transplants   ?Respiratory:  Negative for cough, shortness of breath and wheezing.   ?Cardiovascular:  Positive for leg swelling. Negative for chest pain and palpitations.  ?Gastrointestinal:  Negative for abdominal pain and constipation.  ?Genitourinary:  Positive for frequency. Negative for dysuria and urgency.  ?Musculoskeletal:  Positive for arthralgias. Negative for gait problem.  ?Skin:  Negative for color change.  ?Neurological:  Negative for dizziness, speech difficulty and headaches.  ?Psychiatric/Behavioral:  Negative for behavioral problems, dysphoric mood and sleep disturbance. The patient is not nervous/anxious.   ? ?Immunization History  ?Administered Date(s) Administered  ? Influenza Split 09/13/2017, 09/06/2018, 09/18/2019  ? Influenza, High Dose Seasonal PF 09/04/2021  ? Moderna SARS-COV2 Booster Vaccination 10/13/2020  ? PFIZER(Purple Top)SARS-COV-2 Vaccination 12/16/2019, 01/16/2020  ? PNEUMOCOCCAL CONJUGATE-20 12/23/2021  ? ?Pertinent  Health Maintenance Due  ?Topic Date Due  ? INFLUENZA VACCINE  Completed  ? DEXA SCAN  Completed  ? ? ?  10/20/2019  ?  9:20 PM 10/21/2019  ?  7:49 AM 10/21/2019  ?  9:20 PM 10/22/2019  ?  9:00 AM 12/08/2021  ? 11:10 AM  ?Fall Risk  ?Patient Fall Risk Level Moderate fall risk Moderate fall risk Moderate fall risk Moderate fall risk Low fall risk  ? ?Functional Status Survey: ?  ? ?Vitals:  ? 03/01/22 1236  ?BP: (!) 144/94  ?Pulse: 70  ?Resp: 18  ?Temp: 97.6 ?F (36.4 ?C)  ?SpO2: 96%  ? ?There is no height or weight on file to calculate BMI. ?Physical Exam ?Vitals and nursing note reviewed.  ?Constitutional:   ?   Appearance: Normal appearance.  ?HENT:  ?   Head: Normocephalic and atraumatic.  ?   Nose: Nose normal.  ?   Mouth/Throat:  ?   Mouth: Mucous  membranes are moist.  ?Eyes:  ?   Extraocular Movements: Extraocular movements intact.  ?   Conjunctiva/sclera: Conjunctivae normal.  ?   Pupils: Pupils are equal, round, and reactive to light.  ?Cardiovascular:  ?   Rate a

## 2022-03-01 NOTE — Assessment & Plan Note (Signed)
Heart rate is in control, taking Atenolol, Eliquis, Hgb 10.8 02/14/22 ?

## 2022-03-01 NOTE — Assessment & Plan Note (Signed)
UTI suppression therapy Keflex '25mg'$  qd, denied dysuria, urinary urgency, or abd pain.  ?

## 2022-03-01 NOTE — Assessment & Plan Note (Signed)
Her mood is stable, on Fluoxetine ?

## 2022-03-01 NOTE — Assessment & Plan Note (Signed)
on Rosuvastatin 

## 2022-03-01 NOTE — Assessment & Plan Note (Signed)
Will resume Tramadol '50mg'$  qam.  ?

## 2022-03-01 NOTE — Assessment & Plan Note (Signed)
taking Furosemide, BNP 577 12/08/21, 01/13/22 echo EF 60-65% ?

## 2022-03-01 NOTE — Assessment & Plan Note (Signed)
Bun/cret 33/1.26 02/14/22 ?

## 2022-03-03 ENCOUNTER — Non-Acute Institutional Stay: Payer: Medicare Other | Admitting: Nurse Practitioner

## 2022-03-03 ENCOUNTER — Encounter: Payer: Self-pay | Admitting: Nurse Practitioner

## 2022-03-03 DIAGNOSIS — E785 Hyperlipidemia, unspecified: Secondary | ICD-10-CM

## 2022-03-03 DIAGNOSIS — Z959 Presence of cardiac and vascular implant and graft, unspecified: Secondary | ICD-10-CM

## 2022-03-03 DIAGNOSIS — M150 Primary generalized (osteo)arthritis: Secondary | ICD-10-CM

## 2022-03-03 DIAGNOSIS — E039 Hypothyroidism, unspecified: Secondary | ICD-10-CM

## 2022-03-03 DIAGNOSIS — R609 Edema, unspecified: Secondary | ICD-10-CM

## 2022-03-03 DIAGNOSIS — I10 Essential (primary) hypertension: Secondary | ICD-10-CM

## 2022-03-03 DIAGNOSIS — M159 Polyosteoarthritis, unspecified: Secondary | ICD-10-CM

## 2022-03-03 DIAGNOSIS — N1831 Chronic kidney disease, stage 3a: Secondary | ICD-10-CM

## 2022-03-03 DIAGNOSIS — R600 Localized edema: Secondary | ICD-10-CM

## 2022-03-03 DIAGNOSIS — F339 Major depressive disorder, recurrent, unspecified: Secondary | ICD-10-CM

## 2022-03-03 DIAGNOSIS — I480 Paroxysmal atrial fibrillation: Secondary | ICD-10-CM

## 2022-03-03 DIAGNOSIS — N390 Urinary tract infection, site not specified: Secondary | ICD-10-CM | POA: Diagnosis not present

## 2022-03-03 NOTE — Progress Notes (Signed)
?Location:  [facility] ?Nursing Home Room Number: 904-A ?Place of Service:  ALF (13) ?Provider:  Marki Frede X, NP ? ? ?Senaida Chilcote X, NP ? ?Patient Care Team: ?Joshual Terrio X, NP as PCP - General (Internal Medicine) ?Fay Records, MD as PCP - Cardiology (Cardiology) ? ?Extended Emergency Contact Information ?Primary Emergency Contact: May,Sharon ?Address: [place]  ?         [place], [place] [address] [place] ?Home Phone: [phone] ?Mobile Phone: [phone] ?Relation: Daughter ?Secondary Emergency Contact: Henderson,Miriam ?Address: [address] ?         [place], [place] [address] [place] ?Home Phone: [phone] ?Mobile Phone: [phone] ?Relation: Daughter ? ?Code Status:  DNR ?Goals of care: Advanced Directive information ? ?  03/03/2022  ?  5:00 PM  ?Advanced Directives  ?Does Patient Have a Medical Advance Directive? Yes  ?Type of Paramedic of [place];Living will;Out of facility DNR (pink MOST or yellow form)  ?Does patient want to make changes to medical advance directive? No - Patient declined  ?Copy of [place] in Chart? Yes - validated most recent copy scanned in chart (See row information)  ?Pre-existing out of facility DNR order (yellow form or pink MOST form) Yellow form placed in chart (order not valid for inpatient use)  ? ? ? ?Chief Complaint  ?Patient presents with  ? Acute Visit  ?  Swelling BLE  ? ? ?HPI:  ?Pt is a 88 y.o. female seen today for an acute visit for reported increased swelling and discomfort in BLE/feet yesterday. The patient stated she feels her legs/feet is at her baseline.  ?  Afib, taking Atenolol, Eliquis, Hgb 10.8 02/14/22 ?            Pacemaker, EKG 12/09/21 paced rhythm.  ?            UTI suppression therapy Keflex '25mg'$  qd ?            Depression, on Fluoxetine ?            Edema BLE, taking Furosemide, BNP 577 12/08/21, 01/13/22 echo EF 60-65% ?            Hypothyroidism, 02/18/22 Dr. Dorris Carnes Levothyroxine 111mg qd, repeat TSH 6 wks.   TSH 0.178 02/14/22 ?            HTN, on Losartan, Atenolol ?            CKD Bun/cret 33/1.26 02/14/22 ?            Hyperlipidemia, on Rosuvastatin ?            OA, taking Tramadol qd helped.  ?  ? ?Past Medical History:  ?Diagnosis Date  ? Anxiety   ? Aortic regurgitation   ? Atrial fibrillation ([place]   ? Chest pain   ? Per new patient form  ? Gastritis   ? GERD (gastroesophageal reflux disease)   ? H/O artificial eye lens   ? Hypertension   ? Hypothyroid   ? Left ventricular hypertrophy   ? Major depression   ? Osteoarthritis   ? Osteoporosis   ? Pacemaker   ? On Eliquis, Per new patient form  ? Peptic ulcer disease   ? Stress incontinence   ? ?Past Surgical History:  ?Procedure Laterality Date  ? ABDOMINAL SURGERY    ? Per new patient form  ? BLADDER SURGERY    ? DG  BONE DENSITY ([place] [facility])    ? Per new patient form  ? EYE SURGERY    ? KNEE ARTHROPLASTY    ? LEFT HEART CATH AND CORONARY ANGIOGRAPHY N/A 10/22/2019  ? Procedure: LEFT HEART CATH AND CORONARY ANGIOGRAPHY;  Surgeon: Sherren Mocha, MD;  Location: [facility];  Service: Cardiovascular;  Laterality: N/A;  ? LEG SURGERY Right   ? multiple surgeries, unknown type  ? PACEMAKER IMPLANT N/A 06/04/2018  ? Procedure: PACEMAKER IMPLANT;  Surgeon: Deboraha Sprang, MD;  Location: [facility];  Service: Cardiovascular;  Laterality: N/A;  ? ? ?Allergies  ?Allergen Reactions  ? Donepezil Nausea And Vomiting and Other (See Comments)  ?  Severe vomiting  ? Ciprofloxacin Other (See Comments)  ?  Reaction not recalled  ? Latex Other (See Comments)  ?  Reaction not recalled  ? Levofloxacin Other (See Comments)  ?  Reaction not recalled  ? Macrobid [employer] Macro] Other (See Comments)  ?  Reaction not recalled  ? Nitrofurantoin Other (See Comments)  ?  Reaction not recalled  ? Sulfa Antibiotics Other (See Comments)  ?  Reaction not recalled  ? Sulfamethoxazole   ?  Reaction not recalled   ? ? ?Outpatient Encounter Medications as of 03/03/2022  ?Medication Sig  ? apixaban (ELIQUIS) 5 MG TABS tablet Take 5 mg by mouth 2 (two) times daily.  ? atenolol (TENORMIN) 25 MG tablet Take 25 mg by mouth daily.   ? cephALEXin (KEFLEX) 250 MG capsule Take 250 mg by mouth daily.  ? Cholecalciferol (VITAMIN D3) 125 MCG (5000 UT) TABS Take 1 tablet by mouth daily.  ? FLUoxetine (PROZAC) 20 MG capsule Take 20 mg by mouth at bedtime.  ? FLUoxetine (PROZAC) 40 MG capsule Take 40 mg by mouth in the morning.  ? furosemide (LASIX) 20 MG tablet Take 20 mg by mouth. Take Sundays and Thursday's AM  ? ?Per new patient form  ? levothyroxine (SYNTHROID) 100 MCG tablet Take 1 tablet (100 mcg total) by mouth daily before breakfast.  ? loratadine (CLARITIN) 10 MG tablet Take 10 mg by mouth daily.  ? losartan (COZAAR) 50 MG tablet Take 25 mg by mouth daily.  ? Omega-3 Fatty Acids (OMEGA-3 FISH OIL) 1200 MG CAPS Take 2 capsules by mouth daily.  ? Polyethyl Glycol-Propyl Glycol (SYSTANE) 0.4-0.3 % GEL ophthalmic gel Place 1 application into both eyes at bedtime.  ? rosuvastatin (CRESTOR) 20 MG tablet TAKE ONE TABLET EVERY EVENING  ? traMADol (ULTRAM) 50 MG tablet Take 1 tablet (50 mg total) by mouth daily.  ? ?No facility-administered encounter medications on file as of 03/03/2022.  ? ? ?Review of Systems  ?Constitutional:  Negative for appetite change, fatigue and fever.  ?HENT:  Negative for congestion and trouble swallowing.   ?Eyes:  Positive for visual disturbance.  ?     Left eye blind, failed multiple cornea transplants   ?Respiratory:  Negative for cough, shortness of breath and wheezing.   ?Cardiovascular:  Positive for leg swelling. Negative for chest pain and palpitations.  ?Gastrointestinal:  Negative for abdominal pain and constipation.  ?Genitourinary:  Positive for frequency. Negative for dysuria and urgency.  ?Musculoskeletal:  Positive for arthralgias. Negative for gait problem.  ?Skin:  Negative for color change.   ?Neurological:  Negative for dizziness, speech difficulty and headaches.  ?Psychiatric/Behavioral:  Negative for behavioral problems, dysphoric mood and sleep disturbance. The patient is not nervous/anxious.   ? ?Immunization History  ?Administered Date(s) Administered  ?  Influenza Split 09/13/2017, 09/06/2018, 09/18/2019  ? Influenza, High Dose Seasonal PF 09/04/2021  ? Moderna SARS-COV2 Booster Vaccination 10/13/2020  ? PFIZER(Purple Top)SARS-COV-2 Vaccination 12/16/2019, 01/16/2020  ? PNEUMOCOCCAL CONJUGATE-20 12/23/2021  ? ?Pertinent  Health Maintenance Due  ?Topic Date Due  ? INFLUENZA VACCINE  Completed  ? DEXA SCAN  Completed  ? ? ?  10/20/2019  ?  9:20 PM 10/21/2019  ?  7:49 AM 10/21/2019  ?  9:20 PM 10/22/2019  ?  9:00 AM 12/08/2021  ? 11:10 AM  ?Fall Risk  ?Patient Fall Risk Level Moderate fall risk Moderate fall risk Moderate fall risk Moderate fall risk Low fall risk  ? ?Functional Status Survey: ?  ? ?Vitals:  ? 03/03/22 1657  ?BP: (!) 146/94  ?Pulse: 69  ?Resp: 18  ?Temp: 98.3 ?F (36.8 ?C)  ?SpO2: 95%  ?Weight: 199 lb 9.6 oz (90.5 kg)  ?Height: 5' 8.5" (1.74 m)  ? ?Body mass index is 29.91 kg/m?Marland Kitchen ?Physical Exam ?Vitals and nursing note reviewed.  ?Constitutional:   ?   Appearance: Normal appearance.  ?HENT:  ?   Head: Normocephalic and atraumatic.  ?   Nose: Nose normal.  ?   Mouth/Throat:  ?   Mouth: Mucous membranes are moist.  ?Eyes:  ?   Extraocular Movements: Extraocular movements intact.  ?   Conjunctiva/sclera: Conjunctivae normal.  ?   Pupils: Pupils are equal, round, and reactive to light.  ?Cardiovascular:  ?   Rate and Rhythm: Normal rate and regular rhythm.  ?   Heart sounds: Murmur heard.  ?   Comments: pacemaker ?Pulmonary:  ?   Effort: Pulmonary effort is normal.  ?Abdominal:  ?   General: Bowel sounds are normal. There is no distension.  ?   Palpations: Abdomen is soft.  ?   Tenderness: There is no abdominal tenderness. There is no right CVA tenderness, left CVA tenderness, guarding or  rebound.  ?Musculoskeletal:  ?   Cervical back: Normal range of motion and neck supple.  ?   Right lower leg: Edema present.  ?   Left lower leg: Edema present.  ?   Comments: Trace edema BLE  ?Skin: ?   General: [place]

## 2022-03-04 ENCOUNTER — Encounter: Payer: Self-pay | Admitting: Nurse Practitioner

## 2022-03-04 NOTE — Assessment & Plan Note (Signed)
EKG 12/09/21 paced rhythm.  ?

## 2022-03-04 NOTE — Assessment & Plan Note (Signed)
Her mood is stable, on Fluoxetine ?

## 2022-03-04 NOTE — Assessment & Plan Note (Signed)
on Rosuvastatin 

## 2022-03-04 NOTE — Assessment & Plan Note (Signed)
taking Tramadol qd helped.  ?  ?

## 2022-03-04 NOTE — Assessment & Plan Note (Signed)
Heart rate is in control, taking Atenolol, Eliquis, Hgb 10.8 02/14/22 ?

## 2022-03-04 NOTE — Assessment & Plan Note (Signed)
Edema BLE, taking Furosemide, BNP 577 12/08/21, 01/13/22 echo EF 60-65%,  reported increased swelling and discomfort in BLE/feet yesterday. The patient stated she feels her legs/feet is at her baseline.  ?Will weigh the patient weekly to monitor if weight gains >3-5Ibs/wk or s/s of developing CHF ?

## 2022-03-04 NOTE — Assessment & Plan Note (Signed)
Blood pressure is controlled, on Losartan, Atenolol 

## 2022-03-04 NOTE — Assessment & Plan Note (Signed)
UTI suppression therapy Keflex '25mg'$  qd ?

## 2022-03-04 NOTE — Assessment & Plan Note (Signed)
02/18/22 Dr. Dorris Carnes Levothyroxine 142mg qd, repeat TSH 6 wks.   TSH 0.178 02/14/22 ?

## 2022-03-04 NOTE — Assessment & Plan Note (Signed)
Bun/cret 33/1.26 02/14/22 ?

## 2022-03-08 ENCOUNTER — Ambulatory Visit (INDEPENDENT_AMBULATORY_CARE_PROVIDER_SITE_OTHER): Payer: Medicare Other

## 2022-03-08 DIAGNOSIS — I480 Paroxysmal atrial fibrillation: Secondary | ICD-10-CM

## 2022-03-09 LAB — CUP PACEART REMOTE DEVICE CHECK
Battery Remaining Longevity: 124 mo
Battery Voltage: 3.01 V
Brady Statistic AP VP Percent: 0.1 %
Brady Statistic AP VS Percent: 83.86 %
Brady Statistic AS VP Percent: 0.02 %
Brady Statistic AS VS Percent: 16.04 %
Brady Statistic RA Percent Paced: 78.35 %
Brady Statistic RV Percent Paced: 0.68 %
Date Time Interrogation Session: 20230404015309
Implantable Lead Implant Date: 20190701
Implantable Lead Implant Date: 20190701
Implantable Lead Location: 753859
Implantable Lead Location: 753860
Implantable Lead Model: 5076
Implantable Lead Model: 5076
Implantable Pulse Generator Implant Date: 20190701
Lead Channel Impedance Value: 304 Ohm
Lead Channel Impedance Value: 323 Ohm
Lead Channel Impedance Value: 380 Ohm
Lead Channel Impedance Value: 418 Ohm
Lead Channel Pacing Threshold Amplitude: 0.625 V
Lead Channel Pacing Threshold Amplitude: 1.125 V
Lead Channel Pacing Threshold Pulse Width: 0.4 ms
Lead Channel Pacing Threshold Pulse Width: 0.4 ms
Lead Channel Sensing Intrinsic Amplitude: 1.125 mV
Lead Channel Sensing Intrinsic Amplitude: 1.125 mV
Lead Channel Sensing Intrinsic Amplitude: 11.5 mV
Lead Channel Sensing Intrinsic Amplitude: 11.5 mV
Lead Channel Setting Pacing Amplitude: 1.5 V
Lead Channel Setting Pacing Amplitude: 2.5 V
Lead Channel Setting Pacing Pulse Width: 0.4 ms
Lead Channel Setting Sensing Sensitivity: 2 mV

## 2022-03-24 NOTE — Progress Notes (Signed)
Remote pacemaker transmission.   

## 2022-03-31 LAB — TSH: TSH: 0.23 — AB (ref 0.41–5.90)

## 2022-04-01 ENCOUNTER — Non-Acute Institutional Stay: Payer: Medicare Other | Admitting: Nurse Practitioner

## 2022-04-01 ENCOUNTER — Encounter: Payer: Self-pay | Admitting: Nurse Practitioner

## 2022-04-01 DIAGNOSIS — M159 Polyosteoarthritis, unspecified: Secondary | ICD-10-CM

## 2022-04-01 DIAGNOSIS — F339 Major depressive disorder, recurrent, unspecified: Secondary | ICD-10-CM | POA: Diagnosis not present

## 2022-04-01 DIAGNOSIS — R600 Localized edema: Secondary | ICD-10-CM

## 2022-04-01 DIAGNOSIS — M150 Primary generalized (osteo)arthritis: Secondary | ICD-10-CM

## 2022-04-01 DIAGNOSIS — N1831 Chronic kidney disease, stage 3a: Secondary | ICD-10-CM

## 2022-04-01 DIAGNOSIS — E039 Hypothyroidism, unspecified: Secondary | ICD-10-CM | POA: Diagnosis not present

## 2022-04-01 DIAGNOSIS — I10 Essential (primary) hypertension: Secondary | ICD-10-CM

## 2022-04-01 DIAGNOSIS — I480 Paroxysmal atrial fibrillation: Secondary | ICD-10-CM

## 2022-04-01 DIAGNOSIS — N390 Urinary tract infection, site not specified: Secondary | ICD-10-CM

## 2022-04-01 DIAGNOSIS — E785 Hyperlipidemia, unspecified: Secondary | ICD-10-CM

## 2022-04-01 DIAGNOSIS — Z959 Presence of cardiac and vascular implant and graft, unspecified: Secondary | ICD-10-CM

## 2022-04-01 DIAGNOSIS — R609 Edema, unspecified: Secondary | ICD-10-CM

## 2022-04-01 NOTE — Assessment & Plan Note (Signed)
Heart rate is in control, aking Atenolol, Eliquis, Hgb 10.8 02/14/22 ?

## 2022-04-01 NOTE — Progress Notes (Signed)
?Location:  [facility] ?Nursing Home Room Number: 904/A ?Place of Service:  ALF (13) ?Provider:  Puja Caffey X, NP ? ? ?Dahmir Epperly X, NP ? ?Patient Care Team: ?Tobi Groesbeck X, NP as PCP - General (Internal Medicine) ?Fay Records, MD as PCP - Cardiology (Cardiology) ? ?Extended Emergency Contact Information ?Primary Emergency Contact: May,Sharon ?Address: [address] [place]  ?         [place], [place] [address] [place] ?Home Phone: [phone] ?Mobile Phone: [phone] ?Relation: Daughter ?Secondary Emergency Contact: Henderson,Miriam ?Address: [address] ?         [place], [place] [address] [place] ?Home Phone: [phone] ?Mobile Phone: [phone] ?Relation: Daughter ? ?Code Status:  DNR ?Goals of care: Advanced Directive information ? ?  04/01/2022  ? 11:10 AM  ?Advanced Directives  ?Does Patient Have a Medical Advance Directive? Yes  ?Type of Paramedic of [place];Living will;Out of facility DNR (pink MOST or yellow form)  ?Does patient want to make changes to medical advance directive? No - Patient declined  ?Pre-existing out of facility DNR order (yellow form or pink MOST form) Yellow form placed in chart (order not valid for inpatient use)  ? ? ? ?Chief Complaint  ?Patient presents with  ? Medical Management of Chronic Issues  ?  Patient is here for a follow up for chronic conditions ?  ? ? ?HPI:  ?Pt is a 88 y.o. female seen today for managing chronic medical conditions.  ?  ? Afib, taking Atenolol, Eliquis, Hgb 10.8 02/14/22 ?            Pacemaker, EKG 12/09/21 paced rhythm.  ?            UTI suppression therapy Keflex '25mg'$  qd ?            Depression, on Fluoxetine ?            Edema BLE, taking Furosemide, BNP 577 12/08/21, 01/13/22 echo EF 60-65% ?            Hypothyroidism, 02/18/22 Dr. Dorris Carnes Levothyroxine 179mg qd, TSH 0.178 02/14/22<< 0.23 03/31/22 ?            HTN, on Losartan, Atenolol ?            CKD Bun/cret 33/1.26 02/14/22 ?             Hyperlipidemia, on Rosuvastatin ?            OA, taking Tramadol qd helped.  ? ?Past Medical History:  ?Diagnosis Date  ? Anxiety   ? Aortic regurgitation   ? Atrial fibrillation ([place]   ? Chest pain   ? Per new patient form  ? Gastritis   ? GERD (gastroesophageal reflux disease)   ? H/O artificial eye lens   ? Hypertension   ? Hypothyroid   ? Left ventricular hypertrophy   ? Major depression   ? Osteoarthritis   ? Osteoporosis   ? Pacemaker   ? On Eliquis, Per new patient form  ? Peptic ulcer disease   ? Stress incontinence   ? ?Past Surgical History:  ?Procedure Laterality Date  ? ABDOMINAL SURGERY    ? Per new patient form  ? BLADDER SURGERY    ? DG  BONE DENSITY ([facility])    ? Per new patient form  ? EYE SURGERY    ? KNEE ARTHROPLASTY    ? LEFT HEART CATH AND CORONARY ANGIOGRAPHY N/A 10/22/2019  ?  Procedure: LEFT HEART CATH AND CORONARY ANGIOGRAPHY;  Surgeon: Sherren Mocha, MD;  Location: [facility];  Service: Cardiovascular;  Laterality: N/A;  ? LEG SURGERY Right   ? multiple surgeries, unknown type  ? PACEMAKER IMPLANT N/A 06/04/2018  ? Procedure: PACEMAKER IMPLANT;  Surgeon: Deboraha Sprang, MD;  Location: [facility];  Service: Cardiovascular;  Laterality: N/A;  ? ? ?Allergies  ?Allergen Reactions  ? Donepezil Nausea And Vomiting and Other (See Comments)  ?  Severe vomiting  ? Ciprofloxacin Other (See Comments)  ?  Reaction not recalled  ? Latex Other (See Comments)  ?  Reaction not recalled  ? Levofloxacin Other (See Comments)  ?  Reaction not recalled  ? Macrobid [employer] Macro] Other (See Comments)  ?  Reaction not recalled  ? Nitrofurantoin Other (See Comments)  ?  Reaction not recalled  ? Sulfa Antibiotics Other (See Comments)  ?  Reaction not recalled  ? Sulfamethoxazole   ?  Reaction not recalled  ? ? ?Outpatient Encounter Medications as of 04/01/2022  ?Medication Sig  ? apixaban (ELIQUIS) 5 MG TABS tablet Take 5 mg by mouth 2 (two) times daily.  ? atenolol (TENORMIN) 25  MG tablet Take 25 mg by mouth daily.   ? cephALEXin (KEFLEX) 250 MG capsule Take 250 mg by mouth daily.  ? Cholecalciferol (VITAMIN D3) 125 MCG (5000 UT) TABS Take 1 tablet by mouth daily.  ? FLUoxetine (PROZAC) 20 MG capsule Take 20 mg by mouth at bedtime.  ? FLUoxetine (PROZAC) 40 MG capsule Take 40 mg by mouth in the morning.  ? furosemide (LASIX) 20 MG tablet Take 20 mg by mouth. Take Sundays and Thursday's AM  ? ?Per new patient form  ? levothyroxine (SYNTHROID) 100 MCG tablet Take 1 tablet (100 mcg total) by mouth daily before breakfast.  ? loratadine (CLARITIN) 10 MG tablet Take 10 mg by mouth daily.  ? losartan (COZAAR) 50 MG tablet Take 25 mg by mouth daily.  ? Omega-3 Fatty Acids (OMEGA-3 FISH OIL) 1200 MG CAPS Take 2 capsules by mouth daily.  ? Polyethyl Glycol-Propyl Glycol (SYSTANE) 0.4-0.3 % GEL ophthalmic gel Place 1 application into both eyes at bedtime.  ? rosuvastatin (CRESTOR) 20 MG tablet TAKE ONE TABLET EVERY EVENING  ? traMADol (ULTRAM) 50 MG tablet Take 1 tablet (50 mg total) by mouth daily.  ? ?No facility-administered encounter medications on file as of 04/01/2022.  ? ? ?Review of Systems  ?Constitutional:  Negative for appetite change, fatigue and fever.  ?HENT:  Negative for congestion and trouble swallowing.   ?Eyes:  Positive for visual disturbance.  ?     Left eye blind, failed multiple cornea transplants   ?Respiratory:  Negative for cough, shortness of breath and wheezing.   ?Cardiovascular:  Positive for leg swelling. Negative for chest pain and palpitations.  ?Gastrointestinal:  Negative for abdominal pain and constipation.  ?Genitourinary:  Positive for frequency. Negative for dysuria and urgency.  ?Musculoskeletal:  Positive for arthralgias. Negative for gait problem.  ?Skin:  Negative for color change.  ?Neurological:  Negative for dizziness, speech difficulty and headaches.  ?Psychiatric/Behavioral:  Negative for behavioral problems, dysphoric mood and sleep disturbance. The  patient is not nervous/anxious.   ? ?Immunization History  ?Administered Date(s) Administered  ? Influenza Split 09/13/2017, 09/06/2018, 09/18/2019  ? Influenza, High Dose Seasonal PF 09/04/2021  ? Moderna SARS-COV2 Booster Vaccination 10/13/2020  ? PFIZER(Purple Top)SARS-COV-2 Vaccination 12/16/2019, 01/16/2020  ? PNEUMOCOCCAL CONJUGATE-20 12/23/2021  ? ?Pertinent  Health Maintenance Due  ?Topic Date Due  ? INFLUENZA VACCINE  07/05/2022  ? DEXA SCAN  Completed  ? ? ?  10/20/2019  ?  9:20 PM 10/21/2019  ?  7:49 AM 10/21/2019  ?  9:20 PM 10/22/2019  ?  9:00 AM 12/08/2021  ? 11:10 AM  ?Fall Risk  ?Patient Fall Risk Level Moderate fall risk Moderate fall risk Moderate fall risk Moderate fall risk Low fall risk  ? ?Functional Status Survey: ?  ? ?Vitals:  ? 04/01/22 1111  ?BP: 105/70  ?Pulse: 69  ?Resp: 19  ?Temp: 98.3 ?F (36.8 ?C)  ?Weight: 199 lb (90.3 kg)  ? ?Body mass index is 29.82 kg/m?Marland Kitchen ?Physical Exam ?Vitals and nursing note reviewed.  ?Constitutional:   ?   Appearance: Normal appearance.  ?HENT:  ?   Head: Normocephalic and atraumatic.  ?   Nose: Nose normal.  ?   Mouth/Throat:  ?   Mouth: Mucous membranes are moist.  ?Eyes:  ?   Extraocular Movements: Extraocular movements intact.  ?   Conjunctiva/sclera: Conjunctivae normal.  ?   Pupils: Pupils are equal, round, and reactive to light.  ?Cardiovascular:  ?   Rate and Rhythm: Normal rate and regular rhythm.  ?   Heart sounds: Murmur heard.  ?   Comments: pacemaker ?Pulmonary:  ?   Effort: Pulmonary effort is normal.  ?Abdominal:  ?   General: Bowel sounds are normal. There is no distension.  ?   Palpations: Abdomen is soft.  ?   Tenderness: There is no abdominal tenderness. There is no right CVA tenderness, left CVA tenderness, guarding or rebound.  ?Musculoskeletal:  ?   Cervical back: Normal range of motion and neck supple.  ?   Right lower leg: Edema present.  ?   Left lower leg: Edema present.  ?   Comments: Trace edema BLE  ?Skin: ?   General: Skin is warm  and dry.  ?Neurological:  ?   General: No focal deficit present.  ?   Mental Status: She is alert and oriented to person, place, and time. Mental status is at baseline.  ?   Motor: No weakness.  ?   Gait: Gaynelle Arabian

## 2022-04-01 NOTE — Assessment & Plan Note (Signed)
EKG 12/09/21 paced rhythm.  ?

## 2022-04-01 NOTE — Assessment & Plan Note (Signed)
Trace edema BLE, taking Furosemide, BNP 577 12/08/21, 01/13/22 echo EF 60-65% ?

## 2022-04-01 NOTE — Assessment & Plan Note (Signed)
suppression therapy Keflex '25mg'$  qd ?

## 2022-04-01 NOTE — Assessment & Plan Note (Signed)
Bun/cret 33/1.26 02/14/22 ?

## 2022-04-01 NOTE — Assessment & Plan Note (Signed)
Her mood is stable, on Fluoxetine ?

## 2022-04-01 NOTE — Assessment & Plan Note (Signed)
taking Tramadol qd helped.  ?

## 2022-04-01 NOTE — Assessment & Plan Note (Signed)
03/31/22 TSH 0.23, decrease Levothyroxine 49mg qd, TSH 6 weeks.  ?

## 2022-04-01 NOTE — Assessment & Plan Note (Signed)
on Rosuvastatin 

## 2022-04-01 NOTE — Assessment & Plan Note (Signed)
Blood pressure is controlled, on Losartan, Atenolol 

## 2022-04-04 ENCOUNTER — Encounter: Payer: Self-pay | Admitting: Adult Health

## 2022-04-04 ENCOUNTER — Non-Acute Institutional Stay: Payer: Medicare Other | Admitting: Adult Health

## 2022-04-04 DIAGNOSIS — I480 Paroxysmal atrial fibrillation: Secondary | ICD-10-CM | POA: Diagnosis not present

## 2022-04-04 DIAGNOSIS — R600 Localized edema: Secondary | ICD-10-CM | POA: Diagnosis not present

## 2022-04-04 DIAGNOSIS — N1831 Chronic kidney disease, stage 3a: Secondary | ICD-10-CM | POA: Diagnosis not present

## 2022-04-04 NOTE — Progress Notes (Signed)
? ?Location:  [facility] ?Nursing Home Room Number: 9094/A ?Place of Service:  ALF (13) ?Provider:  Durenda Age, DNP, FNP-BC ? ?Patient Care Team: ?Mast, Man X, NP as PCP - General (Internal Medicine) ?Fay Records, MD as PCP - Cardiology (Cardiology) ? ?Extended Emergency Contact Information ?Primary Emergency Contact: May,Sharon ?Address: [address]  ?         [place], [place] [address] [place] ?Home Phone: [phone] ?Mobile Phone: [phone] ?Relation: Daughter ?Secondary Emergency Contact: Henderson,Miriam ?Address: [address] ?         [place], [place] [address] [place] ?Home Phone: [phone] ?Mobile Phone: [phone] ?Relation: Daughter ? ?Code Status:  DNR ? ?Goals of care: Advanced Directive information ? ?  04/04/2022  ?  3:40 PM  ?Advanced Directives  ?Does Patient Have a Medical Advance Directive? Yes  ?Type of Paramedic of [place];Living will;Out of facility DNR (pink MOST or yellow form)  ?Does patient want to make changes to medical advance directive? No - Patient declined  ?Copy of [place] in Chart? Yes - validated most recent copy scanned in chart (See row information)  ?Pre-existing out of facility DNR order (yellow form or pink MOST form) Yellow form placed in chart (order not valid for inpatient use)  ? ? ? ?Chief Complaint  ?Patient presents with  ? Acute Visit  ?  Patient is being seen for edema on the rt foot  ? ? ?HPI:  ?Pt is a 88 y.o. female seen today for edema on bilateral feet. She was seen in her room today. She reported that her feet were worse yesterday. Noted to have bilateral pedal edema 1+. No erythema noted. She denies pain on her feet. She currently takes Lasix 20 mg daily on  Thursdays and Sundays for lower extremity edema. She has PAF and currently takes Eliquis 5 mg BID and Atenolol 25 mg daily.  ? ? ?Past Medical History:  ?Diagnosis Date  ? Anxiety   ? Aortic  regurgitation   ? Atrial fibrillation ([place])   ? Chest pain   ? Per new patient form  ? Gastritis   ? GERD (gastroesophageal reflux disease)   ? H/O artificial eye lens   ? Hypertension   ? Hypothyroid   ? Left ventricular hypertrophy   ? Major depression   ? Osteoarthritis   ? Osteoporosis   ? Pacemaker   ? On Eliquis, Per new patient form  ? Peptic ulcer disease   ? Stress incontinence   ? ?Past Surgical History:  ?Procedure Laterality Date  ? ABDOMINAL SURGERY    ? Per new patient form  ? BLADDER SURGERY    ? DG  BONE DENSITY ([place] HX)    ? Per new patient form  ? EYE SURGERY    ? KNEE ARTHROPLASTY    ? LEFT HEART CATH AND CORONARY ANGIOGRAPHY N/A 10/22/2019  ? Procedure: LEFT HEART CATH AND CORONARY ANGIOGRAPHY;  Surgeon: Sherren Mocha, MD;  Location: [facility];  Service: Cardiovascular;  Laterality: N/A;  ? LEG SURGERY Right   ? multiple surgeries, unknown type  ? PACEMAKER IMPLANT N/A 06/04/2018  ? Procedure: PACEMAKER IMPLANT;  Surgeon: Deboraha Sprang, MD;  Location: [facility];  Service: Cardiovascular;  Laterality: N/A;  ? ? ?Allergies  ?Allergen Reactions  ? Donepezil Nausea And Vomiting and Other (See Comments)  ?  Severe vomiting  ? Ciprofloxacin Other (See Comments)  ?  Reaction not recalled  ? Latex Other (See Comments)  ?  Reaction not recalled  ? Levofloxacin Other (See Comments)  ?  Reaction not recalled  ? Macrobid [employer] Macro] Other (See Comments)  ?  Reaction not recalled  ? Nitrofurantoin Other (See Comments)  ?  Reaction not recalled  ? Sulfa Antibiotics Other (See Comments)  ?  Reaction not recalled  ? Sulfamethoxazole   ?  Reaction not recalled  ? ? ?Outpatient Encounter Medications as of 04/04/2022  ?Medication Sig  ? apixaban (ELIQUIS) 5 MG TABS tablet Take 5 mg by mouth 2 (two) times daily.  ? atenolol (TENORMIN) 25 MG tablet Take 25 mg by mouth daily.   ? cephALEXin (KEFLEX) 250 MG capsule Take 250 mg by mouth daily.  ? Cholecalciferol (VITAMIN D3) 125  MCG (5000 UT) TABS Take 1 tablet by mouth daily.  ? FLUoxetine (PROZAC) 20 MG capsule Take 20 mg by mouth at bedtime.  ? FLUoxetine (PROZAC) 40 MG capsule Take 40 mg by mouth in the morning.  ? furosemide (LASIX) 20 MG tablet Take 20 mg by mouth. Take Sundays and Thursday's AM  ? ?Per new patient form  ? levothyroxine (SYNTHROID) 100 MCG tablet Take 1 tablet (100 mcg total) by mouth daily before breakfast.  ? loratadine (CLARITIN) 10 MG tablet Take 10 mg by mouth daily.  ? losartan (COZAAR) 50 MG tablet Take 25 mg by mouth daily.  ? Omega-3 Fatty Acids (OMEGA-3 FISH OIL) 1200 MG CAPS Take 2 capsules by mouth daily.  ? Polyethyl Glycol-Propyl Glycol (SYSTANE) 0.4-0.3 % GEL ophthalmic gel Place 1 application into both eyes at bedtime.  ? rosuvastatin (CRESTOR) 20 MG tablet TAKE ONE TABLET EVERY EVENING  ? traMADol (ULTRAM) 50 MG tablet Take 1 tablet (50 mg total) by mouth daily.  ? ?No facility-administered encounter medications on file as of 04/04/2022.  ? ? ?Review of Systems  ?Constitutional:  Negative for appetite change, chills, fatigue and fever.  ?HENT:  Negative for congestion, hearing loss, rhinorrhea and sore throat.   ?Eyes: Negative.   ?Respiratory:  Negative for cough, shortness of breath and wheezing.   ?Cardiovascular:  Positive for leg swelling. Negative for chest pain and palpitations.  ?Gastrointestinal:  Negative for abdominal pain, constipation, diarrhea, nausea and vomiting.  ?Genitourinary:  Negative for dysuria.  ?Musculoskeletal:  Negative for arthralgias, back pain and myalgias.  ?Skin:  Negative for color change, rash and wound.  ?Neurological:  Negative for dizziness, weakness and headaches.  ?Psychiatric/Behavioral:  Negative for behavioral problems. The patient is not nervous/anxious.    ? ? ? ?Immunization History  ?Administered Date(s) Administered  ? Influenza Split 09/13/2017, 09/06/2018, 09/18/2019  ? Influenza, High Dose Seasonal PF 09/04/2021  ? Moderna SARS-COV2 Booster Vaccination  10/13/2020  ? PFIZER(Purple Top)SARS-COV-2 Vaccination 12/16/2019, 01/16/2020  ? PNEUMOCOCCAL CONJUGATE-20 12/23/2021  ? ?Pertinent  Health Maintenance Due  ?Topic Date Due  ? INFLUENZA VACCINE  07/05/2022  ? DEXA SCAN  Completed  ? ? ?  10/20/2019  ?  9:20 PM 10/21/2019  ?  7:49 AM 10/21/2019  ?  9:20 PM 10/22/2019  ?  9:00 AM 12/08/2021  ? 11:10 AM  ?Fall Risk  ?Patient Fall Risk Level Moderate fall risk Moderate fall risk Moderate fall risk Moderate fall risk Low fall risk  ? ? ? ?Vitals:  ? 04/04/22 1539  ?BP: 122/72  ?Pulse: 67  ?Resp: 18  ?Temp: 98.2 ?F (36.8 ?C)  ?SpO2: 98%  ?Weight: 202 lb 6.4 oz (91.8  kg)  ? ?Body mass index is 30.33 kg/m?. ? ?Physical Exam ?Constitutional:   ?   General: She is not in acute distress. ?   Appearance: Normal appearance.  ?HENT:  ?   Head: Normocephalic and atraumatic.  ?   Nose: Nose normal.  ?   Mouth/Throat:  ?   Mouth: Mucous membranes are moist.  ?Eyes:  ?   Conjunctiva/sclera: Conjunctivae normal.  ?   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   Rate and Rhythm: Normal rate and regular rhythm.  ?   Heart sounds: Murmur heard.  ?   Comments: Pacemaker on left chest ?Pulmonary:  ?   Effort: Pulmonary effort is normal.  ?   Breath sounds: Normal breath sounds.  ?Abdominal:  ?   General: Bowel sounds are normal.  ?   Palpations: Abdomen is soft.  ?Musculoskeletal:     ?   General: Swelling present. Normal range of motion.  ?   Cervical back: Normal range of motion.  ?   Comments: Bilateral +1 pedal edema  ?Skin: ?   General: Skin is warm and dry.  ?Neurological:  ?   General: No focal deficit present.  ?   Mental Status: She is alert and oriented to person, place, and time.  ?Psychiatric:     ?   Mood and Affect: Mood normal.     ?   Behavior: Behavior normal.     ?   Thought Content: Thought content normal.     ?   Judgment: Judgment normal.  ?  ? ? ? ?Labs reviewed: ?Recent Labs  ?  12/08/21 ?1122 12/10/21 ?1641 02/14/22 ?1628  ?NA 136 141 140  ?K 3.6 3.7 4.5  ?CL  107 103 102  ?CO2 '22 23 21  '$ ?GLUCOSE 102* 105* 102*  ?BUN 21 24 33*  ?CREATININE 1.01* 1.17* 1.26*  ?CALCIUM 8.9 9.6 9.6  ? ?Recent Labs  ?  12/08/21 ?1630  ?AST 24  ?ALT 24  ?ALKPHOS 52  ?BILITOT 0.6  ?PROT 6.0*

## 2022-04-04 NOTE — Progress Notes (Deleted)
? ?Location:  [facility] ?Nursing Home Room Number: 9094/A ?Place of Service:  ALF (13) ?Provider:  Durenda Age, DNP, FNP-BC ? ?Patient Care Team: ?Mast, Man X, NP as PCP - General (Internal Medicine) ?Fay Records, MD as PCP - Cardiology (Cardiology) ? ?Extended Emergency Contact Information ?Primary Emergency Contact: May,Sharon ?Address: [place]  ?         [place], [place] [address] [place] ?Home Phone: [phone] ?Mobile Phone: [phone] ?Relation: Daughter ?Secondary Emergency Contact: Henderson,Miriam ?Address: [place] ?         [place],  [address] [place] ?Home Phone: [phone] ?Mobile Phone: [phone] ?Relation: Daughter ? ?Code Status:  DNR ? ?Goals of care: Advanced Directive information ? ?  04/04/2022  ?  3:40 PM  ?Advanced Directives  ?Does Patient Have a Medical Advance Directive? Yes  ?Type of Paramedic of [place];Living will;Out of facility DNR (pink MOST or yellow form)  ?Does patient want to make changes to medical advance directive? No - Patient declined  ?Copy of [place] in Chart? Yes - validated most recent copy scanned in chart (See row information)  ?Pre-existing out of facility DNR order (yellow form or pink MOST form) Yellow form placed in chart (order not valid for inpatient use)  ? ? ? ?Chief Complaint  ?Patient presents with  ? Acute Visit  ?  Patient is being seen for edema on the rt foot  ? ? ?HPI:  ?Pt is a 88 y.o. female seen today for medical management of chronic diseases.  *** ? ? ?Past Medical History:  ?Diagnosis Date  ? Anxiety   ? Aortic regurgitation   ? Atrial fibrillation ([place])   ? Chest pain   ? Per new patient form  ? Gastritis   ? GERD (gastroesophageal reflux disease)   ? H/O artificial eye lens   ? Hypertension   ? Hypothyroid   ? Left ventricular hypertrophy   ? Major depression   ? Osteoarthritis   ? Osteoporosis   ? Pacemaker   ? On Eliquis, Per  new patient form  ? Peptic ulcer disease   ? Stress incontinence   ? ?Past Surgical History:  ?Procedure Laterality Date  ? ABDOMINAL SURGERY    ? Per new patient form  ? BLADDER SURGERY    ? DG  BONE DENSITY ([place] HX)    ? Per new patient form  ? EYE SURGERY    ? KNEE ARTHROPLASTY    ? LEFT HEART CATH AND CORONARY ANGIOGRAPHY N/A 10/22/2019  ? Procedure: LEFT HEART CATH AND CORONARY ANGIOGRAPHY;  Surgeon: Sherren Mocha, MD;  Location: [facility];  Service: Cardiovascular;  Laterality: N/A;  ? LEG SURGERY Right   ? multiple surgeries, unknown type  ? PACEMAKER IMPLANT N/A 06/04/2018  ? Procedure: PACEMAKER IMPLANT;  Surgeon: Deboraha Sprang, MD;  Location: [facility];  Service: Cardiovascular;  Laterality: N/A;  ? ? ?Allergies  ?Allergen Reactions  ? Donepezil Nausea And Vomiting and Other (See Comments)  ?  Severe vomiting  ? Ciprofloxacin Other (See Comments)  ?  Reaction not recalled  ? Latex Other (See Comments)  ?  Reaction not recalled  ? Levofloxacin Other (See Comments)  ?  Reaction not recalled  ? Macrobid [employer] Macro] Other (See Comments)  ?  Reaction not recalled  ? Nitrofurantoin Other (See Comments)  ?  Reaction not recalled  ? Sulfa Antibiotics Other (  See Comments)  ?  Reaction not recalled  ? Sulfamethoxazole   ?  Reaction not recalled  ? ? ?Outpatient Encounter Medications as of 04/04/2022  ?Medication Sig  ? apixaban (ELIQUIS) 5 MG TABS tablet Take 5 mg by mouth 2 (two) times daily.  ? atenolol (TENORMIN) 25 MG tablet Take 25 mg by mouth daily.   ? cephALEXin (KEFLEX) 250 MG capsule Take 250 mg by mouth daily.  ? Cholecalciferol (VITAMIN D3) 125 MCG (5000 UT) TABS Take 1 tablet by mouth daily.  ? FLUoxetine (PROZAC) 20 MG capsule Take 20 mg by mouth at bedtime.  ? FLUoxetine (PROZAC) 40 MG capsule Take 40 mg by mouth in the morning.  ? furosemide (LASIX) 20 MG tablet Take 20 mg by mouth. Take Sundays and Thursday's AM  ? ?Per new patient form  ? levothyroxine  (SYNTHROID) 100 MCG tablet Take 1 tablet (100 mcg total) by mouth daily before breakfast.  ? loratadine (CLARITIN) 10 MG tablet Take 10 mg by mouth daily.  ? losartan (COZAAR) 50 MG tablet Take 25 mg by mouth daily.  ? Omega-3 Fatty Acids (OMEGA-3 FISH OIL) 1200 MG CAPS Take 2 capsules by mouth daily.  ? Polyethyl Glycol-Propyl Glycol (SYSTANE) 0.4-0.3 % GEL ophthalmic gel Place 1 application into both eyes at bedtime.  ? rosuvastatin (CRESTOR) 20 MG tablet TAKE ONE TABLET EVERY EVENING  ? traMADol (ULTRAM) 50 MG tablet Take 1 tablet (50 mg total) by mouth daily.  ? ?No facility-administered encounter medications on file as of 04/04/2022.  ? ? ?Review of Systems *** ? ? ? ?Immunization History  ?Administered Date(s) Administered  ? Influenza Split 09/13/2017, 09/06/2018, 09/18/2019  ? Influenza, High Dose Seasonal PF 09/04/2021  ? Moderna SARS-COV2 Booster Vaccination 10/13/2020  ? PFIZER(Purple Top)SARS-COV-2 Vaccination 12/16/2019, 01/16/2020  ? PNEUMOCOCCAL CONJUGATE-20 12/23/2021  ? ?Pertinent  Health Maintenance Due  ?Topic Date Due  ? INFLUENZA VACCINE  07/05/2022  ? DEXA SCAN  Completed  ? ? ?  10/20/2019  ?  9:20 PM 10/21/2019  ?  7:49 AM 10/21/2019  ?  9:20 PM 10/22/2019  ?  9:00 AM 12/08/2021  ? 11:10 AM  ?Fall Risk  ?Patient Fall Risk Level Moderate fall risk Moderate fall risk Moderate fall risk Moderate fall risk Low fall risk  ? ? ? ?Vitals:  ? 04/04/22 1539  ?BP: 122/72  ?Pulse: 67  ?Resp: 18  ?Temp: 98.2 ?F (36.8 ?C)  ?SpO2: 98%  ?Weight: 202 lb 6.4 oz (91.8 kg)  ? ?Body mass index is 30.33 kg/m?. ? ?Physical Exam  ? ? ? ?Labs reviewed: ?Recent Labs  ?  12/08/21 ?1122 12/10/21 ?1641 02/14/22 ?1628  ?NA 136 141 140  ?K 3.6 3.7 4.5  ?CL 107 103 102  ?CO2 '22 23 21  '$ ?GLUCOSE 102* 105* 102*  ?BUN 21 24 33*  ?CREATININE 1.01* 1.17* 1.26*  ?CALCIUM 8.9 9.6 9.6  ? ?Recent Labs  ?  12/08/21 ?1630  ?AST 24  ?ALT 24  ?ALKPHOS 52  ?BILITOT 0.6  ?PROT 6.0*  ?ALBUMIN 3.5  ? ?Recent Labs  ?  12/08/21 ?1122  02/14/22 ?1628  ?WBC 6.6 7.3  ?HGB 11.6* 10.8*  ?HCT 35.7* 33.3*  ?MCV 89.5 89  ?PLT 223 233  ? ?Lab Results  ?Component Value Date  ? TSH 0.178 (L) 02/14/2022  ? ?No results found for: HGBA1C ?No results found for: CHOL, HDL, LDLCALC, LDLDIRECT, TRIG, CHOLHDL ? ?Significant Diagnostic Results in last 30 days:  ?CUP PACEART REMOTE DEVICE CHECK ? ?  Result Date: 03/09/2022 ?Scheduled remote reviewed. Normal device function.  Presenting rhythm AF/flutter in progress from 4/4 @ 00:38, rates in the one teens Burden 6.9%, Eliquis 2 NSVT showing brief 1:1 Next remote 91 days. Route to triage per protocol LA  ? ?Assessment/Plan ?*** ? ? ?Family/ staff Communication: Discussed plan of care with resident and charge nurse ? ?Labs/tests ordered:  ? ? ? ?Durenda Age, DNP, MSN, FNP-BC ?[facility] ?[phone] (Monday-Friday 8:00 a.m. - 5:00 p.m.) ?[phone] (after hours) ? ?

## 2022-04-08 ENCOUNTER — Non-Acute Institutional Stay: Payer: Medicare Other | Admitting: Orthopedic Surgery

## 2022-04-08 ENCOUNTER — Encounter: Payer: Self-pay | Admitting: Orthopedic Surgery

## 2022-04-08 DIAGNOSIS — F320 Major depressive disorder, single episode, mild: Secondary | ICD-10-CM | POA: Diagnosis not present

## 2022-04-08 NOTE — Progress Notes (Signed)
?Location:  [facility] ?Nursing Home Room Number: [id number] ?Place of Service:  ALF (13) ?Provider: Yvonna Alanis, NP ? ? ?Patient Care Team: ?Mast, Man X, NP as PCP - General (Internal Medicine) ?Fay Records, MD as PCP - Cardiology (Cardiology) ? ?Extended Emergency Contact Information ?Primary Emergency Contact: May,Sharon ?Address: [place]  ?         [place], [place] [address] [place] ?Home Phone: [phone] ?Mobile Phone: [phone] ?Relation: Daughter ?Secondary Emergency Contact: Henderson,Miriam ?Address: [place] ?         [place], [place] [address] [place] ?Home Phone: [phone] ?Mobile Phone: [phone] ?Relation: Daughter ? ?Code Status:  DNR  ?Goals of care: Advanced Directive information ? ?  04/08/2022  ? 12:17 PM  ?Advanced Directives  ?Does Patient Have a Medical Advance Directive? Yes  ?Type of Paramedic of [place];Living will;Out of facility DNR (pink MOST or yellow form)  ?Does patient want to make changes to medical advance directive? No - Patient declined  ?Copy of [place] in Chart? Yes - validated most recent copy scanned in chart (See row information)  ?Pre-existing out of facility DNR order (yellow form or pink MOST form) Yellow form placed in chart (order not valid for inpatient use)  ? ? ? ?Chief Complaint  ?Patient presents with  ? Acute Visit  ?  Depression  ? ? ?HPI:  ?Pt is a 88 y.o. female seen today for an acute visit for depression.  ? ?She reports increased depression since moving to [place]. After [place] move, her [place] friends came often to visit. They do not come around a lot anymore. She has been less social since move, but is seen out of her room a few times daily. In addition, she is sad about her poor vision and overall declining health. PHQ 9 score 6. She denies plan to harm herself. She celebrated her 89th birthday the other day. Her sister came to visit. She reports having a good  visit. She also states she has good family support. Treatment options discussed with patient. She would like to try SSRI, not interested in counseling at this time.  ? ? ?Past Medical History:  ?Diagnosis Date  ? Anxiety   ? Aortic regurgitation   ? Atrial fibrillation ([place])   ? Chest pain   ? Per new patient form  ? Gastritis   ? GERD (gastroesophageal reflux disease)   ? H/O artificial eye lens   ? Hypertension   ? Hypothyroid   ? Left ventricular hypertrophy   ? Major depression   ? Osteoarthritis   ? Osteoporosis   ? Pacemaker   ? On Eliquis, Per new patient form  ? Peptic ulcer disease   ? Stress incontinence   ? ?Past Surgical History:  ?Procedure Laterality Date  ? ABDOMINAL SURGERY    ? Per new patient form  ? BLADDER SURGERY    ? DG  BONE DENSITY ([place] HX)    ? Per new patient form  ? EYE SURGERY    ? KNEE ARTHROPLASTY    ? LEFT HEART CATH AND CORONARY ANGIOGRAPHY N/A 10/22/2019  ? Procedure: LEFT HEART CATH AND CORONARY ANGIOGRAPHY;  Surgeon: Sherren Mocha, MD;  Location: [facility];  Service: Cardiovascular;  Laterality: N/A;  ? LEG SURGERY Right   ? multiple surgeries, unknown type  ? PACEMAKER IMPLANT N/A 06/04/2018  ? Procedure: PACEMAKER IMPLANT;  Surgeon: Deboraha Sprang, MD;  Location: [facility];  Service: Cardiovascular;  Laterality: N/A;  ? ? ?Allergies  ?Allergen Reactions  ? Donepezil Nausea And Vomiting and Other (See Comments)  ?  Severe vomiting  ? Ciprofloxacin Other (See Comments)  ?  Reaction not recalled  ? Latex Other (See Comments)  ?  Reaction not recalled  ? Levofloxacin Other (See Comments)  ?  Reaction not recalled  ? Macrobid [employer] Macro] Other (See Comments)  ?  Reaction not recalled  ? Nitrofurantoin Other (See Comments)  ?  Reaction not recalled  ? Sulfa Antibiotics Other (See Comments)  ?  Reaction not recalled  ? Sulfamethoxazole   ?  Reaction not recalled  ? ? ?Outpatient Encounter Medications as of 04/08/2022  ?Medication Sig  ? apixaban  (ELIQUIS) 5 MG TABS tablet Take 5 mg by mouth 2 (two) times daily.  ? atenolol (TENORMIN) 25 MG tablet Take 25 mg by mouth daily.   ? cephALEXin (KEFLEX) 250 MG capsule Take 250 mg by mouth daily.  ? Cholecalciferol (VITAMIN D3) 125 MCG (5000 UT) TABS Take 1 tablet by mouth daily.  ? FLUoxetine (PROZAC) 20 MG capsule Take 20 mg by mouth at bedtime.  ? FLUoxetine (PROZAC) 40 MG capsule Take 40 mg by mouth in the morning.  ? furosemide (LASIX) 20 MG tablet Take 20 mg by mouth daily. Mon, Wed, Fri  ? levothyroxine (SYNTHROID) 100 MCG tablet Take 1 tablet (100 mcg total) by mouth daily before breakfast.  ? losartan (COZAAR) 50 MG tablet Take 25 mg by mouth daily.  ? Omega-3 Fatty Acids (OMEGA-3 FISH OIL) 1200 MG CAPS Take 2 capsules by mouth daily.  ? Polyethyl Glycol-Propyl Glycol (SYSTANE) 0.4-0.3 % GEL ophthalmic gel Place 1 application into both eyes at bedtime.  ? rosuvastatin (CRESTOR) 20 MG tablet TAKE ONE TABLET EVERY EVENING  ? traMADol (ULTRAM) 50 MG tablet Take 1 tablet (50 mg total) by mouth daily.  ? [DISCONTINUED] loratadine (CLARITIN) 10 MG tablet Take 10 mg by mouth daily.  ? ?No facility-administered encounter medications on file as of 04/08/2022.  ? ? ?Review of Systems  ?Constitutional:  Negative for activity change, appetite change, fatigue and fever.  ?Respiratory:  Negative for cough, shortness of breath and wheezing.   ?Cardiovascular:  Negative for chest pain and leg swelling.  ?Psychiatric/Behavioral:  Positive for dysphoric mood and sleep disturbance. Negative for confusion. The patient is not nervous/anxious.   ? ?Immunization History  ?Administered Date(s) Administered  ? Influenza Split 09/13/2017, 09/06/2018, 09/18/2019  ? Influenza, High Dose Seasonal PF 09/04/2021  ? Moderna SARS-COV2 Booster Vaccination 10/13/2020  ? PFIZER(Purple Top)SARS-COV-2 Vaccination 12/16/2019, 01/16/2020  ? PNEUMOCOCCAL CONJUGATE-20 12/23/2021  ? ?Pertinent  Health Maintenance Due  ?Topic Date Due  ? INFLUENZA  VACCINE  07/05/2022  ? DEXA SCAN  Completed  ? ? ?  10/20/2019  ?  9:20 PM 10/21/2019  ?  7:49 AM 10/21/2019  ?  9:20 PM 10/22/2019  ?  9:00 AM 12/08/2021  ? 11:10 AM  ?Fall Risk  ?Patient Fall Risk Level Moderate fall risk Moderate fall risk Moderate fall risk Moderate fall risk Low fall risk  ? ?Functional Status Survey: ?  ? ?Vitals:  ? 04/08/22 1212  ?BP: 140/80  ?Pulse: 67  ?Resp: 18  ?Temp: 98.5 ?F (36.9 ?C)  ?SpO2: 95%  ?Weight: 202 lb 6.4 oz (91.8 kg)  ?Height: 5' 8.5" (1.74 m)  ? ?Body mass index is 30.33 kg/m?Marland Kitchen ?Physical Exam ?Vitals reviewed.  ?HENT:  ?  Head: Normocephalic.  ?Eyes:  ?   General:     ?   Right eye: No discharge.     ?   Left eye: No discharge.  ?Cardiovascular:  ?   Rate and Rhythm: Normal rate. Rhythm irregular.  ?   Pulses: Normal pulses.  ?   Heart sounds: Normal heart sounds.  ?Pulmonary:  ?   Effort: Pulmonary effort is normal. No respiratory distress.  ?   Breath sounds: Normal breath sounds. No wheezing.  ?Abdominal:  ?   General: Bowel sounds are normal. There is no distension.  ?   Palpations: Abdomen is soft.  ?   Tenderness: There is no abdominal tenderness.  ?Musculoskeletal:  ?   Cervical back: Neck supple.  ?   Right lower leg: No edema.  ?   Left lower leg: No edema.  ?Skin: ?   General: Skin is warm and dry.  ?   Capillary Refill: Capillary refill takes less than 2 seconds.  ?Neurological:  ?   General: No focal deficit present.  ?   Mental Status: She is alert and oriented to person, place, and time.  ?   Motor: Weakness present.  ?   Gait: Gait abnormal.  ?   Comments: walker  ?Psychiatric:     ?   Mood and Affect: Mood normal.     ?   Behavior: Behavior normal.  ? ? ?Labs reviewed: ?Recent Labs  ?  12/08/21 ?1122 12/10/21 ?1641 02/14/22 ?1628  ?NA 136 141 140  ?K 3.6 3.7 4.5  ?CL 107 103 102  ?CO2 '22 23 21  '$ ?GLUCOSE 102* 105* 102*  ?BUN 21 24 33*  ?CREATININE 1.01* 1.17* 1.26*  ?CALCIUM 8.9 9.6 9.6  ? ?Recent Labs  ?  12/08/21 ?1630  ?AST 24  ?ALT 24  ?ALKPHOS 52   ?BILITOT 0.6  ?PROT 6.0*  ?ALBUMIN 3.5  ? ?Recent Labs  ?  12/08/21 ?1122 02/14/22 ?1628  ?WBC 6.6 7.3  ?HGB 11.6* 10.8*  ?HCT 35.7* 33.3*  ?MCV 89.5 89  ?PLT 223 233  ? ?Lab Results  ?Component Value Date  ? TSH 0.

## 2022-04-11 ENCOUNTER — Other Ambulatory Visit: Payer: Self-pay | Admitting: Adult Health

## 2022-04-11 ENCOUNTER — Encounter: Payer: Self-pay | Admitting: Adult Health

## 2022-04-11 ENCOUNTER — Non-Acute Institutional Stay: Payer: Medicare Other | Admitting: Adult Health

## 2022-04-11 DIAGNOSIS — R21 Rash and other nonspecific skin eruption: Secondary | ICD-10-CM | POA: Diagnosis not present

## 2022-04-11 DIAGNOSIS — M1990 Unspecified osteoarthritis, unspecified site: Secondary | ICD-10-CM

## 2022-04-11 MED ORDER — TRAMADOL HCL 50 MG PO TABS: 50.0000 mg | ORAL_TABLET | Freq: Every day | ORAL | 0 refills | Status: DC

## 2022-04-11 NOTE — Progress Notes (Signed)
? ?Location:  [facility] ?Nursing Home Room Number: [id number] ?Place of Service:  ALF (13) ?Provider:  Durenda Age, DNP, FNP-BC ? ?Patient Care Team: ?Mast, Man X, NP as PCP - General (Internal Medicine) ?Fay Records, MD as PCP - Cardiology (Cardiology) ? ?Extended Emergency Contact Information ?Primary Emergency Contact: May,Sharon ?Address: [place]  ?         [place], [place] [address] [place] ?Home Phone: [phone] ?Mobile Phone: [phone] ?Relation: Daughter ?Secondary Emergency Contact: Henderson,Miriam ?Address: [place] ?         [place], [place] [address] [place] ?Home Phone: [phone] ?Mobile Phone: [phone] ?Relation: Daughter ? ?Code Status:  DNR ? ?Goals of care: Advanced Directive information ? ?  04/11/2022  ?  3:42 PM  ?Advanced Directives  ?Does Patient Have a Medical Advance Directive? Yes  ?Type of Paramedic of [place];Living will;Out of facility DNR (pink MOST or yellow form)  ?Does patient want to make changes to medical advance directive? No - Patient declined  ?Copy of [place] in Chart? Yes - validated most recent copy scanned in chart (See row information)  ?Pre-existing out of facility DNR order (yellow form or pink MOST form) Pink MOST/Yellow Form most recent copy in chart - Physician notified to receive inpatient order  ? ? ? ?Chief Complaint  ?Patient presents with  ? Acute Visit  ?  Bleeding lesion on back.  ? ? ?HPI:  ?Pt is a 88 y.o. female seen today for an acute visit. She was reported to have a bleeding lesion on her back. She was seen in her room today. She complained of an area on her back, middle of bra line which itches. She would sometimes rub her back on a chair or on her bed. She was, also, gifted a back Copy. Noted area to have a dot with open are surrounded by dry/scaly pink skin. No bleeding was noted. ? ? ?Past Medical History:  ?Diagnosis Date  ? Anxiety    ? Aortic regurgitation   ? Atrial fibrillation ([place])   ? Chest pain   ? Per new patient form  ? Gastritis   ? GERD (gastroesophageal reflux disease)   ? H/O artificial eye lens   ? Hypertension   ? Hypothyroid   ? Left ventricular hypertrophy   ? Major depression   ? Osteoarthritis   ? Osteoporosis   ? Pacemaker   ? On Eliquis, Per new patient form  ? Peptic ulcer disease   ? Stress incontinence   ? ?Past Surgical History:  ?Procedure Laterality Date  ? ABDOMINAL SURGERY    ? Per new patient form  ? BLADDER SURGERY    ? DG  BONE DENSITY ([place] HX)    ? Per new patient form  ? EYE SURGERY    ? KNEE ARTHROPLASTY    ? LEFT HEART CATH AND CORONARY ANGIOGRAPHY N/A 10/22/2019  ? Procedure: LEFT HEART CATH AND CORONARY ANGIOGRAPHY;  Surgeon: Sherren Mocha, MD;  Location: [facility];  Service: Cardiovascular;  Laterality: N/A;  ? LEG SURGERY Right   ? multiple surgeries, unknown type  ? PACEMAKER IMPLANT N/A 06/04/2018  ? Procedure: PACEMAKER IMPLANT;  Surgeon: Deboraha Sprang, MD;  Location: [facility];  Service: Cardiovascular;  Laterality: N/A;  ? ? ?Allergies  ?Allergen Reactions  ? Donepezil Nausea And Vomiting and Other (See Comments)  ?  Severe vomiting  ? Ciprofloxacin Other (  See Comments)  ?  Reaction not recalled  ? Latex Other (See Comments)  ?  Reaction not recalled  ? Levofloxacin Other (See Comments)  ?  Reaction not recalled  ? Macrobid [employer] Macro] Other (See Comments)  ?  Reaction not recalled  ? Nitrofurantoin Other (See Comments)  ?  Reaction not recalled  ? Sulfa Antibiotics Other (See Comments)  ?  Reaction not recalled  ? Sulfamethoxazole   ?  Reaction not recalled  ? ? ?Outpatient Encounter Medications as of 04/11/2022  ?Medication Sig  ? apixaban (ELIQUIS) 5 MG TABS tablet Take 5 mg by mouth 2 (two) times daily.  ? atenolol (TENORMIN) 25 MG tablet Take 25 mg by mouth daily.   ? cephALEXin (KEFLEX) 250 MG capsule Take 250 mg by mouth daily.  ? Cholecalciferol  (VITAMIN D3) 125 MCG (5000 UT) TABS Take 1 tablet by mouth daily.  ? FLUoxetine (PROZAC) 20 MG capsule Take 20 mg by mouth at bedtime.  ? FLUoxetine (PROZAC) 40 MG capsule Take 40 mg by mouth in the morning.  ? furosemide (LASIX) 20 MG tablet Take 20 mg by mouth daily. Mon, Wed, Fri  ? levothyroxine (SYNTHROID) 88 MCG tablet Take 88 mcg by mouth daily.  ? losartan (COZAAR) 50 MG tablet Take 25 mg by mouth daily.  ? Omega-3 Fatty Acids (OMEGA-3 FISH OIL) 1200 MG CAPS Take 2 capsules by mouth daily.  ? Polyethyl Glycol-Propyl Glycol (SYSTANE) 0.4-0.3 % GEL ophthalmic gel Place 1 application into both eyes at bedtime.  ? rosuvastatin (CRESTOR) 20 MG tablet TAKE ONE TABLET EVERY EVENING  ? traMADol (ULTRAM) 50 MG tablet Take 1 tablet (50 mg total) by mouth daily.  ? triamcinolone ointment (KENALOG) 0.1 % Apply 1 application. topically 2 (two) times daily.  ? [DISCONTINUED] levothyroxine (SYNTHROID) 100 MCG tablet Take 1 tablet (100 mcg total) by mouth daily before breakfast.  ? ?No facility-administered encounter medications on file as of 04/11/2022.  ? ? ?Review of Systems  ?Constitutional:  Negative for appetite change, chills, fatigue and fever.  ?HENT:  Negative for congestion, hearing loss, rhinorrhea and sore throat.   ?Eyes: Negative.   ?Respiratory:  Negative for cough, shortness of breath and wheezing.   ?Cardiovascular:  Negative for chest pain, palpitations and leg swelling.  ?Gastrointestinal:  Negative for abdominal pain, constipation, diarrhea, nausea and vomiting.  ?Genitourinary:  Negative for dysuria.  ?Musculoskeletal:  Negative for arthralgias, back pain and myalgias.  ?Skin:  Positive for rash. Negative for color change and wound.  ?Neurological:  Negative for dizziness, weakness and headaches.  ?Psychiatric/Behavioral:  Negative for behavioral problems. The patient is not nervous/anxious.    ? ? ? ?Immunization History  ?Administered Date(s) Administered  ? Influenza Split 09/13/2017, 09/06/2018,  09/18/2019  ? Influenza, High Dose Seasonal PF 09/04/2021  ? Moderna SARS-COV2 Booster Vaccination 10/13/2020  ? PFIZER(Purple Top)SARS-COV-2 Vaccination 12/16/2019, 01/16/2020  ? PNEUMOCOCCAL CONJUGATE-20 12/23/2021  ? ?Pertinent  Health Maintenance Due  ?Topic Date Due  ? INFLUENZA VACCINE  07/05/2022  ? DEXA SCAN  Completed  ? ? ?  10/20/2019  ?  9:20 PM 10/21/2019  ?  7:49 AM 10/21/2019  ?  9:20 PM 10/22/2019  ?  9:00 AM 12/08/2021  ? 11:10 AM  ?Fall Risk  ?Patient Fall Risk Level Moderate fall risk Moderate fall risk Moderate fall risk Moderate fall risk Low fall risk  ? ? ? ?Vitals:  ? 04/11/22 1535  ?BP: 124/80  ?Pulse: 72  ?Temp: 97.6 ?F (36.4 ?  C)  ?SpO2: 97%  ?Weight: 202 lb 6.4 oz (91.8 kg)  ?Height: 5' 8.5" (1.74 m)  ? ?Body mass index is 30.33 kg/m?. ? ?Physical Exam ?Constitutional:   ?   General: She is not in acute distress. ?   Appearance: She is obese.  ?HENT:  ?   Head: Normocephalic and atraumatic.  ?   Nose: Nose normal.  ?   Mouth/Throat:  ?   Mouth: Mucous membranes are moist.  ?Eyes:  ?   Conjunctiva/sclera: Conjunctivae normal.  ?   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   Rate and Rhythm: Normal rate and regular rhythm.  ?Pulmonary:  ?   Effort: Pulmonary effort is normal.  ?   Breath sounds: Normal breath sounds.  ?Abdominal:  ?   General: Bowel sounds are normal.  ?   Palpations: Abdomen is soft.  ?Musculoskeletal:     ?   General: Normal range of motion.  ?   Cervical back: Normal range of motion.  ?Skin: ?   General: Skin is warm and dry.  ?   Findings: Rash present.  ?   Comments: Dry/scaly rash on mid upper back.  ?Neurological:  ?   General: No focal deficit present.  ?   Mental Status: She is alert and oriented to person, place, and time.  ?Psychiatric:     ?   Mood and Affect: Mood normal.     ?   Behavior: Behavior normal.  ?  ? ? ? ?Labs reviewed: ?Recent Labs  ?  12/08/21 ?1122 12/10/21 ?1641 02/14/22 ?1628  ?NA 136 141 140  ?K 3.6 3.7 4.5  ?CL 107 103 102  ?CO2 '22 23 21   '$ ?GLUCOSE 102* 105* 102*  ?BUN 21 24 33*  ?CREATININE 1.01* 1.17* 1.26*  ?CALCIUM 8.9 9.6 9.6  ? ?Recent Labs  ?  12/08/21 ?1630  ?AST 24  ?ALT 24  ?ALKPHOS 52  ?BILITOT 0.6  ?PROT 6.0*  ?ALBUMIN 3.5  ? ?Recent

## 2022-04-13 ENCOUNTER — Ambulatory Visit
Admission: RE | Admit: 2022-04-13 | Discharge: 2022-04-13 | Disposition: A | Discharge status: Home / Self Care | Payer: Medicare Other | Source: Ambulatory Visit | Attending: Internal Medicine | Admitting: Internal Medicine

## 2022-04-13 DIAGNOSIS — M810 Age-related osteoporosis without current pathological fracture: Secondary | ICD-10-CM

## 2022-04-15 ENCOUNTER — Telehealth: Payer: Self-pay

## 2022-04-15 NOTE — Telephone Encounter (Signed)
-----   Message from Deboraha Sprang, MD sent at 04/14/2022 10:52 AM EDT ----- ?Please Inform Patient ?That she is in persistent atrial fib.  Over the last month-- any attributable symptoms of exercise intolerance or SOB to suggest value of cardioversion ?Thanks  ?

## 2022-04-15 NOTE — Telephone Encounter (Signed)
Attempted phone call to pt.  Voicemail states it is Sharon May, pt's daughter, [place].  Left voicemail message to contact RN at [phone] ?

## 2022-04-18 ENCOUNTER — Other Ambulatory Visit: Payer: Self-pay | Admitting: Orthopedic Surgery

## 2022-04-18 NOTE — Progress Notes (Signed)
Nursing reports increased nonpitting edema to BLE, will increase furosemide to M/W/F/Sat.  ?

## 2022-04-18 NOTE — Telephone Encounter (Signed)
Patient's daughter returning call. 

## 2022-04-18 NOTE — Telephone Encounter (Addendum)
Spoke with pt's daughter, Harlow Asa and advised per Dr Caryl Comes device check shows pt has been in persistent Afib and would like to know if pt has been more symptomatic especially with exercise intolerance.  Pt's daughter states pt has recently moved to assisted living.  She has noticed more complaints of fatigue and some increased SOB on exertion.  Pt has recently had her Furosemide increased due to LEE.   Discussed the possibility of cardioversion to help with symptoms.  Pt's daughter states she will discuss with her sister who is also, POA and will contact RN once she has done this if needed.  Pt's daughter thanked Therapist, sports for the phone call.

## 2022-04-27 ENCOUNTER — Telehealth: Payer: Self-pay

## 2022-04-27 NOTE — Telephone Encounter (Signed)
Patient and her daughter have called in with help sending a transmission and kept getting 57 code. I called tech support and they stated that the patient needs a new monitor and I have sent it to the address on file. I have not been able to reach the patient to let her know as they hung up

## 2022-04-27 NOTE — Telephone Encounter (Signed)
Pt's daughter called back. Transferred to [employer], [place].

## 2022-04-27 NOTE — Telephone Encounter (Signed)
Dr. Caryl Comes spoke with daughter Ivin Booty.  Advised that we are ordering a new monitor for Pt.   Need to determine what pulse is.  In NSR Pt's heart rate is 60.  In afib it is 80-90 BPM.  Follow up will be determined by burden of afib and symptoms.  Left message for Brien Mates [phone] requesting call back.

## 2022-04-27 NOTE — Telephone Encounter (Signed)
Outreach made to Pt per Dr. Caryl Comes requesting manual transmission.  She is currently visiting her daughter, but will return home soon and send transmission.  Direct # given to device clinic if any assistance needed with manual transmission.

## 2022-04-28 ENCOUNTER — Telehealth: Payer: Self-pay | Admitting: Internal Medicine

## 2022-04-28 NOTE — Telephone Encounter (Signed)
Ivin Booty, pt's daughter is calling stating [facility] is needing an order faxed to [phone] to ATTN: Tywan for them to record patient's pulse with however many times of day needed due to her monitor being broken. Please advise.

## 2022-04-28 NOTE — Telephone Encounter (Signed)
I spoke with [facility] and let them know when she receive the new handheld to have the patient send a transmission. She verbalized understanding.

## 2022-04-29 NOTE — Telephone Encounter (Signed)
Left voicemail informing patients daughter that there is not a protocol form the device clinic standpoint for how often to monitor patients heart rate, and that to follow facility protocols for monitoring of heart rate.

## 2022-05-09 NOTE — Telephone Encounter (Signed)
Letter sent 05-09-2022 

## 2022-05-26 ENCOUNTER — Non-Acute Institutional Stay: Payer: Medicare Other | Admitting: Adult Health

## 2022-05-26 ENCOUNTER — Encounter: Payer: Self-pay | Admitting: Adult Health

## 2022-05-26 DIAGNOSIS — I10 Essential (primary) hypertension: Secondary | ICD-10-CM

## 2022-05-26 DIAGNOSIS — J189 Pneumonia, unspecified organism: Secondary | ICD-10-CM

## 2022-05-26 LAB — BASIC METABOLIC PANEL
BUN: 27 — AB (ref 4–21)
CO2: 20 (ref 13–22)
Chloride: 106 (ref 99–108)
Creatinine: 1.1 (ref 0.5–1.1)
Glucose: 231
Potassium: 3.6 mEq/L (ref 3.5–5.1)
Sodium: 138 (ref 137–147)

## 2022-05-26 LAB — CBC AND DIFFERENTIAL
HCT: 31 — AB (ref 36–46)
Hemoglobin: 9.6 — AB (ref 12.0–16.0)
Neutrophils Absolute: 10463
WBC: 11.6

## 2022-05-26 LAB — COMPREHENSIVE METABOLIC PANEL
Calcium: 8.9 (ref 8.7–10.7)
eGFR: 46

## 2022-05-26 LAB — CBC: RBC: 3.57 — AB (ref 3.87–5.11)

## 2022-05-26 NOTE — Progress Notes (Unsigned)
Location:  [facility] Room Number: AL/904/A Place of Service:  ALF (13) Provider:  Durenda Age, DNP, FNP-BC  Patient Care Team: Mast, Man X, NP as PCP - General (Internal Medicine) Fay Records, MD as PCP - Cardiology (Cardiology)  Extended Emergency Contact Information Primary Emergency Contact: May,Sharon Address: [place] [place]           [place], [place] [address] [name] of [place] Phone: [phone] Mobile Phone: [phone] Relation: Daughter Secondary Emergency Contact: Henderson,Miriam Address: [address]          [place], [place] [address] [name] of [place] Phone: [phone] Mobile Phone: [phone] Relation: Daughter  Code Status:  DNR  Goals of care: Advanced Directive information    05/26/2022    2:19 PM  Advanced Directives  Does Patient Have a Medical Advance Directive? Yes  Type of Paramedic of [place];Living will;Out of facility DNR (pink MOST or yellow form)  Does patient want to make changes to medical advance directive? No - Patient declined  Copy of [place] in Chart? Yes - validated most recent copy scanned in chart (See row information)  Pre-existing out of facility DNR order (yellow form or pink MOST form) Pink MOST/Yellow Form most recent copy in chart - Physician notified to receive inpatient order     Chief Complaint  Patient presents with   Acute Visit    Patient is being seen for Hypertension    HPI:  Pt is a 88 y.o. female seen today for medical management of chronic diseases.  ***   Past Medical History:  Diagnosis Date   Anxiety    Aortic regurgitation    Atrial fibrillation (HCC)    Chest pain    Per new patient form   Gastritis    GERD (gastroesophageal reflux disease)    H/O artificial eye lens    Hypertension    Hypothyroid    Left ventricular hypertrophy    Major depression    Osteoarthritis    Osteoporosis     Pacemaker    On Eliquis, Per new patient form   Peptic ulcer disease    Stress incontinence    Past Surgical History:  Procedure Laterality Date   ABDOMINAL SURGERY     Per new patient form   BLADDER SURGERY     DG  BONE DENSITY ([place] HX)     Per new patient form   EYE SURGERY     KNEE ARTHROPLASTY     LEFT HEART CATH AND CORONARY ANGIOGRAPHY N/A 10/22/2019   Procedure: LEFT HEART CATH AND CORONARY ANGIOGRAPHY;  Surgeon: Sherren Mocha, MD;  Location: [facility];  Service: Cardiovascular;  Laterality: N/A;   LEG SURGERY Right    multiple surgeries, unknown type   PACEMAKER IMPLANT N/A 06/04/2018   Procedure: PACEMAKER IMPLANT;  Surgeon: Deboraha Sprang, MD;  Location: [facility];  Service: Cardiovascular;  Laterality: N/A;    Allergies  Allergen Reactions   Donepezil Nausea And Vomiting and Other (See Comments)    Severe vomiting   Ciprofloxacin Other (See Comments)    Reaction not recalled   Latex Other (See Comments)    Reaction not recalled   Levofloxacin Other (See Comments)    Reaction not recalled   Macrobid [Nitrofurantoin Monohyd Macro] Other (See Comments)    Reaction not recalled   Nitrofurantoin Other (See Comments)    Reaction not recalled   Sulfa Antibiotics Other (  See Comments)    Reaction not recalled   Sulfamethoxazole     Reaction not recalled    Outpatient Encounter Medications as of 05/26/2022  Medication Sig   apixaban (ELIQUIS) 5 MG TABS tablet Take 5 mg by mouth 2 (two) times daily.   atenolol (TENORMIN) 25 MG tablet Take 25 mg by mouth daily.    cephALEXin (KEFLEX) 250 MG capsule Take 250 mg by mouth daily.   Cholecalciferol (VITAMIN D3) 125 MCG (5000 UT) TABS Take 1 tablet by mouth daily.   FLUoxetine (PROZAC) 20 MG capsule Take 20 mg by mouth at bedtime.   FLUoxetine (PROZAC) 40 MG capsule Take 40 mg by mouth in the morning.   furosemide (LASIX) 20 MG tablet Take 20 mg by mouth daily. Mon, Wed, Fri, Sat   levothyroxine  (SYNTHROID) 88 MCG tablet Take 88 mcg by mouth daily.   losartan (COZAAR) 50 MG tablet Take 25 mg by mouth daily.   Omega-3 Fatty Acids (OMEGA-3 FISH OIL) 1200 MG CAPS Take 2 capsules by mouth daily.   Polyethyl Glycol-Propyl Glycol (SYSTANE) 0.4-0.3 % GEL ophthalmic gel Place 1 application into both eyes at bedtime.   rosuvastatin (CRESTOR) 20 MG tablet TAKE ONE TABLET EVERY EVENING   traMADol (ULTRAM) 50 MG tablet Take 1 tablet (50 mg total) by mouth daily.   No facility-administered encounter medications on file as of 05/26/2022.    Review of Systems ***    Immunization History  Administered Date(s) Administered   Influenza Split 09/13/2017, 09/06/2018, 09/18/2019   Influenza, High Dose Seasonal PF 09/04/2021   Moderna SARS-COV2 Booster Vaccination 10/13/2020   PFIZER(Purple Top)SARS-COV-2 Vaccination 12/16/2019, 01/16/2020   PNEUMOCOCCAL CONJUGATE-20 12/23/2021   Pertinent  Health Maintenance Due  Topic Date Due   INFLUENZA VACCINE  07/05/2022   DEXA SCAN  Completed      10/20/2019    9:20 PM 10/21/2019    7:49 AM 10/21/2019    9:20 PM 10/22/2019    9:00 AM 12/08/2021   11:10 AM  Fall Risk  Patient Fall Risk Level Moderate fall risk Moderate fall risk Moderate fall risk Moderate fall risk Low fall risk     There were no vitals filed for this visit. There is no height or weight on file to calculate BMI.  Physical Exam     Labs reviewed: Recent Labs    12/08/21 1122 12/10/21 1641 02/14/22 1628  NA 136 141 140  K 3.6 3.7 4.5  CL 107 103 102  CO2 '22 23 21  '$ GLUCOSE 102* 105* 102*  BUN 21 24 33*  CREATININE 1.01* 1.17* 1.26*  CALCIUM 8.9 9.6 9.6   Recent Labs    12/08/21 1630  AST 24  ALT 24  ALKPHOS 52  BILITOT 0.6  PROT 6.0*  ALBUMIN 3.5   Recent Labs    12/08/21 1122 02/14/22 1628  WBC 6.6 7.3  HGB 11.6* 10.8*  HCT 35.7* 33.3*  MCV 89.5 89  PLT 223 233   Lab Results  Component Value Date   TSH 0.23 (A) 03/31/2022   No results found  for: "HGBA1C" Lab Results  Component Value Date   CHOL 138 02/08/2022   HDL 71 (A) 02/08/2022   LDLCALC 55 02/08/2022   TRIG 42 02/08/2022    Significant Diagnostic Results in last 30 days:  No results found.  Assessment/Plan ***   Family/ staff Communication: Discussed plan of care with resident and charge nurse  Labs/tests ordered:     Durenda Age, DNP, MSN,  FNP-BC [facility] and Adult Medicine [phone] (Monday-Friday 8:00 a.m. - 5:00 p.m.) [phone] (after hours)

## 2022-05-31 ENCOUNTER — Encounter: Payer: Self-pay | Admitting: Internal Medicine

## 2022-05-31 ENCOUNTER — Ambulatory Visit (INDEPENDENT_AMBULATORY_CARE_PROVIDER_SITE_OTHER): Payer: Medicare Other | Admitting: Internal Medicine

## 2022-05-31 VITALS — BP 120/80 | HR 62 | Ht 68.0 in | Wt 207.0 lb

## 2022-05-31 DIAGNOSIS — I10 Essential (primary) hypertension: Secondary | ICD-10-CM | POA: Diagnosis not present

## 2022-06-08 ENCOUNTER — Ambulatory Visit (INDEPENDENT_AMBULATORY_CARE_PROVIDER_SITE_OTHER): Payer: Medicare Other

## 2022-06-08 DIAGNOSIS — I495 Sick sinus syndrome: Secondary | ICD-10-CM | POA: Diagnosis not present

## 2022-06-11 LAB — CUP PACEART REMOTE DEVICE CHECK
Battery Remaining Longevity: 120 mo
Battery Voltage: 3.01 V
Brady Statistic AP VP Percent: 0.23 %
Brady Statistic AP VS Percent: 85.87 %
Brady Statistic AS VP Percent: 0.02 %
Brady Statistic AS VS Percent: 13.88 %
Brady Statistic RA Percent Paced: 47.72 %
Brady Statistic RV Percent Paced: 11.17 %
Date Time Interrogation Session: 20230705015106
Implantable Lead Implant Date: 20190701
Implantable Lead Implant Date: 20190701
Implantable Lead Location: 753859
Implantable Lead Location: 753860
Implantable Lead Model: 5076
Implantable Lead Model: 5076
Implantable Pulse Generator Implant Date: 20190701
Lead Channel Impedance Value: 304 Ohm
Lead Channel Impedance Value: 342 Ohm
Lead Channel Impedance Value: 380 Ohm
Lead Channel Impedance Value: 418 Ohm
Lead Channel Pacing Threshold Amplitude: 0.625 V
Lead Channel Pacing Threshold Amplitude: 1.125 V
Lead Channel Pacing Threshold Pulse Width: 0.4 ms
Lead Channel Pacing Threshold Pulse Width: 0.4 ms
Lead Channel Sensing Intrinsic Amplitude: 1.125 mV
Lead Channel Sensing Intrinsic Amplitude: 1.125 mV
Lead Channel Sensing Intrinsic Amplitude: 9.875 mV
Lead Channel Sensing Intrinsic Amplitude: 9.875 mV
Lead Channel Setting Pacing Amplitude: 1.5 V
Lead Channel Setting Pacing Amplitude: 2.5 V
Lead Channel Setting Pacing Pulse Width: 0.4 ms
Lead Channel Setting Sensing Sensitivity: 2 mV

## 2022-06-15 ENCOUNTER — Non-Acute Institutional Stay: Payer: Medicare Other | Admitting: Adult Health

## 2022-06-15 ENCOUNTER — Encounter: Payer: Self-pay | Admitting: Adult Health

## 2022-06-15 DIAGNOSIS — I10 Essential (primary) hypertension: Secondary | ICD-10-CM

## 2022-06-15 DIAGNOSIS — N1831 Chronic kidney disease, stage 3a: Secondary | ICD-10-CM

## 2022-06-15 DIAGNOSIS — R600 Localized edema: Secondary | ICD-10-CM | POA: Diagnosis not present

## 2022-06-15 NOTE — Progress Notes (Signed)
Location:  [facility] Room Number: AL/904/A Place of Service:  ALF (13) Provider:  Durenda Age, DNP, FNP-BC  Patient Care Team: Mast, Man X, NP as PCP - General (Internal Medicine) Fay Records, MD as PCP - Cardiology (Cardiology)  Extended Emergency Contact Information Primary Emergency Contact: May,Sharon Address: [place]           [place], [place] [address] [name] of [place] Phone: [phone] Mobile Phone: [phone] Relation: Daughter Secondary Emergency Contact: Henderson,Miriam Address: [address]          [place], [place] [address] [name] of [place] Phone: [phone] Mobile Phone: [phone] Relation: Daughter  Code Status:  DNR  Goals of care: Advanced Directive information    06/15/2022    9:51 AM  Advanced Directives  Does Patient Have a Medical Advance Directive? Yes  Type of Paramedic of [place];Living will;Out of facility DNR (pink MOST or yellow form)  Does patient want to make changes to medical advance directive? No - Patient declined  Copy of [place] in Chart? Yes - validated most recent copy scanned in chart (See row information)  Pre-existing out of facility DNR order (yellow form or pink MOST form) Pink MOST/Yellow Form most recent copy in chart - Physician notified to receive inpatient order     Chief Complaint  Patient presents with   Acute Visit    Patient is here for bilateral edema to lower extremities ( foot, ankle)    HPI:  Faith Parker is a 88 y.o. female seen today for an acute visit regarding bilateral foot/ankle edema. She is a resident of [facility]. She was seen today and was noted to have Bilateral feet 2+edema, non-tender. She currently takes Lasix 20 mg daily on Monday, Wednesday, Thursday, Friday and Saturday for lower extremity edema and chronic diastolic CHF. She denies SOB. Latest weight is 196.2 lbs, had weight  loss of 4.8 lbs in 30 days. She was earlier seen in the lounge area sitting with other residents. SBPs ranging from 120 to 142, with outlier 86 and 90. She takes artan 37.5 mg daily for hypertension.    Past Medical History:  Diagnosis Date   Anxiety    Aortic regurgitation    Atrial fibrillation (HCC)    Chest pain    Per new patient form   Gastritis    GERD (gastroesophageal reflux disease)    H/O artificial eye lens    Hypertension    Hypothyroid    Left ventricular hypertrophy    Major depression    Osteoarthritis    Osteoporosis    Pacemaker    On Eliquis, Per new patient form   Peptic ulcer disease    Stress incontinence    Past Surgical History:  Procedure Laterality Date   ABDOMINAL SURGERY     Per new patient form   BLADDER SURGERY     DG  BONE DENSITY ([place] HX)     Per new patient form   EYE SURGERY     KNEE ARTHROPLASTY     LEFT HEART CATH AND CORONARY ANGIOGRAPHY N/A 10/22/2019   Procedure: LEFT HEART CATH AND CORONARY ANGIOGRAPHY;  Surgeon: Sherren Mocha, MD;  Location: [facility];  Service: Cardiovascular;  Laterality: N/A;   LEG SURGERY Right    multiple surgeries, unknown type   PACEMAKER IMPLANT N/A 06/04/2018   Procedure: PACEMAKER IMPLANT;  Surgeon: Deboraha Sprang, MD;  Location: [facility]  INVASIVE [facility];  Service: Cardiovascular;  Laterality: N/A;    Allergies  Allergen Reactions   Donepezil Nausea And Vomiting and Other (See Comments)    Severe vomiting   Ciprofloxacin Other (See Comments)    Reaction not recalled   Latex Other (See Comments)    Reaction not recalled   Levofloxacin Other (See Comments)    Reaction not recalled   Macrobid [Nitrofurantoin Monohyd Macro] Other (See Comments)    Reaction not recalled   Nitrofurantoin Other (See Comments)    Reaction not recalled   Sulfa Antibiotics Other (See Comments)    Reaction not recalled   Sulfamethoxazole     Reaction not recalled    Outpatient Encounter Medications as of  06/15/2022  Medication Sig   acetaminophen (TYLENOL) 325 MG tablet Take 650 mg by mouth as needed.   apixaban (ELIQUIS) 5 MG TABS tablet Take 5 mg by mouth 2 (two) times daily.   atenolol (TENORMIN) 25 MG tablet Take 25 mg by mouth daily.    cephALEXin (KEFLEX) 250 MG capsule Take 250 mg by mouth daily.   Cholecalciferol (VITAMIN D3) 125 MCG (5000 UT) TABS Take 1 tablet by mouth daily.   doxycycline (VIBRA-TABS) 100 MG tablet Take 100 mg by mouth 2 (two) times daily. Taking for 10 days   FLUoxetine (PROZAC) 20 MG capsule Take 20 mg by mouth at bedtime.   FLUoxetine (PROZAC) 40 MG capsule Take 40 mg by mouth in the morning.   furosemide (LASIX) 20 MG tablet Take 20 mg by mouth daily. Mon, Wed, Fri, Sat   levothyroxine (SYNTHROID) 88 MCG tablet Take 88 mcg by mouth daily.   losartan (COZAAR) 50 MG tablet Take 25 mg by mouth daily.   Omega-3 Fatty Acids (OMEGA-3 FISH OIL) 1200 MG CAPS Take 2 capsules by mouth daily.   omeprazole (PRILOSEC OTC) 20 MG tablet Take 20 mg by mouth daily.   Polyethyl Glycol-Propyl Glycol (SYSTANE) 0.4-0.3 % GEL ophthalmic gel Place 1 application into both eyes at bedtime.   rosuvastatin (CRESTOR) 20 MG tablet TAKE ONE TABLET EVERY EVENING   saccharomyces boulardii (FLORASTOR) 250 MG capsule Take 250 mg by mouth 2 (two) times daily. Taking for 13 days   traMADol (ULTRAM) 50 MG tablet Take 1 tablet (50 mg total) by mouth daily.   No facility-administered encounter medications on file as of 06/15/2022.    Review of Systems  Constitutional:  Negative for appetite change, chills, fatigue and fever.  HENT:  Negative for congestion, hearing loss, rhinorrhea and sore throat.   Eyes: Negative.   Respiratory:  Negative for cough, shortness of breath and wheezing.   Cardiovascular:  Negative for chest pain, palpitations and leg swelling.  Gastrointestinal:  Negative for abdominal pain, constipation, diarrhea, nausea and vomiting.  Genitourinary:  Negative for dysuria.   Musculoskeletal:  Negative for arthralgias, back pain and myalgias.  Skin:  Negative for color change, rash and wound.  Neurological:  Negative for dizziness, weakness and headaches.  Psychiatric/Behavioral:  Negative for behavioral problems. The patient is not nervous/anxious.        Immunization History  Administered Date(s) Administered   Influenza Split 09/13/2017, 09/06/2018, 09/18/2019   Influenza, High Dose Seasonal PF 09/04/2021   Moderna SARS-COV2 Booster Vaccination 10/13/2020   PFIZER(Purple Top)SARS-COV-2 Vaccination 12/16/2019, 01/16/2020   PNEUMOCOCCAL CONJUGATE-20 12/23/2021   Pertinent  Health Maintenance Due  Topic Date Due   INFLUENZA VACCINE  07/05/2022   DEXA SCAN  Completed      10/20/2019  9:20 PM 10/21/2019    7:49 AM 10/21/2019    9:20 PM 10/22/2019    9:00 AM 12/08/2021   11:10 AM  Fall Risk  Patient Fall Risk Level Moderate fall risk Moderate fall risk Moderate fall risk Moderate fall risk Low fall risk     Vitals:   06/15/22 0948  BP: 140/80  Pulse: 61  Resp: 16  Temp: 98.3 F (36.8 C)  SpO2: 96%  Weight: 196 lb 3.2 oz (89 kg)  Height: '5\' 8"'$  (1.727 m)   Body mass index is 29.83 kg/m.  Physical Exam Constitutional:      General: She is not in acute distress.    Appearance: Normal appearance.  HENT:     Head: Normocephalic and atraumatic.     Nose: Nose normal.     Mouth/Throat:     Mouth: Mucous membranes are moist.  Eyes:     Conjunctiva/sclera: Conjunctivae normal.  Cardiovascular:     Rate and Rhythm: Normal rate. Rhythm irregular.  Pulmonary:     Effort: Pulmonary effort is normal.     Breath sounds: Normal breath sounds.  Abdominal:     General: Bowel sounds are normal.     Palpations: Abdomen is soft.  Musculoskeletal:        General: Swelling present. Normal range of motion.     Cervical back: Normal range of motion.     Comments: BLE 2+edema.  Skin:    General: Skin is warm and dry.  Neurological:      General: No focal deficit present.     Mental Status: She is alert and oriented to person, place, and time.  Psychiatric:        Mood and Affect: Mood normal.        Behavior: Behavior normal.        Thought Content: Thought content normal.        Judgment: Judgment normal.        Labs reviewed: Recent Labs    12/08/21 1122 12/10/21 1641 02/14/22 1628 05/26/22 1610  NA 136 141 140 138  K 3.6 3.7 4.5 3.6  CL 107 103 102 106  CO2 '22 23 21 20  '$ GLUCOSE 102* 105* 102*  --   BUN 21 24 33* 27*  CREATININE 1.01* 1.17* 1.26* 1.1  CALCIUM 8.9 9.6 9.6 8.9   Recent Labs    12/08/21 1630  AST 24  ALT 24  ALKPHOS 52  BILITOT 0.6  PROT 6.0*  ALBUMIN 3.5   Recent Labs    12/08/21 1122 02/14/22 1628 05/26/22 1610  WBC 6.6 7.3 11.6  NEUTROABS  --   --  10,463.00  HGB 11.6* 10.8* 9.6*  HCT 35.7* 33.3* 31*  MCV 89.5 89  --   PLT 223 233  --    Lab Results  Component Value Date   TSH 0.23 (A) 03/31/2022   No results found for: "HGBA1C" Lab Results  Component Value Date   CHOL 138 02/08/2022   HDL 71 (A) 02/08/2022   LDLCALC 55 02/08/2022   TRIG 42 02/08/2022    Significant Diagnostic Results in last 30 days:  CUP PACEART REMOTE DEVICE CHECK  Result Date: 06/11/2022 Scheduled remote reviewed. Normal device function.  AF burden 45%, becoming more persistent per trends. On [place] Next remote 91 days.   Assessment/Plan  1. Lower extremity edema -  will add another day to take Lasix 20 mg daily on Monday, Tuesday, Wednesday, Thursday Friday and Saturday   2. Primary  hypertension -  -Blood pressure well controlled Continue current medication  3. Stage 3a chronic kidney disease (HCC) -  GFR 46, BUN 27 and creatinine 1.14 -  will re-check     Family/ staff Communication: Discussed plan of care with resident and charge nurse.  Labs/tests ordered:   BMP    Durenda Age, DNP, MSN, FNP-BC [facility] and Adult Medicine [phone]  (Monday-Friday 8:00 a.m. - 5:00 p.m.) [phone] (after hours)

## 2022-06-16 LAB — BASIC METABOLIC PANEL
BUN: 16 (ref 4–21)
CO2: 27 — AB (ref 13–22)
Chloride: 107 (ref 99–108)
Creatinine: 0.9 (ref 0.5–1.1)
Glucose: 87
Potassium: 4 mEq/L (ref 3.5–5.1)
Sodium: 140 (ref 137–147)

## 2022-06-16 LAB — COMPREHENSIVE METABOLIC PANEL
Calcium: 8.3 — AB (ref 8.7–10.7)
eGFR: 58

## 2022-06-22 ENCOUNTER — Other Ambulatory Visit: Payer: Self-pay

## 2022-06-22 ENCOUNTER — Emergency Department (HOSPITAL_COMMUNITY): Payer: Medicare Other

## 2022-06-22 ENCOUNTER — Inpatient Hospital Stay (HOSPITAL_COMMUNITY)
Admission: EM | Admit: 2022-06-22 | Discharge: 2022-06-25 | DRG: 291 | Disposition: A | Discharge status: Home Health | Payer: Medicare Other | Attending: Internal Medicine | Admitting: Internal Medicine

## 2022-06-22 ENCOUNTER — Encounter (HOSPITAL_COMMUNITY): Payer: Self-pay | Admitting: Internal Medicine

## 2022-06-22 DIAGNOSIS — M1990 Unspecified osteoarthritis, unspecified site: Secondary | ICD-10-CM | POA: Diagnosis present

## 2022-06-22 DIAGNOSIS — Z882 Allergy status to sulfonamides status: Secondary | ICD-10-CM

## 2022-06-22 DIAGNOSIS — Z806 Family history of leukemia: Secondary | ICD-10-CM

## 2022-06-22 DIAGNOSIS — I4891 Unspecified atrial fibrillation: Secondary | ICD-10-CM | POA: Diagnosis present

## 2022-06-22 DIAGNOSIS — Z8711 Personal history of peptic ulcer disease: Secondary | ICD-10-CM

## 2022-06-22 DIAGNOSIS — I5033 Acute on chronic diastolic (congestive) heart failure: Secondary | ICD-10-CM | POA: Diagnosis present

## 2022-06-22 DIAGNOSIS — R079 Chest pain, unspecified: Principal | ICD-10-CM | POA: Diagnosis present

## 2022-06-22 DIAGNOSIS — I130 Hypertensive heart and chronic kidney disease with heart failure and stage 1 through stage 4 chronic kidney disease, or unspecified chronic kidney disease: Secondary | ICD-10-CM | POA: Diagnosis not present

## 2022-06-22 DIAGNOSIS — Z8261 Family history of arthritis: Secondary | ICD-10-CM

## 2022-06-22 DIAGNOSIS — Z950 Presence of cardiac pacemaker: Secondary | ICD-10-CM

## 2022-06-22 DIAGNOSIS — E785 Hyperlipidemia, unspecified: Secondary | ICD-10-CM | POA: Diagnosis present

## 2022-06-22 DIAGNOSIS — N183 Chronic kidney disease, stage 3 unspecified: Secondary | ICD-10-CM | POA: Diagnosis present

## 2022-06-22 DIAGNOSIS — Z8249 Family history of ischemic heart disease and other diseases of the circulatory system: Secondary | ICD-10-CM

## 2022-06-22 DIAGNOSIS — N1831 Chronic kidney disease, stage 3a: Secondary | ICD-10-CM | POA: Diagnosis present

## 2022-06-22 DIAGNOSIS — F339 Major depressive disorder, recurrent, unspecified: Secondary | ICD-10-CM | POA: Diagnosis present

## 2022-06-22 DIAGNOSIS — N393 Stress incontinence (female) (male): Secondary | ICD-10-CM | POA: Diagnosis present

## 2022-06-22 DIAGNOSIS — E039 Hypothyroidism, unspecified: Secondary | ICD-10-CM | POA: Diagnosis present

## 2022-06-22 DIAGNOSIS — K219 Gastro-esophageal reflux disease without esophagitis: Secondary | ICD-10-CM | POA: Diagnosis present

## 2022-06-22 DIAGNOSIS — I10 Essential (primary) hypertension: Secondary | ICD-10-CM | POA: Diagnosis present

## 2022-06-22 DIAGNOSIS — D631 Anemia in chronic kidney disease: Secondary | ICD-10-CM | POA: Diagnosis present

## 2022-06-22 DIAGNOSIS — Z66 Do not resuscitate: Secondary | ICD-10-CM | POA: Diagnosis present

## 2022-06-22 DIAGNOSIS — I480 Paroxysmal atrial fibrillation: Secondary | ICD-10-CM | POA: Diagnosis present

## 2022-06-22 DIAGNOSIS — Z79890 Hormone replacement therapy (postmenopausal): Secondary | ICD-10-CM

## 2022-06-22 DIAGNOSIS — Z91040 Latex allergy status: Secondary | ICD-10-CM

## 2022-06-22 DIAGNOSIS — I351 Nonrheumatic aortic (valve) insufficiency: Secondary | ICD-10-CM | POA: Diagnosis present

## 2022-06-22 DIAGNOSIS — J9811 Atelectasis: Secondary | ICD-10-CM | POA: Diagnosis present

## 2022-06-22 DIAGNOSIS — M810 Age-related osteoporosis without current pathological fracture: Secondary | ICD-10-CM | POA: Diagnosis present

## 2022-06-22 DIAGNOSIS — Z96659 Presence of unspecified artificial knee joint: Secondary | ICD-10-CM | POA: Diagnosis present

## 2022-06-22 DIAGNOSIS — Z881 Allergy status to other antibiotic agents status: Secondary | ICD-10-CM

## 2022-06-22 DIAGNOSIS — Z79899 Other long term (current) drug therapy: Secondary | ICD-10-CM

## 2022-06-22 DIAGNOSIS — Z7901 Long term (current) use of anticoagulants: Secondary | ICD-10-CM

## 2022-06-22 DIAGNOSIS — R4189 Other symptoms and signs involving cognitive functions and awareness: Secondary | ICD-10-CM | POA: Diagnosis present

## 2022-06-22 LAB — COMPREHENSIVE METABOLIC PANEL
ALT: 26 U/L (ref 0–44)
AST: 27 U/L (ref 15–41)
Albumin: 3.4 g/dL — ABNORMAL LOW (ref 3.5–5.0)
Alkaline Phosphatase: 56 U/L (ref 38–126)
Anion gap: 11 (ref 5–15)
BUN: 21 mg/dL (ref 8–23)
CO2: 24 mmol/L (ref 22–32)
Calcium: 8.7 mg/dL — ABNORMAL LOW (ref 8.9–10.3)
Chloride: 102 mmol/L (ref 98–111)
Creatinine, Ser: 1.14 mg/dL — ABNORMAL HIGH (ref 0.44–1.00)
GFR, Estimated: 46 mL/min — ABNORMAL LOW (ref >60)
Glucose, Bld: 141 mg/dL — ABNORMAL HIGH (ref 70–99)
Potassium: 3.6 mmol/L (ref 3.5–5.1)
Sodium: 137 mmol/L (ref 135–145)
Total Bilirubin: 0.4 mg/dL (ref 0.3–1.2)
Total Protein: 6.1 g/dL — ABNORMAL LOW (ref 6.5–8.1)

## 2022-06-22 LAB — CBC WITH DIFFERENTIAL/PLATELET
Abs Immature Granulocytes: 0.04 10*3/uL (ref 0.00–0.07)
Basophils Absolute: 0 10*3/uL (ref 0.0–0.1)
Basophils Relative: 0 %
Eosinophils Absolute: 0 10*3/uL (ref 0.0–0.5)
Eosinophils Relative: 0 %
HCT: 29.2 % — ABNORMAL LOW (ref 36.0–46.0)
Hemoglobin: 9.5 g/dL — ABNORMAL LOW (ref 12.0–15.0)
Immature Granulocytes: 1 %
Lymphocytes Relative: 4 %
Lymphs Abs: 0.4 10*3/uL — ABNORMAL LOW (ref 0.7–4.0)
MCH: 27.5 pg (ref 26.0–34.0)
MCHC: 32.5 g/dL (ref 30.0–36.0)
MCV: 84.6 fL (ref 80.0–100.0)
Monocytes Absolute: 0.8 10*3/uL (ref 0.1–1.0)
Monocytes Relative: 9 %
Neutro Abs: 7.2 10*3/uL (ref 1.7–7.7)
Neutrophils Relative %: 86 %
Platelets: 208 10*3/uL (ref 150–400)
RBC: 3.45 MIL/uL — ABNORMAL LOW (ref 3.87–5.11)
RDW: 16.3 % — ABNORMAL HIGH (ref 11.5–15.5)
WBC: 8.4 10*3/uL (ref 4.0–10.5)
nRBC: 0 % (ref 0.0–0.2)

## 2022-06-22 LAB — MAGNESIUM: Magnesium: 1.6 mg/dL — ABNORMAL LOW (ref 1.7–2.4)

## 2022-06-22 LAB — TROPONIN I (HIGH SENSITIVITY)
Troponin I (High Sensitivity): 9 ng/L (ref <18)
Troponin I (High Sensitivity): 9 ng/L (ref <18)

## 2022-06-22 LAB — LIPASE, BLOOD: Lipase: 37 U/L (ref 11–51)

## 2022-06-22 LAB — BRAIN NATRIURETIC PEPTIDE: B Natriuretic Peptide: 793.1 pg/mL — ABNORMAL HIGH (ref 0.0–100.0)

## 2022-06-22 MED ORDER — ROSUVASTATIN CALCIUM 20 MG PO TABS
20.0000 mg | ORAL_TABLET | Freq: Every day | ORAL | Status: DC
  Administered 2022-06-22 – 2022-06-25 (×4): 20 mg via ORAL
  Filled 2022-06-22 (×4): qty 1

## 2022-06-22 MED ORDER — LEVOTHYROXINE SODIUM 88 MCG PO TABS
88.0000 ug | ORAL_TABLET | Freq: Every day | ORAL | Status: DC
Start: 2022-06-22 — End: 2022-06-25
  Administered 2022-06-23 – 2022-06-25 (×3): 88 ug via ORAL
  Filled 2022-06-22 (×3): qty 1

## 2022-06-22 MED ORDER — APIXABAN 5 MG PO TABS
5.0000 mg | ORAL_TABLET | Freq: Two times a day (BID) | ORAL | Status: DC
  Administered 2022-06-22 – 2022-06-25 (×6): 5 mg via ORAL
  Filled 2022-06-22 (×6): qty 1

## 2022-06-22 MED ORDER — FUROSEMIDE 10 MG/ML IJ SOLN
20.0000 mg | Freq: Once | INTRAMUSCULAR | Status: AC
Start: 2022-06-22 — End: 2022-06-22
  Administered 2022-06-22: 20 mg via INTRAVENOUS
  Filled 2022-06-22: qty 2

## 2022-06-22 MED ORDER — MAGNESIUM SULFATE IN D5W 1-5 GM/100ML-% IV SOLN
1.0000 g | Freq: Once | INTRAVENOUS | Status: AC
  Administered 2022-06-22: 1 g via INTRAVENOUS
  Filled 2022-06-22: qty 100

## 2022-06-22 MED ORDER — ARTIFICIAL TEARS OPHTHALMIC OINT
1.0000 | TOPICAL_OINTMENT | Freq: Every day | OPHTHALMIC | Status: DC
Start: 2022-06-22 — End: 2022-06-25
  Administered 2022-06-22 – 2022-06-24 (×3): 1 via OPHTHALMIC
  Filled 2022-06-22: qty 3.5

## 2022-06-22 MED ORDER — ONDANSETRON HCL 4 MG/2ML IJ SOLN
4.0000 mg | Freq: Once | INTRAMUSCULAR | Status: AC
  Administered 2022-06-22: 4 mg via INTRAVENOUS
  Filled 2022-06-22: qty 2

## 2022-06-22 MED ORDER — ALUM & MAG HYDROXIDE-SIMETH 200-200-20 MG/5ML PO SUSP
30.0000 mL | Freq: Once | ORAL | Status: AC
  Administered 2022-06-22: 30 mL via ORAL
  Filled 2022-06-22: qty 30

## 2022-06-22 MED ORDER — PANTOPRAZOLE SODIUM 40 MG PO TBEC
40.0000 mg | DELAYED_RELEASE_TABLET | Freq: Every day | ORAL | Status: DC
  Administered 2022-06-22 – 2022-06-25 (×4): 40 mg via ORAL
  Filled 2022-06-22 (×4): qty 1

## 2022-06-22 MED ORDER — ATENOLOL 25 MG PO TABS
25.0000 mg | ORAL_TABLET | Freq: Once | ORAL | Status: AC
  Administered 2022-06-22: 25 mg via ORAL
  Filled 2022-06-22: qty 1

## 2022-06-22 MED ORDER — ONDANSETRON HCL 4 MG/2ML IJ SOLN
4.0000 mg | Freq: Four times a day (QID) | INTRAMUSCULAR | Status: DC | PRN
  Administered 2022-06-22: 4 mg via INTRAVENOUS
  Filled 2022-06-22: qty 2

## 2022-06-22 MED ORDER — ATENOLOL 25 MG PO TABS
25.0000 mg | ORAL_TABLET | Freq: Every day | ORAL | Status: DC
  Administered 2022-06-22 – 2022-06-25 (×4): 25 mg via ORAL
  Filled 2022-06-22 (×5): qty 1

## 2022-06-22 MED ORDER — FLUOXETINE HCL 20 MG PO CAPS
20.0000 mg | ORAL_CAPSULE | Freq: Every day | ORAL | Status: DC
  Administered 2022-06-23 – 2022-06-25 (×3): 20 mg via ORAL
  Filled 2022-06-22 (×3): qty 1

## 2022-06-22 MED ORDER — FLUOXETINE HCL 20 MG PO CAPS
40.0000 mg | ORAL_CAPSULE | Freq: Every morning | ORAL | Status: DC
  Administered 2022-06-23 – 2022-06-24 (×2): 40 mg via ORAL
  Filled 2022-06-22 (×2): qty 2

## 2022-06-22 MED ORDER — NITROGLYCERIN 2 % TD OINT
1.0000 [in_us] | TOPICAL_OINTMENT | Freq: Once | TRANSDERMAL | Status: AC
  Administered 2022-06-22: 1 [in_us] via TOPICAL
  Filled 2022-06-22: qty 1

## 2022-06-22 MED ORDER — METOPROLOL TARTRATE 5 MG/5ML IV SOLN
10.0000 mg | Freq: Once | INTRAVENOUS | Status: AC
Start: 2022-06-22 — End: 2022-06-22
  Administered 2022-06-22: 10 mg via INTRAVENOUS
  Filled 2022-06-22: qty 10

## 2022-06-22 MED ORDER — TRAMADOL HCL 50 MG PO TABS
50.0000 mg | ORAL_TABLET | Freq: Two times a day (BID) | ORAL | Status: DC | PRN
  Administered 2022-06-23 – 2022-06-24 (×3): 50 mg via ORAL
  Filled 2022-06-22 (×3): qty 1

## 2022-06-22 MED ORDER — MORPHINE SULFATE (PF) 2 MG/ML IV SOLN
2.0000 mg | Freq: Once | INTRAVENOUS | Status: AC
  Administered 2022-06-22: 2 mg via INTRAVENOUS
  Filled 2022-06-22: qty 1

## 2022-06-22 MED ORDER — ACETAMINOPHEN 325 MG PO TABS: 650.0000 mg | ORAL_TABLET | ORAL | Status: DC | PRN

## 2022-06-22 MED ORDER — LOSARTAN POTASSIUM 25 MG PO TABS
37.5000 mg | ORAL_TABLET | Freq: Every day | ORAL | Status: DC
  Administered 2022-06-22 – 2022-06-23 (×2): 37.5 mg via ORAL
  Filled 2022-06-22: qty 2
  Filled 2022-06-22: qty 1.5
  Filled 2022-06-22: qty 2

## 2022-06-22 NOTE — ED Triage Notes (Signed)
Pt arrived via [facility] from [place] at [place] with c/c of Chest Pain and N/V. Per EMS chest pain described as left sided since midnight 10/10 with no radiation. Pt presents with lower extremities edema with no CHF hx. Pt presents with pacemaker. Rales both lower lobes.    '4mg'$  zofran.  64-120HR, 94% RA, 118/70 --> 170/80, CBG 138

## 2022-06-22 NOTE — ED Provider Notes (Signed)
88 yo F with a chief complaints of chest pain.  The patient states has been going on for couple days now.  Describes it as sharp and severe.  Nothing seems to make it better or worse.  She feels a little bit better than she did earlier in the evening on my assessment.  I received the patient in signout from Dr. Dina Rich.  Based on ED eval most likely diagnosis was fluid overload.  Plan for repeat assessment post diuresis and second troponin.  Second troponin is resulted and is unchanged.  She was given her home dose of atenolol with some improvement of her ventricular rate.  Unfortunately the patient is still describing some significant chest discomfort.  My calculation of her heart score is a 5.  Will discuss with medicine for possible observation.     Deno Etienne, DO 06/22/22 1129

## 2022-06-22 NOTE — ED Notes (Signed)
Tolerated peanut butter crackers and ginger ale. Pt is requesting food at this time. Will notify MD for diet order.

## 2022-06-22 NOTE — ED Provider Notes (Signed)
[facility] EMERGENCY DEPARTMENT Provider Note   CSN: [id number] Arrival date & time: 06/22/22  0415     History  Chief Complaint  Patient presents with   Chest Pain   Emesis    Faith Parker is a 88 y.o. female.  HPI     This is a 88 year old female with a history of atrial fibrillation who presents with chest pain and nausea and vomiting.  Patient reports that she was sitting in her recliner when she had onset of chest discomfort and nausea and vomiting.  This was around midnight.  Pain is in the anterior chest and epigastrium.  There is no radiation.  Patient describes "coughing up mucus" frequently as well.  Denies fevers.  Denies smoking history.  Does report some lower extremity swelling but no history of CHF.  Cardiac cath in 2020 with minimal nonobstructive coronary artery disease.  Last EF was normal.  Patient had similar ED presentation in January.  At that time was noted to be volume overloaded.  Troponins negative.  She was discharged with increased Lasix.  Home Medications Prior to Admission medications   Medication Sig Start Date End Date Taking? Authorizing Provider  acetaminophen (TYLENOL) 325 MG tablet Take 650 mg by mouth as needed.    [provider]  apixaban (ELIQUIS) 5 MG TABS tablet Take 5 mg by mouth 2 (two) times daily.    [provider]  atenolol (TENORMIN) 25 MG tablet Take 25 mg by mouth daily.     [provider]  cephALEXin (KEFLEX) 250 MG capsule Take 250 mg by mouth daily.    [provider]  Cholecalciferol (VITAMIN D3) 125 MCG (5000 UT) TABS Take 1 tablet by mouth daily.    [provider]  doxycycline (VIBRA-TABS) 100 MG tablet Take 100 mg by mouth 2 (two) times daily. Taking for 10 days    [provider]  FLUoxetine (PROZAC) 20 MG capsule Take 20 mg by mouth at bedtime.    [provider]  FLUoxetine (PROZAC) 40 MG capsule Take 40 mg by mouth in the morning.  12/14/20   [provider]  furosemide (LASIX) 20 MG tablet Take 20 mg by mouth daily. Mon, Wed, Fri, Sat    [provider]  levothyroxine (SYNTHROID) 88 MCG tablet Take 88 mcg by mouth daily.    [provider]  losartan (COZAAR) 50 MG tablet Take 25 mg by mouth daily. 11/09/20   [provider]  Omega-3 Fatty Acids (OMEGA-3 FISH OIL) 1200 MG CAPS Take 2 capsules by mouth daily.    [provider]  omeprazole (PRILOSEC OTC) 20 MG tablet Take 20 mg by mouth daily.    [provider]  Polyethyl Glycol-Propyl Glycol (SYSTANE) 0.4-0.3 % GEL ophthalmic gel Place 1 application into both eyes at bedtime.    [provider]  rosuvastatin (CRESTOR) 20 MG tablet TAKE ONE TABLET EVERY EVENING 12/10/21   Fay Records, MD  saccharomyces boulardii (FLORASTOR) 250 MG capsule Take 250 mg by mouth 2 (two) times daily. Taking for 13 days    [provider]  traMADol (ULTRAM) 50 MG tablet Take 1 tablet (50 mg total) by mouth daily. 04/11/22   Medina-Vargas, Monina C, NP      Allergies    Donepezil, Ciprofloxacin, Latex, Levofloxacin, Macrobid [nitrofurantoin monohyd macro], Nitrofurantoin, Sulfa antibiotics, and Sulfamethoxazole    Review of Systems   Review of Systems  Constitutional:  Negative for fever.  Respiratory:  Positive for cough and shortness of breath.   Cardiovascular:  Positive for chest pain and leg swelling.  All other systems reviewed and are negative.   Physical Exam Updated Vital Signs BP 126/76   Pulse (!) 123   Temp 99.2 F (37.3 C) (Oral)   Resp 15   Ht 1.727 m ('5\' 8"'$ )   Wt 89 kg   SpO2 93%   BMI 29.83 kg/m  Physical Exam Vitals and nursing note reviewed.  Constitutional:      Appearance: She is well-developed. She is not ill-appearing.  HENT:     Head: Normocephalic and atraumatic.  Eyes:     Pupils: Pupils are equal, round, and reactive to light.  Cardiovascular:     Rate and Rhythm: Tachycardia  present. Rhythm irregular.     Heart sounds: Normal heart sounds.  Pulmonary:     Effort: Pulmonary effort is normal. No respiratory distress.     Breath sounds: Examination of the right-lower field reveals rales. Examination of the left-lower field reveals rales. Rales present. No wheezing.  Abdominal:     General: Bowel sounds are normal.     Palpations: Abdomen is soft.  Musculoskeletal:     Cervical back: Neck supple.     Right lower leg: Edema present.     Left lower leg: Edema present.     Comments: 1+ pitting edema bilaterally  Skin:    General: Skin is warm and dry.  Neurological:     Mental Status: She is alert and oriented to person, place, and time.  Psychiatric:        Mood and Affect: Mood normal.     ED Results / Procedures / Treatments   Labs (all labs ordered are listed, but only abnormal results are displayed) Labs Reviewed  CBC WITH DIFFERENTIAL/PLATELET - Abnormal; Notable for the following components:      Result Value   RBC 3.45 (*)    Hemoglobin 9.5 (*)    HCT 29.2 (*)    RDW 16.3 (*)    Lymphs Abs 0.4 (*)    All other components within normal limits  COMPREHENSIVE METABOLIC PANEL - Abnormal; Notable for the following components:   Glucose, Bld 141 (*)    Creatinine, Ser 1.14 (*)    Calcium 8.7 (*)    Total Protein 6.1 (*)    Albumin 3.4 (*)    GFR, Estimated 46 (*)    All other components within normal limits  MAGNESIUM - Abnormal; Notable for the following components:   Magnesium 1.6 (*)    All other components within normal limits  LIPASE, BLOOD  BRAIN NATRIURETIC PEPTIDE  TROPONIN I (HIGH SENSITIVITY)    EKG EKG Interpretation  Date/Time:  Wednesday June 22 2022 04:26:13 EDT Ventricular Rate:  104 PR Interval:    QRS Duration: 87 QT Interval:  358 QTC Calculation: 471 R Axis:   -10 Text Interpretation: Atrial fibrillation RSR' in V1 or V2, probably normal variant Minimal ST depression, inferior leads Now in atrial fibrillation  Confirmed by Thayer Jew [phone]) on 06/22/2022 4:28:01 AM  Radiology DG Chest Portable 1 View  Result Date: 06/22/2022 CLINICAL DATA:  Chest pain. EXAM: PORTABLE CHEST 1 VIEW COMPARISON:  12/08/2018. FINDINGS: Heart is enlarged and the mediastinal contour is stable. Atherosclerotic calcification of the aorta is noted. A dual lead pacemaker is present over the left chest. Increased atelectasis or infiltrate is noted at the lung bases, greater on the left than on the right. There is a  small left pleural effusion. No pneumothorax. No acute osseous abnormality. IMPRESSION: 1. Increased atelectasis or infiltrate at the lung bases, greater on the left than on the right. 2. Small left pleural effusion. Electronically Signed   By: Brett Fairy M.D.   On: 06/22/2022 04:50    Procedures .Critical Care  Performed by: Merryl Hacker, MD Authorized by: Merryl Hacker, MD   Critical care provider statement:    Critical care time (minutes):  31   Critical care was necessary to treat or prevent imminent or life-threatening deterioration of the following conditions:  Cardiac failure   Critical care was time spent personally by me on the following activities:  Development of treatment plan with patient or surrogate, discussions with consultants, evaluation of patient's response to treatment, examination of patient, ordering and review of laboratory studies, ordering and review of radiographic studies, ordering and performing treatments and interventions, pulse oximetry, re-evaluation of patient's condition and review of old charts     Medications Ordered in ED Medications  ondansetron (ZOFRAN) injection 4 mg (4 mg Intravenous Given 06/22/22 0440)    ED Course/ Medical Decision Making/ A&P                           Medical Decision Making Amount and/or Complexity of Data Reviewed Labs: ordered. Radiology: ordered.  Risk Prescription drug management.   This patient presents to the ED for  concern of chest discomfort, nausea, vomit, this involves an extensive number of treatment options, and is a complaint that carries with it a high risk of complications and morbidity.  I considered the following differential and admission for this acute, potentially life threatening condition.  The differential diagnosis includes ACS, CHF, less likely PE, pancreatitis, gastritis, gastroenteritis, pneumonia  MDM:    This is a 88 year old female who presents with chest discomfort, cough, nausea, vomiting.  She is nontoxic and vital signs are largely reassuring.  She is in no respiratory distress.  Appears slightly volume overloaded on exam with 1+ bilateral lower extremity edema.  EKG shows no evidence of acute ischemia.  Initial lab work obtained.  BNP elevated at 793.  Initial troponin negative.  Mag 1.6.  Replaced.  She is in atrial fibrillation with variable rates in the low 100s.  She was given 1 dose of metoprolol.  X-ray consistent with increased atelectasis or infiltrate in the lung bases greater on the right than the left and a pleural effusion.  Clinically, less suspicious for pneumonia and more suspicion for volume overload.  She was given 20 mg of IV Lasix.  (Labs, imaging, consults)  Labs: I Ordered, and personally interpreted labs.  The pertinent results include: CBC, BMP, BNP  Imaging Studies ordered: I ordered imaging studies including chest x-ray I independently visualized and interpreted imaging. I agree with the radiologist interpretation  Additional history obtained from chart review.  External records from outside source obtained and reviewed including prior cardiac evaluations  Cardiac Monitoring: The patient was maintained on a cardiac monitor.  I personally viewed and interpreted the cardiac monitored which showed an underlying rhythm of: Atrial fibrillation  Reevaluation: After the interventions noted above, I reevaluated the patient and found that they have  :improved  Social Determinants of Health: Lives in assisted living  Disposition: Pending  Co morbidities that complicate the patient evaluation  Past Medical History:  Diagnosis Date   Anxiety    Aortic regurgitation    Atrial fibrillation ([place])  Chest pain    Per new patient form   Gastritis    GERD (gastroesophageal reflux disease)    H/O artificial eye lens    Hypertension    Hypothyroid    Left ventricular hypertrophy    Major depression    Osteoarthritis    Osteoporosis    Pacemaker    On Eliquis, Per new patient form   Peptic ulcer disease    Stress incontinence      Medicines Meds ordered this encounter  Medications   ondansetron (ZOFRAN) injection 4 mg   metoprolol tartrate (LOPRESSOR) injection 10 mg   furosemide (LASIX) injection 20 mg    I have reviewed the patients home medicines and have made adjustments as needed  Problem List / ED Course: Problem List Items Addressed This Visit   None               Final Clinical Impression(s) / ED Diagnoses Final diagnoses:  None    Rx / DC Orders ED Discharge Orders     None         Merryl Hacker, MD 06/22/22 [phone]

## 2022-06-22 NOTE — ED Notes (Signed)
Ate a few bites of [place] sandwich an hour ago. Pt started vomiting at this time. Zofran IVP given. Will continue to monitor.

## 2022-06-22 NOTE — H&P (Signed)
History and Physical    Patient: Faith Parker FMB:[id number] DOB: 1930-02-23 DOA: 06/22/2022 DOS: the patient was seen and examined on 06/22/2022 PCP: Mast, Man X, NP  Patient coming from: [facility] [place]; NOK: Daughter, Ivin Booty, [phone]   Chief Complaint: chest pain  HPI: Faith Parker is a 88 y.o. female with medical history significant of afib; HTN; hypothyroidism; and pacemaker placement presenting with chest pain. She presented yesterday with terrible CP.  Substernal to her abdomen, like "there was a saw" cutting its way from her throat to her umbilicus.  She was also vomiting with chest pressure and couldn't think straight.  The pain started yesterday afternoon, non-exertional.  She ate a polish sausage yesterday.  She was having sharp pain and trouble breathing.  The chest pressure is still ongoing.  She still feels somewhat SOB, but she is "always SOB."  She has had afib in the past and it feels somewhat similar to that.  She has had volume overload in January and was given Lasix x 4 days.  She is on Lasix at home and they have increased her Lasix and she also has followed with Dr. Harrington Challenger for these type issues.  Swelling has recently been worse, keeps her feet elevated.  Her energy level is not great.  She is able to participate in sedentary events, doesn't stand long and walks short distances with a walker.  She has been more sedentary since moving to ALF in January.    ER Course:   chest pain for a few days.  Seen overnight, ?fluid overload.  Given Lasix.  Afib up to 130s, given home atenolol.  Still with chest discomfort.  Reasonable to observe overnight.     Review of Systems: As mentioned in the history of present illness. All other systems reviewed and are negative. Past Medical History:  Diagnosis Date   Anxiety    Aortic regurgitation    Atrial fibrillation (HCC)    Chest pain    Per new patient form   Gastritis    GERD (gastroesophageal reflux disease)     H/O artificial eye lens    Hypertension    Hypothyroid    Left ventricular hypertrophy    Major depression    Osteoarthritis    Osteoporosis    Pacemaker    On Eliquis, Per new patient form   Peptic ulcer disease    Stress incontinence    Past Surgical History:  Procedure Laterality Date   ABDOMINAL SURGERY     Per new patient form   BLADDER SURGERY     DG  BONE DENSITY ([place] HX)     Per new patient form   EYE SURGERY     KNEE ARTHROPLASTY     LEFT HEART CATH AND CORONARY ANGIOGRAPHY N/A 10/22/2019   Procedure: LEFT HEART CATH AND CORONARY ANGIOGRAPHY;  Surgeon: Sherren Mocha, MD;  Location: [place] [facility];  Service: Cardiovascular;  Laterality: N/A;   LEG SURGERY Right    multiple surgeries, unknown type   PACEMAKER IMPLANT N/A 06/04/2018   Procedure: PACEMAKER IMPLANT;  Surgeon: Deboraha Sprang, MD;  Location: [place] [facility];  Service: Cardiovascular;  Laterality: N/A;   Social History:  reports that she has never smoked. She has never used smokeless tobacco. She reports that she does not drink alcohol and does not use drugs.  Allergies  Allergen Reactions   Donepezil Nausea And Vomiting and Other (See Comments)    Severe vomiting  Ciprofloxacin Other (See Comments)    Not listed on MAR   Latex Other (See Comments)    Not listed on MAR   Levofloxacin Other (See Comments)    Not listed on MAR   Macrobid [Nitrofurantoin Monohyd Macro] Other (See Comments)    Not listed on MAR   Nitrofurantoin Other (See Comments)    Not listed on MAR   Sulfa Antibiotics Other (See Comments)    Not listed on MAR   Sulfamethoxazole     Not listed on MAR    Family History  Problem Relation Age of Onset   Congestive Heart Failure Mother    Arthritis Mother    Heart attack Father    Leukemia Sister    Coronary artery disease Neg Hx     Prior to Admission medications   Medication Sig Start Date End Date Taking? Authorizing Provider  acetaminophen (TYLENOL) 325 MG  tablet Take 650 mg by mouth as needed.    [provider]  apixaban (ELIQUIS) 5 MG TABS tablet Take 5 mg by mouth 2 (two) times daily.    [provider]  atenolol (TENORMIN) 25 MG tablet Take 25 mg by mouth daily.     [provider]  cephALEXin (KEFLEX) 250 MG capsule Take 250 mg by mouth daily.    [provider]  Cholecalciferol (VITAMIN D3) 125 MCG (5000 UT) TABS Take 1 tablet by mouth daily.    [provider]  doxycycline (VIBRA-TABS) 100 MG tablet Take 100 mg by mouth 2 (two) times daily. Taking for 10 days    [provider]  FLUoxetine (PROZAC) 20 MG capsule Take 20 mg by mouth at bedtime.    [provider]  FLUoxetine (PROZAC) 40 MG capsule Take 40 mg by mouth in the morning. 12/14/20   [provider]  furosemide (LASIX) 20 MG tablet Take 20 mg by mouth daily. Mon, Wed, Fri, Sat    [provider]  levothyroxine (SYNTHROID) 88 MCG tablet Take 88 mcg by mouth daily.    [provider]  losartan (COZAAR) 50 MG tablet Take 25 mg by mouth daily. 11/09/20   [provider]  Omega-3 Fatty Acids (OMEGA-3 FISH OIL) 1200 MG CAPS Take 2 capsules by mouth daily.    [provider]  omeprazole (PRILOSEC OTC) 20 MG tablet Take 20 mg by mouth daily.    [provider]  Polyethyl Glycol-Propyl Glycol (SYSTANE) 0.4-0.3 % GEL ophthalmic gel Place 1 application into both eyes at bedtime.    [provider]  rosuvastatin (CRESTOR) 20 MG tablet TAKE ONE TABLET EVERY EVENING 12/10/21   Fay Records, MD  saccharomyces boulardii (FLORASTOR) 250 MG capsule Take 250 mg by mouth 2 (two) times daily. Taking for 13 days    [provider]  traMADol (ULTRAM) 50 MG tablet Take 1 tablet (50 mg total) by mouth daily. 04/11/22   Medina-Vargas, Jaymes Graff C, NP    Physical Exam: Vitals:   06/22/22 1100 06/22/22 1228 06/22/22 1315 06/22/22 1415  BP: (!) 110/92 120/81 103/63 110/64  Pulse:  (!) 125 (!) 131 79 (!) 107  Resp: 15 (!) 21 (!) 25 20  Temp: 97.6 F (36.4 C)     TempSrc: Oral     SpO2: 91% 97% 95% 94%  Weight:      Height:       General:  Appears calm and comfortable and is in NAD Eyes:  L visual impairment chronically, normal lids, iris ENT:  grossly normal hearing, lips & tongue, mmm; artificial dentition Neck:  no LAD, masses or thyromegaly; no carotid bruits Cardiovascular:  Irregularly irregular with mild tachycardia, no m/r/g. No LE edema. Some reproducible CP. Respiratory:   CTA bilaterally with no wheezes/rales/rhonchi.  Normal to mildly increased respiratory effort. Abdomen:  soft, NT, ND Skin:  no rash or induration seen on limited exam Musculoskeletal:  grossly normal tone BUE/BLE, good ROM, no bony abnormality Psychiatric:  blunted mood and affect, speech fluent and appropriate Neurologic:  CN 2-12 grossly intact, moves all extremities in coordinated fashion   Radiological Exams on Admission: Independently reviewed - see discussion in A/P where applicable  DG Chest Portable 1 View  Result Date: 06/22/2022 CLINICAL DATA:  Chest pain. EXAM: PORTABLE CHEST 1 VIEW COMPARISON:  12/08/2018. FINDINGS: Heart is enlarged and the mediastinal contour is stable. Atherosclerotic calcification of the aorta is noted. A dual lead pacemaker is present over the left chest. Increased atelectasis or infiltrate is noted at the lung bases, greater on the left than on the right. There is a small left pleural effusion. No pneumothorax. No acute osseous abnormality. IMPRESSION: 1. Increased atelectasis or infiltrate at the lung bases, greater on the left than on the right. 2. Small left pleural effusion. Electronically Signed   By: Brett Fairy M.D.   On: 06/22/2022 04:50    EKG: Independently reviewed.  Afib with rate 104; nonspecific ST changes with no evidence of acute ischemia   Labs on Admission: I have personally reviewed the available labs and imaging studies at the  time of the admission.  Pertinent labs:    Glucose 141 BUN 21/Creatinine 1.14/GFR 46 - stable Mag++ 1.6 BNP 793.1; 577.3 on 1/4 HS troponin 9, 9 WBC 8.4 Hgb 9.5   Assessment and Plan: Principal Problem:   Chest pain of uncertain etiology Active Problems:   Hypertension   Hypothyroidism   PAF (paroxysmal atrial fibrillation) (HCC)   CKD (chronic kidney disease) stage 3, GFR 30-59 ml/min (HCC)   Hyperlipidemia   Recurrent depression (HCC)   Acute on chronic diastolic CHF (congestive heart failure) (HCC)   DNR (do not resuscitate)    Chest pain of uncertain etiology -Patient with acute onset of substernal/epigastric pain with associated chest pressure that came on overnight at rest -1/3 typical symptoms suggestive of noncardiac chest pain.  -CXR unremarkable.   -Initial cardiac HS troponin negative with negative delta.  -EKG not indicative of acute ischemia.   -Will plan to place in observation status on telemetry to rule out ACS by overnight observation.  -Pain could also be GI in nature; will increase prilosec 20 to protonix 40 and ask GI cocktail (Mylanta) -Pain could also be associated with mild volume overload (see below) -Will hold cardiology consult for now, as in discussion with patient and daughter she would not want intervention and prefers only conservative measures  Acute on chronic diastolic CHF -Patient with most recent echo in February showing preserved EF -She does take periodic Lasix -More LE edema lately -In the setting of fib with RVR, there is likely an acute diastolic CHF component in play with current symptoms -Mildly elevated BNP -CXR without apparent edema -She was given Lasix 20 mg IV in the ER and will give an additional dose tonight and follow  HTN -Continue atenolol, losartan -Will also add prn hydralazine  HLD -Continue Crestor -Hold fish oil due to limited inpatient utility  Afib -Has pacemaker -Continue atenolol for rate  control -Continue Eliquis  Hypothyroidism -Continue  Synthroid at current dose for now   Depression -Continue Prozac  Stage 3a CKD -Appears to be stable at this time -Will recheck tomorrow AM  DNR -I have discussed code status with the patient and her daughter and  they are in agreement that the patient would not desire resuscitation and would prefer to die a natural death should that situation arise. -She has a gold out of facility DNR form at the bedside    Advance Care Planning:   Code Status: DNR   Consults: TOC team  DVT Prophylaxis: Eliquis  Family Communication: Daughter was present throughout evaluation  Severity of Illness: The appropriate patient status for this patient is OBSERVATION. Observation status is judged to be reasonable and necessary in order to provide the required intensity of service to ensure the patient's safety. The patient's presenting symptoms, physical exam findings, and initial radiographic and laboratory data in the context of their medical condition is felt to place them at decreased risk for further clinical deterioration. Furthermore, it is anticipated that the patient will be medically stable for discharge from the hospital within 2 midnights of admission.   Author: Karmen Bongo, MD 06/22/2022 2:54 PM  For on call review [email or web address].

## 2022-06-23 ENCOUNTER — Observation Stay (HOSPITAL_COMMUNITY): Payer: Medicare Other

## 2022-06-23 DIAGNOSIS — Z8711 Personal history of peptic ulcer disease: Secondary | ICD-10-CM | POA: Diagnosis not present

## 2022-06-23 DIAGNOSIS — K219 Gastro-esophageal reflux disease without esophagitis: Secondary | ICD-10-CM | POA: Diagnosis present

## 2022-06-23 DIAGNOSIS — R0603 Acute respiratory distress: Secondary | ICD-10-CM

## 2022-06-23 DIAGNOSIS — Z96659 Presence of unspecified artificial knee joint: Secondary | ICD-10-CM | POA: Diagnosis present

## 2022-06-23 DIAGNOSIS — Z882 Allergy status to sulfonamides status: Secondary | ICD-10-CM | POA: Diagnosis not present

## 2022-06-23 DIAGNOSIS — Z79899 Other long term (current) drug therapy: Secondary | ICD-10-CM | POA: Diagnosis not present

## 2022-06-23 DIAGNOSIS — E785 Hyperlipidemia, unspecified: Secondary | ICD-10-CM | POA: Diagnosis present

## 2022-06-23 DIAGNOSIS — J9811 Atelectasis: Secondary | ICD-10-CM | POA: Diagnosis present

## 2022-06-23 DIAGNOSIS — Z950 Presence of cardiac pacemaker: Secondary | ICD-10-CM | POA: Diagnosis not present

## 2022-06-23 DIAGNOSIS — I480 Paroxysmal atrial fibrillation: Secondary | ICD-10-CM | POA: Diagnosis present

## 2022-06-23 DIAGNOSIS — E039 Hypothyroidism, unspecified: Secondary | ICD-10-CM | POA: Diagnosis present

## 2022-06-23 DIAGNOSIS — Z881 Allergy status to other antibiotic agents status: Secondary | ICD-10-CM | POA: Diagnosis not present

## 2022-06-23 DIAGNOSIS — M1990 Unspecified osteoarthritis, unspecified site: Secondary | ICD-10-CM | POA: Diagnosis present

## 2022-06-23 DIAGNOSIS — Z91040 Latex allergy status: Secondary | ICD-10-CM | POA: Diagnosis not present

## 2022-06-23 DIAGNOSIS — I130 Hypertensive heart and chronic kidney disease with heart failure and stage 1 through stage 4 chronic kidney disease, or unspecified chronic kidney disease: Secondary | ICD-10-CM | POA: Diagnosis present

## 2022-06-23 DIAGNOSIS — F339 Major depressive disorder, recurrent, unspecified: Secondary | ICD-10-CM | POA: Diagnosis present

## 2022-06-23 DIAGNOSIS — R079 Chest pain, unspecified: Secondary | ICD-10-CM | POA: Diagnosis present

## 2022-06-23 DIAGNOSIS — N1831 Chronic kidney disease, stage 3a: Secondary | ICD-10-CM | POA: Diagnosis present

## 2022-06-23 DIAGNOSIS — N393 Stress incontinence (female) (male): Secondary | ICD-10-CM | POA: Diagnosis present

## 2022-06-23 DIAGNOSIS — Z7901 Long term (current) use of anticoagulants: Secondary | ICD-10-CM | POA: Diagnosis not present

## 2022-06-23 DIAGNOSIS — M810 Age-related osteoporosis without current pathological fracture: Secondary | ICD-10-CM | POA: Diagnosis present

## 2022-06-23 DIAGNOSIS — I5033 Acute on chronic diastolic (congestive) heart failure: Secondary | ICD-10-CM | POA: Diagnosis present

## 2022-06-23 DIAGNOSIS — Z79890 Hormone replacement therapy (postmenopausal): Secondary | ICD-10-CM | POA: Diagnosis not present

## 2022-06-23 DIAGNOSIS — D631 Anemia in chronic kidney disease: Secondary | ICD-10-CM | POA: Diagnosis present

## 2022-06-23 DIAGNOSIS — Z66 Do not resuscitate: Secondary | ICD-10-CM | POA: Diagnosis present

## 2022-06-23 DIAGNOSIS — I351 Nonrheumatic aortic (valve) insufficiency: Secondary | ICD-10-CM | POA: Diagnosis present

## 2022-06-23 LAB — ECHOCARDIOGRAM COMPLETE
AR max vel: 1.74 cm2
AV Area VTI: 1.95 cm2
AV Area mean vel: 1.83 cm2
AV Mean grad: 22 mmHg
AV Peak grad: 34.3 mmHg
Ao pk vel: 2.93 m/s
Area-P 1/2: 3.76 cm2
Height: 68 in
P 1/2 time: 705 msec
S' Lateral: 2.25 cm
Weight: 3139.21 oz

## 2022-06-23 MED ORDER — MAGNESIUM SULFATE 2 GM/50ML IV SOLN
2.0000 g | Freq: Once | INTRAVENOUS | Status: AC
  Administered 2022-06-23: 2 g via INTRAVENOUS
  Filled 2022-06-23: qty 50

## 2022-06-23 MED ORDER — POTASSIUM CHLORIDE CRYS ER 20 MEQ PO TBCR
40.0000 meq | EXTENDED_RELEASE_TABLET | Freq: Once | ORAL | Status: AC
  Administered 2022-06-23: 40 meq via ORAL
  Filled 2022-06-23: qty 2

## 2022-06-23 MED ORDER — FUROSEMIDE 10 MG/ML IJ SOLN
40.0000 mg | Freq: Every day | INTRAMUSCULAR | Status: DC
Start: 2022-06-23 — End: 2022-06-24
  Administered 2022-06-23: 40 mg via INTRAVENOUS
  Filled 2022-06-23: qty 4

## 2022-06-23 NOTE — Evaluation (Signed)
Physical Therapy Evaluation Patient Details Name: Faith Parker MRN: [id number] DOB: 1930/08/03 Today's Date: 06/23/2022  History of Present Illness  Pt is a 88 y/o female admitted secondary to increased chest pain. PMH includes a fib, HTN, and CKD.  Clinical Impression  Pt admitted secondary to problem above with deficits below. Pt requiring min to min guard with mobility tasks this session. Mildly unsteady, but overall tolerated well. Pt currently from ALF and reports staff assists with ADL tasks. Recommending PT follow up at ALF at d/c to address mobility deficits. Will continue to follow acutely.        Recommendations for follow up therapy are one component of a multi-disciplinary discharge planning process, led by the attending physician.  Recommendations may be updated based on patient status, additional functional criteria and insurance authorization.  Follow Up Recommendations Home health PT (PT follow up at ALF)      Assistance Recommended at Discharge Intermittent Supervision/Assistance  Patient can return home with the following  A little help with bathing/dressing/bathroom;Assistance with cooking/housework;Assist for transportation;Direct supervision/assist for medications management    Equipment Recommendations None recommended by PT  Recommendations for Other Services       Functional Status Assessment Patient has had a recent decline in their functional status and demonstrates the ability to make significant improvements in function in a reasonable and predictable amount of time.     Precautions / Restrictions Precautions Precautions: Fall Restrictions Weight Bearing Restrictions: No      Mobility  Bed Mobility Overal bed mobility: Needs Assistance Bed Mobility: Supine to Sit     Supine to sit: Min assist     General bed mobility comments: Assist for trunk elevation. Increased time required.    Transfers Overall transfer level: Needs  assistance Equipment used: Rolling walker (2 wheels) Transfers: Sit to/from Stand Sit to Stand: Min assist, Min guard           General transfer comment: Min A for lift assist and steadying to stand. Min guard A for subsequent stands    Ambulation/Gait Ambulation/Gait assistance: Min guard Gait Distance (Feet): 100 Feet Assistive device: Rolling walker (2 wheels) Gait Pattern/deviations: Step-through pattern, Decreased stride length Gait velocity: Decreased     General Gait Details: Mild unsteadiness noted. Cues for sequencing using RW as pt more comfortable using rollator.  Stairs            Wheelchair Mobility    Modified Rankin (Stroke Patients Only)       Balance Overall balance assessment: Needs assistance Sitting-balance support: No upper extremity supported, Feet supported Sitting balance-Leahy Scale: Fair     Standing balance support: Bilateral upper extremity supported Standing balance-Leahy Scale: Poor Standing balance comment: Reliant on BUE support                             Pertinent Vitals/Pain Pain Assessment Pain Assessment: Faces Faces Pain Scale: Hurts little more Pain Location: generalized Pain Descriptors / Indicators: Guarding Pain Intervention(s): Limited activity within patient's tolerance, Monitored during session, Repositioned    Home Living Family/patient expects to be discharged to:: Assisted living                 Home Equipment: Rollator (4 wheels) Additional Comments: From [facility]    Prior Function Prior Level of Function : Needs assist             Mobility Comments: Uses rollator for ambulation. ADLs Comments:  Staff assists with ADLs.     Hand Dominance        Extremity/Trunk Assessment   Upper Extremity Assessment Upper Extremity Assessment: Generalized weakness    Lower Extremity Assessment Lower Extremity Assessment: Generalized weakness    Cervical / Trunk  Assessment Cervical / Trunk Assessment: Kyphotic  Communication   Communication: No difficulties  Cognition Arousal/Alertness: Awake/alert Behavior During Therapy: WFL for tasks assessed/performed Overall Cognitive Status: History of cognitive impairments - at baseline                                          General Comments General comments (skin integrity, edema, etc.): Pt's daughter present throughout.    Exercises     Assessment/Plan    PT Assessment Patient needs continued PT services  PT Problem List Decreased strength;Decreased activity tolerance;Decreased balance;Decreased mobility;Decreased knowledge of use of DME;Decreased knowledge of precautions       PT Treatment Interventions DME instruction;Gait training;Functional mobility training;Stair training;Therapeutic activities;Therapeutic exercise;Balance training;Patient/family education    PT Goals (Current goals can be found in the Care Plan section)  Acute Rehab PT Goals Patient Stated Goal: to go home PT Goal Formulation: With patient/family Time For Goal Achievement: 07/07/22 Potential to Achieve Goals: Good    Frequency Min 3X/week     Co-evaluation               AM-PAC PT "6 Clicks" Mobility  Outcome Measure Help needed turning from your back to your side while in a flat bed without using bedrails?: A Little Help needed moving from lying on your back to sitting on the side of a flat bed without using bedrails?: A Little Help needed moving to and from a bed to a chair (including a wheelchair)?: A Little Help needed standing up from a chair using your arms (e.g., wheelchair or bedside chair)?: A Little Help needed to walk in hospital room?: A Little Help needed climbing 3-5 steps with a railing? : A Lot 6 Click Score: 17    End of Session Equipment Utilized During Treatment: Gait belt Activity Tolerance: Patient tolerated treatment well Patient left: in chair;with call  bell/phone within reach;with family/visitor present Nurse Communication: Mobility status PT Visit Diagnosis: Unsteadiness on feet (R26.81);Muscle weakness (generalized) (M62.81);Difficulty in walking, not elsewhere classified (R26.2)    Time: 0630-1601 PT Time Calculation (min) (ACUTE ONLY): 24 min   Charges:   PT Evaluation $PT Eval Low Complexity: 1 Low PT Treatments $Gait Training: 8-22 mins        Faith Parker, DPT  Acute Rehabilitation Services  Office: [phone]   Faith Parker 06/23/2022, 3:56 PM

## 2022-06-23 NOTE — Progress Notes (Signed)
  Echocardiogram 2D Echocardiogram has been performed.  Faith Parker 06/23/2022, 1:50 PM

## 2022-06-23 NOTE — TOC Initial Note (Signed)
Transition of Care [facility]) - Initial/Assessment Note    Patient Details  Name: Faith Parker MRN: [id number] Date of Birth: [birth year]  Transition of Care [facility]) CM/SW Contact:    Milas Gain, [place] Phone Number: 06/23/2022, 2:26 PM  Clinical Narrative:                  CSW spoke with patient and [name]. Patient confirmed she comes from [facility]. Patients daughter confirmed she will transport patient back to ALF when medically ready for dc.  CSW spoke with Abby at [facility]. Abby gave CSW number to call when patient is medically ready for dc [phone]. Abby confirmed CSW will need to send FL2 and DC summary over when medically ready for dc. CSW will continue to follow and assist with patients dc planning needs.   Expected Discharge Plan: Assisted Living ([facility]) Barriers to Discharge: Continued Medical Work up   Patient Goals and CMS Choice Patient states their goals for this hospitalization and ongoing recovery are:: ALF CMS Medicare.gov Compare Post Acute Care list provided to:: Patient Represenative (must comment) ([name]) Choice offered to / list presented to : Adult Children ([name])  Expected Discharge Plan and Services Expected Discharge Plan: Assisted Living ([facility]) In-house Referral: Clinical Social Work                                            Prior Living Arrangements/Services   Lives withGovernment social research officer (From [place]) Patient language and need for interpreter reviewed:: Yes Do you feel safe going back to the place where you live?: Yes      Need for Family Participation in Patient Care: Yes (Comment) Care giver support system in place?: Yes (comment)   Criminal Activity/Legal Involvement Pertinent to Current Situation/Hospitalization: No - Comment as needed  Activities of Daily Living Home Assistive  Devices/Equipment: Environmental consultant (specify type) ADL Screening (condition at time of admission) Patient's cognitive ability adequate to safely complete daily activities?: Yes Is the patient deaf or have difficulty hearing?: No Does the patient have difficulty seeing, even when wearing glasses/contacts?: Yes Does the patient have difficulty concentrating, remembering, or making decisions?: No Patient able to express need for assistance with ADLs?: Yes Does the patient have difficulty dressing or bathing?: No Independently performs ADLs?: Yes (appropriate for developmental age) Does the patient have difficulty walking or climbing stairs?: Yes Weakness of Legs: Both Weakness of Arms/Hands: Left  Permission Sought/Granted Permission sought to share information with : Case Manager, Family Supports, Customer service manager Permission granted to share information with : Yes, Verbal Permission Granted  Share Information with NAME: [name]  Permission granted to share info w AGENCY: ALF  Permission granted to share info w Relationship: daughter  Permission granted to share info w Contact Information: [name] [phone]  Emotional Assessment Appearance:: Appears stated age Attitude/Demeanor/Rapport: Gracious Affect (typically observed): Calm Orientation: : Oriented to Self, Oriented to Place, Oriented to Situation Alcohol / Substance Use: Not Applicable Psych Involvement: No (comment)  Admission diagnosis:  Chest pain of uncertain etiology [G26.9] Patient Active Problem List   Diagnosis Date Noted   Acute on chronic diastolic CHF (congestive heart failure) ([place]) 06/22/2022   DNR (do not resuscitate) 06/22/2022   Urinary frequency 03/01/2022   CKD (chronic kidney disease) stage 3, GFR 30-59 ml/min ([place]) 02/07/2022  Hyperlipidemia 02/07/2022   Osteoarthritis, multiple sites 02/07/2022   Peripheral edema 02/07/2022   Recurrent depression ([place]) 02/07/2022   Blind left eye 02/07/2022    Abnormal nuclear cardiac imaging test    DOE (dyspnea on exertion) 10/17/2019   ARF (acute renal failure) ([place]) 10/17/2019   Acute lower UTI 10/17/2019   Acute cystitis without hematuria    PAF (paroxysmal atrial fibrillation) ([place]) 06/05/2018   Chronic anticoagulation 06/05/2018   Chronic UTI 06/05/2018   Cardiac device in situ    Chest pain of uncertain etiology    Bradycardia 06/03/2018   Thoracic aortic aneurysm [facility]): a. 4.1 cm ascending thoracic aortic aneurysm 01/23/17 01/23/2017   Syncope 01/21/2017   Hypertension 01/21/2017   Hypothyroidism 01/21/2017   PCP:  Mast, Man X, NP Pharmacy:   [place], [place] [address] Phone: [phone] Fax: [address], [place] [address] [place] [address] Phone: [phone] Fax: [phone]     Social Determinants of Health (SDOH) Interventions    Readmission Risk Interventions     No data to display

## 2022-06-23 NOTE — Progress Notes (Signed)
Pt is having 3/10 chest pain describing it as "heaviness". Hal Hope, MD paged and made aware, 12-lead EKG obtained, Tramadol administered.   Elaina Hoops, RN

## 2022-06-23 NOTE — Progress Notes (Signed)
PROGRESS NOTE    Faith FILIPPI  NLG:[id number] DOB: 04/02/1930 DOA: 06/22/2022 PCP: Mast, Man X, NP  89/F with history of paroxysmal A-fib, hypertension, hypothyroidism, PPM presented to the ED with chest discomfort and dyspnea, increased swelling, patient is a very poor historian, long history of A-fib, previously had volume overload in January required few days of diuretics, subsequently has been on diuretics 4 days a week In the ED EKG was nonacute, BNP elevated at 793, high-sensitivity troponin was negative, creatinine was 1.1, chest x-ray noted small basilar effusion and atelectasis  Subjective: -May have had an episode of chest discomfort overnight, denies any problems this morning, is also very poor historian  Assessment and Plan:  Acute on chronic diastolic CHF -Echo in 0/81 with preserved EF, grade 1 diastolic dysfunction -Clinically mildly volume overloaded -Continue IV Lasix today, will repeat echo given intermittent chest discomfort to rule out WMA -Ambulate, wean O2  Intermittent chest pain -Now resolved, EKG nonacute, high-sensitivity troponins are negative, likely secondary to CHF and or A-fib -Monitor clinically, cath 11/20 noted minimal nonobstructive CAD  Paroxysmal atrial fibrillation -Rate controlled, continue atenolol and Eliquis  Hypothyroidism -Continue Synthroid  CKD 3a -Stable, monitor with diuresis  Hypertension -Stable continue atenolol and losartan  Normocytic anemia -Defer need for further work-up to PCP  DVT prophylaxis: Eliquis Code Status: DNR Family Communication: Discussed with daughter at bedside Disposition Plan: Back to [facility] if stable  Consultants:    Procedures:   Antimicrobials:    Objective: Vitals:   06/22/22 1751 06/22/22 2002 06/23/22 0002 06/23/22 0833  BP: 105/64 94/60 105/76 127/82  Pulse: 75 (!) 113 73 72  Resp:  '19 20 20  '$ Temp:  98.2 F (36.8 C) 98.2 F (36.8 C) 98 F (36.7 C)  TempSrc:  Oral  Oral Oral  SpO2:  95% 93% 95%  Weight:      Height:        Intake/Output Summary (Last 24 hours) at 06/23/2022 1133 Last data filed at 06/23/2022 0844 Gross per 24 hour  Intake --  Output 750 ml  Net -750 ml   Filed Weights   06/22/22 0420  Weight: 89 kg    Examination:  General exam: Pleasant elderly chronically ill female sitting up in bed, AAO x2, mild to moderate cognitive deficits CVS: S1-S2, irregularly irregular rhythm Lungs: Rare basilar rales Abdomen: Soft, obese, nontender, bowel sounds present Extremities: 1+ edema  Skin: No rashes Psychiatry:  Mood & affect appropriate.     Data Reviewed:   CBC: Recent Labs  Lab 06/22/22 0435  WBC 8.4  NEUTROABS 7.2  HGB 9.5*  HCT 29.2*  MCV 84.6  PLT 448   Basic Metabolic Panel: Recent Labs  Lab 06/22/22 0435  NA 137  K 3.6  CL 102  CO2 24  GLUCOSE 141*  BUN 21  CREATININE 1.14*  CALCIUM 8.7*  MG 1.6*   GFR: Estimated Creatinine Clearance: 38.3 mL/min (A) (by C-G formula based on SCr of 1.14 mg/dL (H)). Liver Function Tests: Recent Labs  Lab 06/22/22 0435  AST 27  ALT 26  ALKPHOS 56  BILITOT 0.4  PROT 6.1*  ALBUMIN 3.4*   Recent Labs  Lab 06/22/22 0435  LIPASE 37   No results for input(s): "AMMONIA" in the last 168 hours. Coagulation Profile: No results for input(s): "INR", "PROTIME" in the last 168 hours. Cardiac Enzymes: No results for input(s): "CKTOTAL", "CKMB", "CKMBINDEX", "TROPONINI" in the last 168 hours. BNP (last 3 results) Recent Labs  12/10/21 1641  PROBNP 697   HbA1C: No results for input(s): "HGBA1C" in the last 72 hours. CBG: No results for input(s): "GLUCAP" in the last 168 hours. Lipid Profile: No results for input(s): "CHOL", "HDL", "LDLCALC", "TRIG", "CHOLHDL", "LDLDIRECT" in the last 72 hours. Thyroid Function Tests: No results for input(s): "TSH", "T4TOTAL", "FREET4", "T3FREE", "THYROIDAB" in the last 72 hours. Anemia Panel: No results for input(s):  "VITAMINB12", "FOLATE", "FERRITIN", "TIBC", "IRON", "RETICCTPCT" in the last 72 hours. Urine analysis:    Component Value Date/Time   COLORURINE YELLOW 10/16/2019 1923   APPEARANCEUR CLOUDY (A) 10/16/2019 1923   LABSPEC 1.014 10/16/2019 1923   PHURINE 6.0 10/16/2019 1923   GLUCOSEU 50 (A) 10/16/2019 1923   HGBUR SMALL (A) 10/16/2019 1923   BILIRUBINUR NEGATIVE 10/16/2019 1923   KETONESUR 5 (A) 10/16/2019 1923   PROTEINUR >=300 (A) 10/16/2019 1923   UROBILINOGEN 0.2 06/30/2016 1149   NITRITE NEGATIVE 10/16/2019 1923   LEUKOCYTESUR NEGATIVE 10/16/2019 1923   Sepsis Labs: '@LABRCNTIP'$ (procalcitonin:4,lacticidven:4)  )No results found for this or any previous visit (from the past 240 hour(s)).   Radiology Studies: DG Chest Portable 1 View  Result Date: 06/22/2022 CLINICAL DATA:  Chest pain. EXAM: PORTABLE CHEST 1 VIEW COMPARISON:  12/08/2018. FINDINGS: Heart is enlarged and the mediastinal contour is stable. Atherosclerotic calcification of the aorta is noted. A dual lead pacemaker is present over the left chest. Increased atelectasis or infiltrate is noted at the lung bases, greater on the left than on the right. There is a small left pleural effusion. No pneumothorax. No acute osseous abnormality. IMPRESSION: 1. Increased atelectasis or infiltrate at the lung bases, greater on the left than on the right. 2. Small left pleural effusion. Electronically Signed   By: Brett Fairy M.D.   On: 06/22/2022 04:50     Scheduled Meds:  apixaban  5 mg Oral BID   artificial tears  1 Application Both Eyes QHS   atenolol  25 mg Oral Daily   FLUoxetine  20 mg Oral Daily   FLUoxetine  40 mg Oral q AM   furosemide  40 mg Intravenous Daily   levothyroxine  88 mcg Oral Daily   losartan  37.5 mg Oral Daily   pantoprazole  40 mg Oral Daily   rosuvastatin  20 mg Oral Daily   Continuous Infusions:   LOS: 0 days    Time spent: 11mn    PDomenic Polite MD [facility]   06/23/2022, 11:33  AM

## 2022-06-24 DIAGNOSIS — R079 Chest pain, unspecified: Secondary | ICD-10-CM | POA: Diagnosis not present

## 2022-06-24 LAB — BASIC METABOLIC PANEL
Anion gap: 8 (ref 5–15)
BUN: 21 mg/dL (ref 8–23)
CO2: 26 mmol/L (ref 22–32)
Calcium: 8.2 mg/dL — ABNORMAL LOW (ref 8.9–10.3)
Chloride: 102 mmol/L (ref 98–111)
Creatinine, Ser: 1.49 mg/dL — ABNORMAL HIGH (ref 0.44–1.00)
GFR, Estimated: 33 mL/min — ABNORMAL LOW (ref >60)
Glucose, Bld: 118 mg/dL — ABNORMAL HIGH (ref 70–99)
Potassium: 3.5 mmol/L (ref 3.5–5.1)
Sodium: 136 mmol/L (ref 135–145)

## 2022-06-24 LAB — MAGNESIUM: Magnesium: 2.2 mg/dL (ref 1.7–2.4)

## 2022-06-24 MED ORDER — POTASSIUM CHLORIDE CRYS ER 20 MEQ PO TBCR
40.0000 meq | EXTENDED_RELEASE_TABLET | Freq: Once | ORAL | Status: AC
  Administered 2022-06-24: 40 meq via ORAL
  Filled 2022-06-24: qty 2

## 2022-06-24 MED ORDER — FUROSEMIDE 20 MG PO TABS
20.0000 mg | ORAL_TABLET | Freq: Every day | ORAL | Status: DC
  Administered 2022-06-24 – 2022-06-25 (×2): 20 mg via ORAL
  Filled 2022-06-24 (×2): qty 1

## 2022-06-24 MED ORDER — ALUM & MAG HYDROXIDE-SIMETH 200-200-20 MG/5ML PO SUSP
15.0000 mL | Freq: Four times a day (QID) | ORAL | Status: DC | PRN
  Administered 2022-06-24: 15 mL via ORAL
  Filled 2022-06-24: qty 30

## 2022-06-24 MED ORDER — AMIODARONE HCL 200 MG PO TABS
200.0000 mg | ORAL_TABLET | Freq: Two times a day (BID) | ORAL | Status: DC
  Administered 2022-06-24 – 2022-06-25 (×3): 200 mg via ORAL
  Filled 2022-06-24 (×3): qty 1

## 2022-06-24 NOTE — Progress Notes (Signed)
PROGRESS NOTE    Faith Parker  ZSW:[id number] DOB: [birth year] DOA: 06/22/2022 PCP: Mast, Man X, NP  89/F with history of paroxysmal A-fib, hypertension, hypothyroidism, PPM presented to the ED with chest discomfort and dyspnea, increased swelling, patient is a very poor historian, long history of A-fib, previously had volume overload in January required few days of diuretics, subsequently has been on diuretics 4 days a week In the ED EKG was nonacute, BNP elevated at 793, high-sensitivity troponin was negative, creatinine was 1.1, chest x-ray noted small basilar effusion and atelectasis  Subjective: -Feels okay, denies any chest discomfort or shortness of breath  Assessment and Plan:  Acute on chronic diastolic CHF -Echo in 3/22 with preserved EF, grade 1 diastolic dysfunction -Volume status improving she is 2 L negative, creatinine bumped to 1.4 today, will switch to p.o., hold ARB today -Ambulate, weaned off oxygen  Intermittent chest pain -Now resolved, EKG nonacute, high-sensitivity troponins are negative, likely secondary to CHF and or A-fib -cath 11/20 noted minimal nonobstructive CAD -Echo with small pleural effusion however this was noted on previous echo in February as well  Paroxysmal atrial fibrillation -Rate controlled, continue atenolol and Eliquis -Case discussed with Dr. Caryl Comes, who is her EP doctor-he felt she has been mostly in A-fib for the last several months on her pacemaker eval, recommended starting amiodarone 200 Mg twice daily, he will follow-up in clinic in a month  Hypothyroidism -Continue Synthroid  AKI CKD 3a -Mild bump in creatinine, cut down diuretics, hold losartan  Hypertension -Stable, continue atenolol, hold losartan  Normocytic anemia -Defer need for further work-up to PCP  DVT prophylaxis: Eliquis Code Status: DNR Family Communication: Discussed with daughter at bedside Disposition Plan: Back to [facility] tomorrow if  stable  Consultants: d/w EP Dr.Klein   Procedures:   Antimicrobials:    Objective: Vitals:   06/23/22 2027 06/24/22 0338 06/24/22 0339 06/24/22 0754  BP: 110/67 (!) 148/87  123/67  Pulse: 79 83  83  Resp: (!) '22 18  18  '$ Temp: 99.4 F (37.4 C) 98.2 F (36.8 C)  98 F (36.7 C)  TempSrc: Oral Oral  Oral  SpO2: 94% 95%  97%  Weight:   93.9 kg   Height:        Intake/Output Summary (Last 24 hours) at 06/24/2022 1056 Last data filed at 06/24/2022 0343 Gross per 24 hour  Intake 240 ml  Output 1750 ml  Net -1510 ml   Filed Weights   06/22/22 0420 06/24/22 0339  Weight: 89 kg 93.9 kg    Examination:  General exam: Pleasant elderly chronically ill female sitting up in bed, AAO x2, moderate cognitive deficits CVS: S1-S2, irregularly irregular rhythm Lungs: Clear bilaterally Abdomen: Soft, obese, nontender, bowel sounds present Extremities: Trace edema  Skin: No rashes Psychiatry:  Mood & affect appropriate.     Data Reviewed:   CBC: Recent Labs  Lab 06/22/22 0435  WBC 8.4  NEUTROABS 7.2  HGB 9.5*  HCT 29.2*  MCV 84.6  PLT 025   Basic Metabolic Panel: Recent Labs  Lab 06/22/22 0435 06/24/22 0239  NA 137 136  K 3.6 3.5  CL 102 102  CO2 24 26  GLUCOSE 141* 118*  BUN 21 21  CREATININE 1.14* 1.49*  CALCIUM 8.7* 8.2*  MG 1.6* 2.2   GFR: Estimated Creatinine Clearance: 30.1 mL/min (A) (by C-G formula based on SCr of 1.49 mg/dL (H)). Liver Function Tests: Recent Labs  Lab 06/22/22 0435  AST 27  ALT  26  ALKPHOS 56  BILITOT 0.4  PROT 6.1*  ALBUMIN 3.4*   Recent Labs  Lab 06/22/22 0435  LIPASE 37   No results for input(s): "AMMONIA" in the last 168 hours. Coagulation Profile: No results for input(s): "INR", "PROTIME" in the last 168 hours. Cardiac Enzymes: No results for input(s): "CKTOTAL", "CKMB", "CKMBINDEX", "TROPONINI" in the last 168 hours. BNP (last 3 results) Recent Labs    12/10/21 1641  PROBNP 697   HbA1C: No results for  input(s): "HGBA1C" in the last 72 hours. CBG: No results for input(s): "GLUCAP" in the last 168 hours. Lipid Profile: No results for input(s): "CHOL", "HDL", "LDLCALC", "TRIG", "CHOLHDL", "LDLDIRECT" in the last 72 hours. Thyroid Function Tests: No results for input(s): "TSH", "T4TOTAL", "FREET4", "T3FREE", "THYROIDAB" in the last 72 hours. Anemia Panel: No results for input(s): "VITAMINB12", "FOLATE", "FERRITIN", "TIBC", "IRON", "RETICCTPCT" in the last 72 hours. Urine analysis:    Component Value Date/Time   COLORURINE YELLOW 10/16/2019 1923   APPEARANCEUR CLOUDY (A) 10/16/2019 1923   LABSPEC 1.014 10/16/2019 1923   PHURINE 6.0 10/16/2019 1923   GLUCOSEU 50 (A) 10/16/2019 1923   HGBUR SMALL (A) 10/16/2019 1923   BILIRUBINUR NEGATIVE 10/16/2019 1923   KETONESUR 5 (A) 10/16/2019 1923   PROTEINUR >=300 (A) 10/16/2019 1923   UROBILINOGEN 0.2 06/30/2016 1149   NITRITE NEGATIVE 10/16/2019 1923   LEUKOCYTESUR NEGATIVE 10/16/2019 1923   Sepsis Labs: '@LABRCNTIP'$ (procalcitonin:4,lacticidven:4)  )No results found for this or any previous visit (from the past 240 hour(s)).   Radiology Studies: ECHOCARDIOGRAM COMPLETE  Result Date: 06/23/2022    ECHOCARDIOGRAM REPORT   Patient Name:   Faith Parker Date of Exam: 06/23/2022 Medical Rec #:  [id number]      Height:       68.0 in Accession #:    [id number]     Weight:       196.2 lb Date of Birth:  May 09, 1930       BSA:          2.027 m Patient Age:    20 years       BP:           111/70 mmHg Patient Gender: F              HR:           81 bpm. Exam Location:  Inpatient Procedure: 2D Echo, Cardiac Doppler and Color Doppler Indications:    Acute respiratory distress  History:        Patient has prior history of Echocardiogram examinations, most                 recent 01/13/2022. Pacemaker, Arrythmias:Atrial Fibrillation; Risk                 Factors:Hypertension.  Sonographer:    Joette Catching RCS Referring Phys: [place]  1.  Moderate hypertrophy of the basal septum with otherwise mild LVH. Left ventricular ejection fraction, by estimation, is 60 to 65%. The left ventricle has normal function. The left ventricle has no regional wall motion abnormalities. There is moderate  asymmetric left ventricular hypertrophy. Left ventricular diastolic parameters are indeterminate. Elevated left ventricular end-diastolic pressure.  2. Right ventricular systolic function is normal. The right ventricular size is normal. There is mildly elevated pulmonary artery systolic pressure.  3. Left atrial size was severely dilated.  4. A small pericardial effusion is present. The pericardial effusion is posterior and lateral to the left ventricle. There is  no evidence of cardiac tamponade. Moderate pleural effusion in the left lateral region.  5. The mitral valve is normal in structure. Trivial mitral valve regurgitation. No evidence of mitral stenosis.  6. The aortic valve is tricuspid. There is moderate calcification of the aortic valve. There is moderate thickening of the aortic valve. Aortic valve regurgitation is mild. Moderate aortic valve stenosis. Aortic valve area, by VTI measures 1.95 cm. Aortic valve mean gradient measures 22.0 mmHg. Aortic valve Vmax measures 2.93 m/s.  7. Aortic dilatation noted. There is mild dilatation of the aortic root, measuring 37 mm. There is moderate dilatation of the ascending aorta, measuring 46 mm.  8. The inferior vena cava is dilated in size with >50% respiratory variability, suggesting right atrial pressure of 8 mmHg. FINDINGS  Left Ventricle: Moderate hypertrophy of the basal septum with otherwise mild LVH. Left ventricular ejection fraction, by estimation, is 60 to 65%. The left ventricle has normal function. The left ventricle has no regional wall motion abnormalities. The left ventricular internal cavity size was normal in size. There is moderate asymmetric left ventricular hypertrophy. Left ventricular diastolic  function could not be evaluated due to atrial fibrillation. Left ventricular diastolic parameters are indeterminate. Elevated left ventricular end-diastolic pressure. Right Ventricle: The right ventricular size is normal. No increase in right ventricular wall thickness. Right ventricular systolic function is normal. There is mildly elevated pulmonary artery systolic pressure. The tricuspid regurgitant velocity is 2.98  m/s, and with an assumed right atrial pressure of 8 mmHg, the estimated right ventricular systolic pressure is 19.1 mmHg. Left Atrium: Left atrial size was severely dilated. Right Atrium: Right atrial size was normal in size. Pericardium: A small pericardial effusion is present. The pericardial effusion is posterior and lateral to the left ventricle. There is no evidence of cardiac tamponade. Mitral Valve: The mitral valve is normal in structure. Trivial mitral valve regurgitation. No evidence of mitral valve stenosis. Tricuspid Valve: The tricuspid valve is normal in structure. Tricuspid valve regurgitation is mild . No evidence of tricuspid stenosis. Aortic Valve: The aortic valve is tricuspid. There is moderate calcification of the aortic valve. There is moderate thickening of the aortic valve. Aortic valve regurgitation is mild. Aortic regurgitation PHT measures 705 msec. Moderate aortic stenosis is present. Aortic valve mean gradient measures 22.0 mmHg. Aortic valve peak gradient measures 34.3 mmHg. Aortic valve area, by VTI measures 1.95 cm. Pulmonic Valve: The pulmonic valve was normal in structure. Pulmonic valve regurgitation is trivial. No evidence of pulmonic stenosis. Aorta: Aortic dilatation noted. There is mild dilatation of the aortic root, measuring 37 mm. There is moderate dilatation of the ascending aorta, measuring 46 mm. Venous: The inferior vena cava is dilated in size with greater than 50% respiratory variability, suggesting right atrial pressure of 8 mmHg. IAS/Shunts: No atrial  level shunt detected by color flow Doppler. Additional Comments: There is a moderate pleural effusion in the left lateral region.  LEFT VENTRICLE PLAX 2D LVIDd:         3.95 cm   Diastology LVIDs:         2.25 cm   LV e' medial:    5.15 cm/s LV PW:         1.15 cm   LV E/e' medial:  24.3 LV IVS:        1.35 cm   LV e' lateral:   10.30 cm/s LVOT diam:     2.30 cm   LV E/e' lateral: 12.1 LV SV:  107 LV SV Index:   53 LVOT Area:     4.15 cm  RIGHT VENTRICLE            IVC RV Basal diam:  4.10 cm    IVC diam: 2.90 cm RV Mid diam:    2.30 cm RV S prime:     8.93 cm/s TAPSE (M-mode): 1.3 cm LEFT ATRIUM             Index        RIGHT ATRIUM           Index LA diam:        4.00 cm 1.97 cm/m   RA Area:     21.00 cm LA Vol (A2C):   74.1 ml 36.55 ml/m  RA Volume:   49.80 ml  24.57 ml/m LA Vol (A4C):   81.4 ml 40.15 ml/m LA Biplane Vol: 80.9 ml 39.91 ml/m  AORTIC VALVE AV Area (Vmax):    1.74 cm AV Area (Vmean):   1.83 cm AV Area (VTI):     1.95 cm AV Vmax:           293.00 cm/s AV Vmean:          205.500 cm/s AV VTI:            0.548 m AV Peak Grad:      34.3 mmHg AV Mean Grad:      22.0 mmHg LVOT Vmax:         122.50 cm/s LVOT Vmean:        90.300 cm/s LVOT VTI:          0.258 m LVOT/AV VTI ratio: 0.47 AI PHT:            705 msec  AORTA Ao Root diam: 3.65 cm Ao Asc diam:  4.60 cm MITRAL VALVE                TRICUSPID VALVE MV Area (PHT): 3.76 cm     TV Peak grad:   30.2 mmHg MV Decel Time: 202 msec     TV Vmax:        2.75 m/s MV E velocity: 125.00 cm/s  TR Peak grad:   35.5 mmHg MV A velocity: 31.50 cm/s   TR Vmax:        298.00 cm/s MV E/A ratio:  3.97                             SHUNTS                             Systemic VTI:  0.26 m                             Systemic Diam: 2.30 cm Skeet Latch MD Electronically signed by Skeet Latch MD Signature Date/Time: 06/23/2022/4:05:09 PM    Final      Scheduled Meds:  apixaban  5 mg Oral BID   artificial tears  1 Application Both Eyes QHS    atenolol  25 mg Oral Daily   FLUoxetine  20 mg Oral Daily   FLUoxetine  40 mg Oral q AM   furosemide  20 mg Oral Daily   levothyroxine  88 mcg Oral Daily   pantoprazole  40 mg Oral Daily   potassium chloride  40 mEq Oral Once  rosuvastatin  20 mg Oral Daily   Continuous Infusions:   LOS: 1 day    Time spent: 32mn  PDomenic Polite MD [facility]   06/24/2022, 10:56 AM

## 2022-06-24 NOTE — Progress Notes (Signed)
Patient reporting chest tightness in lower chest abdominal area. Patient just ate a large meal. Notified MD.

## 2022-06-24 NOTE — TOC Progression Note (Signed)
Transition of Care [facility]) - Progression Note    Patient Details  Name: Faith Parker MRN: [id number] Date of Birth: 1930-04-26  Transition of Care [facility]) CM/SW [place], [place] Phone Number: 06/24/2022, 11:30 AM  Clinical Narrative:     Patient is from [facility]. Plan is to return when medically ready for dc. Patient will transport by daughter.On call telephone number # [phone] . The reporting nurse on the hall telephone number [phone]. FAX# [phone] to send DC summary,FL2, and HH orders.CSW will continue to follow and assist with patients dc planning needs.  Expected Discharge Plan: Assisted Living ([facility]) Barriers to Discharge: Continued Medical Work up  Expected Discharge Plan and Services Expected Discharge Plan: Assisted Living ([facility]) In-house Referral: Clinical Social Work                                             Social Determinants of Health (SDOH) Interventions    Readmission Risk Interventions     No data to display

## 2022-06-24 NOTE — Progress Notes (Signed)
Mobility Specialist Progress Note    06/24/22 1537  Mobility  Activity Ambulated with assistance to bathroom  Level of Assistance Minimal assist, patient does 75% or more  Assistive Device Front wheel walker  Distance Ambulated (ft) 25 ft (15+10)  Activity Response Tolerated well  $Mobility charge 1 Mobility   Pre-Mobility: 68 HR, 98% SpO2  Pt received in chair and agreeable. After void, declining further ambulation c/o fatigue from being up in the chair. Left with call bell in reach and RN present.   Hildred Alamin Mobility Specialist

## 2022-06-25 DIAGNOSIS — R079 Chest pain, unspecified: Secondary | ICD-10-CM | POA: Diagnosis not present

## 2022-06-25 LAB — BASIC METABOLIC PANEL
Anion gap: 7 (ref 5–15)
BUN: 15 mg/dL (ref 8–23)
CO2: 27 mmol/L (ref 22–32)
Calcium: 8 mg/dL — ABNORMAL LOW (ref 8.9–10.3)
Chloride: 101 mmol/L (ref 98–111)
Creatinine, Ser: 1.12 mg/dL — ABNORMAL HIGH (ref 0.44–1.00)
GFR, Estimated: 47 mL/min — ABNORMAL LOW (ref >60)
Glucose, Bld: 97 mg/dL (ref 70–99)
Potassium: 4 mmol/L (ref 3.5–5.1)
Sodium: 135 mmol/L (ref 135–145)

## 2022-06-25 MED ORDER — FUROSEMIDE 20 MG PO TABS: 20.0000 mg | ORAL_TABLET | Freq: Every day | ORAL | 0 refills | Status: AC

## 2022-06-25 MED ORDER — AMIODARONE HCL 200 MG PO TABS: 200.0000 mg | ORAL_TABLET | Freq: Two times a day (BID) | ORAL | 0 refills | Status: AC

## 2022-06-25 NOTE — TOC Transition Note (Signed)
Transition of Care [facility]) - CM/SW Discharge Note   Patient Details  Name: Faith Parker MRN: [id number] Date of Birth: 1930/07/25  Transition of Care [facility]) CM/SW Contact:  Bary Castilla, LCSW Phone Number:336 [phone] 06/25/2022, 12:45 PM   Clinical Narrative:    Patient will DC to:?[facility] date:?06/25/2022 Transport QR:[id number]   Per MD patient ready for DC to [facility] RN, patient, patient's family, and facility notified of DC. Discharge Summary sent to facility. RN given number for report  [phone].. DC packet on chart. Daughter to transport to facility.   CSW signing off.   [place], [place] [phone]      Barriers to Discharge: Continued Medical Work up   Patient Goals and CMS Choice Patient states their goals for this hospitalization and ongoing recovery are:: ALF CMS Medicare.gov Compare Post Acute Care list provided to:: Patient Represenative (must comment) ([name]) Choice offered to / list presented to : Adult Children ([name])  Discharge Placement                       Discharge Plan and Services In-house Referral: Clinical Social Work                                   Social Determinants of Health (SDOH) Interventions     Readmission Risk Interventions     No data to display

## 2022-06-25 NOTE — Discharge Summary (Addendum)
Physician Discharge Summary  Faith Parker OQH:[id number] DOB: July 24, 1930 DOA: 06/22/2022  PCP: Mast, Man X, NP  Admit date: 06/22/2022 Discharge date: 06/25/2022  Time spent: 35  minutes  Recommendations for Outpatient Follow-up:  Please follow-up with EP Dr. Caryl Comes in 1 month Follow-up with cardiology Dr. Harrington Challenger in 2 weeks Recommend Palliative eval in 1 month   Discharge Diagnoses:  Principal Problem: Acute on chronic diastolic CHF Paroxysmal atrial fibrillation Moderate aortic stenosis   Hypertension   Hypothyroidism   PAF (paroxysmal atrial fibrillation) (HCC)   CKD (chronic kidney disease) stage 3, GFR 30-59 ml/min (HCC)   Hyperlipidemia   Recurrent depression (HCC)   Acute on chronic diastolic CHF (congestive heart failure) ([place])   DNR (do not resuscitate) Mild cognitive deficits  Discharge Condition: Stable  Diet recommendation: Low-sodium, heart healthy  Filed Weights   06/22/22 0420 06/24/22 0339  Weight: 89 kg 93.9 kg    History of present illness:   88/F with history of paroxysmal A-fib, hypertension, hypothyroidism, PPM presented to the ED with chest discomfort and dyspnea, increased swelling, patient is a very poor historian, long history of A-fib, previously had volume overload in January required few days of diuretics, subsequently has been on diuretics 4 days a week In the ED EKG was nonacute, BNP elevated at 793, high-sensitivity troponin was negative, creatinine was 1.1, chest x-ray noted small basilar effusion and atelectasis  Hospital Course:   Acute on chronic diastolic CHF -Echo 5/46 with preserved EF, moderate aortic stenosis, progressed since 2/23 -Volume status improving, she is 3 L negative, clinically improved, weaned off O2 -Transitioned back to Lasix 20 Mg daily, was previously taking Lasix 20 Mg 3 days a week, we also suspect her persistent A-fib is contributing to CHF, I was called by Dr. Devona Konig is her EP, he recommended starting low-dose  amiodarone ('200mg'$  BID) for increased A-fib burden on pacemaker interrogation and will follow-up in 1 month -In terms of her moderate aortic stenosis which is also likely contributing to above symptoms, recommend conservative management only which is what patient prefers, follow-up with Dr. Harrington Challenger -Ambulate, weaned off oxygen   Intermittent chest pain -Now resolved, EKG nonacute, high-sensitivity troponins are negative, likely secondary to CHF, A-fib and aortic stenosis -cath 11/20 noted minimal nonobstructive CAD -Echo with small pericardial effusion however this was noted on previous echo in February as well   Paroxysmal atrial fibrillation -Rate controlled, continue atenolol and Eliquis -Case discussed with Dr. Caryl Comes, who is her EP doctor-he felt she has been mostly in A-fib for the last several months on her pacemaker eval, recommended starting amiodarone 200 Mg twice daily, he will follow-up in clinic in a month  Cognitive deficits -Noted this admission   Hypothyroidism -Continue Synthroid   AKI CKD 3a -Mild bump in creatinine, resolved, low-dose losartan resumed   Hypertension -Stable, continue atenolol, hold losartan   Normocytic anemia -Defer need for further work-up to PCP    Consultations: Discussed with EP Dr. Caryl Comes  Discharge Exam: Vitals:   06/25/22 0445 06/25/22 0902  BP: (!) 144/88 134/79  Pulse: 73   Resp: 16   Temp: (!) 97.4 F (36.3 C)   SpO2:      General exam: Pleasant elderly chronically ill female sitting up in bed, AAO x2, moderate cognitive deficits CVS: S1-S2, irregularly irregular rhythm Lungs: Clear bilaterally Abdomen: Soft, obese, nontender, bowel sounds present Extremities: No edema Skin: No rashes  Discharge Instructions   Discharge Instructions     Diet - low sodium  heart healthy   Complete by: As directed    Increase activity slowly   Complete by: As directed       Allergies as of 06/25/2022       Reactions   Donepezil  Nausea And Vomiting, Other (See Comments)   Severe vomiting   Ciprofloxacin Other (See Comments)   Not listed on MAR   Latex Other (See Comments)   Not listed on MAR   Levofloxacin Other (See Comments)   Not listed on MAR   Macrobid [nitrofurantoin Monohyd Macro] Other (See Comments)   Not listed on MAR   Nitrofurantoin Other (See Comments)   Not listed on MAR   Sulfa Antibiotics Other (See Comments)   Not listed on MAR   Sulfamethoxazole    Not listed on MAR        Medication List     TAKE these medications    amiodarone 200 MG tablet Commonly known as: PACERONE Take 1 tablet (200 mg total) by mouth 2 (two) times daily.   apixaban 5 MG Tabs tablet Commonly known as: ELIQUIS Take 5 mg by mouth 2 (two) times daily.   atenolol 25 MG tablet Commonly known as: TENORMIN Take 25 mg by mouth daily.   cephALEXin 250 MG capsule Commonly known as: KEFLEX Take 250 mg by mouth daily.   FLUoxetine 20 MG capsule Commonly known as: PROZAC Take 20 mg by mouth daily.   FLUoxetine 40 MG capsule Commonly known as: PROZAC Take 40 mg by mouth in the morning.   furosemide 20 MG tablet Commonly known as: LASIX Take 1 tablet (20 mg total) by mouth daily. Take 20 mg by mouth on Monday, Tuesday, Wednesday, Thursday, Friday, and Saturday What changed: when to take this   levothyroxine 88 MCG tablet Commonly known as: SYNTHROID Take 88 mcg by mouth daily.   losartan 25 MG tablet Commonly known as: COZAAR Take 37.5 mg by mouth daily.   nystatin powder Generic drug: nystatin Apply 1 Application topically 2 (two) times daily. For 3 weeks   Omega-3 Fish Oil 1200 MG Caps Take 2,400 mg by mouth daily.   omeprazole 20 MG tablet Commonly known as: PRILOSEC OTC Take 20 mg by mouth daily.   rosuvastatin 20 MG tablet Commonly known as: CRESTOR TAKE ONE TABLET EVERY EVENING What changed: when to take this   Systane 0.4-0.3 % Gel ophthalmic gel Generic drug: Polyethyl  Glycol-Propyl Glycol Place 1 application into both eyes at bedtime.   traMADol 50 MG tablet Commonly known as: ULTRAM Take 1 tablet (50 mg total) by mouth daily. What changed:  when to take this reasons to take this   Vitamin D3 125 MCG (5000 UT) Tabs Take 5,000 Units by mouth daily.       Allergies  Allergen Reactions   Donepezil Nausea And Vomiting and Other (See Comments)    Severe vomiting   Ciprofloxacin Other (See Comments)    Not listed on MAR   Latex Other (See Comments)    Not listed on MAR   Levofloxacin Other (See Comments)    Not listed on MAR   Macrobid [Nitrofurantoin Monohyd Macro] Other (See Comments)    Not listed on MAR   Nitrofurantoin Other (See Comments)    Not listed on MAR   Sulfa Antibiotics Other (See Comments)    Not listed on MAR   Sulfamethoxazole     Not listed on MAR      The results of significant diagnostics from this hospitalization (  including imaging, microbiology, ancillary and laboratory) are listed below for reference.    Significant Diagnostic Studies: ECHOCARDIOGRAM COMPLETE  Result Date: 06/23/2022    ECHOCARDIOGRAM REPORT   Patient Name:   Faith Parker Date of Exam: 06/23/2022 Medical Rec #:  [id number]      Height:       68.0 in Accession #:    [id number]     Weight:       196.2 lb Date of Birth:  [birth year]       BSA:          2.027 m Patient Age:    21 years       BP:           111/70 mmHg Patient Gender: F              HR:           81 bpm. Exam Location:  Inpatient Procedure: 2D Echo, Cardiac Doppler and Color Doppler Indications:    Acute respiratory distress  History:        Patient has prior history of Echocardiogram examinations, most                 recent 01/13/2022. Pacemaker, Arrythmias:Atrial Fibrillation; Risk                 Factors:Hypertension.  Sonographer:    Joette Catching RCS Referring Phys: [place]  1. Moderate hypertrophy of the basal septum with otherwise mild LVH. Left ventricular  ejection fraction, by estimation, is 60 to 65%. The left ventricle has normal function. The left ventricle has no regional wall motion abnormalities. There is moderate  asymmetric left ventricular hypertrophy. Left ventricular diastolic parameters are indeterminate. Elevated left ventricular end-diastolic pressure.  2. Right ventricular systolic function is normal. The right ventricular size is normal. There is mildly elevated pulmonary artery systolic pressure.  3. Left atrial size was severely dilated.  4. A small pericardial effusion is present. The pericardial effusion is posterior and lateral to the left ventricle. There is no evidence of cardiac tamponade. Moderate pleural effusion in the left lateral region.  5. The mitral valve is normal in structure. Trivial mitral valve regurgitation. No evidence of mitral stenosis.  6. The aortic valve is tricuspid. There is moderate calcification of the aortic valve. There is moderate thickening of the aortic valve. Aortic valve regurgitation is mild. Moderate aortic valve stenosis. Aortic valve area, by VTI measures 1.95 cm. Aortic valve mean gradient measures 22.0 mmHg. Aortic valve Vmax measures 2.93 m/s.  7. Aortic dilatation noted. There is mild dilatation of the aortic root, measuring 37 mm. There is moderate dilatation of the ascending aorta, measuring 46 mm.  8. The inferior vena cava is dilated in size with >50% respiratory variability, suggesting right atrial pressure of 8 mmHg. FINDINGS  Left Ventricle: Moderate hypertrophy of the basal septum with otherwise mild LVH. Left ventricular ejection fraction, by estimation, is 60 to 65%. The left ventricle has normal function. The left ventricle has no regional wall motion abnormalities. The left ventricular internal cavity size was normal in size. There is moderate asymmetric left ventricular hypertrophy. Left ventricular diastolic function could not be evaluated due to atrial fibrillation. Left ventricular  diastolic parameters are indeterminate. Elevated left ventricular end-diastolic pressure. Right Ventricle: The right ventricular size is normal. No increase in right ventricular wall thickness. Right ventricular systolic function is normal. There is mildly elevated pulmonary artery systolic pressure. The tricuspid regurgitant velocity is  2.98  m/s, and with an assumed right atrial pressure of 8 mmHg, the estimated right ventricular systolic pressure is 16.1 mmHg. Left Atrium: Left atrial size was severely dilated. Right Atrium: Right atrial size was normal in size. Pericardium: A small pericardial effusion is present. The pericardial effusion is posterior and lateral to the left ventricle. There is no evidence of cardiac tamponade. Mitral Valve: The mitral valve is normal in structure. Trivial mitral valve regurgitation. No evidence of mitral valve stenosis. Tricuspid Valve: The tricuspid valve is normal in structure. Tricuspid valve regurgitation is mild . No evidence of tricuspid stenosis. Aortic Valve: The aortic valve is tricuspid. There is moderate calcification of the aortic valve. There is moderate thickening of the aortic valve. Aortic valve regurgitation is mild. Aortic regurgitation PHT measures 705 msec. Moderate aortic stenosis is present. Aortic valve mean gradient measures 22.0 mmHg. Aortic valve peak gradient measures 34.3 mmHg. Aortic valve area, by VTI measures 1.95 cm. Pulmonic Valve: The pulmonic valve was normal in structure. Pulmonic valve regurgitation is trivial. No evidence of pulmonic stenosis. Aorta: Aortic dilatation noted. There is mild dilatation of the aortic root, measuring 37 mm. There is moderate dilatation of the ascending aorta, measuring 46 mm. Venous: The inferior vena cava is dilated in size with greater than 50% respiratory variability, suggesting right atrial pressure of 8 mmHg. IAS/Shunts: No atrial level shunt detected by color flow Doppler. Additional Comments: There is a  moderate pleural effusion in the left lateral region.  LEFT VENTRICLE PLAX 2D LVIDd:         3.95 cm   Diastology LVIDs:         2.25 cm   LV e' medial:    5.15 cm/s LV PW:         1.15 cm   LV E/e' medial:  24.3 LV IVS:        1.35 cm   LV e' lateral:   10.30 cm/s LVOT diam:     2.30 cm   LV E/e' lateral: 12.1 LV SV:         107 LV SV Index:   53 LVOT Area:     4.15 cm  RIGHT VENTRICLE            IVC RV Basal diam:  4.10 cm    IVC diam: 2.90 cm RV Mid diam:    2.30 cm RV S prime:     8.93 cm/s TAPSE (M-mode): 1.3 cm LEFT ATRIUM             Index        RIGHT ATRIUM           Index LA diam:        4.00 cm 1.97 cm/m   RA Area:     21.00 cm LA Vol (A2C):   74.1 ml 36.55 ml/m  RA Volume:   49.80 ml  24.57 ml/m LA Vol (A4C):   81.4 ml 40.15 ml/m LA Biplane Vol: 80.9 ml 39.91 ml/m  AORTIC VALVE AV Area (Vmax):    1.74 cm AV Area (Vmean):   1.83 cm AV Area (VTI):     1.95 cm AV Vmax:           293.00 cm/s AV Vmean:          205.500 cm/s AV VTI:            0.548 m AV Peak Grad:      34.3 mmHg AV Mean Grad:  22.0 mmHg LVOT Vmax:         122.50 cm/s LVOT Vmean:        90.300 cm/s LVOT VTI:          0.258 m LVOT/AV VTI ratio: 0.47 AI PHT:            705 msec  AORTA Ao Root diam: 3.65 cm Ao Asc diam:  4.60 cm MITRAL VALVE                TRICUSPID VALVE MV Area (PHT): 3.76 cm     TV Peak grad:   30.2 mmHg MV Decel Time: 202 msec     TV Vmax:        2.75 m/s MV E velocity: 125.00 cm/s  TR Peak grad:   35.5 mmHg MV A velocity: 31.50 cm/s   TR Vmax:        298.00 cm/s MV E/A ratio:  3.97                             SHUNTS                             Systemic VTI:  0.26 m                             Systemic Diam: 2.30 cm Skeet Latch MD Electronically signed by Skeet Latch MD Signature Date/Time: 06/23/2022/4:05:09 PM    Final    DG Chest Portable 1 View  Result Date: 06/22/2022 CLINICAL DATA:  Chest pain. EXAM: PORTABLE CHEST 1 VIEW COMPARISON:  12/08/2018. FINDINGS: Heart is enlarged and the  mediastinal contour is stable. Atherosclerotic calcification of the aorta is noted. A dual lead pacemaker is present over the left chest. Increased atelectasis or infiltrate is noted at the lung bases, greater on the left than on the right. There is a small left pleural effusion. No pneumothorax. No acute osseous abnormality. IMPRESSION: 1. Increased atelectasis or infiltrate at the lung bases, greater on the left than on the right. 2. Small left pleural effusion. Electronically Signed   By: Brett Fairy M.D.   On: 06/22/2022 04:50   CUP PACEART REMOTE DEVICE CHECK  Result Date: 06/11/2022 Scheduled remote reviewed. Normal device function.  AF burden 45%, becoming more persistent per trends. On [place] Next remote 91 days.   Microbiology: No results found for this or any previous visit (from the past 240 hour(s)).   Labs: Basic Metabolic Panel: Recent Labs  Lab 06/22/22 0435 06/24/22 0239 06/25/22 0201  NA 137 136 135  K 3.6 3.5 4.0  CL 102 102 101  CO2 '24 26 27  '$ GLUCOSE 141* 118* 97  BUN '21 21 15  '$ CREATININE 1.14* 1.49* 1.12*  CALCIUM 8.7* 8.2* 8.0*  MG 1.6* 2.2  --    Liver Function Tests: Recent Labs  Lab 06/22/22 0435  AST 27  ALT 26  ALKPHOS 56  BILITOT 0.4  PROT 6.1*  ALBUMIN 3.4*   Recent Labs  Lab 06/22/22 0435  LIPASE 37   No results for input(s): "AMMONIA" in the last 168 hours. CBC: Recent Labs  Lab 06/22/22 0435  WBC 8.4  NEUTROABS 7.2  HGB 9.5*  HCT 29.2*  MCV 84.6  PLT 208   Cardiac Enzymes: No results for input(s): "CKTOTAL", "CKMB", "CKMBINDEX", "TROPONINI" in the last 168 hours. BNP:  BNP (last 3 results) Recent Labs    12/08/21 1630 06/22/22 0435  BNP 577.3* 793.1*    ProBNP (last 3 results) Recent Labs    12/10/21 1641  PROBNP 697    CBG: No results for input(s): "GLUCAP" in the last 168 hours.     Signed:  Domenic Polite MD.  [facility] 06/25/2022, 12:22 PM

## 2022-06-25 NOTE — [place] FL2 (Signed)
[place] MEDICAID FL2 LEVEL OF CARE SCREENING TOOL     IDENTIFICATION  Patient Name: Faith Parker Birthdate: 1930/02/24 Sex: female Admission Date (Current Location): 06/22/2022  [facility] and [place] Number:  Herbalist and Address:  [facility] [place] [place] [address], [place], [place] [address]      Provider Number: [id number]  Attending Physician Name and Address:  Domenic Polite, MD  Relative Name and Phone Number:  [name] [phone]    Current Level of Care: Hospital Recommended Level of Care: [place] ([place]) Prior Approval Number:    Date Approved/Denied:   PASRR Number:    Discharge Plan: Other (Comment) ([facility])    Current Diagnoses: Patient Active Problem List   Diagnosis Date Noted   Acute on chronic diastolic CHF (congestive heart failure) ([place]) 06/22/2022   DNR (do not resuscitate) 06/22/2022   Urinary frequency 03/01/2022   CKD (chronic kidney disease) stage 3, GFR 30-59 ml/min ([place]) 02/07/2022   Hyperlipidemia 02/07/2022   Osteoarthritis, multiple sites 02/07/2022   Peripheral edema 02/07/2022   Recurrent depression ([place]) 02/07/2022   Blind left eye 02/07/2022   Abnormal nuclear cardiac imaging test    DOE (dyspnea on exertion) 10/17/2019   ARF (acute renal failure) ([place]) 10/17/2019   Acute lower UTI 10/17/2019   Acute cystitis without hematuria    PAF (paroxysmal atrial fibrillation) ([place]) 06/05/2018   Chronic anticoagulation 06/05/2018   Chronic UTI 06/05/2018   Cardiac device in situ    Chest pain of uncertain etiology    Bradycardia 06/03/2018   Thoracic aortic aneurysm [facility]): a. 4.1 cm ascending thoracic aortic aneurysm 01/23/17 01/23/2017   Syncope 01/21/2017   Hypertension 01/21/2017   Hypothyroidism 01/21/2017    Orientation RESPIRATION BLADDER Height & Weight     Self, Place, Situation  Normal Continent, External catheter (External Urinary Catheter) Weight:  207 lb 0.2 oz (93.9 kg) Height:  '5\' 8"'$  (172.7 cm)  BEHAVIORAL SYMPTOMS/MOOD NEUROLOGICAL BOWEL NUTRITION STATUS      Continent (WDL) Diet (Please see discharge summary)  AMBULATORY STATUS COMMUNICATION OF NEEDS Skin     Verbally Other (Comment) (Appropriate for ethnicity,dry,Ecchymosis,arm,Leg,Right)                       Personal Care Assistance Level of Assistance  Bathing, Feeding, Dressing Bathing Assistance: Limited assistance Feeding assistance: Independent (able to feed self) Dressing Assistance: Limited assistance     Functional Limitations Info  Sight, Hearing, Speech Sight Info: Impaired (Blind Left Eye) Hearing Info: Adequate Speech Info: Adequate    SPECIAL CARE FACTORS FREQUENCY  PT (By licensed PT), OT (By licensed OT)     PT Frequency: 3x min weekly OT Frequency: 3x min weekly            Contractures Contractures Info: Not present    Additional Factors Info  Code Status, Allergies, Psychotropic Code Status Info: DNR Allergies Info: Donepezil,Ciprofloxacin,Latex,Levofloxacin,Macrobid (nitrofurantoin Monohyd Macro),Nitrofurantoin,Sulfa Antibiotics,Sulfamethoxazole Psychotropic Info: FLUoxetine (PROZAC) capsule 20 mg daily,FLUoxetine (PROZAC) capsule 40 mg every morning,             Discharge Medications: amiodarone 200 MG tablet Commonly known as: PACERONE Take 1 tablet (200 mg total) by mouth 2 (two) times daily.    apixaban 5 MG Tabs tablet Commonly known as: ELIQUIS Take 5 mg by mouth 2 (two) times daily.    atenolol 25 MG tablet Commonly known as: TENORMIN Take 25 mg by mouth daily.  cephALEXin 250 MG capsule Commonly known as: KEFLEX Take 250 mg by mouth daily.    FLUoxetine 20 MG capsule Commonly known as: PROZAC Take 20 mg by mouth daily.    FLUoxetine 40 MG capsule Commonly known as: PROZAC Take 40 mg by mouth in the morning.    furosemide 20 MG tablet Commonly known as: LASIX Take 1 tablet (20 mg total) by mouth  daily. Take 20 mg by mouth on Monday, Tuesday, Wednesday, Thursday, Friday, and Saturday What changed: when to take this    levothyroxine 88 MCG tablet Commonly known as: SYNTHROID Take 88 mcg by mouth daily.    losartan 25 MG tablet Commonly known as: COZAAR Take 37.5 mg by mouth daily.    nystatin powder Generic drug: nystatin Apply 1 Application topically 2 (two) times daily. For 3 weeks    Omega-3 Fish Oil 1200 MG Caps Take 2,400 mg by mouth daily.    omeprazole 20 MG tablet Commonly known as: PRILOSEC OTC Take 20 mg by mouth daily.    rosuvastatin 20 MG tablet Commonly known as: CRESTOR TAKE ONE TABLET EVERY EVENING What changed: when to take this    Systane 0.4-0.3 % Gel ophthalmic gel Generic drug: Polyethyl Glycol-Propyl Glycol Place 1 application into both eyes at bedtime.    traMADol 50 MG tablet Commonly known as: ULTRAM Take 1 tablet (50 mg total) by mouth daily. What changed:  when to take this reasons to take this    Vitamin D3 125 MCG (5000 UT) Tabs Take 5,000 Units by mouth daily.      Relevant Imaging Results:  Relevant Lab Results:   Additional Information [id number]  Bary Castilla, LCSW

## 2022-06-27 NOTE — Addendum Note (Signed)
Addended by: Durenda Age C on: 06/27/2022 09:16 AM   Modules accepted: Level of Service

## 2022-06-28 ENCOUNTER — Encounter: Payer: Self-pay | Admitting: Internal Medicine

## 2022-06-28 ENCOUNTER — Non-Acute Institutional Stay: Payer: Medicare Other | Admitting: Internal Medicine

## 2022-06-28 DIAGNOSIS — R531 Weakness: Secondary | ICD-10-CM

## 2022-06-28 DIAGNOSIS — I10 Essential (primary) hypertension: Secondary | ICD-10-CM

## 2022-06-28 DIAGNOSIS — I480 Paroxysmal atrial fibrillation: Secondary | ICD-10-CM

## 2022-06-28 DIAGNOSIS — R110 Nausea: Secondary | ICD-10-CM | POA: Diagnosis not present

## 2022-06-28 DIAGNOSIS — N1831 Chronic kidney disease, stage 3a: Secondary | ICD-10-CM

## 2022-06-28 DIAGNOSIS — I5043 Acute on chronic combined systolic (congestive) and diastolic (congestive) heart failure: Secondary | ICD-10-CM | POA: Diagnosis not present

## 2022-06-28 DIAGNOSIS — F339 Major depressive disorder, recurrent, unspecified: Secondary | ICD-10-CM

## 2022-06-28 DIAGNOSIS — E039 Hypothyroidism, unspecified: Secondary | ICD-10-CM

## 2022-06-28 DIAGNOSIS — E785 Hyperlipidemia, unspecified: Secondary | ICD-10-CM

## 2022-06-28 NOTE — Progress Notes (Unsigned)
Location:   [facility] Room Number: [place]:  ALF [phone]) Provider:  Veleta Miners MD  Mast, Man X, NP  Patient Care Team: Mast, Man X, NP as PCP - General (Internal Medicine) Fay Records, MD as PCP - Cardiology (Cardiology)  Extended Emergency Contact Information Primary Emergency Contact: May,Sharon Address: [address]           [place], [place] [address] [name] of [place] Phone: [phone] Mobile Phone: [phone] Relation: Daughter Secondary Emergency Contact: Henderson,Miriam Address: [address]          [place], [place] [address] [name] of [place] Phone: [phone] Mobile Phone: [phone] Relation: Daughter  Code Status:  DNR Goals of care: Advanced Directive information    06/28/2022   12:05 PM  Advanced Directives  Does Patient Have a Medical Advance Directive? Yes  Type of Advance Directive Out of facility DNR (pink MOST or yellow form);[place];Living will  Does patient want to make changes to medical advance directive? No - Patient declined  Copy of [place] in Chart? Yes - validated most recent copy scanned in chart (See row information)  Pre-existing out of facility DNR order (yellow form or pink MOST form) Pink Most/Yellow Form available - Physician notified to receive inpatient order     Chief Complaint  Patient presents with   Acute Visit    HPI:  Pt is a 88 y.o. female seen today for an acute visit for Hospital Follow up  Lives in [place]  Patient has h/o hypertension, aortic stenosis, hypothyroidism She also has a history of A-fib echo with mild AS S/p PPM CHF hypothyroidism History of recurrent UTI on suppressive therapy  depression Osteoporosis sees Dr. 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  Past Medical History:  Diagnosis Date   Anxiety     Aortic regurgitation    Atrial fibrillation (HCC)    Chest pain    Per new patient form   Gastritis    GERD (gastroesophageal reflux disease)    H/O artificial eye lens    Hypertension    Hypothyroid    Left ventricular hypertrophy    Major depression    Osteoarthritis    Osteoporosis    Pacemaker    On Eliquis, Per new patient form   Peptic ulcer disease    Stress incontinence    Past Surgical History:  Procedure Laterality Date   ABDOMINAL SURGERY     Per new patient form   BLADDER SURGERY     DG  BONE DENSITY ([place] HX)     Per new patient form   EYE SURGERY     KNEE ARTHROPLASTY     LEFT HEART CATH AND CORONARY ANGIOGRAPHY N/A 10/22/2019   Procedure: LEFT HEART CATH AND CORONARY ANGIOGRAPHY;  Surgeon: Sherren Mocha, MD;  Location: [facility];  Service: Cardiovascular;  Laterality: N/A;   LEG SURGERY Right    multiple surgeries, unknown type   PACEMAKER IMPLANT N/A 06/04/2018   Procedure: PACEMAKER IMPLANT;  Surgeon: Deboraha Sprang, MD;  Location: [facility];  Service: Cardiovascular;  Laterality: N/A;    Allergies  Allergen Reactions   Donepezil Nausea And Vomiting and Other (See Comments)    Severe vomiting   Ciprofloxacin Other (See Comments)    Not listed on MAR   Latex Other (See Comments)  Not listed on MAR   Levofloxacin Other (See Comments)    Not listed on MAR   Macrobid [Nitrofurantoin Monohyd Macro] Other (See Comments)    Not listed on MAR   Nitrofurantoin Other (See Comments)    Not listed on MAR   Sulfa Antibiotics Other (See Comments)    Not listed on MAR   Sulfamethoxazole     Not listed on MAR    Allergies as of 06/28/2022       Reactions   Donepezil Nausea And Vomiting, Other (See Comments)   Severe vomiting   Ciprofloxacin Other (See Comments)   Not listed on MAR   Latex Other (See Comments)   Not listed on MAR   Levofloxacin Other (See Comments)   Not listed on MAR   Macrobid [nitrofurantoin Monohyd Macro]  Other (See Comments)   Not listed on MAR   Nitrofurantoin Other (See Comments)   Not listed on MAR   Sulfa Antibiotics Other (See Comments)   Not listed on MAR   Sulfamethoxazole    Not listed on [facility]        Medication List        Accurate as of June 28, 2022 12:06 PM. If you have any questions, ask your nurse or doctor.          acetaminophen 325 MG tablet Commonly known as: TYLENOL Take 650 mg by mouth every 4 (four) hours as needed.   alum & mag hydroxide-simeth 200-200-20 MG/5ML suspension Commonly known as: MAALOX/MYLANTA Take by mouth every 4 (four) hours as needed for indigestion or heartburn.   amiodarone 200 MG tablet Commonly known as: PACERONE Take 1 tablet (200 mg total) by mouth 2 (two) times daily.   apixaban 5 MG Tabs tablet Commonly known as: ELIQUIS Take 5 mg by mouth 2 (two) times daily.   atenolol 25 MG tablet Commonly known as: TENORMIN Take 25 mg by mouth daily.   cephALEXin 250 MG capsule Commonly known as: KEFLEX Take 250 mg by mouth daily.   FLUoxetine 20 MG capsule Commonly known as: PROZAC Take 20 mg by mouth daily.   FLUoxetine 40 MG capsule Commonly known as: PROZAC Take 40 mg by mouth in the morning.   furosemide 20 MG tablet Commonly known as: LASIX Take 1 tablet (20 mg total) by mouth daily. Take 20 mg by mouth on Monday, Tuesday, Wednesday, Thursday, Friday, and Saturday   levothyroxine 88 MCG tablet Commonly known as: SYNTHROID Take 88 mcg by mouth daily.   losartan 25 MG tablet Commonly known as: COZAAR Take 37.5 mg by mouth daily.   nystatin powder Generic drug: nystatin Apply 1 Application topically 2 (two) times daily. For 3 weeks   Omega-3 Fish Oil 1200 MG Caps Take 2,400 mg by mouth daily.   omeprazole 20 MG tablet Commonly known as: PRILOSEC OTC Take 20 mg by mouth daily.   rosuvastatin 20 MG tablet Commonly known as: CRESTOR TAKE ONE TABLET EVERY EVENING   Systane 0.4-0.3 % Gel ophthalmic  gel Generic drug: Polyethyl Glycol-Propyl Glycol Place 1 application into both eyes at bedtime.   traMADol 50 MG tablet Commonly known as: ULTRAM Take 1 tablet (50 mg total) by mouth daily.   Vitamin D3 125 MCG (5000 UT) Tabs Take 5,000 Units by mouth daily.        Review of Systems  Immunization History  Administered Date(s) Administered   Influenza Split 09/13/2017, 09/06/2018, 09/18/2019   Influenza, High Dose Seasonal PF 09/04/2021   Moderna  SARS-COV2 Booster Vaccination 10/13/2020   PFIZER(Purple Top)SARS-COV-2 Vaccination 12/16/2019, 01/16/2020   PNEUMOCOCCAL CONJUGATE-20 12/23/2021   Pertinent  Health Maintenance Due  Topic Date Due   INFLUENZA VACCINE  07/05/2022   DEXA SCAN  Completed      06/23/2022    8:25 AM 06/23/2022    9:00 PM 06/24/2022    8:10 AM 06/24/2022    8:57 PM 06/25/2022    8:00 AM  Fall Risk  Patient Fall Risk Level High fall risk High fall risk High fall risk High fall risk High fall risk   Functional Status Survey:    Vitals:   06/28/22 1158  BP: 120/76  Pulse: 90  Resp: 16  Temp: 98.4 F (36.9 C)  SpO2: 92%  Weight: 208 lb 12.8 oz (94.7 kg)  Height: '5\' 8"'$  (1.727 m)   Body mass index is 31.75 kg/m. Physical Exam  Labs reviewed: Recent Labs    06/22/22 0435 06/24/22 0239 06/25/22 0201  NA 137 136 135  K 3.6 3.5 4.0  CL 102 102 101  CO2 '24 26 27  '$ GLUCOSE 141* 118* 97  BUN '21 21 15  '$ CREATININE 1.14* 1.49* 1.12*  CALCIUM 8.7* 8.2* 8.0*  MG 1.6* 2.2  --    Recent Labs    12/08/21 1630 06/22/22 0435  AST 24 27  ALT 24 26  ALKPHOS 52 56  BILITOT 0.6 0.4  PROT 6.0* 6.1*  ALBUMIN 3.5 3.4*   Recent Labs    12/08/21 1122 02/14/22 1628 05/26/22 1610 06/22/22 0435  WBC 6.6 7.3 11.6 8.4  NEUTROABS  --   --  10,463.00 7.2  HGB 11.6* 10.8* 9.6* 9.5*  HCT 35.7* 33.3* 31* 29.2*  MCV 89.5 89  --  84.6  PLT 223 233  --  208   Lab Results  Component Value Date   TSH 0.23 (A) 03/31/2022   No results found for:  "HGBA1C" Lab Results  Component Value Date   CHOL 138 02/08/2022   HDL 71 (A) 02/08/2022   LDLCALC 55 02/08/2022   TRIG 42 02/08/2022    Significant Diagnostic Results in last 30 days:  ECHOCARDIOGRAM COMPLETE  Result Date: 06/23/2022    ECHOCARDIOGRAM REPORT   Patient Name:   Faith Parker Date of Exam: 06/23/2022 Medical Rec #:  [id number]      Height:       68.0 in Accession #:    [id number]     Weight:       196.2 lb Date of Birth:  25-Dec-1929       BSA:          2.027 m Patient Age:    89 years       BP:           111/70 mmHg Patient Gender: F              HR:           81 bpm. Exam Location:  Inpatient Procedure: 2D Echo, Cardiac Doppler and Color Doppler Indications:    Acute respiratory distress  History:        Patient has prior history of Echocardiogram examinations, most                 recent 01/13/2022. Pacemaker, Arrythmias:Atrial Fibrillation; Risk                 Factors:Hypertension.  Sonographer:    Joette Catching RCS Referring Phys: Terminous  1. Moderate hypertrophy  of the basal septum with otherwise mild LVH. Left ventricular ejection fraction, by estimation, is 60 to 65%. The left ventricle has normal function. The left ventricle has no regional wall motion abnormalities. There is moderate  asymmetric left ventricular hypertrophy. Left ventricular diastolic parameters are indeterminate. Elevated left ventricular end-diastolic pressure.  2. Right ventricular systolic function is normal. The right ventricular size is normal. There is mildly elevated pulmonary artery systolic pressure.  3. Left atrial size was severely dilated.  4. A small pericardial effusion is present. The pericardial effusion is posterior and lateral to the left ventricle. There is no evidence of cardiac tamponade. Moderate pleural effusion in the left lateral region.  5. The mitral valve is normal in structure. Trivial mitral valve regurgitation. No evidence of mitral stenosis.  6. The aortic  valve is tricuspid. There is moderate calcification of the aortic valve. There is moderate thickening of the aortic valve. Aortic valve regurgitation is mild. Moderate aortic valve stenosis. Aortic valve area, by VTI measures 1.95 cm. Aortic valve mean gradient measures 22.0 mmHg. Aortic valve Vmax measures 2.93 m/s.  7. Aortic dilatation noted. There is mild dilatation of the aortic root, measuring 37 mm. There is moderate dilatation of the ascending aorta, measuring 46 mm.  8. The inferior vena cava is dilated in size with >50% respiratory variability, suggesting right atrial pressure of 8 mmHg. FINDINGS  Left Ventricle: Moderate hypertrophy of the basal septum with otherwise mild LVH. Left ventricular ejection fraction, by estimation, is 60 to 65%. The left ventricle has normal function. The left ventricle has no regional wall motion abnormalities. The left ventricular internal cavity size was normal in size. There is moderate asymmetric left ventricular hypertrophy. Left ventricular diastolic function could not be evaluated due to atrial fibrillation. Left ventricular diastolic parameters are indeterminate. Elevated left ventricular end-diastolic pressure. Right Ventricle: The right ventricular size is normal. No increase in right ventricular wall thickness. Right ventricular systolic function is normal. There is mildly elevated pulmonary artery systolic pressure. The tricuspid regurgitant velocity is 2.98  m/s, and with an assumed right atrial pressure of 8 mmHg, the estimated right ventricular systolic pressure is 96.7 mmHg. Left Atrium: Left atrial size was severely dilated. Right Atrium: Right atrial size was normal in size. Pericardium: A small pericardial effusion is present. The pericardial effusion is posterior and lateral to the left ventricle. There is no evidence of cardiac tamponade. Mitral Valve: The mitral valve is normal in structure. Trivial mitral valve regurgitation. No evidence of mitral  valve stenosis. Tricuspid Valve: The tricuspid valve is normal in structure. Tricuspid valve regurgitation is mild . No evidence of tricuspid stenosis. Aortic Valve: The aortic valve is tricuspid. There is moderate calcification of the aortic valve. There is moderate thickening of the aortic valve. Aortic valve regurgitation is mild. Aortic regurgitation PHT measures 705 msec. Moderate aortic stenosis is present. Aortic valve mean gradient measures 22.0 mmHg. Aortic valve peak gradient measures 34.3 mmHg. Aortic valve area, by VTI measures 1.95 cm. Pulmonic Valve: The pulmonic valve was normal in structure. Pulmonic valve regurgitation is trivial. No evidence of pulmonic stenosis. Aorta: Aortic dilatation noted. There is mild dilatation of the aortic root, measuring 37 mm. There is moderate dilatation of the ascending aorta, measuring 46 mm. Venous: The inferior vena cava is dilated in size with greater than 50% respiratory variability, suggesting right atrial pressure of 8 mmHg. IAS/Shunts: No atrial level shunt detected by color flow Doppler. Additional Comments: There is a moderate pleural effusion in  the left lateral region.  LEFT VENTRICLE PLAX 2D LVIDd:         3.95 cm   Diastology LVIDs:         2.25 cm   LV e' medial:    5.15 cm/s LV PW:         1.15 cm   LV E/e' medial:  24.3 LV IVS:        1.35 cm   LV e' lateral:   10.30 cm/s LVOT diam:     2.30 cm   LV E/e' lateral: 12.1 LV SV:         107 LV SV Index:   53 LVOT Area:     4.15 cm  RIGHT VENTRICLE            IVC RV Basal diam:  4.10 cm    IVC diam: 2.90 cm RV Mid diam:    2.30 cm RV S prime:     8.93 cm/s TAPSE (M-mode): 1.3 cm LEFT ATRIUM             Index        RIGHT ATRIUM           Index LA diam:        4.00 cm 1.97 cm/m   RA Area:     21.00 cm LA Vol (A2C):   74.1 ml 36.55 ml/m  RA Volume:   49.80 ml  24.57 ml/m LA Vol (A4C):   81.4 ml 40.15 ml/m LA Biplane Vol: 80.9 ml 39.91 ml/m  AORTIC VALVE AV Area (Vmax):    1.74 cm AV Area (Vmean):    1.83 cm AV Area (VTI):     1.95 cm AV Vmax:           293.00 cm/s AV Vmean:          205.500 cm/s AV VTI:            0.548 m AV Peak Grad:      34.3 mmHg AV Mean Grad:      22.0 mmHg LVOT Vmax:         122.50 cm/s LVOT Vmean:        90.300 cm/s LVOT VTI:          0.258 m LVOT/AV VTI ratio: 0.47 AI PHT:            705 msec  AORTA Ao Root diam: 3.65 cm Ao Asc diam:  4.60 cm MITRAL VALVE                TRICUSPID VALVE MV Area (PHT): 3.76 cm     TV Peak grad:   30.2 mmHg MV Decel Time: 202 msec     TV Vmax:        2.75 m/s MV E velocity: 125.00 cm/s  TR Peak grad:   35.5 mmHg MV A velocity: 31.50 cm/s   TR Vmax:        298.00 cm/s MV E/A ratio:  3.97                             SHUNTS                             Systemic VTI:  0.26 m  Systemic Diam: 2.30 cm Skeet Latch MD Electronically signed by Skeet Latch MD Signature Date/Time: 06/23/2022/4:05:09 PM    Final    DG Chest Portable 1 View  Result Date: 06/22/2022 CLINICAL DATA:  Chest pain. EXAM: PORTABLE CHEST 1 VIEW COMPARISON:  12/08/2018. FINDINGS: Heart is enlarged and the mediastinal contour is stable. Atherosclerotic calcification of the aorta is noted. A dual lead pacemaker is present over the left chest. Increased atelectasis or infiltrate is noted at the lung bases, greater on the left than on the right. There is a small left pleural effusion. No pneumothorax. No acute osseous abnormality. IMPRESSION: 1. Increased atelectasis or infiltrate at the lung bases, greater on the left than on the right. 2. Small left pleural effusion. Electronically Signed   By: Brett Fairy M.D.   On: 06/22/2022 04:50   CUP PACEART REMOTE DEVICE CHECK  Result Date: 06/11/2022 Scheduled remote reviewed. Normal device function.  AF burden 45%, becoming more persistent per trends. On [place] Next remote 91 days.   Assessment/Plan 1. PAF (paroxysmal atrial fibrillation) ([place]) Now on Amiodarone Follow with [facility]  2. Acute on chronic  combined systolic and diastolic CHF (congestive heart failure) (HCC) Daily weights On lasix  3. Nausea Better can be diue to Amiodarome Will follow  4. Generalized weakness Therapy  5. Primary hypertension Better on Cozaar and Lopressor  6. Stage 3a chronic kidney disease (HCC) ***  7. Hypothyroidism, unspecified type Repeat TSH dose was changed for Low Tsh on 04/23  8. Hyperlipidemia, unspecified hyperlipidemia type ***  9. Recurrent depression ([place]) *** 10 Anemia Iron studies Ferritin B12     Family/ staff Communication:   Labs/tests ordered:

## 2022-06-30 ENCOUNTER — Non-Acute Institutional Stay: Payer: Medicare Other | Admitting: Nurse Practitioner

## 2022-06-30 ENCOUNTER — Emergency Department (HOSPITAL_COMMUNITY): Payer: Medicare Other

## 2022-06-30 ENCOUNTER — Other Ambulatory Visit: Payer: Self-pay

## 2022-06-30 ENCOUNTER — Inpatient Hospital Stay (HOSPITAL_COMMUNITY)
Admission: EM | Admit: 2022-06-30 | Discharge: 2022-07-04 | DRG: 314 | Disposition: A | Discharge status: Skilled Nursing Facility | Payer: Medicare Other | Source: Skilled Nursing Facility | Attending: Internal Medicine | Admitting: Internal Medicine

## 2022-06-30 ENCOUNTER — Encounter (HOSPITAL_COMMUNITY): Payer: Self-pay | Admitting: Emergency Medicine

## 2022-06-30 DIAGNOSIS — Z91040 Latex allergy status: Secondary | ICD-10-CM

## 2022-06-30 DIAGNOSIS — I4819 Other persistent atrial fibrillation: Secondary | ICD-10-CM | POA: Diagnosis present

## 2022-06-30 DIAGNOSIS — N1831 Chronic kidney disease, stage 3a: Secondary | ICD-10-CM

## 2022-06-30 DIAGNOSIS — Z7901 Long term (current) use of anticoagulants: Secondary | ICD-10-CM | POA: Diagnosis not present

## 2022-06-30 DIAGNOSIS — I480 Paroxysmal atrial fibrillation: Secondary | ICD-10-CM | POA: Diagnosis not present

## 2022-06-30 DIAGNOSIS — D649 Anemia, unspecified: Secondary | ICD-10-CM

## 2022-06-30 DIAGNOSIS — E871 Hypo-osmolality and hyponatremia: Secondary | ICD-10-CM | POA: Diagnosis present

## 2022-06-30 DIAGNOSIS — M810 Age-related osteoporosis without current pathological fracture: Secondary | ICD-10-CM | POA: Diagnosis present

## 2022-06-30 DIAGNOSIS — F339 Major depressive disorder, recurrent, unspecified: Secondary | ICD-10-CM | POA: Diagnosis not present

## 2022-06-30 DIAGNOSIS — Z79890 Hormone replacement therapy (postmenopausal): Secondary | ICD-10-CM

## 2022-06-30 DIAGNOSIS — E039 Hypothyroidism, unspecified: Secondary | ICD-10-CM | POA: Diagnosis present

## 2022-06-30 DIAGNOSIS — I4891 Unspecified atrial fibrillation: Secondary | ICD-10-CM | POA: Diagnosis not present

## 2022-06-30 DIAGNOSIS — D72829 Elevated white blood cell count, unspecified: Secondary | ICD-10-CM | POA: Diagnosis not present

## 2022-06-30 DIAGNOSIS — Z806 Family history of leukemia: Secondary | ICD-10-CM

## 2022-06-30 DIAGNOSIS — Z950 Presence of cardiac pacemaker: Secondary | ICD-10-CM | POA: Diagnosis not present

## 2022-06-30 DIAGNOSIS — E8770 Fluid overload, unspecified: Secondary | ICD-10-CM | POA: Diagnosis not present

## 2022-06-30 DIAGNOSIS — F329 Major depressive disorder, single episode, unspecified: Secondary | ICD-10-CM | POA: Diagnosis present

## 2022-06-30 DIAGNOSIS — Z96659 Presence of unspecified artificial knee joint: Secondary | ICD-10-CM | POA: Diagnosis present

## 2022-06-30 DIAGNOSIS — I314 Cardiac tamponade: Secondary | ICD-10-CM | POA: Diagnosis present

## 2022-06-30 DIAGNOSIS — Z8261 Family history of arthritis: Secondary | ICD-10-CM

## 2022-06-30 DIAGNOSIS — Z87440 Personal history of urinary (tract) infections: Secondary | ICD-10-CM | POA: Diagnosis not present

## 2022-06-30 DIAGNOSIS — F419 Anxiety disorder, unspecified: Secondary | ICD-10-CM | POA: Diagnosis present

## 2022-06-30 DIAGNOSIS — I300 Acute nonspecific idiopathic pericarditis: Secondary | ICD-10-CM | POA: Diagnosis not present

## 2022-06-30 DIAGNOSIS — K862 Cyst of pancreas: Secondary | ICD-10-CM | POA: Diagnosis present

## 2022-06-30 DIAGNOSIS — R7982 Elevated C-reactive protein (CRP): Secondary | ICD-10-CM | POA: Diagnosis present

## 2022-06-30 DIAGNOSIS — K219 Gastro-esophageal reflux disease without esophagitis: Secondary | ICD-10-CM | POA: Diagnosis present

## 2022-06-30 DIAGNOSIS — D631 Anemia in chronic kidney disease: Secondary | ICD-10-CM | POA: Diagnosis present

## 2022-06-30 DIAGNOSIS — I10 Essential (primary) hypertension: Secondary | ICD-10-CM

## 2022-06-30 DIAGNOSIS — I3139 Other pericardial effusion (noninflammatory): Secondary | ICD-10-CM

## 2022-06-30 DIAGNOSIS — E876 Hypokalemia: Secondary | ICD-10-CM | POA: Diagnosis not present

## 2022-06-30 DIAGNOSIS — E785 Hyperlipidemia, unspecified: Secondary | ICD-10-CM | POA: Diagnosis present

## 2022-06-30 DIAGNOSIS — N390 Urinary tract infection, site not specified: Secondary | ICD-10-CM

## 2022-06-30 DIAGNOSIS — Z8249 Family history of ischemic heart disease and other diseases of the circulatory system: Secondary | ICD-10-CM

## 2022-06-30 DIAGNOSIS — I351 Nonrheumatic aortic (valve) insufficiency: Secondary | ICD-10-CM | POA: Diagnosis present

## 2022-06-30 DIAGNOSIS — I495 Sick sinus syndrome: Secondary | ICD-10-CM | POA: Diagnosis present

## 2022-06-30 DIAGNOSIS — Z79899 Other long term (current) drug therapy: Secondary | ICD-10-CM

## 2022-06-30 DIAGNOSIS — I309 Acute pericarditis, unspecified: Principal | ICD-10-CM | POA: Diagnosis present

## 2022-06-30 DIAGNOSIS — I130 Hypertensive heart and chronic kidney disease with heart failure and stage 1 through stage 4 chronic kidney disease, or unspecified chronic kidney disease: Secondary | ICD-10-CM | POA: Diagnosis present

## 2022-06-30 DIAGNOSIS — R609 Edema, unspecified: Secondary | ICD-10-CM

## 2022-06-30 DIAGNOSIS — M159 Polyosteoarthritis, unspecified: Secondary | ICD-10-CM

## 2022-06-30 DIAGNOSIS — Z66 Do not resuscitate: Secondary | ICD-10-CM | POA: Diagnosis present

## 2022-06-30 DIAGNOSIS — R112 Nausea with vomiting, unspecified: Secondary | ICD-10-CM | POA: Diagnosis present

## 2022-06-30 DIAGNOSIS — R1013 Epigastric pain: Secondary | ICD-10-CM | POA: Diagnosis not present

## 2022-06-30 DIAGNOSIS — I319 Disease of pericardium, unspecified: Secondary | ICD-10-CM | POA: Diagnosis present

## 2022-06-30 DIAGNOSIS — Z881 Allergy status to other antibiotic agents status: Secondary | ICD-10-CM

## 2022-06-30 DIAGNOSIS — Z882 Allergy status to sulfonamides status: Secondary | ICD-10-CM

## 2022-06-30 DIAGNOSIS — Z888 Allergy status to other drugs, medicaments and biological substances status: Secondary | ICD-10-CM

## 2022-06-30 DIAGNOSIS — N183 Chronic kidney disease, stage 3 unspecified: Secondary | ICD-10-CM | POA: Diagnosis present

## 2022-06-30 DIAGNOSIS — M1990 Unspecified osteoarthritis, unspecified site: Secondary | ICD-10-CM | POA: Diagnosis present

## 2022-06-30 DIAGNOSIS — Z8711 Personal history of peptic ulcer disease: Secondary | ICD-10-CM

## 2022-06-30 DIAGNOSIS — I5033 Acute on chronic diastolic (congestive) heart failure: Secondary | ICD-10-CM | POA: Diagnosis present

## 2022-06-30 LAB — COMPREHENSIVE METABOLIC PANEL
ALT: 50 U/L — ABNORMAL HIGH (ref 0–44)
AST: 48 U/L — ABNORMAL HIGH (ref 15–41)
Albumin: 3 g/dL — ABNORMAL LOW (ref 3.5–5.0)
Alkaline Phosphatase: 88 U/L (ref 38–126)
Anion gap: 10 (ref 5–15)
BUN: 16 mg/dL (ref 8–23)
CO2: 23 mmol/L (ref 22–32)
Calcium: 8.6 mg/dL — ABNORMAL LOW (ref 8.9–10.3)
Chloride: 99 mmol/L (ref 98–111)
Creatinine, Ser: 1.39 mg/dL — ABNORMAL HIGH (ref 0.44–1.00)
GFR, Estimated: 36 mL/min — ABNORMAL LOW (ref >60)
Glucose, Bld: 165 mg/dL — ABNORMAL HIGH (ref 70–99)
Potassium: 3.6 mmol/L (ref 3.5–5.1)
Sodium: 132 mmol/L — ABNORMAL LOW (ref 135–145)
Total Bilirubin: 0.8 mg/dL (ref 0.3–1.2)
Total Protein: 6.1 g/dL — ABNORMAL LOW (ref 6.5–8.1)

## 2022-06-30 LAB — ECHOCARDIOGRAM LIMITED
Height: 70 in
Weight: 3200 oz

## 2022-06-30 LAB — URINALYSIS, ROUTINE W REFLEX MICROSCOPIC
Bacteria, UA: NONE SEEN
Bilirubin Urine: NEGATIVE
Glucose, UA: NEGATIVE mg/dL
Hgb urine dipstick: NEGATIVE
Ketones, ur: NEGATIVE mg/dL
Leukocytes,Ua: NEGATIVE
Nitrite: NEGATIVE
Protein, ur: 30 mg/dL — AB
Specific Gravity, Urine: 1.02 (ref 1.005–1.030)
pH: 6 (ref 5.0–8.0)

## 2022-06-30 LAB — CBC WITH DIFFERENTIAL/PLATELET
Abs Immature Granulocytes: 0.12 10*3/uL — ABNORMAL HIGH (ref 0.00–0.07)
Basophils Absolute: 0 10*3/uL (ref 0.0–0.1)
Basophils Relative: 0 %
Eosinophils Absolute: 0 10*3/uL (ref 0.0–0.5)
Eosinophils Relative: 0 %
HCT: 31.3 % — ABNORMAL LOW (ref 36.0–46.0)
Hemoglobin: 10.2 g/dL — ABNORMAL LOW (ref 12.0–15.0)
Immature Granulocytes: 1 %
Lymphocytes Relative: 3 %
Lymphs Abs: 0.5 10*3/uL — ABNORMAL LOW (ref 0.7–4.0)
MCH: 26.8 pg (ref 26.0–34.0)
MCHC: 32.6 g/dL (ref 30.0–36.0)
MCV: 82.4 fL (ref 80.0–100.0)
Monocytes Absolute: 1.5 10*3/uL — ABNORMAL HIGH (ref 0.1–1.0)
Monocytes Relative: 8 %
Neutro Abs: 16 10*3/uL — ABNORMAL HIGH (ref 1.7–7.7)
Neutrophils Relative %: 88 %
Platelets: 383 10*3/uL (ref 150–400)
RBC: 3.8 MIL/uL — ABNORMAL LOW (ref 3.87–5.11)
RDW: 15.9 % — ABNORMAL HIGH (ref 11.5–15.5)
WBC: 18.1 10*3/uL — ABNORMAL HIGH (ref 4.0–10.5)
nRBC: 0 % (ref 0.0–0.2)

## 2022-06-30 LAB — TROPONIN I (HIGH SENSITIVITY)
Troponin I (High Sensitivity): 9 ng/L (ref <18)
Troponin I (High Sensitivity): 9 ng/L (ref <18)

## 2022-06-30 LAB — BRAIN NATRIURETIC PEPTIDE: B Natriuretic Peptide: 831.2 pg/mL — ABNORMAL HIGH (ref 0.0–100.0)

## 2022-06-30 LAB — POC OCCULT BLOOD, ED: Fecal Occult Bld: NEGATIVE

## 2022-06-30 LAB — SEDIMENTATION RATE: Sed Rate: 52 mm/hr — ABNORMAL HIGH (ref 0–22)

## 2022-06-30 LAB — C-REACTIVE PROTEIN: CRP: 19.6 mg/dL — ABNORMAL HIGH (ref <1.0)

## 2022-06-30 LAB — LIPASE, BLOOD: Lipase: 23 U/L (ref 11–51)

## 2022-06-30 LAB — TSH: TSH: 3.665 u[IU]/mL (ref 0.350–4.500)

## 2022-06-30 MED ORDER — PREDNISONE 20 MG PO TABS
20.0000 mg | ORAL_TABLET | Freq: Every day | ORAL | Status: DC
  Administered 2022-06-30 – 2022-07-04 (×5): 20 mg via ORAL
  Filled 2022-06-30 (×5): qty 1

## 2022-06-30 MED ORDER — IOHEXOL 350 MG/ML SOLN
100.0000 mL | Freq: Once | INTRAVENOUS | Status: AC | PRN
  Administered 2022-06-30: 100 mL via INTRAVENOUS

## 2022-06-30 MED ORDER — FLUOXETINE HCL 20 MG PO CAPS
20.0000 mg | ORAL_CAPSULE | Freq: Every day | ORAL | Status: DC
Start: 2022-06-30 — End: 2022-07-04
  Administered 2022-06-30 – 2022-07-04 (×5): 20 mg via ORAL
  Filled 2022-06-30 (×6): qty 1

## 2022-06-30 MED ORDER — ONDANSETRON HCL 4 MG/2ML IJ SOLN
4.0000 mg | Freq: Once | INTRAMUSCULAR | Status: AC
  Administered 2022-06-30: 4 mg via INTRAVENOUS
  Filled 2022-06-30: qty 2

## 2022-06-30 MED ORDER — PANTOPRAZOLE SODIUM 40 MG PO TBEC
40.0000 mg | DELAYED_RELEASE_TABLET | Freq: Every day | ORAL | Status: DC
  Administered 2022-06-30 – 2022-07-04 (×4): 40 mg via ORAL
  Filled 2022-06-30 (×5): qty 1

## 2022-06-30 MED ORDER — METOPROLOL TARTRATE 5 MG/5ML IV SOLN
2.5000 mg | Freq: Four times a day (QID) | INTRAVENOUS | Status: DC | PRN
  Filled 2022-06-30: qty 5

## 2022-06-30 MED ORDER — ATENOLOL 25 MG PO TABS
25.0000 mg | ORAL_TABLET | Freq: Every day | ORAL | Status: DC
  Administered 2022-06-30 – 2022-07-04 (×4): 25 mg via ORAL
  Filled 2022-06-30 (×6): qty 1

## 2022-06-30 MED ORDER — ROSUVASTATIN CALCIUM 20 MG PO TABS
20.0000 mg | ORAL_TABLET | Freq: Every evening | ORAL | Status: DC
  Administered 2022-06-30 – 2022-07-03 (×4): 20 mg via ORAL
  Filled 2022-06-30 (×4): qty 1

## 2022-06-30 MED ORDER — OMEGA-3-ACID ETHYL ESTERS 1 G PO CAPS
2000.0000 mg | ORAL_CAPSULE | Freq: Every day | ORAL | Status: DC
Start: 2022-06-30 — End: 2022-07-04
  Administered 2022-06-30 – 2022-07-04 (×5): 2000 mg via ORAL
  Filled 2022-06-30 (×6): qty 2

## 2022-06-30 MED ORDER — AMIODARONE HCL 200 MG PO TABS
200.0000 mg | ORAL_TABLET | Freq: Two times a day (BID) | ORAL | Status: DC
  Administered 2022-06-30 – 2022-07-04 (×7): 200 mg via ORAL
  Filled 2022-06-30 (×8): qty 1

## 2022-06-30 MED ORDER — ONDANSETRON HCL 4 MG/2ML IJ SOLN: 4.0000 mg | Freq: Four times a day (QID) | INTRAMUSCULAR | Status: DC | PRN

## 2022-06-30 MED ORDER — ALUM & MAG HYDROXIDE-SIMETH 200-200-20 MG/5ML PO SUSP: 15.0000 mL | ORAL | Status: DC | PRN

## 2022-06-30 MED ORDER — SODIUM CHLORIDE 0.9% FLUSH
3.0000 mL | INTRAVENOUS | Status: DC | PRN
  Administered 2022-07-03: 3 mL via INTRAVENOUS

## 2022-06-30 MED ORDER — ACETAMINOPHEN 325 MG PO TABS
650.0000 mg | ORAL_TABLET | ORAL | Status: DC | PRN
  Administered 2022-07-02: 650 mg via ORAL
  Filled 2022-06-30: qty 2

## 2022-06-30 MED ORDER — HEPARIN SODIUM (PORCINE) 5000 UNIT/ML IJ SOLN
5000.0000 [IU] | Freq: Two times a day (BID) | INTRAMUSCULAR | Status: DC
Start: 2022-06-30 — End: 2022-07-01
  Administered 2022-06-30: 5000 [IU] via SUBCUTANEOUS
  Filled 2022-06-30 (×2): qty 1

## 2022-06-30 MED ORDER — FLUOXETINE HCL 20 MG PO CAPS
40.0000 mg | ORAL_CAPSULE | Freq: Every day | ORAL | Status: DC
  Administered 2022-06-30: 40 mg via ORAL
  Filled 2022-06-30 (×2): qty 2

## 2022-06-30 MED ORDER — FUROSEMIDE 10 MG/ML IJ SOLN
40.0000 mg | Freq: Two times a day (BID) | INTRAMUSCULAR | Status: DC
  Administered 2022-06-30 – 2022-07-01 (×2): 40 mg via INTRAVENOUS
  Filled 2022-06-30 (×2): qty 4

## 2022-06-30 MED ORDER — TRAMADOL HCL 50 MG PO TABS
50.0000 mg | ORAL_TABLET | Freq: Every day | ORAL | Status: DC
  Administered 2022-06-30 – 2022-07-04 (×5): 50 mg via ORAL
  Filled 2022-06-30 (×6): qty 1

## 2022-06-30 MED ORDER — COLCHICINE 0.6 MG PO TABS
0.6000 mg | ORAL_TABLET | ORAL | Status: DC
  Administered 2022-06-30 – 2022-07-02 (×2): 0.6 mg via ORAL
  Filled 2022-06-30 (×3): qty 1

## 2022-06-30 MED ORDER — LEVOTHYROXINE SODIUM 88 MCG PO TABS
88.0000 ug | ORAL_TABLET | Freq: Every day | ORAL | Status: DC
  Administered 2022-07-01 – 2022-07-04 (×4): 88 ug via ORAL
  Filled 2022-06-30 (×4): qty 1

## 2022-06-30 MED ORDER — SODIUM CHLORIDE 0.9 % IV BOLUS
500.0000 mL | Freq: Once | INTRAVENOUS | Status: AC
  Administered 2022-06-30: 500 mL via INTRAVENOUS

## 2022-06-30 MED ORDER — SODIUM CHLORIDE 0.9% FLUSH
3.0000 mL | Freq: Two times a day (BID) | INTRAVENOUS | Status: DC
  Administered 2022-06-30 – 2022-07-04 (×7): 3 mL via INTRAVENOUS

## 2022-06-30 MED ORDER — CEPHALEXIN 250 MG PO CAPS
250.0000 mg | ORAL_CAPSULE | Freq: Every day | ORAL | Status: DC
  Administered 2022-06-30 – 2022-07-03 (×4): 250 mg via ORAL
  Filled 2022-06-30 (×5): qty 1

## 2022-06-30 MED ORDER — SODIUM CHLORIDE 0.9 % IV SOLN: 250.0000 mL | INTRAVENOUS | Status: DC | PRN

## 2022-06-30 MED ORDER — VITAMIN D 25 MCG (1000 UNIT) PO TABS
5000.0000 [IU] | ORAL_TABLET | Freq: Every day | ORAL | Status: DC
Start: 2022-06-30 — End: 2022-07-04
  Administered 2022-06-30 – 2022-07-04 (×5): 5000 [IU] via ORAL
  Filled 2022-06-30 (×7): qty 5

## 2022-06-30 NOTE — ED Notes (Signed)
Pt returned from CT °

## 2022-06-30 NOTE — Consult Note (Addendum)
Cardiology Consultation:  Patient ID: Faith Parker MRN: [id number]; DOB: [birth year]  Admit date: 06/30/2022 Date of Consult: 06/30/2022  Primary Care Provider: Mast, Man X, NP Primary Cardiologist: Faith Carnes, MD  Primary Electrophysiologist:  None   Patient Profile:  Faith Parker is a 88 y.o. female with a hx of persistent A-fib, HFpEF, hypothyroidism, sick sinus syndrome status post pacemaker who is being seen today for the evaluation of pericardial effusion at the request of Faith Rob, MD.  History of Present Illness:  Faith Parker was just admitted to the hospital on 06/22/2022 to 06/25/2022.  She was admitted with acute on chronic diastolic heart failure.  She was effectively diuresed and discharged home.  She now presents with abdominal pain, nausea vomiting as well as intermittent chest pain.  She underwent a CT abdomen pelvis in the emergency room.  That scan was concerning for a very large pericardial effusion.  She did undergo an echocardiogram on 06/23/2022.  That did demonstrate a small pericardial effusion that was circumferential.  Bedside echocardiogram does show moderate to large pericardial effusion.  There is no variation in mitral valve inflow.  She was noted to have a plethoric IVC on her last study.  Formal echo is pending.  CT scan also concerning for inflammatory stranding in the porta hepatis concerning for possible cholangitis.  Also concerns for possible pyelonephritis.  Her white count is 18.1.  Hemoglobin 10.2.  Platelets 383.  Serum creatinine 1.39.  AST 48, ALT 50.  She reports she feels poor and weak.  Chest x-ray also has concerns for pleural effusions.   Heart Pathway Score:       Past Medical History: Past Medical History:  Diagnosis Date   Anxiety    Aortic regurgitation    Atrial fibrillation (HCC)    Chest pain    Per new patient form   Gastritis    GERD (gastroesophageal reflux disease)    H/O artificial eye lens    Hypertension    Hypothyroid     Left ventricular hypertrophy    Major depression    Osteoarthritis    Osteoporosis    Pacemaker    On Eliquis, Per new patient form   Peptic ulcer disease    Stress incontinence     Past Surgical History: Past Surgical History:  Procedure Laterality Date   ABDOMINAL SURGERY     Per new patient form   BLADDER SURGERY     DG  BONE DENSITY ([place] HX)     Per new patient form   EYE SURGERY     KNEE ARTHROPLASTY     LEFT HEART CATH AND CORONARY ANGIOGRAPHY N/A 10/22/2019   Procedure: LEFT HEART CATH AND CORONARY ANGIOGRAPHY;  Surgeon: Faith Mocha, MD;  Location: [facility];  Service: Cardiovascular;  Laterality: N/A;   LEG SURGERY Right    multiple surgeries, unknown type   PACEMAKER IMPLANT N/A 06/04/2018   Procedure: PACEMAKER IMPLANT;  Surgeon: Faith Sprang, MD;  Location: [facility];  Service: Cardiovascular;  Laterality: N/A;     Home Medications:  Prior to Admission medications   Medication Sig Start Date End Date Taking? Authorizing Provider  alum & mag hydroxide-simeth (MAALOX/MYLANTA) 200-200-20 MG/5ML suspension Take by mouth every 4 (four) hours as needed for indigestion or heartburn.    [provider]  amiodarone (PACERONE) 200 MG tablet Take 1 tablet (200 mg total) by mouth 2 (two) times daily. 06/25/22   Faith Polite, MD  apixaban (  ELIQUIS) 5 MG TABS tablet Take 5 mg by mouth 2 (two) times daily.    [provider]  atenolol (TENORMIN) 25 MG tablet Take 25 mg by mouth daily.     [provider]  cephALEXin (KEFLEX) 250 MG capsule Take 250 mg by mouth daily.    [provider]  Cholecalciferol (VITAMIN D3) 125 MCG (5000 UT) TABS Take 5,000 Units by mouth daily.    [provider]  FLUoxetine (PROZAC) 20 MG capsule Take 20 mg by mouth daily.    [provider]  FLUoxetine (PROZAC) 40 MG capsule Take 40 mg by mouth in the morning. 12/14/20   [provider]  furosemide (LASIX) 20 MG  tablet Take 1 tablet (20 mg total) by mouth daily. Take 20 mg by mouth on Monday, Tuesday, Wednesday, Thursday, Friday, and Saturday 06/25/22   Faith Polite, MD  levothyroxine (SYNTHROID) 88 MCG tablet Take 88 mcg by mouth daily.    [provider]  losartan (COZAAR) 25 MG tablet Take 37.5 mg by mouth daily.    [provider]  nystatin powder Apply 1 Application topically 2 (two) times daily. For 3 weeks    [provider]  Omega-3 Fatty Acids (OMEGA-3 FISH OIL) 1200 MG CAPS Take 2,400 mg by mouth daily.    [provider]  omeprazole (PRILOSEC OTC) 20 MG tablet Take 20 mg by mouth daily.    [provider]  Polyethyl Glycol-Propyl Glycol (SYSTANE) 0.4-0.3 % GEL ophthalmic gel Place 1 application into both eyes at bedtime.    [provider]  rosuvastatin (CRESTOR) 20 MG tablet TAKE ONE TABLET EVERY EVENING 12/10/21   Faith Records, MD  traMADol (ULTRAM) 50 MG tablet Take 1 tablet (50 mg total) by mouth daily. 04/11/22   Parker, Faith Lange, NP    Inpatient Medications: Scheduled Meds:  Continuous Infusions:  PRN Meds:   Allergies:    Allergies  Allergen Reactions   Donepezil Nausea And Vomiting and Other (See Comments)    Severe vomiting   Ciprofloxacin Other (See Comments)    Not listed on MAR   Latex Other (See Comments)    Not listed on MAR   Levofloxacin Other (See Comments)    Not listed on MAR   Macrobid [Nitrofurantoin Monohyd Macro] Other (See Comments)    Not listed on MAR   Nitrofurantoin Other (See Comments)    Not listed on MAR   Sulfa Antibiotics Other (See Comments)    Not listed on MAR   Sulfamethoxazole     Not listed on MAR    Social History:   Social History   Socioeconomic History   Marital status: Widowed    Spouse name: Not on file   Number of children: Not on file   Years of education: Not on file   Highest education level: Not on file  Occupational History   Occupation: Retired  Tobacco  Use   Smoking status: Never   Smokeless tobacco: Never  Vaping Use   Vaping Use: Never used  Substance and Sexual Activity   Alcohol use: No   Drug use: No   Sexual activity: Not on file  Other Topics Concern   Not on file  Social History Narrative   Diet: Regular       Caffeine: Coffee and coke      Married, if yes what year: Widow      Do you live in a house, apartment, assisted living, condo, trailer, ect:  Currently living at [facility]. Want to move to assisted living      Is it one or more stories: one      How many persons live in your home? 1       Pets: no      Highest level or education completed: 2 years college       Current/Past profession: Network engineer      Exercise:  Walk and drumming                 Type and how often: 1x each weekly         Living Will: Yes    DNR: No if not, do you wish to discuss one? Yes   POA/HPOA: Yes      Functional Status:    Do you have difficulty bathing or dressing yourself? Some yes   Do you have difficulty preparing food or eating? Some yes   Do you have difficulty managing your medications? Yes   Do you have difficulty managing your finances? Yes   Do you have difficulty affording your medications? No   Social Determinants of Radio broadcast assistant Strain: Not on file  Food Insecurity: Not on file  Transportation Needs: Not on file  Physical Activity: Not on file  Stress: Not on file  Social Connections: Not on file  Intimate Partner Violence: Not on file     Family History:    Family History  Problem Relation Age of Onset   Congestive Heart Failure Mother    Arthritis Mother    Heart attack Father    Leukemia Sister    Coronary artery disease Neg Hx      ROS:  All other ROS reviewed and negative. Pertinent positives noted in the HPI.     Physical Exam/Data:   Vitals:   06/30/22 1042 06/30/22 1100 06/30/22 1230 06/30/22 1409  BP: (!) 108/92 (!) 127/96 (!) 152/114 114/81  Pulse: (!) 101  (!) 104 90 (!) 113  Resp: 18 18 (!) 21 20  Temp: 97.7 F (36.5 C)     TempSrc: Temporal     SpO2: 96% 94% 93% 93%  Weight:      Height:        Intake/Output Summary (Last 24 hours) at 06/30/2022 1430 Last data filed at 06/30/2022 1224 Gross per 24 hour  Intake 500.57 ml  Output --  Net 500.57 ml       06/30/2022   10:40 AM 06/28/2022   11:58 AM 06/24/2022    3:39 AM  Last 3 Weights  Weight (lbs) 200 lb 208 lb 12.8 oz 207 lb 0.2 oz  Weight (kg) 90.719 kg 94.711 kg 93.9 kg    Body mass index is 28.7 kg/m.  General: Well nourished, well developed, in no acute distress Head: Atraumatic, normal size  Eyes: PEERLA, EOMI  Neck: Supple, JVD 12-15 cm H20 Endocrine: No thryomegaly Cardiac: Normal S1, S2; irregular rhythm  Lungs: Diminished breath sounds bilaterally Abd: Soft, nontender, no hepatomegaly  Ext: Trace edema Musculoskeletal: No deformities, BUE and BLE strength normal and equal Skin: Warm and dry, no rashes   Neuro: Alert and oriented to person, place, time, and situation, CNII-XII grossly intact, no focal deficits  Psych: Normal mood and affect   EKG:  The EKG was personally reviewed and demonstrates:  Afib 109 bpm Telemetry:  Telemetry was personally reviewed and demonstrates:  Afib 100s  Relevant CV Studies: TTE 06/23/2022    1. Moderate  hypertrophy of the basal septum with otherwise mild LVH. Left  ventricular ejection fraction, by estimation, is 60 to 65%. The left  ventricle has normal function. The left ventricle has no regional wall  motion abnormalities. There is moderate   asymmetric left ventricular hypertrophy. Left ventricular diastolic  parameters are indeterminate. Elevated left ventricular end-diastolic  pressure.   2. Right ventricular systolic function is normal. The right ventricular  size is normal. There is mildly elevated pulmonary artery systolic  pressure.   3. Left atrial size was severely dilated.   4. A small pericardial effusion is  present. The pericardial effusion is  posterior and lateral to the left ventricle. There is no evidence of  cardiac tamponade. Moderate pleural effusion in the left lateral region.   5. The mitral valve is normal in structure. Trivial mitral valve  regurgitation. No evidence of mitral stenosis.   6. The aortic valve is tricuspid. There is moderate calcification of the  aortic valve. There is moderate thickening of the aortic valve. Aortic  valve regurgitation is mild. Moderate aortic valve stenosis. Aortic valve  area, by VTI measures 1.95 cm.  Aortic valve mean gradient measures 22.0 mmHg. Aortic valve Vmax measures  2.93 m/s.   7. Aortic dilatation noted. There is mild dilatation of the aortic root,  measuring 37 mm. There is moderate dilatation of the ascending aorta,  measuring 46 mm.   8. The inferior vena cava is dilated in size with >50% respiratory  variability, suggesting right atrial pressure of 8 mmHg.   Laboratory Data: High Sensitivity Troponin:   Recent Labs  Lab 06/22/22 0435 06/22/22 0630 06/30/22 1049 06/30/22 1223  TROPONINIHS 9 9 9 9      Cardiac EnzymesNo results for input(s): "TROPONINI" in the last 168 hours. No results for input(s): "TROPIPOC" in the last 168 hours.  Chemistry Recent Labs  Lab 06/24/22 0239 06/25/22 0201 06/30/22 1049  NA 136 135 132*  K 3.5 4.0 3.6  CL 102 101 99  CO2 26 27 23   GLUCOSE 118* 97 165*  BUN 21 15 16   CREATININE 1.49* 1.12* 1.39*  CALCIUM 8.2* 8.0* 8.6*  GFRNONAA 33* 47* 36*  ANIONGAP 8 7 10     Recent Labs  Lab 06/30/22 1049  PROT 6.1*  ALBUMIN 3.0*  AST 48*  ALT 50*  ALKPHOS 88  BILITOT 0.8   Hematology Recent Labs  Lab 06/30/22 1049  WBC 18.1*  RBC 3.80*  HGB 10.2*  HCT 31.3*  MCV 82.4  MCH 26.8  MCHC 32.6  RDW 15.9*  PLT 383   BNPNo results for input(s): "BNP", "PROBNP" in the last 168 hours.  DDimer No results for input(s): "DDIMER" in the last 168 hours.  Radiology/Studies:  CT Abdomen  Pelvis W Contrast  Result Date: 06/30/2022 CLINICAL DATA:  Nausea and emesis. Abdominal distension. Pallor. Abdominal pain. EXAM: CT ABDOMEN AND PELVIS WITH CONTRAST TECHNIQUE: Multidetector CT imaging of the abdomen and pelvis was performed using the standard protocol following bolus administration of intravenous contrast. RADIATION DOSE REDUCTION: This exam was performed according to the departmental dose-optimization program which includes automated exposure control, adjustment of the mA and/or kV according to patient size and/or use of iterative reconstruction technique. CONTRAST:  151m OMNIPAQUE IOHEXOL 350 MG/ML SOLN COMPARISON:  Multiple exams, including CT chest 10/16/2019 FINDINGS: Lower chest: Large pericardial effusion, not present on 10/16/2019. Pacer leads noted. Mild to moderate cardiomegaly. Aortic valve calcification. Thoracic aortic atherosclerotic calcification. Small to moderate hiatal hernia. Fluid distention of the  distal esophagus. Small to moderate bilateral pleural effusions with passive atelectasis. Mild scarring or atelectasis in the right middle lobe and lingula. Hepatobiliary: Accentuated density in the gallbladder, probably from sludge. Mildly accentuated enhancement in the wall of the common hepatic duct and common bile duct. No intrahepatic biliary dilatation. Indistinct stranding in the porta hepatis. Pancreas: Hypodense pancreatic tail lesion, 2.4 by 1.6 cm on image 36 series 3, internal density 9 Hounsfield units. Fluid density lesion along the inferior margin of the pancreatic body, 3.0 by 1.6 cm on image 39 series 3, internal density 2 Hounsfield units. Questionable prominence of the dorsal and ventral pancreatic duct in the pancreatic head. Possible uncinate process cystic lesion, 1.6 by 0.8 cm on image 40 series 3. Spleen: Unremarkable Adrenals/Urinary Tract: Multiple bilateral moderate to large sized benign renal cysts. 1.8 by 1.4 cm focus of hypoenhancement in the renal  parenchyma adjacent to the posterior renal cyst of the left kidney upper pole on image 14 series 8 and image 39 series 3. Urinary bladder unremarkable. The adrenal glands appear normal. Stomach/Bowel: Sigmoid colon diverticulosis, no compelling findings of active diverticulitis. Vascular/Lymphatic: Atherosclerosis is present, including aortoiliac atherosclerotic disease. There is atheromatous plaque proximally in the celiac trunk and SMA without overt occlusion. Reproductive: Unremarkable Other: Low-level edema along the root of the mesentery. Infiltrative edema in the porta hepatis. Musculoskeletal: Prominent degenerative hip arthropathy bilaterally. Bony demineralization. Left sacral nerve stimulator device. Old healed right medial eleventh rib fracture. Dextroconvex lumbar scoliosis. Mild rectus diastasis and anterior abdominal wall laxity. Lumbar spondylosis and degenerative disc disease contribute to multilevel foraminal stenosis and central narrowing of the thecal sac IMPRESSION: 1. Very large pericardial effusion, not present on 10/16/2019. Correlate with physical exam findings such as Becks triad in assessing for signs of tamponade, echocardiography may be warranted in further workup. 2. Inflammatory stranding in the porta hepatis some accentuated enhancement along the non dilated extrahepatic biliary tree. Cannot exclude cholangitis. 3. Focal hypoenhancement in the upper medial right kidney renal parenchyma, potentially from pyelonephritis or infiltrative malignancy. Correlate with urine analysis in assessing for signs of pyelonephritis. 4. Multiple hypodense, probably cystic lesions of the pancreas. The largest measures 3.0 cm in long axis. In light of the size the lesions and the patient's age, follow up pancreatic protocol CT or MRI is recommended in 2 years time. The patient does have a pacer and a sacral nerve stimulator which may preclude MRI. This recommendation follows ACR consensus guidelines:  Management of Incidental Pancreatic Cysts: A White Paper of the ACR Incidental Findings Committee. [place] 0762;26:333-545. 5. Small to moderate bilateral pleural effusions with passive atelectasis. 6. Other imaging findings of potential clinical significance: Aortic valve calcification. Aortic Atherosclerosis (ICD10-I70.0). Small to moderate hiatal hernia. Multiple bilateral moderate to large sized renal cysts. Sigmoid colon diverticulosis. Systemic atherosclerosis. Prominent degenerative hip arthropathy bilaterally. Dextroconvex lumbar scoliosis. Mild rectus diastasis. Bony demineralization. Multilevel lumbar impingement due to spondylosis and degenerative disc disease. Electronically Signed   By: Van Clines M.D.   On: 06/30/2022 13:07   DG Abd Acute W/Chest  Result Date: 06/30/2022 CLINICAL DATA:  Emesis EXAM: DG ABDOMEN ACUTE WITH 1 VIEW CHEST COMPARISON:  Radiographs 06/22/2022 FINDINGS: Mild gaseous distention of the cecum/right colon at the upper limits of normal. No dilation of the remaining colon or small. No radiopaque calculi or other significant radiographic abnormality is seen. Heart size and mediastinal contours are within normal limits. Sacral stimulator. Enlarged cardiomediastinal silhouette. Atelectasis/consolidation in the left lower lung. Scattered areas  of linear atelectasis/scarring. Possible small left pleural effusion. Aortic atherosclerotic calcification. Advanced thoracolumbar spondylosis. IMPRESSION: 1. Negative abdominal radiographs. 2. Presumed atelectasis in the left lower lung though pneumonia is difficult to exclude. Electronically Signed   By: Placido Sou M.D.   On: 06/30/2022 11:25    Assessment and Plan:   #Moderate to Large Pericardial Effusion  #Volume Overload #Pleural Effusions  -She presents with chest discomfort and feeling poorly.  Also describes nausea and vomiting.  Is evidence of pleural effusions on chest X ray and chest CT.  She now has a  large pericardial effusion.  The effusion is circumferential and enlarging.  This is based on her comparison to her echo.  A formal echo has not been read today but I have interpreted.  Her IVC is plethoric up to 3 cm.  She does not have much variation in mitral inflow but she does have pleural effusions and evidence of volume overload.  I do wonder if this is a smoldering tamponade.  Acutely she is not an extremis.  There is no urgent indication for pericardiocentesis, especially as she is on eliquis at home.  -She may ultimately end up needing pericardiocentesis.  Would hold Eliquis for now. -We will check TSH, ESR and CRP.  She does describe chest discomfort.  This could be pericarditis related. Will need prednisone/colchicine if inflammation markers elevated.  -TSH needs to be checked as well.  -Hemoglobin is stable so do not believe this is hemorrhagic. -She is hemodynamically stable.  Her white count is elevated.  We clearly need evaluation of her possible pyelonephritis and cholangitis mention on chest CT.  She will be admitted to the hospitalist service for work-up of this.  Overall does not have really any signs to suggest cholangitis of the abdominal pain.  I do not believe she has pyelonephritis. -We will go ahead and diurese her given her pleural effusions. 40 mg IV BID.  We will follow-up tomorrow regarding need for possible pericardiocentesis.  I did discuss this with the patient and her daughter.  They are reluctant to do any procedures given her age and advanced comorbidities such as a pericardiocentesis.  Given normal blood pressure we will give her time to let her Eliquis washout and then reevaluate her tomorrow for possible pericardiocentesis.  I have ordered a limited echocardiogram for the morning.   #Persistent Afib -Would allow for lenient rate control given large pericardial effusion with concern for smoldering tamponade. -ok to continue home amiodarone for rate control.  -Hold  Eliquis in case she needs pericardiocentesis tomorrow. -Eliquis likely will need to be held longer if this is pericarditis.  #Leukocytosis  -Could be pericarditis related.  Inflammatory markers pending.  For questions or updates, please contact [place] Please consult [email or web address] for contact info under   Signed, [place] B[place] T. Audie Box, MD, [place]  06/30/2022 2:30 PM

## 2022-06-30 NOTE — Progress Notes (Addendum)
MEDICATION RELATED CONSULT NOTE - INITIAL   Pharmacy Consult for Colchicine Indication: acute Pericardial Effusion  Allergies  Allergen Reactions   Donepezil Nausea And Vomiting and Other (See Comments)    Severe vomiting   Ciprofloxacin Other (See Comments)    Not listed on MAR   Latex Other (See Comments)    Not listed on MAR   Levofloxacin Other (See Comments)    Not listed on MAR   Macrobid [Nitrofurantoin Monohyd Macro] Other (See Comments)    Not listed on MAR   Nitrofurantoin Other (See Comments)    Not listed on MAR   Sulfa Antibiotics Other (See Comments)    Not listed on MAR   Sulfamethoxazole     Not listed on [facility]    Patient Measurements: Height: '5\' 10"'$  (177.8 cm) Weight: 93.8 kg (206 lb 12.7 oz) IBW/kg (Calculated) : 68.5  Vital Signs: Temp: 97.7 F (36.5 C) (07/27 1654) Temp Source: Oral (07/27 1654) BP: 141/100 (07/27 1654) Pulse Rate: 95 (07/27 1654) Intake/Output from previous day: No intake/output data recorded. Intake/Output from this shift: Total I/O In: 500.6 [IV Piggyback:500.6] Out: -   Labs: Recent Labs    06/30/22 1049  WBC 18.1*  HGB 10.2*  HCT 31.3*  PLT 383  CREATININE 1.39*  ALBUMIN 3.0*  PROT 6.1*  AST 48*  ALT 50*  ALKPHOS 88  BILITOT 0.8   Estimated Creatinine Clearance: 33.4 mL/min (A) (by C-G formula based on SCr of 1.39 mg/dL (H)).  Medical History: Past Medical History:  Diagnosis Date   Anxiety    Aortic regurgitation    Atrial fibrillation (HCC)    Chest pain    Per new patient form   Gastritis    GERD (gastroesophageal reflux disease)    H/O artificial eye lens    Hypertension    Hypothyroid    Left ventricular hypertrophy    Major depression    Osteoarthritis    Osteoporosis    Pacemaker    On Eliquis, Per new patient form   Peptic ulcer disease    Stress incontinence     Medications:  Scheduled:   amiodarone  200 mg Oral BID   atenolol  25 mg Oral Daily   cephALEXin  250 mg Oral Daily    FLUoxetine  20 mg Oral Daily   [START ON 07/01/2022] FLUoxetine  40 mg Oral q AM   furosemide  40 mg Intravenous BID   heparin  5,000 Units Subcutaneous Q12H   levothyroxine  88 mcg Oral Daily   Omega-3 Fish Oil  2,400 mg Oral Daily   omeprazole  20 mg Oral Daily   predniSONE  20 mg Oral Q breakfast   rosuvastatin  20 mg Oral QPM   sodium chloride flush  3 mL Intravenous Q12H   traMADol  50 mg Oral Daily   Vitamin D3  5,000 Units Oral Daily    Assessment: 88 yo F with PMH of Afib, HTN, chronic HFpEF, hypothyroidism, recurrent UTI on suppressive antibiotics presented to ED with intermittent chest pain and SOB. She was found to have a pericardial effusion. Pharmacy consulted to dose colchicine for treatment of acute pericardial effusion.   Plan:  Initiate colchicine 0.'6mg'$  po q48h. (Dose reduced for age, renal dysfunction, and drug interaction with amiodarone)  Continue to monitor Scr daily and if SCr worsens, decrease colchicine frequency from daily to q72h. If CrCl <10 ml/min, discontinue colchicine.  Monitor for adverse effects associated with colchicine (GI intolerance, elevated LFTs, alopecia, myotoxicity  with rosuvastatin.   Thank you for allowing pharmacy to be involved in the care of this patient.  Luisa Hart, PharmD, BCPS Clinical Pharmacist 06/30/2022 5:25 PM   Please refer to AMION for pharmacy phone number

## 2022-06-30 NOTE — H&P (Signed)
History and Physical    CHARMAINE PLACIDO DDU:[id number] DOB: June 03, 1930 DOA: 06/30/2022  PCP: Mast, Man X, NP (Confirm with patient/family/NH records and if not entered, this has to be entered at [facility] point of entry) Patient coming from: Home  I have personally briefly reviewed patient's old medical records in [place]  Chief Complaint: Chest pain, SOB  HPI: Faith Parker is a 88 y.o. female with medical history significant of chronic A-fib, HTN, chronic HFpEF, hypothyroidism, recurrent UTI on suppression antibiotics, presented with intermittent chest pain and shortness of breath.  Daughter at bedside gave history.  Patient was recently hospitalized for similar presentation last week when it was found patient was in heart failure and fluid overload, symptoms resolved after aggressive diuresis and patient was discharged on Lasix 20 mg daily.  Daughter reported that the patient continued to have intermittent chest pain and shortness of breath since discharge.  And 5 days ago, patient developed intermittent nauseous and vomiting of stomach content nonbloody nonbilious.  Patient is on Eliquis but denies any obvious bleeding problems.  No abdominal pain no diarrhea.  No fever or chills.  Denies any recent URI symptoms. ED Course:   Patient was found in and out of A-fib heart rate 60-1 20s, O2 saturation 95% on room air, blood pressure 140/80.  Troponin negative x2, EKG showed chronic A-fib no ST changes.  CT abdomen pelvis showed large pericardial effusion compared to last time.  Creatinine 1.3 compared to baseline 1.1 last week.  Cardiology consulted, patient was started on Lasix 40 mg twice daily.  Globin 7.2 compared to baseline 9.5 last week.  Review of Systems: As per HPI otherwise 14 point review of systems negative.    Past Medical History:  Diagnosis Date   Anxiety    Aortic regurgitation    Atrial fibrillation (HCC)    Chest pain    Per new patient form   Gastritis    GERD  (gastroesophageal reflux disease)    H/O artificial eye lens    Hypertension    Hypothyroid    Left ventricular hypertrophy    Major depression    Osteoarthritis    Osteoporosis    Pacemaker    On Eliquis, Per new patient form   Peptic ulcer disease    Stress incontinence     Past Surgical History:  Procedure Laterality Date   ABDOMINAL SURGERY     Per new patient form   BLADDER SURGERY     DG  BONE DENSITY ([place] HX)     Per new patient form   EYE SURGERY     KNEE ARTHROPLASTY     LEFT HEART CATH AND CORONARY ANGIOGRAPHY N/A 10/22/2019   Procedure: LEFT HEART CATH AND CORONARY ANGIOGRAPHY;  Surgeon: Sherren Mocha, MD;  Location: [place] [facility];  Service: Cardiovascular;  Laterality: N/A;   LEG SURGERY Right    multiple surgeries, unknown type   PACEMAKER IMPLANT N/A 06/04/2018   Procedure: PACEMAKER IMPLANT;  Surgeon: Deboraha Sprang, MD;  Location: [place] [facility];  Service: Cardiovascular;  Laterality: N/A;     reports that she has never smoked. She has never used smokeless tobacco. She reports that she does not drink alcohol and does not use drugs.  Allergies  Allergen Reactions   Donepezil Nausea And Vomiting and Other (See Comments)    Severe vomiting   Ciprofloxacin Other (See Comments)    Not listed on MAR   Latex Other (See Comments)  Not listed on MAR   Levofloxacin Other (See Comments)    Not listed on MAR   Macrobid [Nitrofurantoin Monohyd Macro] Other (See Comments)    Not listed on MAR   Nitrofurantoin Other (See Comments)    Not listed on MAR   Sulfa Antibiotics Other (See Comments)    Not listed on MAR   Sulfamethoxazole     Not listed on MAR    Family History  Problem Relation Age of Onset   Congestive Heart Failure Mother    Arthritis Mother    Heart attack Father    Leukemia Sister    Coronary artery disease Neg Hx      Prior to Admission medications   Medication Sig Start Date End Date Taking? Authorizing Provider  alum &  mag hydroxide-simeth (MAALOX/MYLANTA) 200-200-20 MG/5ML suspension Take by mouth every 4 (four) hours as needed for indigestion or heartburn.    [provider]  amiodarone (PACERONE) 200 MG tablet Take 1 tablet (200 mg total) by mouth 2 (two) times daily. 06/25/22   Domenic Polite, MD  apixaban (ELIQUIS) 5 MG TABS tablet Take 5 mg by mouth 2 (two) times daily.    [provider]  atenolol (TENORMIN) 25 MG tablet Take 25 mg by mouth daily.     [provider]  cephALEXin (KEFLEX) 250 MG capsule Take 250 mg by mouth daily.    [provider]  Cholecalciferol (VITAMIN D3) 125 MCG (5000 UT) TABS Take 5,000 Units by mouth daily.    [provider]  FLUoxetine (PROZAC) 20 MG capsule Take 20 mg by mouth daily.    [provider]  FLUoxetine (PROZAC) 40 MG capsule Take 40 mg by mouth in the morning. 12/14/20   [provider]  furosemide (LASIX) 20 MG tablet Take 1 tablet (20 mg total) by mouth daily. Take 20 mg by mouth on Monday, Tuesday, Wednesday, Thursday, Friday, and Saturday 06/25/22   Domenic Polite, MD  levothyroxine (SYNTHROID) 88 MCG tablet Take 88 mcg by mouth daily.    [provider]  losartan (COZAAR) 25 MG tablet Take 37.5 mg by mouth daily.    [provider]  nystatin powder Apply 1 Application topically 2 (two) times daily. For 3 weeks    [provider]  Omega-3 Fatty Acids (OMEGA-3 FISH OIL) 1200 MG CAPS Take 2,400 mg by mouth daily.    [provider]  omeprazole (PRILOSEC OTC) 20 MG tablet Take 20 mg by mouth daily.    [provider]  Polyethyl Glycol-Propyl Glycol (SYSTANE) 0.4-0.3 % GEL ophthalmic gel Place 1 application into both eyes at bedtime.    [provider]  rosuvastatin (CRESTOR) 20 MG tablet TAKE ONE TABLET EVERY EVENING 12/10/21   Fay Records, MD  traMADol (ULTRAM) 50 MG tablet Take 1 tablet (50 mg total) by mouth daily. 04/11/22   Medina-Vargas, Jaymes Graff C,  NP    Physical Exam: Vitals:   06/30/22 1516 06/30/22 1517 06/30/22 1530 06/30/22 1552  BP:   (!) 141/113 (!) 151/93  Pulse: (!) 110 97 93 89  Resp: (!) 21 19 (!) 21 20  Temp:    97.8 F (36.6 C)  TempSrc:    Temporal  SpO2: 94% 93% 95% 95%  Weight:      Height:        Constitutional: NAD, calm, comfortable Vitals:   06/30/22 1516 06/30/22 1517 06/30/22 1530 06/30/22 1552  BP:   (!) 141/113 (!) 151/93  Pulse: Marland Kitchen)  110 97 93 89  Resp: (!) 21 19 (!) 21 20  Temp:    97.8 F (36.6 C)  TempSrc:    Temporal  SpO2: 94% 93% 95% 95%  Weight:      Height:       Eyes: PERRL, lids and conjunctivae normal ENMT: Mucous membranes are moist. Posterior pharynx clear of any exudate or lesions.Normal dentition.  Neck: normal, supple, no masses, no thyromegaly Respiratory: clear to auscultation bilaterally, no wheezing, no crackles. Normal respiratory effort. No accessory muscle use.  Cardiovascular: Regular rate and rhythm, no murmurs / rubs / gallops. No extremity edema. 2+ pedal pulses. No carotid bruits.  Abdomen: no tenderness, no masses palpated. No hepatosplenomegaly. Bowel sounds positive.  Musculoskeletal: no clubbing / cyanosis. No joint deformity upper and lower extremities. Good ROM, no contractures. Normal muscle tone.  Skin: no rashes, lesions, ulcers. No induration Neurologic: CN 2-12 grossly intact. Sensation intact, DTR normal. Strength 5/5 in all 4.  Psychiatric: Normal judgment and insight. Alert and oriented x 3. Normal mood.     Labs on Admission: I have personally reviewed following labs and imaging studies  CBC: Recent Labs  Lab 06/30/22 1049  WBC 18.1*  NEUTROABS 16.0*  HGB 10.2*  HCT 31.3*  MCV 82.4  PLT 536   Basic Metabolic Panel: Recent Labs  Lab 06/24/22 0239 06/25/22 0201 06/30/22 1049  NA 136 135 132*  K 3.5 4.0 3.6  CL 102 101 99  CO2 '26 27 23  '$ GLUCOSE 118* 97 165*  BUN '21 15 16  '$ CREATININE 1.49* 1.12* 1.39*  CALCIUM 8.2* 8.0* 8.6*  MG  2.2  --   --    GFR: Estimated Creatinine Clearance: 32.9 mL/min (A) (by C-G formula based on SCr of 1.39 mg/dL (H)). Liver Function Tests: Recent Labs  Lab 06/30/22 1049  AST 48*  ALT 50*  ALKPHOS 88  BILITOT 0.8  PROT 6.1*  ALBUMIN 3.0*   Recent Labs  Lab 06/30/22 1049  LIPASE 23   No results for input(s): "AMMONIA" in the last 168 hours. Coagulation Profile: No results for input(s): "INR", "PROTIME" in the last 168 hours. Cardiac Enzymes: No results for input(s): "CKTOTAL", "CKMB", "CKMBINDEX", "TROPONINI" in the last 168 hours. BNP (last 3 results) Recent Labs    12/10/21 1641  PROBNP 697   HbA1C: No results for input(s): "HGBA1C" in the last 72 hours. CBG: No results for input(s): "GLUCAP" in the last 168 hours. Lipid Profile: No results for input(s): "CHOL", "HDL", "LDLCALC", "TRIG", "CHOLHDL", "LDLDIRECT" in the last 72 hours. Thyroid Function Tests: Recent Labs    06/30/22 1448  TSH 3.665   Anemia Panel: No results for input(s): "VITAMINB12", "FOLATE", "FERRITIN", "TIBC", "IRON", "RETICCTPCT" in the last 72 hours. Urine analysis:    Component Value Date/Time   COLORURINE YELLOW 06/30/2022 1543   APPEARANCEUR CLEAR 06/30/2022 1543   LABSPEC 1.020 06/30/2022 1543   PHURINE 6.0 06/30/2022 1543   GLUCOSEU NEGATIVE 06/30/2022 1543   HGBUR NEGATIVE 06/30/2022 1543   BILIRUBINUR NEGATIVE 06/30/2022 1543   KETONESUR NEGATIVE 06/30/2022 1543   PROTEINUR 30 (A) 06/30/2022 1543   UROBILINOGEN 0.2 06/30/2016 1149   NITRITE NEGATIVE 06/30/2022 1543   LEUKOCYTESUR NEGATIVE 06/30/2022 1543    Radiological Exams on Admission: ECHOCARDIOGRAM LIMITED  Result Date: 06/30/2022    ECHOCARDIOGRAM LIMITED REPORT   Patient Name:   SAKIRA DAHMER Date of Exam: 06/30/2022 Medical Rec #:  [id number]      Height:  70.0 in Accession #:    [id number]     Weight:       200.0 lb Date of Birth:  07/30/1930       BSA:          2.087 m Patient Age:    74 years       BP:            114/81 mmHg Patient Gender: F              HR:           101 bpm. Exam Location:  Inpatient Procedure: Limited Echo                       STAT ECHO Reported to: Dr Loralie Champagne on 06/30/2022 12:00:00 AM          Dr. Lake Bells O'Neal bedside for echo. Indications:    Pericardial effusion  History:        Patient has prior history of Echocardiogram examinations, most                 recent 06/23/2022.  Sonographer:    Clayton Lefort RDCS (AE) Referring Phys: [phone] HAO MENG IMPRESSIONS  1. Large pericardial effusion. The pericardial effusion is circumferential. The IVC is dilated though the RV does not appear to show diastolic collapse. No respirometry. I am concerned for early tamponade.  2. Left ventricular ejection fraction, by estimation, is 60 to 65%. The left ventricle has normal function. The left ventricle has no regional wall motion abnormalities. Left ventricular diastolic parameters are indeterminate.  3. Right ventricular systolic function is normal. The right ventricular size is normal.  4. The aortic valve is tricuspid. There is severe calcifcation of the aortic valve. not fully evaluated.  5. The inferior vena cava is dilated in size with <50% respiratory variability, suggesting right atrial pressure of 15 mmHg.  6. The patient was in atrial fibrillation. FINDINGS  Left Ventricle: Left ventricular ejection fraction, by estimation, is 60 to 65%. The left ventricle has normal function. The left ventricle has no regional wall motion abnormalities. Left ventricular diastolic parameters are indeterminate. Right Ventricle: The right ventricular size is normal. No increase in right ventricular wall thickness. Right ventricular systolic function is normal. Pericardium: A large pericardial effusion is present. The pericardial effusion is circumferential. Aortic Valve: The aortic valve is tricuspid. There is severe calcifcation of the aortic valve. Not fully evaluated. Venous: The inferior vena cava is dilated in  size with less than 50% respiratory variability, suggesting right atrial pressure of 15 mmHg. IVC IVC diam: 3.00 cm Dalton McleanMD Electronically signed by Franki Monte Signature Date/Time: 06/30/2022/3:08:41 PM    Final    CT Abdomen Pelvis W Contrast  Result Date: 06/30/2022 CLINICAL DATA:  Nausea and emesis. Abdominal distension. Pallor. Abdominal pain. EXAM: CT ABDOMEN AND PELVIS WITH CONTRAST TECHNIQUE: Multidetector CT imaging of the abdomen and pelvis was performed using the standard protocol following bolus administration of intravenous contrast. RADIATION DOSE REDUCTION: This exam was performed according to the departmental dose-optimization program which includes automated exposure control, adjustment of the mA and/or kV according to patient size and/or use of iterative reconstruction technique. CONTRAST:  141m OMNIPAQUE IOHEXOL 350 MG/ML SOLN COMPARISON:  Multiple exams, including CT chest 10/16/2019 FINDINGS: Lower chest: Large pericardial effusion, not present on 10/16/2019. Pacer leads noted. Mild to moderate cardiomegaly. Aortic valve calcification. Thoracic aortic atherosclerotic calcification. Small to moderate hiatal hernia. Fluid distention of the  distal esophagus. Small to moderate bilateral pleural effusions with passive atelectasis. Mild scarring or atelectasis in the right middle lobe and lingula. Hepatobiliary: Accentuated density in the gallbladder, probably from sludge. Mildly accentuated enhancement in the wall of the common hepatic duct and common bile duct. No intrahepatic biliary dilatation. Indistinct stranding in the porta hepatis. Pancreas: Hypodense pancreatic tail lesion, 2.4 by 1.6 cm on image 36 series 3, internal density 9 Hounsfield units. Fluid density lesion along the inferior margin of the pancreatic body, 3.0 by 1.6 cm on image 39 series 3, internal density 2 Hounsfield units. Questionable prominence of the dorsal and ventral pancreatic duct in the pancreatic head.  Possible uncinate process cystic lesion, 1.6 by 0.8 cm on image 40 series 3. Spleen: Unremarkable Adrenals/Urinary Tract: Multiple bilateral moderate to large sized benign renal cysts. 1.8 by 1.4 cm focus of hypoenhancement in the renal parenchyma adjacent to the posterior renal cyst of the left kidney upper pole on image 14 series 8 and image 39 series 3. Urinary bladder unremarkable. The adrenal glands appear normal. Stomach/Bowel: Sigmoid colon diverticulosis, no compelling findings of active diverticulitis. Vascular/Lymphatic: Atherosclerosis is present, including aortoiliac atherosclerotic disease. There is atheromatous plaque proximally in the celiac trunk and SMA without overt occlusion. Reproductive: Unremarkable Other: Low-level edema along the root of the mesentery. Infiltrative edema in the porta hepatis. Musculoskeletal: Prominent degenerative hip arthropathy bilaterally. Bony demineralization. Left sacral nerve stimulator device. Old healed right medial eleventh rib fracture. Dextroconvex lumbar scoliosis. Mild rectus diastasis and anterior abdominal wall laxity. Lumbar spondylosis and degenerative disc disease contribute to multilevel foraminal stenosis and central narrowing of the thecal sac IMPRESSION: 1. Very large pericardial effusion, not present on 10/16/2019. Correlate with physical exam findings such as Becks triad in assessing for signs of tamponade, echocardiography may be warranted in further workup. 2. Inflammatory stranding in the porta hepatis some accentuated enhancement along the non dilated extrahepatic biliary tree. Cannot exclude cholangitis. 3. Focal hypoenhancement in the upper medial right kidney renal parenchyma, potentially from pyelonephritis or infiltrative malignancy. Correlate with urine analysis in assessing for signs of pyelonephritis. 4. Multiple hypodense, probably cystic lesions of the pancreas. The largest measures 3.0 cm in long axis. In light of the size the lesions  and the patient's age, follow up pancreatic protocol CT or MRI is recommended in 2 years time. The patient does have a pacer and a sacral nerve stimulator which may preclude MRI. This recommendation follows ACR consensus guidelines: Management of Incidental Pancreatic Cysts: A White Paper of the ACR Incidental Findings Committee. [place] 7893;81:017-510. 5. Small to moderate bilateral pleural effusions with passive atelectasis. 6. Other imaging findings of potential clinical significance: Aortic valve calcification. Aortic Atherosclerosis (ICD10-I70.0). Small to moderate hiatal hernia. Multiple bilateral moderate to large sized renal cysts. Sigmoid colon diverticulosis. Systemic atherosclerosis. Prominent degenerative hip arthropathy bilaterally. Dextroconvex lumbar scoliosis. Mild rectus diastasis. Bony demineralization. Multilevel lumbar impingement due to spondylosis and degenerative disc disease. Electronically Signed   By: Van Clines M.D.   On: 06/30/2022 13:07   DG Abd Acute W/Chest  Result Date: 06/30/2022 CLINICAL DATA:  Emesis EXAM: DG ABDOMEN ACUTE WITH 1 VIEW CHEST COMPARISON:  Radiographs 06/22/2022 FINDINGS: Mild gaseous distention of the cecum/right colon at the upper limits of normal. No dilation of the remaining colon or small. No radiopaque calculi or other significant radiographic abnormality is seen. Heart size and mediastinal contours are within normal limits. Sacral stimulator. Enlarged cardiomediastinal silhouette. Atelectasis/consolidation in the left lower lung. Scattered areas  of linear atelectasis/scarring. Possible small left pleural effusion. Aortic atherosclerotic calcification. Advanced thoracolumbar spondylosis. IMPRESSION: 1. Negative abdominal radiographs. 2. Presumed atelectasis in the left lower lung though pneumonia is difficult to exclude. Electronically Signed   By: Placido Sou M.D.   On: 06/30/2022 11:25    EKG: Independently reviewed.   A-fib  Assessment/Plan Principal Problem:   Pericarditis  (please populate well all problems here in Problem List. (For example, if patient is on BP meds at home and you resume or decide to hold them, it is a problem that needs to be her. Same for CAD, COPD, HLD and so on)  Pericardial effusion -Etiology unknown, clinically patient is not hypotensive, no persistent tachycardia but rather in and out A-fib.  Etiology of new pericardial effusion is unknown, as on echo 7 days ago, only a small amount of pericardial effusion was shown.  As per family, no history of pericarditis and no recent URI symptoms. -As for treatment, discussion among family and cardiology reached conclusion that conservative management is preferred over surgical intervention.  Agreed with increase diuresis to 40 mg Lasix twice daily for now. -Bedside echo is being done to rule out tamponade. -Other DDx, hemorrhagic pericardial effusion less likely given stable H&H.  But agreed with holding Eliquis for now.  Acute on chronic HFpEF decompensation -With increasing bilateral pleural effusion, might also related to increasing size of pericardial effusion and diastolic dysfunction, but as of now there is no pulses paradoxus or drop of BP.  Diuresis as above.  Leukocytosis -No cough shortness of breath urinary symptoms and patient has been on chronic suppression antibiotics or diarrhea.  Monitor off antibiotics.  Consider leukocytosis may be related to pericardial effusion but doubt nature of the pericardial effusion is infectious. -UA pending  Chronic A-fib -Resume atenolol and amiodarone, add as needed Lopressor for rate control -Hold Eliquis  Hypothyroidism -Stable  Chronic normocytic anemia -H&H stable   DVT prophylaxis: Lovenox Code Status: DNR Family Communication: Daughter at bedside Disposition Plan: Patient sick with large pericardial effusion may need surgical intervention, expect more than 2 midnight hospital  stay. Consults called: Cardiology Admission status: Telemetry admission   Lequita Halt MD [facility] Pager [phone]  06/30/2022, 4:07 PM

## 2022-06-30 NOTE — ED Notes (Signed)
Pt. returned from XR. 

## 2022-06-30 NOTE — Progress Notes (Signed)
  Echocardiogram 2D Echocardiogram has been performed.  Faith Parker 06/30/2022, 2:43 PM

## 2022-06-30 NOTE — Progress Notes (Signed)
Remote pacemaker transmission.   

## 2022-06-30 NOTE — ED Provider Notes (Signed)
[facility] EMERGENCY DEPARTMENT Provider Note   CSN: [id number] Arrival date & time: 06/30/22  1029     History  Chief Complaint  Patient presents with   Emesis    Faith Parker is a 88 y.o. female.  Faith Parker is a 88 y.o. female with hx of HTN, AFIB, CHF, hypothyroid, GERD, depression, who presents via EMS from [facility] for evaluation of nausea and emesis.  Patient was recently admitted to the hospital after a similar presentation.  Patient has difficulty providing history.  Reports that she has been nauseated for the past few days, but started having vomiting last night and this morning.  Facility had expressed concern to EMS about a possible GI bleed, but patient denies seeing any bright red blood in emesis, no dark stools or blood in stools.  Patient currently denies pain reports last bowel movement was this morning and she has been able to pass gas.  Noted to be in A-fib with EMS in transport with rates ranging in the 60s-120s.  Daughter arrived at bedside and provided additional history that since being discharged from the hospital she is intermittently complained of chest pain but then started having more persistent nausea and vomiting.  Patient currently denying chest pain or shortness of breath.  During recent hospitalization patient had similar presentation and was found to be fluid overloaded with improvement after diuresis.  The history is provided by the patient, medical records and a relative.  Emesis Associated symptoms: no abdominal pain, no chills and no fever        Home Medications Prior to Admission medications   Medication Sig Start Date End Date Taking? Authorizing Provider  alum & mag hydroxide-simeth (MAALOX/MYLANTA) 200-200-20 MG/5ML suspension Take by mouth every 4 (four) hours as needed for indigestion or heartburn.    [provider]  amiodarone (PACERONE) 200 MG tablet Take 1 tablet (200 mg total) by mouth 2 (two) times  daily. 06/25/22   Domenic Polite, MD  apixaban (ELIQUIS) 5 MG TABS tablet Take 5 mg by mouth 2 (two) times daily.    [provider]  atenolol (TENORMIN) 25 MG tablet Take 25 mg by mouth daily.     [provider]  cephALEXin (KEFLEX) 250 MG capsule Take 250 mg by mouth daily.    [provider]  Cholecalciferol (VITAMIN D3) 125 MCG (5000 UT) TABS Take 5,000 Units by mouth daily.    [provider]  FLUoxetine (PROZAC) 20 MG capsule Take 20 mg by mouth daily.    [provider]  FLUoxetine (PROZAC) 40 MG capsule Take 40 mg by mouth in the morning. 12/14/20   [provider]  furosemide (LASIX) 20 MG tablet Take 1 tablet (20 mg total) by mouth daily. Take 20 mg by mouth on Monday, Tuesday, Wednesday, Thursday, Friday, and Saturday 06/25/22   Domenic Polite, MD  levothyroxine (SYNTHROID) 88 MCG tablet Take 88 mcg by mouth daily.    [provider]  losartan (COZAAR) 25 MG tablet Take 37.5 mg by mouth daily.    [provider]  nystatin powder Apply 1 Application topically 2 (two) times daily. For 3 weeks    [provider]  Omega-3 Fatty Acids (OMEGA-3 FISH OIL) 1200 MG CAPS Take 2,400 mg by mouth daily.    [provider]  omeprazole (PRILOSEC OTC) 20 MG tablet Take 20 mg by mouth daily.    [provider]  Polyethyl Glycol-Propyl Glycol (SYSTANE) 0.4-0.3 % GEL  ophthalmic gel Place 1 application into both eyes at bedtime.    [provider]  rosuvastatin (CRESTOR) 20 MG tablet TAKE ONE TABLET EVERY EVENING 12/10/21   Fay Records, MD  traMADol (ULTRAM) 50 MG tablet Take 1 tablet (50 mg total) by mouth daily. 04/11/22   Medina-Vargas, Monina C, NP      Allergies    Donepezil, Ciprofloxacin, Latex, Levofloxacin, Macrobid [nitrofurantoin monohyd macro], Nitrofurantoin, Sulfa antibiotics, and Sulfamethoxazole    Review of Systems   Review of Systems  Constitutional:  Negative for chills and  fever.  Respiratory:  Negative for shortness of breath.   Cardiovascular:  Negative for chest pain.  Gastrointestinal:  Positive for nausea and vomiting. Negative for abdominal distention and abdominal pain.  All other systems reviewed and are negative.   Physical Exam Updated Vital Signs BP (!) 127/96   Pulse (!) 104   Temp 97.7 F (36.5 C) (Temporal)   Resp 18   Ht '5\' 10"'$  (1.778 m)   Wt 90.7 kg   SpO2 94%   BMI 28.70 kg/m  Physical Exam Vitals and nursing note reviewed.  Constitutional:      General: She is not in acute distress.    Appearance: Normal appearance. She is well-developed. She is ill-appearing. She is not diaphoretic.     Comments: Elderly female, alert, chronically ill appearing, not in acute distress, no active vomiting  HENT:     Head: Normocephalic and atraumatic.  Eyes:     General:        Right eye: No discharge.        Left eye: No discharge.     Pupils: Pupils are equal, round, and reactive to light.  Cardiovascular:     Rate and Rhythm: Regular rhythm. Tachycardia present.     Pulses: Normal pulses.     Heart sounds: Normal heart sounds.  Pulmonary:     Effort: Pulmonary effort is normal. No respiratory distress.     Breath sounds: Normal breath sounds. No wheezing or rales.     Comments: Respirations equal and unlabored, patient able to speak in full sentences, lungs clear to auscultation bilaterally  Abdominal:     General: Bowel sounds are normal. There is no distension.     Palpations: Abdomen is soft. There is no mass.     Tenderness: There is abdominal tenderness. There is no guarding.     Comments: Abdomen soft, nondistended, very mild upper abdominal tenderness, no guarding or peritoneal signs  Musculoskeletal:        General: No deformity.     Cervical back: Neck supple.  Skin:    General: Skin is warm and dry.     Capillary Refill: Capillary refill takes less than 2 seconds.  Neurological:     Mental Status: She is alert and  oriented to person, place, and time.     Coordination: Coordination normal.     Comments: Speech is clear, able to follow commands CN III-XII intact Normal strength in upper and lower extremities bilaterally including dorsiflexion and plantar flexion, strong and equal grip strength Sensation normal to light and sharp touch Moves extremities without ataxia, coordination intact  Psychiatric:        Mood and Affect: Mood normal.        Behavior: Behavior normal.     ED Results / Procedures / Treatments   Labs (all labs ordered are listed, but only abnormal results are displayed) Labs Reviewed  COMPREHENSIVE METABOLIC PANEL - Abnormal;  Notable for the following components:      Result Value   Sodium 132 (*)    Glucose, Bld 165 (*)    Creatinine, Ser 1.39 (*)    Calcium 8.6 (*)    Total Protein 6.1 (*)    Albumin 3.0 (*)    AST 48 (*)    ALT 50 (*)    GFR, Estimated 36 (*)    All other components within normal limits  CBC WITH DIFFERENTIAL/PLATELET - Abnormal; Notable for the following components:   WBC 18.1 (*)    RBC 3.80 (*)    Hemoglobin 10.2 (*)    HCT 31.3 (*)    RDW 15.9 (*)    Neutro Abs 16.0 (*)    Lymphs Abs 0.5 (*)    Monocytes Absolute 1.5 (*)    Abs Immature Granulocytes 0.12 (*)    All other components within normal limits  LIPASE, BLOOD  POC OCCULT BLOOD, ED  TROPONIN I (HIGH SENSITIVITY)  TROPONIN I (HIGH SENSITIVITY)    EKG EKG Interpretation  Date/Time:  Thursday June 30 2022 10:53:41 EDT Ventricular Rate:  109 PR Interval:  158 QRS Duration: 81 QT Interval:  431 QTC Calculation: 551 R Axis:   67 Text Interpretation: Ventricular-paced complexes No further rhythm analysis attempted due to paced rhythm Low voltage, precordial leads Borderline T abnormalities, diffuse leads Prolonged QT interval Confirmed by Aletta Edouard [phone]) on 07/01/2022 12:37:17 PM  Radiology  CT Abdomen Pelvis W Contrast  Result Date: 06/30/2022 CLINICAL DATA:  Nausea  and emesis. Abdominal distension. Pallor. Abdominal pain. EXAM: CT ABDOMEN AND PELVIS WITH CONTRAST TECHNIQUE: Multidetector CT imaging of the abdomen and pelvis was performed using the standard protocol following bolus administration of intravenous contrast. RADIATION DOSE REDUCTION: This exam was performed according to the departmental dose-optimization program which includes automated exposure control, adjustment of the mA and/or kV according to patient size and/or use of iterative reconstruction technique. CONTRAST:  181m OMNIPAQUE IOHEXOL 350 MG/ML SOLN COMPARISON:  Multiple exams, including CT chest 10/16/2019 FINDINGS: Lower chest: Large pericardial effusion, not present on 10/16/2019. Pacer leads noted. Mild to moderate cardiomegaly. Aortic valve calcification. Thoracic aortic atherosclerotic calcification. Small to moderate hiatal hernia. Fluid distention of the distal esophagus. Small to moderate bilateral pleural effusions with passive atelectasis. Mild scarring or atelectasis in the right middle lobe and lingula. Hepatobiliary: Accentuated density in the gallbladder, probably from sludge. Mildly accentuated enhancement in the wall of the common hepatic duct and common bile duct. No intrahepatic biliary dilatation. Indistinct stranding in the porta hepatis. Pancreas: Hypodense pancreatic tail lesion, 2.4 by 1.6 cm on image 36 series 3, internal density 9 Hounsfield units. Fluid density lesion along the inferior margin of the pancreatic body, 3.0 by 1.6 cm on image 39 series 3, internal density 2 Hounsfield units. Questionable prominence of the dorsal and ventral pancreatic duct in the pancreatic head. Possible uncinate process cystic lesion, 1.6 by 0.8 cm on image 40 series 3. Spleen: Unremarkable Adrenals/Urinary Tract: Multiple bilateral moderate to large sized benign renal cysts. 1.8 by 1.4 cm focus of hypoenhancement in the renal parenchyma adjacent to the posterior renal cyst of the left kidney  upper pole on image 14 series 8 and image 39 series 3. Urinary bladder unremarkable. The adrenal glands appear normal. Stomach/Bowel: Sigmoid colon diverticulosis, no compelling findings of active diverticulitis. Vascular/Lymphatic: Atherosclerosis is present, including aortoiliac atherosclerotic disease. There is atheromatous plaque proximally in the celiac trunk and SMA without overt occlusion. Reproductive: Unremarkable Other: Low-level edema  along the root of the mesentery. Infiltrative edema in the porta hepatis. Musculoskeletal: Prominent degenerative hip arthropathy bilaterally. Bony demineralization. Left sacral nerve stimulator device. Old healed right medial eleventh rib fracture. Dextroconvex lumbar scoliosis. Mild rectus diastasis and anterior abdominal wall laxity. Lumbar spondylosis and degenerative disc disease contribute to multilevel foraminal stenosis and central narrowing of the thecal sac IMPRESSION: 1. Very large pericardial effusion, not present on 10/16/2019. Correlate with physical exam findings such as Becks triad in assessing for signs of tamponade, echocardiography may be warranted in further workup. 2. Inflammatory stranding in the porta hepatis some accentuated enhancement along the non dilated extrahepatic biliary tree. Cannot exclude cholangitis. 3. Focal hypoenhancement in the upper medial right kidney renal parenchyma, potentially from pyelonephritis or infiltrative malignancy. Correlate with urine analysis in assessing for signs of pyelonephritis. 4. Multiple hypodense, probably cystic lesions of the pancreas. The largest measures 3.0 cm in long axis. In light of the size the lesions and the patient's age, follow up pancreatic protocol CT or MRI is recommended in 2 years time. The patient does have a pacer and a sacral nerve stimulator which may preclude MRI. This recommendation follows ACR consensus guidelines: Management of Incidental Pancreatic Cysts: A White Paper of the ACR  Incidental Findings Committee. [place] [id number]. 5. Small to moderate bilateral pleural effusions with passive atelectasis. 6. Other imaging findings of potential clinical significance: Aortic valve calcification. Aortic Atherosclerosis (ICD10-I70.0). Small to moderate hiatal hernia. Multiple bilateral moderate to large sized renal cysts. Sigmoid colon diverticulosis. Systemic atherosclerosis. Prominent degenerative hip arthropathy bilaterally. Dextroconvex lumbar scoliosis. Mild rectus diastasis. Bony demineralization. Multilevel lumbar impingement due to spondylosis and degenerative disc disease. Electronically Signed   By: Van Clines M.D.   On: 06/30/2022 13:07   DG Abd Acute W/Chest  Result Date: 06/30/2022 CLINICAL DATA:  Emesis EXAM: DG ABDOMEN ACUTE WITH 1 VIEW CHEST COMPARISON:  Radiographs 06/22/2022 FINDINGS: Mild gaseous distention of the cecum/right colon at the upper limits of normal. No dilation of the remaining colon or small. No radiopaque calculi or other significant radiographic abnormality is seen. Heart size and mediastinal contours are within normal limits. Sacral stimulator. Enlarged cardiomediastinal silhouette. Atelectasis/consolidation in the left lower lung. Scattered areas of linear atelectasis/scarring. Possible small left pleural effusion. Aortic atherosclerotic calcification. Advanced thoracolumbar spondylosis. IMPRESSION: 1. Negative abdominal radiographs. 2. Presumed atelectasis in the left lower lung though pneumonia is difficult to exclude. Electronically Signed   By: Placido Sou M.D.   On: 06/30/2022 11:25      Procedures .Critical Care  Performed by: Jacqlyn Larsen, PA-C Authorized by: Jacqlyn Larsen, PA-C   Critical care provider statement:    Critical care time (minutes):  30   Critical care was necessary to treat or prevent imminent or life-threatening deterioration of the following conditions:  Cardiac failure (Large pericardial  effusion at risk for tamponade)   Critical care was time spent personally by me on the following activities:  Development of treatment plan with patient or surrogate, discussions with consultants, evaluation of patient's response to treatment, examination of patient, ordering and review of laboratory studies, ordering and review of radiographic studies, ordering and performing treatments and interventions, pulse oximetry, re-evaluation of patient's condition and review of old charts     Medications Ordered in ED Medications  sodium chloride 0.9 % bolus 500 mL (0 mLs Intravenous Stopped 06/30/22 1224)  ondansetron (ZOFRAN) injection 4 mg (4 mg Intravenous Given 06/30/22 1052)    ED Course/ Medical Decision Making/  A&P                           Medical Decision Making Amount and/or Complexity of Data Reviewed Labs: ordered. Radiology: ordered.  Risk Prescription drug management. Decision regarding hospitalization.   Faith Parker is a 88 y.o. female presents to the ED for concern of nausea and vomiting, this involves an extensive number of treatment options, and is a complaint that carries with it a high risk of complications and morbidity.  The differential diagnosis includes bowel obstruction, gastritis, perforation, intra-abdominal infection, ACS   Additional history obtained:  Additional history obtained from daughter at bedside External records from outside source obtained and reviewed including records from recent hospitalization as well as recent imaging, echo and labs   Lab Tests:  I Ordered, reviewed, and interpreted labs.  The pertinent results include:  Labs with leukocytosis of 18.1 present, stable hemoglobin,Slight worsening of renal function, negative troponins, BNP elevated at 831.  UA without signs of infection.  Normal lipase and LFTs   Imaging Studies ordered:  I ordered imaging studies including acute abdominal series and CT abdomen pelvis I independently  visualized and interpreted imaging which showed no obstruction or free air on acute abdominal series.  Presumed atelectasis versus pneumonia in the left lower lung base.  CT with markedly increased pleural effusion compared to echo during most recent hospitalization, patient currently not showing signs of cardiac tamponade there is also some inflammation in the biliary tree and focal hypoenhancement of the right kidney, could be due to pyelonephritis, but UA without signs of infection.  Patient with numerous large renal cyst.  Cystic lesion of the pancreas as well.  Some pleural effusions present I agree with the radiologist interpretation   Cardiac Monitoring:  The patient was maintained on a cardiac monitor.  I personally viewed and interpreted the cardiac monitored which showed an underlying rhythm of: A-fib with rates initially in the low 100s   Medicines ordered and prescription drug management:  I ordered medication including Zofran for nausea I have reviewed the patients home medicines and have made adjustments as needed     ED Course:  Patient presents with nonspecific nausea and vomiting, without reported abdominal pain, passing bowel movements today.  Nursing facility expressed concern for possible GI bleed but no gross blood noted on rectal exam and Hemoccult negative. Labs and CT ordered.  Patient with recent hospitalization and presentation for similar symptoms. Show a leukocytosis but otherwise no significant derangement, BNP is elevated Imaging with a large pericardial effusion, significantly increased from recent echo during admission, patient currently stable without signs of cardiac tamponade but certainly at risk for this. Patient's daughters are at bedside, myself and Dr. Roslynn Amble discussed results.  Family and patient's state that they do not want surgery or super invasive procedures at this time but they are okay with medical management.  Patient will require hospital  admission, and will consult with cardiology so they can discuss different potential treatment options for pericardial effusion.    Consultations Obtained:  I requested consultation with the cardiologist,  and discussed lab and imaging findings as well as pertinent plan - they recommend: Medicine admission, they will be down to see and evaluate patient shortly to help discuss treatment options with patient and family at bedside for large pericardial effusion I requested consultation with the hospitalist for admission, case discussed with Dr. Roosevelt Locks who will see and admit the patient.   Dispostion:  After consideration of the diagnostic results and the patients response to treatment feel that the patent would benefit from admission.          Final Clinical Impression(s) / ED Diagnoses Final diagnoses:  Pericardial effusion    Rx / DC Orders ED Discharge Orders     None         Janet Berlin 07/07/22 1132    Lucrezia Starch, MD 07/07/22 2219

## 2022-06-30 NOTE — ED Notes (Signed)
ED TO INPATIENT HANDOFF REPORT  ED Nurse Name and Phone #: Andee Poles [phone]  S Name/Age/Gender Faith Parker 88 y.o. female Room/Bed: RESUSC/RESUSC  Code Status   Code Status: DNR  Home/SNF/Other Home Patient oriented to: self, place, time, and situation Is this baseline? Yes   Triage Complete: Triage complete  Chief Complaint Pericarditis [I31.9]  Triage Note Pt BIB EMS from [facility] for nausea and emesis. Per facility, pt's pale and has abdominal distention. Pt denies any pain, last BM this morning. No dark stools or blood in emesis. Pt has a history of A-fib.   EMS Vitals CBG 173 140/80 A-fib 60-120 95% RA    Allergies Allergies  Allergen Reactions   Donepezil Nausea And Vomiting and Other (See Comments)    Severe vomiting   Ciprofloxacin Other (See Comments)    Not listed on MAR   Latex Other (See Comments)    Not listed on MAR   Levofloxacin Other (See Comments)    Not listed on MAR   Macrobid [Nitrofurantoin Monohyd Macro] Other (See Comments)    Not listed on MAR   Nitrofurantoin Other (See Comments)    Not listed on MAR   Sulfa Antibiotics Other (See Comments)    Not listed on MAR   Sulfamethoxazole     Not listed on MAR    Level of Care/Admitting Diagnosis ED Disposition     ED Disposition  Admit   Condition  --   [place]: [place] [[id number]]  Level of Care: Telemetry Medical [104]  May admit patient to [facility] or [facility] if equivalent level of care is available:: No  Covid Evaluation: Asymptomatic - no recent exposure (last 10 days) testing not required  Diagnosis: Pericarditis [563875]  Admitting Physician: Lequita Halt [[id number]]  Attending Physician: Lequita Halt [[id number]]  Certification:: I certify this patient will need inpatient services for at least 2 midnights  Estimated Length of Stay: 3          B Medical/Surgery History Past Medical History:  Diagnosis Date   Anxiety     Aortic regurgitation    Atrial fibrillation (HCC)    Chest pain    Per new patient form   Gastritis    GERD (gastroesophageal reflux disease)    H/O artificial eye lens    Hypertension    Hypothyroid    Left ventricular hypertrophy    Major depression    Osteoarthritis    Osteoporosis    Pacemaker    On Eliquis, Per new patient form   Peptic ulcer disease    Stress incontinence    Past Surgical History:  Procedure Laterality Date   ABDOMINAL SURGERY     Per new patient form   BLADDER SURGERY     DG  BONE DENSITY ([place] HX)     Per new patient form   EYE SURGERY     KNEE ARTHROPLASTY     LEFT HEART CATH AND CORONARY ANGIOGRAPHY N/A 10/22/2019   Procedure: LEFT HEART CATH AND CORONARY ANGIOGRAPHY;  Surgeon: Sherren Mocha, MD;  Location: [place] [facility];  Service: Cardiovascular;  Laterality: N/A;   LEG SURGERY Right    multiple surgeries, unknown type   PACEMAKER IMPLANT N/A 06/04/2018   Procedure: PACEMAKER IMPLANT;  Surgeon: Deboraha Sprang, MD;  Location: [facility] [facility];  Service: Cardiovascular;  Laterality: N/A;     A IV Location/Drains/Wounds Patient Lines/Drains/Airways Status     Active Line/Drains/Airways  Name Placement date Placement time Site Days   Peripheral IV 06/30/22 Right Antecubital 06/30/22  1050  Antecubital  less than 1   External Urinary Catheter 06/30/22  1452  --  less than 1   Incision (Closed) 06/04/18 Chest Left 06/04/18  2000  -- 1487            Intake/Output Last 24 hours  Intake/Output Summary (Last 24 hours) at 06/30/2022 1527 Last data filed at 06/30/2022 1224 Gross per 24 hour  Intake 500.57 ml  Output --  Net 500.57 ml    Labs/Imaging Results for orders placed or performed during the hospital encounter of 06/30/22 (from the past 48 hour(s))  Comprehensive metabolic panel     Status: Abnormal   Collection Time: 06/30/22 10:49 AM  Result Value Ref Range   Sodium 132 (L) 135 - 145 mmol/L   Potassium 3.6 3.5  - 5.1 mmol/L   Chloride 99 98 - 111 mmol/L   CO2 23 22 - 32 mmol/L   Glucose, Bld 165 (H) 70 - 99 mg/dL    Comment: Glucose reference range applies only to samples taken after fasting for at least 8 hours.   BUN 16 8 - 23 mg/dL   Creatinine, Ser 1.39 (H) 0.44 - 1.00 mg/dL   Calcium 8.6 (L) 8.9 - 10.3 mg/dL   Total Protein 6.1 (L) 6.5 - 8.1 g/dL   Albumin 3.0 (L) 3.5 - 5.0 g/dL   AST 48 (H) 15 - 41 U/L   ALT 50 (H) 0 - 44 U/L   Alkaline Phosphatase 88 38 - 126 U/L   Total Bilirubin 0.8 0.3 - 1.2 mg/dL   GFR, Estimated 36 (L) >60 mL/min    Comment: (NOTE) Calculated using the CKD-EPI Creatinine Equation (2021)    Anion gap 10 5 - 15    Comment: Performed at [facility], [place] [address] [place], [place] [address]  Troponin I (High Sensitivity)     Status: None   Collection Time: 06/30/22 10:49 AM  Result Value Ref Range   Troponin I (High Sensitivity) 9 <18 ng/L    Comment: (NOTE) Elevated high sensitivity troponin I (hsTnI) values and significant  changes across serial measurements may suggest ACS but many other  chronic and acute conditions are known to elevate hsTnI results.  Refer to the "Links" section for chest pain algorithms and additional  guidance. Performed at [facility], [place] [address], [place], [place] [address]   CBC with Differential     Status: Abnormal   Collection Time: 06/30/22 10:49 AM  Result Value Ref Range   WBC 18.1 (H) 4.0 - 10.5 K/uL   RBC 3.80 (L) 3.87 - 5.11 MIL/uL   Hemoglobin 10.2 (L) 12.0 - 15.0 g/dL   HCT 31.3 (L) 36.0 - 46.0 %   MCV 82.4 80.0 - 100.0 fL   MCH 26.8 26.0 - 34.0 pg   MCHC 32.6 30.0 - 36.0 g/dL   RDW 15.9 (H) 11.5 - 15.5 %   Platelets 383 150 - 400 K/uL   nRBC 0.0 0.0 - 0.2 %   Neutrophils Relative % 88 %   Neutro Abs 16.0 (H) 1.7 - 7.7 K/uL   Lymphocytes Relative 3 %   Lymphs Abs 0.5 (L) 0.7 - 4.0 K/uL   Monocytes Relative 8 %   Monocytes Absolute 1.5 (H) 0.1 - 1.0 K/uL   Eosinophils Relative 0 %    Eosinophils Absolute 0.0 0.0 - 0.5 K/uL   Basophils  Relative 0 %   Basophils Absolute 0.0 0.0 - 0.1 K/uL   Immature Granulocytes 1 %   Abs Immature Granulocytes 0.12 (H) 0.00 - 0.07 K/uL    Comment: Performed at [facility], [place] [address] [place], [place] [address]  Lipase, blood     Status: None   Collection Time: 06/30/22 10:49 AM  Result Value Ref Range   Lipase 23 11 - 51 U/L    Comment: Performed at [place] [address], [place], [place] [address]  POC occult blood, ED     Status: None   Collection Time: 06/30/22 11:06 AM  Result Value Ref Range   Fecal Occult Bld NEGATIVE NEGATIVE  Troponin I (High Sensitivity)     Status: None   Collection Time: 06/30/22 12:23 PM  Result Value Ref Range   Troponin I (High Sensitivity) 9 <18 ng/L    Comment: (NOTE) Elevated high sensitivity troponin I (hsTnI) values and significant  changes across serial measurements may suggest ACS but many other  chronic and acute conditions are known to elevate hsTnI results.  Refer to the "Links" section for chest pain algorithms and additional  guidance. Performed at [facility], [place] [address] [place], [place] [address]   Brain natriuretic peptide     Status: Abnormal   Collection Time: 06/30/22  2:10 PM  Result Value Ref Range   B Natriuretic Peptide 831.2 (H) 0.0 - 100.0 pg/mL    Comment: Performed at [facility] [address], [place], [place] [address]   ECHOCARDIOGRAM LIMITED  Result Date: 06/30/2022    ECHOCARDIOGRAM LIMITED REPORT   Patient Name:   JENNEFER KOPP Date of Exam: 06/30/2022 Medical Rec #:  [id number]      Height:       70.0 in Accession #:    [id number]     Weight:       200.0 lb Date of Birth:  07-Aug-1930       BSA:          2.087 m Patient Age:    20 years       BP:           114/81 mmHg Patient Gender: F              HR:           101 bpm. Exam Location:  Inpatient Procedure: Limited Echo                       STAT ECHO Reported to: Dr Loralie Champagne on 06/30/2022 12:00:00 AM          Dr. Lake Bells O'Neal bedside for echo. Indications:    Pericardial effusion  History:        Patient has prior history of Echocardiogram examinations, most                 recent 06/23/2022.  Sonographer:    Clayton Lefort RDCS (AE) Referring Phys: [phone] HAO MENG IMPRESSIONS  1. Large pericardial effusion. The pericardial effusion is circumferential. The IVC is dilated though the RV does not appear to show diastolic collapse. No respirometry. I am concerned for early tamponade.  2. Left ventricular ejection fraction, by estimation, is 60 to 65%. The left ventricle has normal function. The left ventricle has no regional wall motion abnormalities. Left ventricular diastolic parameters are indeterminate.  3. Right ventricular systolic function is normal. The right ventricular  size is normal.  4. The aortic valve is tricuspid. There is severe calcifcation of the aortic valve. not fully evaluated.  5. The inferior vena cava is dilated in size with <50% respiratory variability, suggesting right atrial pressure of 15 mmHg.  6. The patient was in atrial fibrillation. FINDINGS  Left Ventricle: Left ventricular ejection fraction, by estimation, is 60 to 65%. The left ventricle has normal function. The left ventricle has no regional wall motion abnormalities. Left ventricular diastolic parameters are indeterminate. Right Ventricle: The right ventricular size is normal. No increase in right ventricular wall thickness. Right ventricular systolic function is normal. Pericardium: A large pericardial effusion is present. The pericardial effusion is circumferential. Aortic Valve: The aortic valve is tricuspid. There is severe calcifcation of the aortic valve. Not fully evaluated. Venous: The inferior vena cava is dilated in size with less than 50% respiratory variability, suggesting right atrial pressure of 15 mmHg. IVC IVC diam: 3.00 cm Dalton McleanMD Electronically signed by Franki Monte  Signature Date/Time: 06/30/2022/3:08:41 PM    Final    CT Abdomen Pelvis W Contrast  Result Date: 06/30/2022 CLINICAL DATA:  Nausea and emesis. Abdominal distension. Pallor. Abdominal pain. EXAM: CT ABDOMEN AND PELVIS WITH CONTRAST TECHNIQUE: Multidetector CT imaging of the abdomen and pelvis was performed using the standard protocol following bolus administration of intravenous contrast. RADIATION DOSE REDUCTION: This exam was performed according to the departmental dose-optimization program which includes automated exposure control, adjustment of the mA and/or kV according to patient size and/or use of iterative reconstruction technique. CONTRAST:  187m OMNIPAQUE IOHEXOL 350 MG/ML SOLN COMPARISON:  Multiple exams, including CT chest 10/16/2019 FINDINGS: Lower chest: Large pericardial effusion, not present on 10/16/2019. Pacer leads noted. Mild to moderate cardiomegaly. Aortic valve calcification. Thoracic aortic atherosclerotic calcification. Small to moderate hiatal hernia. Fluid distention of the distal esophagus. Small to moderate bilateral pleural effusions with passive atelectasis. Mild scarring or atelectasis in the right middle lobe and lingula. Hepatobiliary: Accentuated density in the gallbladder, probably from sludge. Mildly accentuated enhancement in the wall of the common hepatic duct and common bile duct. No intrahepatic biliary dilatation. Indistinct stranding in the porta hepatis. Pancreas: Hypodense pancreatic tail lesion, 2.4 by 1.6 cm on image 36 series 3, internal density 9 Hounsfield units. Fluid density lesion along the inferior margin of the pancreatic body, 3.0 by 1.6 cm on image 39 series 3, internal density 2 Hounsfield units. Questionable prominence of the dorsal and ventral pancreatic duct in the pancreatic head. Possible uncinate process cystic lesion, 1.6 by 0.8 cm on image 40 series 3. Spleen: Unremarkable Adrenals/Urinary Tract: Multiple bilateral moderate to large sized benign  renal cysts. 1.8 by 1.4 cm focus of hypoenhancement in the renal parenchyma adjacent to the posterior renal cyst of the left kidney upper pole on image 14 series 8 and image 39 series 3. Urinary bladder unremarkable. The adrenal glands appear normal. Stomach/Bowel: Sigmoid colon diverticulosis, no compelling findings of active diverticulitis. Vascular/Lymphatic: Atherosclerosis is present, including aortoiliac atherosclerotic disease. There is atheromatous plaque proximally in the celiac trunk and SMA without overt occlusion. Reproductive: Unremarkable Other: Low-level edema along the root of the mesentery. Infiltrative edema in the porta hepatis. Musculoskeletal: Prominent degenerative hip arthropathy bilaterally. Bony demineralization. Left sacral nerve stimulator device. Old healed right medial eleventh rib fracture. Dextroconvex lumbar scoliosis. Mild rectus diastasis and anterior abdominal wall laxity. Lumbar spondylosis and degenerative disc disease contribute to multilevel foraminal stenosis and central narrowing of the thecal sac IMPRESSION: 1. Very large pericardial effusion, not  present on 10/16/2019. Correlate with physical exam findings such as Becks triad in assessing for signs of tamponade, echocardiography may be warranted in further workup. 2. Inflammatory stranding in the porta hepatis some accentuated enhancement along the non dilated extrahepatic biliary tree. Cannot exclude cholangitis. 3. Focal hypoenhancement in the upper medial right kidney renal parenchyma, potentially from pyelonephritis or infiltrative malignancy. Correlate with urine analysis in assessing for signs of pyelonephritis. 4. Multiple hypodense, probably cystic lesions of the pancreas. The largest measures 3.0 cm in long axis. In light of the size the lesions and the patient's age, follow up pancreatic protocol CT or MRI is recommended in 2 years time. The patient does have a pacer and a sacral nerve stimulator which may  preclude MRI. This recommendation follows ACR consensus guidelines: Management of Incidental Pancreatic Cysts: A White Paper of the ACR Incidental Findings Committee. [place] 2841;32:440-102. 5. Small to moderate bilateral pleural effusions with passive atelectasis. 6. Other imaging findings of potential clinical significance: Aortic valve calcification. Aortic Atherosclerosis (ICD10-I70.0). Small to moderate hiatal hernia. Multiple bilateral moderate to large sized renal cysts. Sigmoid colon diverticulosis. Systemic atherosclerosis. Prominent degenerative hip arthropathy bilaterally. Dextroconvex lumbar scoliosis. Mild rectus diastasis. Bony demineralization. Multilevel lumbar impingement due to spondylosis and degenerative disc disease. Electronically Signed   By: Van Clines M.D.   On: 06/30/2022 13:07   DG Abd Acute W/Chest  Result Date: 06/30/2022 CLINICAL DATA:  Emesis EXAM: DG ABDOMEN ACUTE WITH 1 VIEW CHEST COMPARISON:  Radiographs 06/22/2022 FINDINGS: Mild gaseous distention of the cecum/right colon at the upper limits of normal. No dilation of the remaining colon or small. No radiopaque calculi or other significant radiographic abnormality is seen. Heart size and mediastinal contours are within normal limits. Sacral stimulator. Enlarged cardiomediastinal silhouette. Atelectasis/consolidation in the left lower lung. Scattered areas of linear atelectasis/scarring. Possible small left pleural effusion. Aortic atherosclerotic calcification. Advanced thoracolumbar spondylosis. IMPRESSION: 1. Negative abdominal radiographs. 2. Presumed atelectasis in the left lower lung though pneumonia is difficult to exclude. Electronically Signed   By: Placido Sou M.D.   On: 06/30/2022 11:25    Pending Labs Unresulted Labs (From admission, onward)     Start     Ordered   07/01/22 7253  Basic metabolic panel  Daily at 5am,   R     Comments: As Scheduled for 5 days    06/30/22 1524   07/01/22  0500  Hepatic function panel  Tomorrow morning,   R        06/30/22 1524   06/30/22 1530  Sedimentation rate  Once,   R        06/30/22 1530   06/30/22 1431  C-reactive protein  Once,   URGENT        06/30/22 1430   06/30/22 1431  TSH  Once,   URGENT        06/30/22 1430   06/30/22 1322  Urinalysis, Routine w reflex microscopic  Once,   URGENT        06/30/22 1321            Vitals/Pain Today's Vitals   06/30/22 1500 06/30/22 1515 06/30/22 1516 06/30/22 1517  BP: (!) 149/108 (!) 157/107    Pulse: (!) 123 (!) 102 (!) 110 97  Resp: (!) 25 (!) 28 (!) 21 19  Temp:      TempSrc:      SpO2: 94% 95% 94% 93%  Weight:      Height:  PainSc:        Isolation Precautions No active isolations  Medications Medications  furosemide (LASIX) injection 40 mg (40 mg Intravenous Given 06/30/22 1448)  traMADol (ULTRAM) tablet 50 mg (has no administration in time range)  cephALEXin (KEFLEX) capsule 250 mg (has no administration in time range)  amiodarone (PACERONE) tablet 200 mg (has no administration in time range)  atenolol (TENORMIN) tablet 25 mg (has no administration in time range)  rosuvastatin (CRESTOR) tablet 20 mg (has no administration in time range)  metoprolol tartrate (LOPRESSOR) injection 2.5 mg (has no administration in time range)  FLUoxetine (PROZAC) capsule 20 mg (has no administration in time range)  FLUoxetine (PROZAC) capsule 40 mg (has no administration in time range)  levothyroxine (SYNTHROID) tablet 88 mcg (has no administration in time range)  alum & mag hydroxide-simeth (MAALOX/MYLANTA) 200-200-20 MG/5ML suspension 15 mL (has no administration in time range)  omeprazole (PRILOSEC OTC) EC tablet 20 mg (has no administration in time range)  Vitamin D3 TABS 5,000 Units (has no administration in time range)  Omega-3 Fish Oil CAPS 2,400 mg (has no administration in time range)  sodium chloride flush (NS) 0.9 % injection 3 mL (has no administration in time range)   sodium chloride flush (NS) 0.9 % injection 3 mL (has no administration in time range)  0.9 %  sodium chloride infusion (has no administration in time range)  acetaminophen (TYLENOL) tablet 650 mg (has no administration in time range)  ondansetron (ZOFRAN) injection 4 mg (has no administration in time range)  heparin injection 5,000 Units (has no administration in time range)  sodium chloride 0.9 % bolus 500 mL (0 mLs Intravenous Stopped 06/30/22 1224)  ondansetron (ZOFRAN) injection 4 mg (4 mg Intravenous Given 06/30/22 1052)  iohexol (OMNIPAQUE) 350 MG/ML injection 100 mL (100 mLs Intravenous Contrast Given 06/30/22 1252)    Mobility walks with device Low fall risk   Focused Assessments  R Recommendations: See Admitting Provider Note  Report given to:   Additional Notes: Pt is on a Purewick, daughter is at bedside.

## 2022-06-30 NOTE — ED Notes (Signed)
Pt transported to CT ?

## 2022-06-30 NOTE — ED Triage Notes (Addendum)
Pt BIB EMS from [facility], [place] for nausea and emesis. Per facility, pt's pale and has abdominal distention. Pt denies any pain, last BM this morning. No dark stools or blood in emesis. Pt has a history of A-fib.   EMS Vitals CBG 173 140/80 A-fib 60-120 95% RA

## 2022-06-30 NOTE — ED Notes (Signed)
Pt transported to XR.  

## 2022-07-01 ENCOUNTER — Inpatient Hospital Stay (HOSPITAL_COMMUNITY): Payer: Medicare Other

## 2022-07-01 ENCOUNTER — Encounter (HOSPITAL_COMMUNITY)
Admission: EM | Disposition: A | Discharge status: Skilled Nursing Facility | Payer: Self-pay | Source: Skilled Nursing Facility | Attending: Internal Medicine

## 2022-07-01 ENCOUNTER — Encounter: Payer: Self-pay | Admitting: Internal Medicine

## 2022-07-01 ENCOUNTER — Encounter: Payer: Self-pay | Admitting: Nurse Practitioner

## 2022-07-01 DIAGNOSIS — N1831 Chronic kidney disease, stage 3a: Secondary | ICD-10-CM | POA: Diagnosis not present

## 2022-07-01 DIAGNOSIS — I300 Acute nonspecific idiopathic pericarditis: Secondary | ICD-10-CM | POA: Diagnosis not present

## 2022-07-01 DIAGNOSIS — I10 Essential (primary) hypertension: Secondary | ICD-10-CM

## 2022-07-01 DIAGNOSIS — I5033 Acute on chronic diastolic (congestive) heart failure: Secondary | ICD-10-CM | POA: Diagnosis not present

## 2022-07-01 DIAGNOSIS — I3139 Other pericardial effusion (noninflammatory): Secondary | ICD-10-CM

## 2022-07-01 DIAGNOSIS — E876 Hypokalemia: Secondary | ICD-10-CM

## 2022-07-01 DIAGNOSIS — E785 Hyperlipidemia, unspecified: Secondary | ICD-10-CM

## 2022-07-01 DIAGNOSIS — I314 Cardiac tamponade: Secondary | ICD-10-CM

## 2022-07-01 DIAGNOSIS — I309 Acute pericarditis, unspecified: Secondary | ICD-10-CM | POA: Diagnosis not present

## 2022-07-01 DIAGNOSIS — E039 Hypothyroidism, unspecified: Secondary | ICD-10-CM

## 2022-07-01 HISTORY — PX: PERICARDIOCENTESIS: CATH118255

## 2022-07-01 LAB — CBC
HCT: 30.9 % — ABNORMAL LOW (ref 36.0–46.0)
Hemoglobin: 10 g/dL — ABNORMAL LOW (ref 12.0–15.0)
MCH: 26.2 pg (ref 26.0–34.0)
MCHC: 32.4 g/dL (ref 30.0–36.0)
MCV: 80.9 fL (ref 80.0–100.0)
Platelets: 426 10*3/uL — ABNORMAL HIGH (ref 150–400)
RBC: 3.82 MIL/uL — ABNORMAL LOW (ref 3.87–5.11)
RDW: 15.9 % — ABNORMAL HIGH (ref 11.5–15.5)
WBC: 18 10*3/uL — ABNORMAL HIGH (ref 4.0–10.5)
nRBC: 0 % (ref 0.0–0.2)

## 2022-07-01 LAB — HEPATIC FUNCTION PANEL
ALT: 48 U/L — ABNORMAL HIGH (ref 0–44)
AST: 38 U/L (ref 15–41)
Albumin: 3.1 g/dL — ABNORMAL LOW (ref 3.5–5.0)
Alkaline Phosphatase: 88 U/L (ref 38–126)
Bilirubin, Direct: 0.2 mg/dL (ref 0.0–0.2)
Indirect Bilirubin: 0.4 mg/dL (ref 0.3–0.9)
Total Bilirubin: 0.6 mg/dL (ref 0.3–1.2)
Total Protein: 6.3 g/dL — ABNORMAL LOW (ref 6.5–8.1)

## 2022-07-01 LAB — ECHOCARDIOGRAM LIMITED
Height: 68 in
Height: 68 in
Weight: 3321.6 oz
Weight: 3321.6 oz

## 2022-07-01 LAB — BASIC METABOLIC PANEL
Anion gap: 10 (ref 5–15)
BUN: 16 mg/dL (ref 8–23)
CO2: 24 mmol/L (ref 22–32)
Calcium: 8.7 mg/dL — ABNORMAL LOW (ref 8.9–10.3)
Chloride: 98 mmol/L (ref 98–111)
Creatinine, Ser: 1.37 mg/dL — ABNORMAL HIGH (ref 0.44–1.00)
GFR, Estimated: 37 mL/min — ABNORMAL LOW (ref >60)
Glucose, Bld: 155 mg/dL — ABNORMAL HIGH (ref 70–99)
Potassium: 3.3 mmol/L — ABNORMAL LOW (ref 3.5–5.1)
Sodium: 132 mmol/L — ABNORMAL LOW (ref 135–145)

## 2022-07-01 LAB — GRAM STAIN

## 2022-07-01 LAB — BODY FLUID CELL COUNT WITH DIFFERENTIAL
Eos, Fluid: 0 %
Lymphs, Fluid: 18 %
Monocyte-Macrophage-Serous Fluid: 3 % — ABNORMAL LOW (ref 50–90)
Neutrophil Count, Fluid: 79 % — ABNORMAL HIGH (ref 0–25)
Total Nucleated Cell Count, Fluid: 989 cu mm (ref 0–1000)

## 2022-07-01 SURGERY — PERICARDIOCENTESIS
Anesthesia: LOCAL

## 2022-07-01 MED ORDER — HEPARIN (PORCINE) IN NACL 1000-0.9 UT/500ML-% IV SOLN
INTRAVENOUS | Status: AC
  Filled 2022-07-01: qty 500

## 2022-07-01 MED ORDER — MIDAZOLAM HCL 2 MG/2ML IJ SOLN
INTRAMUSCULAR | Status: AC
  Filled 2022-07-01: qty 2

## 2022-07-01 MED ORDER — POTASSIUM CHLORIDE CRYS ER 20 MEQ PO TBCR
40.0000 meq | EXTENDED_RELEASE_TABLET | Freq: Once | ORAL | Status: AC
  Administered 2022-07-01: 40 meq via ORAL
  Filled 2022-07-01: qty 2

## 2022-07-01 MED ORDER — LIDOCAINE HCL (PF) 1 % IJ SOLN
INTRAMUSCULAR | Status: AC
  Filled 2022-07-01: qty 30

## 2022-07-01 MED ORDER — IOHEXOL 350 MG/ML SOLN
INTRAVENOUS | Status: DC | PRN
  Administered 2022-07-01: 2 mL

## 2022-07-01 MED ORDER — SODIUM CHLORIDE 0.9% FLUSH: 3.0000 mL | INTRAVENOUS | Status: DC | PRN

## 2022-07-01 MED ORDER — SODIUM CHLORIDE 0.9 % IV SOLN: 250.0000 mL | INTRAVENOUS | Status: DC | PRN

## 2022-07-01 MED ORDER — TRAMADOL HCL 50 MG PO TABS
50.0000 mg | ORAL_TABLET | Freq: Once | ORAL | Status: AC
  Administered 2022-07-01: 50 mg via ORAL
  Filled 2022-07-01: qty 1

## 2022-07-01 MED ORDER — MIDAZOLAM HCL 2 MG/2ML IJ SOLN
INTRAMUSCULAR | Status: DC | PRN
  Administered 2022-07-01: 1 mg via INTRAVENOUS

## 2022-07-01 MED ORDER — LIDOCAINE HCL (PF) 1 % IJ SOLN
INTRAMUSCULAR | Status: DC | PRN
  Administered 2022-07-01: 15 mL

## 2022-07-01 MED ORDER — HEPARIN (PORCINE) IN NACL 1000-0.9 UT/500ML-% IV SOLN
INTRAVENOUS | Status: DC | PRN
  Administered 2022-07-01: 500 mL

## 2022-07-01 MED ORDER — SODIUM CHLORIDE 0.9 % IV SOLN: INTRAVENOUS | Status: DC

## 2022-07-01 MED ORDER — SODIUM CHLORIDE 0.9% FLUSH
3.0000 mL | Freq: Two times a day (BID) | INTRAVENOUS | Status: DC
Start: 2022-07-01 — End: 2022-07-01

## 2022-07-01 SURGICAL SUPPLY — 8 items
CATH ANGIO 5F PIGTAIL 65CM (CATHETERS) ×1 IMPLANT
ELECT DEFIB PAD ADLT CADENCE (PAD) ×1 IMPLANT
KIT MICROPUNCTURE NIT STIFF (SHEATH) ×1 IMPLANT
TRANSDUCER W/STOPCOCK (MISCELLANEOUS) ×1 IMPLANT
TRAY PERICARDIOCENTESIS 6FX60 (TRAY / TRAY PROCEDURE) ×1 IMPLANT
TUBING ART PRESS 72  MALE/FEM (TUBING) ×1
TUBING ART PRESS 72 MALE/FEM (TUBING) IMPLANT
WIRE ROSEN-J .035X180CM (WIRE) ×1 IMPLANT

## 2022-07-01 NOTE — Progress Notes (Signed)
  Echocardiogram 2D Echocardiogram has been performed.  Faith Parker 07/01/2022, 9:09 AM

## 2022-07-01 NOTE — Progress Notes (Signed)
Heart Failure Navigator Progress Note  Assessed for Heart & Vascular TOC clinic readiness.  Patient does not meet criteria due to has cardiology follow up on 07/08/2022 with Dr. Wynonia Lawman.     Earnestine Leys, BSN, Clinical cytogeneticist Only

## 2022-07-01 NOTE — Progress Notes (Signed)
PT Cancellation Note  Patient Details Name: Faith Parker MRN: [id number] DOB: [birth year]   Cancelled Treatment:    Reason Eval/Treat Not Completed: Patient at procedure or test/unavailable. Pt off unit for pericardiocentesis. PT will follow up once procedure is complete and pt is appropriate to mobilize.   Zenaida Niece 07/01/2022, 12:38 PM

## 2022-07-01 NOTE — H&P (View-Only) (Signed)
Progress Note  Patient Name: Faith Parker Date of Encounter: 07/01/2022  [facility] Cardiologist: Dorris Carnes, MD   Subjective   No CP, mild nausea and dyspnea  Inpatient Medications    Scheduled Meds:  amiodarone  200 mg Oral BID   atenolol  25 mg Oral Daily   cephALEXin  250 mg Oral Daily   cholecalciferol  5,000 Units Oral Daily   colchicine  0.6 mg Oral Q48H   FLUoxetine  20 mg Oral Daily   FLUoxetine  40 mg Oral Daily   furosemide  40 mg Intravenous BID   heparin  5,000 Units Subcutaneous Q12H   levothyroxine  88 mcg Oral Q0600   omega-3 acid ethyl esters  2,000 mg Oral Daily   pantoprazole  40 mg Oral Daily   predniSONE  20 mg Oral Q breakfast   rosuvastatin  20 mg Oral QPM   sodium chloride flush  3 mL Intravenous Q12H   traMADol  50 mg Oral Daily   Continuous Infusions:  sodium chloride     PRN Meds: sodium chloride, acetaminophen, alum & mag hydroxide-simeth, metoprolol tartrate, ondansetron (ZOFRAN) IV, sodium chloride flush   Vital Signs    Vitals:   07/01/22 0030 07/01/22 0425 07/01/22 0426 07/01/22 0829  BP: (!) 163/101 (!) 156/93  (!) 158/97  Pulse: (!) 103 (!) 103  (!) 116  Resp: (!) 22 20  (!) 22  Temp: 98.1 F (36.7 C) 98.5 F (36.9 C) 98.5 F (36.9 C) 97.6 F (36.4 C)  TempSrc: Oral  Oral Oral  SpO2: 92% 94%  92%  Weight:   91.1 kg   Height:        Intake/Output Summary (Last 24 hours) at 07/01/2022 0959 Last data filed at 07/01/2022 0427 Gross per 24 hour  Intake 857.57 ml  Output 700 ml  Net 157.57 ml      07/01/2022    8:31 AM 07/01/2022    4:26 AM 06/30/2022    4:54 PM  Last 3 Weights  Weight (lbs) 207 lb 9.6 oz 200 lb 13.4 oz 206 lb 12.7 oz  Weight (kg) 94.167 kg 91.1 kg 93.8 kg      Telemetry    Atrial fibrillation rate controlled - Personally Reviewed   Physical Exam   GEN: No acute distress.   Neck: positive JVD Cardiac: irregular Respiratory: Diminished BS bilaterally GI: Soft, nontender, non-distended   MS: No edema Neuro:  Nonfocal  Psych: Normal affect   Labs    High Sensitivity Troponin:   Recent Labs  Lab 06/22/22 0435 06/22/22 0630 06/30/22 1049 06/30/22 1223  TROPONINIHS '9 9 9 9     '$ Chemistry Recent Labs  Lab 06/25/22 0201 06/30/22 1049 07/01/22 0212  NA 135 132* 132*  K 4.0 3.6 3.3*  CL 101 99 98  CO2 '27 23 24  '$ GLUCOSE 97 165* 155*  BUN '15 16 16  '$ CREATININE 1.12* 1.39* 1.37*  CALCIUM 8.0* 8.6* 8.7*  PROT  --  6.1* 6.3*  ALBUMIN  --  3.0* 3.1*  AST  --  48* 38  ALT  --  50* 48*  ALKPHOS  --  88 88  BILITOT  --  0.8 0.6  GFRNONAA 47* 36* 37*  ANIONGAP '7 10 10    '$ Hematology Recent Labs  Lab 06/30/22 1049 07/01/22 0212  WBC 18.1* 18.0*  RBC 3.80* 3.82*  HGB 10.2* 10.0*  HCT 31.3* 30.9*  MCV 82.4 80.9  MCH 26.8 26.2  MCHC 32.6 32.4  RDW 15.9* 15.9*  PLT 383 426*   Thyroid  Recent Labs  Lab 06/30/22 1448  TSH 3.665    BNP Recent Labs  Lab 06/30/22 1410  BNP 831.2*      Radiology    ECHOCARDIOGRAM LIMITED  Result Date: 06/30/2022    ECHOCARDIOGRAM LIMITED REPORT   Patient Name:   DEALVA LAFOY Date of Exam: 06/30/2022 Medical Rec #:  [id number]      Height:       70.0 in Accession #:    [id number]     Weight:       200.0 lb Date of Birth:  August 31, 1930       BSA:          2.087 m Patient Age:    88 years       BP:           114/81 mmHg Patient Gender: F              HR:           101 bpm. Exam Location:  Inpatient Procedure: Limited Echo                       STAT ECHO Reported to: Dr Loralie Champagne on 06/30/2022 12:00:00 AM          Dr. Lake Bells O'Neal bedside for echo. Indications:    Pericardial effusion  History:        Patient has prior history of Echocardiogram examinations, most                 recent 06/23/2022.  Sonographer:    Clayton Lefort RDCS (AE) Referring Phys: [phone] HAO MENG IMPRESSIONS  1. Large pericardial effusion. The pericardial effusion is circumferential. The IVC is dilated though the RV does not appear to show diastolic collapse.  No respirometry. I am concerned for early tamponade.  2. Left ventricular ejection fraction, by estimation, is 60 to 65%. The left ventricle has normal function. The left ventricle has no regional wall motion abnormalities. Left ventricular diastolic parameters are indeterminate.  3. Right ventricular systolic function is normal. The right ventricular size is normal.  4. The aortic valve is tricuspid. There is severe calcifcation of the aortic valve. not fully evaluated.  5. The inferior vena cava is dilated in size with <50% respiratory variability, suggesting right atrial pressure of 15 mmHg.  6. The patient was in atrial fibrillation. FINDINGS  Left Ventricle: Left ventricular ejection fraction, by estimation, is 60 to 65%. The left ventricle has normal function. The left ventricle has no regional wall motion abnormalities. Left ventricular diastolic parameters are indeterminate. Right Ventricle: The right ventricular size is normal. No increase in right ventricular wall thickness. Right ventricular systolic function is normal. Pericardium: A large pericardial effusion is present. The pericardial effusion is circumferential. Aortic Valve: The aortic valve is tricuspid. There is severe calcifcation of the aortic valve. Not fully evaluated. Venous: The inferior vena cava is dilated in size with less than 50% respiratory variability, suggesting right atrial pressure of 15 mmHg. IVC IVC diam: 3.00 cm Dalton McleanMD Electronically signed by Franki Monte Signature Date/Time: 06/30/2022/3:08:41 PM    Final    CT Abdomen Pelvis W Contrast  Result Date: 06/30/2022 CLINICAL DATA:  Nausea and emesis. Abdominal distension. Pallor. Abdominal pain. EXAM: CT ABDOMEN AND PELVIS WITH CONTRAST TECHNIQUE: Multidetector CT imaging of the abdomen and pelvis was performed using the standard protocol following bolus administration of intravenous contrast.  RADIATION DOSE REDUCTION: This exam was performed according to the  departmental dose-optimization program which includes automated exposure control, adjustment of the mA and/or kV according to patient size and/or use of iterative reconstruction technique. CONTRAST:  142m OMNIPAQUE IOHEXOL 350 MG/ML SOLN COMPARISON:  Multiple exams, including CT chest 10/16/2019 FINDINGS: Lower chest: Large pericardial effusion, not present on 10/16/2019. Pacer leads noted. Mild to moderate cardiomegaly. Aortic valve calcification. Thoracic aortic atherosclerotic calcification. Small to moderate hiatal hernia. Fluid distention of the distal esophagus. Small to moderate bilateral pleural effusions with passive atelectasis. Mild scarring or atelectasis in the right middle lobe and lingula. Hepatobiliary: Accentuated density in the gallbladder, probably from sludge. Mildly accentuated enhancement in the wall of the common hepatic duct and common bile duct. No intrahepatic biliary dilatation. Indistinct stranding in the porta hepatis. Pancreas: Hypodense pancreatic tail lesion, 2.4 by 1.6 cm on image 36 series 3, internal density 9 Hounsfield units. Fluid density lesion along the inferior margin of the pancreatic body, 3.0 by 1.6 cm on image 39 series 3, internal density 2 Hounsfield units. Questionable prominence of the dorsal and ventral pancreatic duct in the pancreatic head. Possible uncinate process cystic lesion, 1.6 by 0.8 cm on image 40 series 3. Spleen: Unremarkable Adrenals/Urinary Tract: Multiple bilateral moderate to large sized benign renal cysts. 1.8 by 1.4 cm focus of hypoenhancement in the renal parenchyma adjacent to the posterior renal cyst of the left kidney upper pole on image 14 series 8 and image 39 series 3. Urinary bladder unremarkable. The adrenal glands appear normal. Stomach/Bowel: Sigmoid colon diverticulosis, no compelling findings of active diverticulitis. Vascular/Lymphatic: Atherosclerosis is present, including aortoiliac atherosclerotic disease. There is atheromatous  plaque proximally in the celiac trunk and SMA without overt occlusion. Reproductive: Unremarkable Other: Low-level edema along the root of the mesentery. Infiltrative edema in the porta hepatis. Musculoskeletal: Prominent degenerative hip arthropathy bilaterally. Bony demineralization. Left sacral nerve stimulator device. Old healed right medial eleventh rib fracture. Dextroconvex lumbar scoliosis. Mild rectus diastasis and anterior abdominal wall laxity. Lumbar spondylosis and degenerative disc disease contribute to multilevel foraminal stenosis and central narrowing of the thecal sac IMPRESSION: 1. Very large pericardial effusion, not present on 10/16/2019. Correlate with physical exam findings such as Becks triad in assessing for signs of tamponade, echocardiography may be warranted in further workup. 2. Inflammatory stranding in the porta hepatis some accentuated enhancement along the non dilated extrahepatic biliary tree. Cannot exclude cholangitis. 3. Focal hypoenhancement in the upper medial right kidney renal parenchyma, potentially from pyelonephritis or infiltrative malignancy. Correlate with urine analysis in assessing for signs of pyelonephritis. 4. Multiple hypodense, probably cystic lesions of the pancreas. The largest measures 3.0 cm in long axis. In light of the size the lesions and the patient's age, follow up pancreatic protocol CT or MRI is recommended in 2 years time. The patient does have a pacer and a sacral nerve stimulator which may preclude MRI. This recommendation follows ACR consensus guidelines: Management of Incidental Pancreatic Cysts: A White Paper of the ACR Incidental Findings Committee. [id number];21:308-657 5. Small to moderate bilateral pleural effusions with passive atelectasis. 6. Other imaging findings of potential clinical significance: Aortic valve calcification. Aortic Atherosclerosis (ICD10-I70.0). Small to moderate hiatal hernia. Multiple bilateral moderate to  large sized renal cysts. Sigmoid colon diverticulosis. Systemic atherosclerosis. Prominent degenerative hip arthropathy bilaterally. Dextroconvex lumbar scoliosis. Mild rectus diastasis. Bony demineralization. Multilevel lumbar impingement due to spondylosis and degenerative disc disease. Electronically Signed   By: WCindra EvesD.  On: 06/30/2022 13:07   DG Abd Acute W/Chest  Result Date: 06/30/2022 CLINICAL DATA:  Emesis EXAM: DG ABDOMEN ACUTE WITH 1 VIEW CHEST COMPARISON:  Radiographs 06/22/2022 FINDINGS: Mild gaseous distention of the cecum/right colon at the upper limits of normal. No dilation of the remaining colon or small. No radiopaque calculi or other significant radiographic abnormality is seen. Heart size and mediastinal contours are within normal limits. Sacral stimulator. Enlarged cardiomediastinal silhouette. Atelectasis/consolidation in the left lower lung. Scattered areas of linear atelectasis/scarring. Possible small left pleural effusion. Aortic atherosclerotic calcification. Advanced thoracolumbar spondylosis. IMPRESSION: 1. Negative abdominal radiographs. 2. Presumed atelectasis in the left lower lung though pneumonia is difficult to exclude. Electronically Signed   By: Placido Sou M.D.   On: 06/30/2022 11:25     Patient Profile     88 y.o. female with past medical history of persistent atrial fibrillation, chronic diastolic congestive heart failure, history of sick sinus syndrome status post pacemaker, hypothyroidism admitted with leukocytosis, chest pain and dyspnea and found to have pericardial effusion.  Echocardiogram July 27 showed normal LV function, large pericardial effusion without RV diastolic collapse; there was concern for early tamponade.  Assessment & Plan    1 pericardial effusion-I have personally reviewed the patient's echocardiograms from July 20, July 27 and today.  She has a large pericardial effusion which has increased in size significantly  compared to July 20.  Her IVC is dilated and I am concerned with early tamponade.  Would favor proceeding with pericardiocentesis and patient agreeable.  We will arrange.  Etiology of effusion is unclear.  She denies recent viral symptoms.  Sent fluid for cell count, LDH, Gram stain, cultures and cytology.  Note she has not had apixaban in the past 36 hours.  I will hold Lasix in the setting of potential tamponade and we can resume after procedure if necessary.  Add colchicine.  2 persistent atrial fibrillation-continue amiodarone.  Eliquis is on hold for pending pericardiocentesis.  3 question cholangitis on CT scan-no right upper quadrant tenderness on examination.  We will leave this issue and possible pyelonephritis to primary service.  For questions or updates, please contact [place] Please consult [email or web address] for contact info under        Signed, Kirk Ruths, MD  07/01/2022, 9:59 AM

## 2022-07-01 NOTE — Hospital Course (Addendum)
Faith Parker was admitted to the hospital with the working diagnosis of pericardial effusion.  88 y.o. female with medical history significant of chronic A-fib, HTN, chronic HFpEF, hypothyroidism, recurrent UTI on suppression antibiotics, presented to the hospital with  with intermittent chest pain and shortness of breath.  Patient was recently hospitalized for similar problem and was treated for heart failure with fluid overload with aggressive diuresis.  Patient however continued to have intermittent chest pain and shortness of breath with nausea and vomiting. On her initial physical examination her blood pressure was 151/93, HR 119, RR 21 and 02 saturation 93%, lungs with no wheezing or rales, heart with S1 and S2 present irregularly irregular, abdomen not distended and no lower extremity edema.   Na 132, K 3,6 Cl 99 bicarbonate 23, glucose 165 bun 16 cr 1,39  Wbc 18,1 hgb 10.2 plt 383   Chest radiograph with right rotation and cardiomegaly, no infiltrates. Pacemaker in place with one atrial and one ventricular lead.   CT abdomen with very large pericardial effusion (new). Inflammatory stranding in the aorta hepatis and enhancement along the non dilated extrahepatic biliary tree. Focal hypo-enhancement in the upper medial right kidney renal parenchyma. Multiple cystic lesions in the pancreas. Small moderate bilateral pleural effusions.   EKG 109 bpm, normal axis, qtc 551, atrial fibrillation rhythm, with one pac and one ventricular paced beat, no significant ST segment or T wave changes. Low voltage but not pulsus alternans.   Anticoagulation was held.   Echocardiogram confirmed large pericardial effusion. Clinical concern for impeding tamponade. Patient underwent pericardiocentesis, draining 600 cc of bloody fluid.   Patient was placed on cholchicine and prednisone for acute pericarditis. Follow up echocardiogram with no signs of fluid re accumulation.   Patient will continue to hold on  anticoagulation with plan to follow up as outpatient Continue with prednisone and colchicine.

## 2022-07-01 NOTE — Progress Notes (Signed)
Patient has returned from cath lab following pericardiocentesis. 600cc of bloody fluid removed per report. Puncture site WNL.

## 2022-07-01 NOTE — Interval H&P Note (Signed)
History and Physical Interval Note:  07/01/2022 11:27 AM  Faith Parker  has presented today for surgery, with the diagnosis of tap.  The various methods of treatment have been discussed with the patient and family. After consideration of risks, benefits and other options for treatment, the patient has consented to  Procedure(s): PERICARDIOCENTESIS (N/A) as a surgical intervention.  The patient's history has been reviewed, patient examined, no change in status, stable for surgery.  I have reviewed the patient's chart and labs.  Questions were answered to the patient's satisfaction.     Kathlyn Sacramento

## 2022-07-01 NOTE — Progress Notes (Signed)
This encounter was created in error - please disregard.

## 2022-07-01 NOTE — Evaluation (Signed)
Clinical/Bedside Swallow Evaluation Patient Details  Name: Faith Parker MRN: [id number] Date of Birth: 1930/04/09  Today's Date: 07/01/2022 Time: SLP Start Time (ACUTE ONLY): 0935 SLP Stop Time (ACUTE ONLY): 3790 SLP Time Calculation (min) (ACUTE ONLY): 17 min  Past Medical History:  Past Medical History:  Diagnosis Date   Anxiety    Aortic regurgitation    Atrial fibrillation (HCC)    Chest pain    Per new patient form   Gastritis    GERD (gastroesophageal reflux disease)    H/O artificial eye lens    Hypertension    Hypothyroid    Left ventricular hypertrophy    Major depression    Osteoarthritis    Osteoporosis    Pacemaker    On Eliquis, Per new patient form   Peptic ulcer disease    Stress incontinence    Past Surgical History:  Past Surgical History:  Procedure Laterality Date   ABDOMINAL SURGERY     Per new patient form   BLADDER SURGERY     DG  BONE DENSITY ([place] HX)     Per new patient form   EYE SURGERY     KNEE ARTHROPLASTY     LEFT HEART CATH AND CORONARY ANGIOGRAPHY N/A 10/22/2019   Procedure: LEFT HEART CATH AND CORONARY ANGIOGRAPHY;  Surgeon: Sherren Mocha, MD;  Location: [facility];  Service: Cardiovascular;  Laterality: N/A;   LEG SURGERY Right    multiple surgeries, unknown type   PACEMAKER IMPLANT N/A 06/04/2018   Procedure: PACEMAKER IMPLANT;  Surgeon: Deboraha Sprang, MD;  Location: [facility];  Service: Cardiovascular;  Laterality: N/A;   HPI:  Faith Parker is a 88 y.o. female who presented with chest pain, acute on chronic CHF.  PMHx afib; HTN; hypothyroidism;  pacemaker placement.    Assessment / Plan / Recommendation  Clinical Impression  Pt participated in clinical swallowing assessment. She did not present with s/s of an oropharyngeal dysphagia - oral attention and control were WNL; there were no s/s of aspiration. However, she reports frequent regurgitation of undigested food and early satiety.  Recommend esophagram  to identify potential dysmotility or stricture.  Pt/daughter at bedside agree with plan.  SLP will follow for results/recommendations. SLP Visit Diagnosis: Dysphagia, unspecified (R13.10)    Aspiration Risk  No limitations    Diet Recommendation   Continue current diet- reg solids, thin liquids  Medication Administration: Whole meds with liquid    Other  Recommendations Recommended Consults: Consider esophageal assessment Oral Care Recommendations: Oral care BID    Recommendations for follow up therapy are one component of a multi-disciplinary discharge planning process, led by the attending physician.  Recommendations may be updated based on patient status, additional functional criteria and insurance authorization.  Follow up Recommendations Other (comment) (tba)      Assistance Recommended at Discharge None    Swallow Study   General HPI: Faith Parker is a 88 y.o. female who presented with chest pain, acute on chronic CHF.  PMHx afib; HTN; hypothyroidism;  pacemaker placement. Type of Study: Bedside Swallow Evaluation Previous Swallow Assessment: no Diet Prior to this Study: Regular;Thin liquids Temperature Spikes Noted: No History of Recent Intubation: No Behavior/Cognition: Alert;Cooperative;Pleasant mood Oral Cavity Assessment: Within Functional Limits Oral Care Completed by SLP: Recent completion by staff Oral Cavity - Dentition: Adequate natural dentition Vision: Functional for self-feeding Self-Feeding Abilities: Able to feed self Patient Positioning: Upright in bed Baseline Vocal Quality: Normal Volitional Cough: Strong Volitional  Swallow: Able to elicit    Oral/Motor/Sensory Function Overall Oral Motor/Sensory Function: Within functional limits   Ice Chips Ice chips: Within functional limits   Thin Liquid Thin Liquid: Within functional limits    Nectar Thick Nectar Thick Liquid: Not tested   Honey Thick Honey Thick Liquid: Not tested   Puree Puree: Within  functional limits   Solid     Solid: Within functional limits      Faith Parker 07/01/2022,9:53 AM  Estill Bamberg L. Tivis Ringer, MA CCC/SLP Clinical Specialist - [place] Office number [phone]

## 2022-07-01 NOTE — Progress Notes (Signed)
Progress Note  Patient Name: Faith Parker Date of Encounter: 07/01/2022  [facility] Cardiologist: Dorris Carnes, MD   Subjective   No CP, mild nausea and dyspnea  Inpatient Medications    Scheduled Meds:  amiodarone  200 mg Oral BID   atenolol  25 mg Oral Daily   cephALEXin  250 mg Oral Daily   cholecalciferol  5,000 Units Oral Daily   colchicine  0.6 mg Oral Q48H   FLUoxetine  20 mg Oral Daily   FLUoxetine  40 mg Oral Daily   furosemide  40 mg Intravenous BID   heparin  5,000 Units Subcutaneous Q12H   levothyroxine  88 mcg Oral Q0600   omega-3 acid ethyl esters  2,000 mg Oral Daily   pantoprazole  40 mg Oral Daily   predniSONE  20 mg Oral Q breakfast   rosuvastatin  20 mg Oral QPM   sodium chloride flush  3 mL Intravenous Q12H   traMADol  50 mg Oral Daily   Continuous Infusions:  sodium chloride     PRN Meds: sodium chloride, acetaminophen, alum & mag hydroxide-simeth, metoprolol tartrate, ondansetron (ZOFRAN) IV, sodium chloride flush   Vital Signs    Vitals:   07/01/22 0030 07/01/22 0425 07/01/22 0426 07/01/22 0829  BP: (!) 163/101 (!) 156/93  (!) 158/97  Pulse: (!) 103 (!) 103  (!) 116  Resp: (!) 22 20  (!) 22  Temp: 98.1 F (36.7 C) 98.5 F (36.9 C) 98.5 F (36.9 C) 97.6 F (36.4 C)  TempSrc: Oral  Oral Oral  SpO2: 92% 94%  92%  Weight:   91.1 kg   Height:        Intake/Output Summary (Last 24 hours) at 07/01/2022 0959 Last data filed at 07/01/2022 0427 Gross per 24 hour  Intake 857.57 ml  Output 700 ml  Net 157.57 ml      07/01/2022    8:31 AM 07/01/2022    4:26 AM 06/30/2022    4:54 PM  Last 3 Weights  Weight (lbs) 207 lb 9.6 oz 200 lb 13.4 oz 206 lb 12.7 oz  Weight (kg) 94.167 kg 91.1 kg 93.8 kg      Telemetry    Atrial fibrillation rate controlled - Personally Reviewed   Physical Exam   GEN: No acute distress.   Neck: positive JVD Cardiac: irregular Respiratory: Diminished BS bilaterally GI: Soft, nontender, non-distended   MS: No edema Neuro:  Nonfocal  Psych: Normal affect   Labs    High Sensitivity Troponin:   Recent Labs  Lab 06/22/22 0435 06/22/22 0630 06/30/22 1049 06/30/22 1223  TROPONINIHS '9 9 9 9     '$ Chemistry Recent Labs  Lab 06/25/22 0201 06/30/22 1049 07/01/22 0212  NA 135 132* 132*  K 4.0 3.6 3.3*  CL 101 99 98  CO2 '27 23 24  '$ GLUCOSE 97 165* 155*  BUN '15 16 16  '$ CREATININE 1.12* 1.39* 1.37*  CALCIUM 8.0* 8.6* 8.7*  PROT  --  6.1* 6.3*  ALBUMIN  --  3.0* 3.1*  AST  --  48* 38  ALT  --  50* 48*  ALKPHOS  --  88 88  BILITOT  --  0.8 0.6  GFRNONAA 47* 36* 37*  ANIONGAP '7 10 10    '$ Hematology Recent Labs  Lab 06/30/22 1049 07/01/22 0212  WBC 18.1* 18.0*  RBC 3.80* 3.82*  HGB 10.2* 10.0*  HCT 31.3* 30.9*  MCV 82.4 80.9  MCH 26.8 26.2  MCHC 32.6 32.4  RDW 15.9* 15.9*  PLT 383 426*   Thyroid  Recent Labs  Lab 06/30/22 1448  TSH 3.665    BNP Recent Labs  Lab 06/30/22 1410  BNP 831.2*      Radiology    ECHOCARDIOGRAM LIMITED  Result Date: 06/30/2022    ECHOCARDIOGRAM LIMITED REPORT   Patient Name:   Faith Parker Date of Exam: 06/30/2022 Medical Rec #:  [id number]      Height:       70.0 in Accession #:    [id number]     Weight:       200.0 lb Date of Birth:  [birth year]       BSA:          2.087 m Patient Age:    64 years       BP:           114/81 mmHg Patient Gender: F              HR:           101 bpm. Exam Location:  Inpatient Procedure: Limited Echo                       STAT ECHO Reported to: Dr Loralie Champagne on 06/30/2022 12:00:00 AM          Dr. Lake Bells O'Neal bedside for echo. Indications:    Pericardial effusion  History:        Patient has prior history of Echocardiogram examinations, most                 recent 06/23/2022.  Sonographer:    Clayton Lefort RDCS (AE) Referring Phys: [phone] HAO MENG IMPRESSIONS  1. Large pericardial effusion. The pericardial effusion is circumferential. The IVC is dilated though the RV does not appear to show diastolic collapse.  No respirometry. I am concerned for early tamponade.  2. Left ventricular ejection fraction, by estimation, is 60 to 65%. The left ventricle has normal function. The left ventricle has no regional wall motion abnormalities. Left ventricular diastolic parameters are indeterminate.  3. Right ventricular systolic function is normal. The right ventricular size is normal.  4. The aortic valve is tricuspid. There is severe calcifcation of the aortic valve. not fully evaluated.  5. The inferior vena cava is dilated in size with <50% respiratory variability, suggesting right atrial pressure of 15 mmHg.  6. The patient was in atrial fibrillation. FINDINGS  Left Ventricle: Left ventricular ejection fraction, by estimation, is 60 to 65%. The left ventricle has normal function. The left ventricle has no regional wall motion abnormalities. Left ventricular diastolic parameters are indeterminate. Right Ventricle: The right ventricular size is normal. No increase in right ventricular wall thickness. Right ventricular systolic function is normal. Pericardium: A large pericardial effusion is present. The pericardial effusion is circumferential. Aortic Valve: The aortic valve is tricuspid. There is severe calcifcation of the aortic valve. Not fully evaluated. Venous: The inferior vena cava is dilated in size with less than 50% respiratory variability, suggesting right atrial pressure of 15 mmHg. IVC IVC diam: 3.00 cm Dalton McleanMD Electronically signed by Franki Monte Signature Date/Time: 06/30/2022/3:08:41 PM    Final    CT Abdomen Pelvis W Contrast  Result Date: 06/30/2022 CLINICAL DATA:  Nausea and emesis. Abdominal distension. Pallor. Abdominal pain. EXAM: CT ABDOMEN AND PELVIS WITH CONTRAST TECHNIQUE: Multidetector CT imaging of the abdomen and pelvis was performed using the standard protocol following bolus administration of intravenous contrast.  RADIATION DOSE REDUCTION: This exam was performed according to the  departmental dose-optimization program which includes automated exposure control, adjustment of the mA and/or kV according to patient size and/or use of iterative reconstruction technique. CONTRAST:  156m OMNIPAQUE IOHEXOL 350 MG/ML SOLN COMPARISON:  Multiple exams, including CT chest 10/16/2019 FINDINGS: Lower chest: Large pericardial effusion, not present on 10/16/2019. Pacer leads noted. Mild to moderate cardiomegaly. Aortic valve calcification. Thoracic aortic atherosclerotic calcification. Small to moderate hiatal hernia. Fluid distention of the distal esophagus. Small to moderate bilateral pleural effusions with passive atelectasis. Mild scarring or atelectasis in the right middle lobe and lingula. Hepatobiliary: Accentuated density in the gallbladder, probably from sludge. Mildly accentuated enhancement in the wall of the common hepatic duct and common bile duct. No intrahepatic biliary dilatation. Indistinct stranding in the porta hepatis. Pancreas: Hypodense pancreatic tail lesion, 2.4 by 1.6 cm on image 36 series 3, internal density 9 Hounsfield units. Fluid density lesion along the inferior margin of the pancreatic body, 3.0 by 1.6 cm on image 39 series 3, internal density 2 Hounsfield units. Questionable prominence of the dorsal and ventral pancreatic duct in the pancreatic head. Possible uncinate process cystic lesion, 1.6 by 0.8 cm on image 40 series 3. Spleen: Unremarkable Adrenals/Urinary Tract: Multiple bilateral moderate to large sized benign renal cysts. 1.8 by 1.4 cm focus of hypoenhancement in the renal parenchyma adjacent to the posterior renal cyst of the left kidney upper pole on image 14 series 8 and image 39 series 3. Urinary bladder unremarkable. The adrenal glands appear normal. Stomach/Bowel: Sigmoid colon diverticulosis, no compelling findings of active diverticulitis. Vascular/Lymphatic: Atherosclerosis is present, including aortoiliac atherosclerotic disease. There is atheromatous  plaque proximally in the celiac trunk and SMA without overt occlusion. Reproductive: Unremarkable Other: Low-level edema along the root of the mesentery. Infiltrative edema in the porta hepatis. Musculoskeletal: Prominent degenerative hip arthropathy bilaterally. Bony demineralization. Left sacral nerve stimulator device. Old healed right medial eleventh rib fracture. Dextroconvex lumbar scoliosis. Mild rectus diastasis and anterior abdominal wall laxity. Lumbar spondylosis and degenerative disc disease contribute to multilevel foraminal stenosis and central narrowing of the thecal sac IMPRESSION: 1. Very large pericardial effusion, not present on 10/16/2019. Correlate with physical exam findings such as Becks triad in assessing for signs of tamponade, echocardiography may be warranted in further workup. 2. Inflammatory stranding in the porta hepatis some accentuated enhancement along the non dilated extrahepatic biliary tree. Cannot exclude cholangitis. 3. Focal hypoenhancement in the upper medial right kidney renal parenchyma, potentially from pyelonephritis or infiltrative malignancy. Correlate with urine analysis in assessing for signs of pyelonephritis. 4. Multiple hypodense, probably cystic lesions of the pancreas. The largest measures 3.0 cm in long axis. In light of the size the lesions and the patient's age, follow up pancreatic protocol CT or MRI is recommended in 2 years time. The patient does have a pacer and a sacral nerve stimulator which may preclude MRI. This recommendation follows ACR consensus guidelines: Management of Incidental Pancreatic Cysts: A White Paper of the ACR Incidental Findings Committee. [id number];16:[id number] 5. Small to moderate bilateral pleural effusions with passive atelectasis. 6. Other imaging findings of potential clinical significance: Aortic valve calcification. Aortic Atherosclerosis (ICD10-I70.0). Small to moderate hiatal hernia. Multiple bilateral moderate to  large sized renal cysts. Sigmoid colon diverticulosis. Systemic atherosclerosis. Prominent degenerative hip arthropathy bilaterally. Dextroconvex lumbar scoliosis. Mild rectus diastasis. Bony demineralization. Multilevel lumbar impingement due to spondylosis and degenerative disc disease. Electronically Signed   By: WCindra EvesD.  On: 06/30/2022 13:07   DG Abd Acute W/Chest  Result Date: 06/30/2022 CLINICAL DATA:  Emesis EXAM: DG ABDOMEN ACUTE WITH 1 VIEW CHEST COMPARISON:  Radiographs 06/22/2022 FINDINGS: Mild gaseous distention of the cecum/right colon at the upper limits of normal. No dilation of the remaining colon or small. No radiopaque calculi or other significant radiographic abnormality is seen. Heart size and mediastinal contours are within normal limits. Sacral stimulator. Enlarged cardiomediastinal silhouette. Atelectasis/consolidation in the left lower lung. Scattered areas of linear atelectasis/scarring. Possible small left pleural effusion. Aortic atherosclerotic calcification. Advanced thoracolumbar spondylosis. IMPRESSION: 1. Negative abdominal radiographs. 2. Presumed atelectasis in the left lower lung though pneumonia is difficult to exclude. Electronically Signed   By: Placido Sou M.D.   On: 06/30/2022 11:25     Patient Profile     88 y.o. female with past medical history of persistent atrial fibrillation, chronic diastolic congestive heart failure, history of sick sinus syndrome status post pacemaker, hypothyroidism admitted with leukocytosis, chest pain and dyspnea and found to have pericardial effusion.  Echocardiogram July 27 showed normal LV function, large pericardial effusion without RV diastolic collapse; there was concern for early tamponade.  Assessment & Plan    1 pericardial effusion-I have personally reviewed the patient's echocardiograms from July 20, July 27 and today.  She has a large pericardial effusion which has increased in size significantly  compared to July 20.  Her IVC is dilated and I am concerned with early tamponade.  Would favor proceeding with pericardiocentesis and patient agreeable.  We will arrange.  Etiology of effusion is unclear.  She denies recent viral symptoms.  Sent fluid for cell count, LDH, Gram stain, cultures and cytology.  Note she has not had apixaban in the past 36 hours.  I will hold Lasix in the setting of potential tamponade and we can resume after procedure if necessary.  Add colchicine.  2 persistent atrial fibrillation-continue amiodarone.  Eliquis is on hold for pending pericardiocentesis.  3 question cholangitis on CT scan-no right upper quadrant tenderness on examination.  We will leave this issue and possible pyelonephritis to primary service.  For questions or updates, please contact [place] Please consult [email or web address] for contact info under        Signed, Kirk Ruths, MD  07/01/2022, 9:59 AM

## 2022-07-01 NOTE — Progress Notes (Signed)
  Echocardiogram 2D Echocardiogram has been performed.  Merrie Roof F 07/01/2022, 12:41 PM

## 2022-07-01 NOTE — Progress Notes (Addendum)
PROGRESS NOTE    Faith Parker  KXF:[id number] DOB: 06-18-1930 DOA: 06/30/2022 PCP: Faith Dad, MD     Faith Parker is a 88 y.o. female with medical history significant of chronic A-fib, HTN, chronic HFpEF, hypothyroidism, recurrent UTI on suppression antibiotics, presented to the hospital with  with intermittent chest pain and shortness of breath.  Patient was recently hospitalized for similar problem and was treated for heart failure with fluid overload with aggressive diuresis.  Patient however continued to have intermittent chest pain and shortness of breath with nausea and vomiting.  In the ED patient was noted to be in atrial fibrillation troponins were negative EKG showed chronic A-fib.  CT scan of the abdomen and pelvis showed large pericardial effusion compared to last time.  Creatinine was 1.3.  Cardiology was consulted and started on Lasix.  Patient was then admitted hospital for further evaluation and treatment.   Assessment and plan   Principal Problem:   Pericarditis Active Problems:   Hypothyroidism   PAF (paroxysmal atrial fibrillation) (HCC)   CKD (chronic kidney disease) stage 3, GFR 30-59 ml/min (HCC)   Hyperlipidemia   Acute on chronic diastolic CHF (congestive heart failure) (HCC)   Hypokalemia   Pericardial effusion likely secondary to pericarditis New pericardial effusion.  No history of pericarditis or recent URI symptoms.  Cardiology recommended diuresis with Lasix '40mg'$  twice daily.  Check echocardiogram.  Holding Eliquis with possibility of hemorrhagic effusion.  Hemodynamically stable at this time.  Might need pericardiocentesis.  Cardiology on board.  CRP elevated at 19.6.  Continue colchicine and prednisone p.o. 20 milligram daily.  No features of tamponade at this time.  Follow cardiology recommendation.  Hypokalemia.  Will replace.  Check levels in AM.  Acute on chronic HFpEF decompensation -With pleural effusion and pericardial effusion.  Continue  with IV diuresis.  BNP at 831.  Leukocytosis WBC at 18.0 today.  We will continue to monitor.  Could be reactive.  No signs of active infection though patient is on chronic suppression with antibiotic for UTI.     Chronic A-fib Continue atenolol and amiodarone.    Hypothyroidism Continue Synthroid.  TSH was 3.6.   Chronic normocytic anemia -We will continue to monitor CBC.  FOBT negative.      DVT prophylaxis: heparin injection 5,000 Units Start: 06/30/22 1700   Code Status:     Code Status: DNR  Disposition: Home  Status is: Inpatient  Remains inpatient appropriate because: Pericardial effusion, cardiology evaluation, pericarditis   Family Communication: Communicated with the patient's daughters at bedside.  Consultants:  Cardiology  Procedures:  None  Antimicrobials:  Keflex  Anti-infectives (From admission, onward)    Start     Dose/Rate Route Frequency Ordered Stop   06/30/22 1830  cephALEXin (KEFLEX) capsule 250 mg        250 mg Oral Daily 06/30/22 1524        Subjective: Today, patient was seen and examined at bedside.  Had some chest pain in the morning around 8/10 and received medication with improvement.  No shortness of breath no fever chills.  Objective: Vitals:   07/01/22 0030 07/01/22 0425 07/01/22 0426 07/01/22 0829  BP: (!) 163/101 (!) 156/93  (!) 158/97  Pulse: (!) 103 (!) 103  (!) 116  Resp: (!) 22 20  (!) 22  Temp: 98.1 F (36.7 C) 98.5 F (36.9 C) 98.5 F (36.9 C) 97.6 F (36.4 C)  TempSrc: Oral  Oral Oral  SpO2: 92% 94%  92%  Weight:   91.1 kg   Height:        Intake/Output Summary (Last 24 hours) at 07/01/2022 1007 Last data filed at 07/01/2022 0427 Gross per 24 hour  Intake 857.57 ml  Output 700 ml  Net 157.57 ml   Filed Weights   06/30/22 1040 06/30/22 1654 07/01/22 0426  Weight: 90.7 kg 93.8 kg 91.1 kg    Physical Examination: Body mass index is 28.82 kg/m.   General:  Average built,, elderly female not in  obvious distress HENT:   No scleral pallor or icterus noted. Oral mucosa is moist.  Chest:   Diminished breath sounds bilaterally.  CVS: S1 &S2 heard. No murmur.  Regular rate and rhythm. Abdomen: Soft, nontender, nondistended.  Bowel sounds are heard.   Extremities: No cyanosis, clubbing or edema.  Peripheral pulses are palpable. Psych: Alert, awake and communicative, normal mood CNS:  No cranial nerve deficits.  Power equal in all extremities.   Skin: Warm and dry.  No rashes noted.  Data Reviewed:   CBC: Recent Labs  Lab 06/30/22 1049 07/01/22 0212  WBC 18.1* 18.0*  NEUTROABS 16.0*  --   HGB 10.2* 10.0*  HCT 31.3* 30.9*  MCV 82.4 80.9  PLT 383 426*    Basic Metabolic Panel: Recent Labs  Lab 06/25/22 0201 06/30/22 1049 07/01/22 0212  NA 135 132* 132*  K 4.0 3.6 3.3*  CL 101 99 98  CO2 '27 23 24  '$ GLUCOSE 97 165* 155*  BUN '15 16 16  '$ CREATININE 1.12* 1.39* 1.37*  CALCIUM 8.0* 8.6* 8.7*    Liver Function Tests: Recent Labs  Lab 06/30/22 1049 07/01/22 0212  AST 48* 38  ALT 50* 48*  ALKPHOS 88 88  BILITOT 0.8 0.6  PROT 6.1* 6.3*  ALBUMIN 3.0* 3.1*     Radiology Studies: ECHOCARDIOGRAM LIMITED  Result Date: 06/30/2022    ECHOCARDIOGRAM LIMITED REPORT   Patient Name:   Faith Parker Date of Exam: 06/30/2022 Medical Rec #:  [id number]      Height:       70.0 in Accession #:    [id number]     Weight:       200.0 lb Date of Birth:  1929/12/23       BSA:          2.087 m Patient Age:    56 years       BP:           114/81 mmHg Patient Gender: F              HR:           101 bpm. Exam Location:  Inpatient Procedure: Limited Echo                       STAT ECHO Reported to: Dr Loralie Champagne on 06/30/2022 12:00:00 AM          Dr. Lake Bells O'Neal bedside for echo. Indications:    Pericardial effusion  History:        Patient has prior history of Echocardiogram examinations, most                 recent 06/23/2022.  Sonographer:    Faith Parker RDCS (AE) Referring Phys: [phone] Faith  Parker IMPRESSIONS  1. Large pericardial effusion. The pericardial effusion is circumferential. The IVC is dilated though the RV does not appear to show diastolic collapse. No respirometry. I am concerned for early tamponade.  2. Left ventricular ejection fraction, by estimation, is 60 to 65%. The left ventricle has normal function. The left ventricle has no regional wall motion abnormalities. Left ventricular diastolic parameters are indeterminate.  3. Right ventricular systolic function is normal. The right ventricular size is normal.  4. The aortic valve is tricuspid. There is severe calcifcation of the aortic valve. not fully evaluated.  5. The inferior vena cava is dilated in size with <50% respiratory variability, suggesting right atrial pressure of 15 mmHg.  6. The patient was in atrial fibrillation. FINDINGS  Left Ventricle: Left ventricular ejection fraction, by estimation, is 60 to 65%. The left ventricle has normal function. The left ventricle has no regional wall motion abnormalities. Left ventricular diastolic parameters are indeterminate. Right Ventricle: The right ventricular size is normal. No increase in right ventricular wall thickness. Right ventricular systolic function is normal. Pericardium: A large pericardial effusion is present. The pericardial effusion is circumferential. Aortic Valve: The aortic valve is tricuspid. There is severe calcifcation of the aortic valve. Not fully evaluated. Venous: The inferior vena cava is dilated in size with less than 50% respiratory variability, suggesting right atrial pressure of 15 mmHg. IVC IVC diam: 3.00 cm Dalton McleanMD Electronically signed by Franki Monte Signature Date/Time: 06/30/2022/3:08:41 PM    Final    CT Abdomen Pelvis W Contrast  Result Date: 06/30/2022 CLINICAL DATA:  Nausea and emesis. Abdominal distension. Pallor. Abdominal pain. EXAM: CT ABDOMEN AND PELVIS WITH CONTRAST TECHNIQUE: Multidetector CT imaging of the abdomen and pelvis  was performed using the standard protocol following bolus administration of intravenous contrast. RADIATION DOSE REDUCTION: This exam was performed according to the departmental dose-optimization program which includes automated exposure control, adjustment of the mA and/or kV according to patient size and/or use of iterative reconstruction technique. CONTRAST:  168m OMNIPAQUE IOHEXOL 350 MG/ML SOLN COMPARISON:  Multiple exams, including CT chest 10/16/2019 FINDINGS: Lower chest: Large pericardial effusion, not present on 10/16/2019. Pacer leads noted. Mild to moderate cardiomegaly. Aortic valve calcification. Thoracic aortic atherosclerotic calcification. Small to moderate hiatal hernia. Fluid distention of the distal esophagus. Small to moderate bilateral pleural effusions with passive atelectasis. Mild scarring or atelectasis in the right middle lobe and lingula. Hepatobiliary: Accentuated density in the gallbladder, probably from sludge. Mildly accentuated enhancement in the wall of the common hepatic duct and common bile duct. No intrahepatic biliary dilatation. Indistinct stranding in the porta hepatis. Pancreas: Hypodense pancreatic tail lesion, 2.4 by 1.6 cm on image 36 series 3, internal density 9 Hounsfield units. Fluid density lesion along the inferior margin of the pancreatic body, 3.0 by 1.6 cm on image 39 series 3, internal density 2 Hounsfield units. Questionable prominence of the dorsal and ventral pancreatic duct in the pancreatic head. Possible uncinate process cystic lesion, 1.6 by 0.8 cm on image 40 series 3. Spleen: Unremarkable Adrenals/Urinary Tract: Multiple bilateral moderate to large sized benign renal cysts. 1.8 by 1.4 cm focus of hypoenhancement in the renal parenchyma adjacent to the posterior renal cyst of the left kidney upper pole on image 14 series 8 and image 39 series 3. Urinary bladder unremarkable. The adrenal glands appear normal. Stomach/Bowel: Sigmoid colon diverticulosis, no  compelling findings of active diverticulitis. Vascular/Lymphatic: Atherosclerosis is present, including aortoiliac atherosclerotic disease. There is atheromatous plaque proximally in the celiac trunk and SMA without overt occlusion. Reproductive: Unremarkable Other: Low-level edema along the root of the mesentery. Infiltrative edema in the porta hepatis. Musculoskeletal: Prominent degenerative hip arthropathy bilaterally. Bony demineralization. Left sacral nerve stimulator device.  Old healed right medial eleventh rib fracture. Dextroconvex lumbar scoliosis. Mild rectus diastasis and anterior abdominal wall laxity. Lumbar spondylosis and degenerative disc disease contribute to multilevel foraminal stenosis and central narrowing of the thecal sac IMPRESSION: 1. Very large pericardial effusion, not present on 10/16/2019. Correlate with physical exam findings such as Becks triad in assessing for signs of tamponade, echocardiography may be warranted in further workup. 2. Inflammatory stranding in the porta hepatis some accentuated enhancement along the non dilated extrahepatic biliary tree. Cannot exclude cholangitis. 3. Focal hypoenhancement in the upper medial right kidney renal parenchyma, potentially from pyelonephritis or infiltrative malignancy. Correlate with urine analysis in assessing for signs of pyelonephritis. 4. Multiple hypodense, probably cystic lesions of the pancreas. The largest measures 3.0 cm in long axis. In light of the size the lesions and the patient's age, follow up pancreatic protocol CT or MRI is recommended in 2 years time. The patient does have a pacer and a sacral nerve stimulator which may preclude MRI. This recommendation follows ACR consensus guidelines: Management of Incidental Pancreatic Cysts: A White Paper of the ACR Incidental Findings Committee. [place] [id number]. 5. Small to moderate bilateral pleural effusions with passive atelectasis. 6. Other imaging findings of  potential clinical significance: Aortic valve calcification. Aortic Atherosclerosis (ICD10-I70.0). Small to moderate hiatal hernia. Multiple bilateral moderate to large sized renal cysts. Sigmoid colon diverticulosis. Systemic atherosclerosis. Prominent degenerative hip arthropathy bilaterally. Dextroconvex lumbar scoliosis. Mild rectus diastasis. Bony demineralization. Multilevel lumbar impingement due to spondylosis and degenerative disc disease. Electronically Signed   By: Van Clines M.D.   On: 06/30/2022 13:07   DG Abd Acute W/Chest  Result Date: 06/30/2022 CLINICAL DATA:  Emesis EXAM: DG ABDOMEN ACUTE WITH 1 VIEW CHEST COMPARISON:  Radiographs 06/22/2022 FINDINGS: Mild gaseous distention of the cecum/right colon at the upper limits of normal. No dilation of the remaining colon or small. No radiopaque calculi or other significant radiographic abnormality is seen. Heart size and mediastinal contours are within normal limits. Sacral stimulator. Enlarged cardiomediastinal silhouette. Atelectasis/consolidation in the left lower lung. Scattered areas of linear atelectasis/scarring. Possible small left pleural effusion. Aortic atherosclerotic calcification. Advanced thoracolumbar spondylosis. IMPRESSION: 1. Negative abdominal radiographs. 2. Presumed atelectasis in the left lower lung though pneumonia is difficult to exclude. Electronically Signed   By: Placido Sou M.D.   On: 06/30/2022 11:25      LOS: 1 day    Flora Lipps, MD [facility] Available via Epic secure chat 7am-7pm After these hours, please refer to coverage provider listed on [email or web address] 07/01/2022, 10:07 AM

## 2022-07-02 ENCOUNTER — Inpatient Hospital Stay (HOSPITAL_COMMUNITY): Payer: Medicare Other

## 2022-07-02 DIAGNOSIS — I3139 Other pericardial effusion (noninflammatory): Secondary | ICD-10-CM

## 2022-07-02 DIAGNOSIS — I314 Cardiac tamponade: Secondary | ICD-10-CM | POA: Diagnosis not present

## 2022-07-02 DIAGNOSIS — I480 Paroxysmal atrial fibrillation: Secondary | ICD-10-CM | POA: Diagnosis not present

## 2022-07-02 DIAGNOSIS — E785 Hyperlipidemia, unspecified: Secondary | ICD-10-CM | POA: Diagnosis not present

## 2022-07-02 DIAGNOSIS — I5033 Acute on chronic diastolic (congestive) heart failure: Secondary | ICD-10-CM

## 2022-07-02 DIAGNOSIS — N1831 Chronic kidney disease, stage 3a: Secondary | ICD-10-CM | POA: Diagnosis not present

## 2022-07-02 LAB — CBC
HCT: 29 % — ABNORMAL LOW (ref 36.0–46.0)
Hemoglobin: 9.4 g/dL — ABNORMAL LOW (ref 12.0–15.0)
MCH: 26.3 pg (ref 26.0–34.0)
MCHC: 32.4 g/dL (ref 30.0–36.0)
MCV: 81 fL (ref 80.0–100.0)
Platelets: 406 10*3/uL — ABNORMAL HIGH (ref 150–400)
RBC: 3.58 MIL/uL — ABNORMAL LOW (ref 3.87–5.11)
RDW: 16 % — ABNORMAL HIGH (ref 11.5–15.5)
WBC: 13 10*3/uL — ABNORMAL HIGH (ref 4.0–10.5)
nRBC: 0 % (ref 0.0–0.2)

## 2022-07-02 LAB — ECHOCARDIOGRAM LIMITED
Height: 68 in
S' Lateral: 2.5 cm
Weight: 3294.55 oz

## 2022-07-02 LAB — BASIC METABOLIC PANEL
Anion gap: 8 (ref 5–15)
BUN: 19 mg/dL (ref 8–23)
CO2: 28 mmol/L (ref 22–32)
Calcium: 8.4 mg/dL — ABNORMAL LOW (ref 8.9–10.3)
Chloride: 98 mmol/L (ref 98–111)
Creatinine, Ser: 1.31 mg/dL — ABNORMAL HIGH (ref 0.44–1.00)
GFR, Estimated: 39 mL/min — ABNORMAL LOW (ref >60)
Glucose, Bld: 114 mg/dL — ABNORMAL HIGH (ref 70–99)
Potassium: 3.7 mmol/L (ref 3.5–5.1)
Sodium: 134 mmol/L — ABNORMAL LOW (ref 135–145)

## 2022-07-02 LAB — GLUCOSE, BODY FLUID OTHER: Glucose, Body Fluid Other: 100 mg/dL

## 2022-07-02 LAB — PROTEIN, BODY FLUID (OTHER): Total Protein, Body Fluid Other: 4.3 g/dL

## 2022-07-02 LAB — LD, BODY FLUID (OTHER): LD, Body Fluid: 812 IU/L

## 2022-07-02 LAB — MAGNESIUM: Magnesium: 1.9 mg/dL (ref 1.7–2.4)

## 2022-07-02 NOTE — Progress Notes (Signed)
PROGRESS NOTE    Faith Parker  RPR:[id number] DOB: 05/31/1930 DOA: 06/30/2022 PCP: [name], MD   Faith Parker is a 88 y.o. female with medical history significant of chronic A-fib, HTN, chronic HFpEF, hypothyroidism, recurrent UTI on suppression antibiotics, presented to the hospital with  with intermittent chest pain and shortness of breath.  Patient was recently hospitalized for similar problem and was treated for heart failure with fluid overload with aggressive diuresis.  Patient however continued to have intermittent chest pain and shortness of breath with nausea and vomiting.  In the ED, patient was noted to be in atrial fibrillation troponins were negative. EKG showed chronic A-fib.  CT scan of the abdomen and pelvis showed large pericardial effusion compared to last time.  Creatinine was 1.3.  Cardiology was consulted and started on Lasix.  Patient was then admitted hospital for further evaluation and treatment.  During hospitalization, patient underwent pericardiocentesis on 07/01/2022.  Cardiology following.   Assessment and plan   Principal Problem:   Pericarditis Active Problems:   Hypothyroidism   PAF (paroxysmal atrial fibrillation) (HCC)   CKD (chronic kidney disease) stage 3, GFR 30-59 ml/min (HCC)   Hyperlipidemia   Acute on chronic diastolic CHF (congestive heart failure) (HCC)   Hypokalemia   Pericardial effusion likely secondary to pericarditis New large pericardial effusion.  No history of pericarditis or recent URI symptoms.  Diuretics currently on hold.  2 D echo with large effusion so patient underwent pericardiocentesis on 07/01/2022 with drainage of 600 mL of bloody pericardial fluid..  Holding Eliquis. Hemodynamically stable at this time. CRP elevated at 19.6.  Continue colchicine and prednisone p.o. 20 milligram daily.  No features of tamponade at this time.  Follow cardiology recommendation.  Cardiology planning for repeat echocardiogram  Hypokalemia.   Will replace.  Check levels in AM.  Latest potassium was 3.7.  Magnesium of 1.9.  Acute on chronic HFpEF decompensation -With pleural effusion and pericardial effusion. BNP at 831.  Leukocytosis WBC at 13.0 from 18.0 today.  We will continue to monitor.  Could be reactive.  No signs of active infection though patient is on chronic suppression with antibiotic for UTI.  Pericardial fluid culture negative in less than 24 hours   Chronic A-fib Continue atenolol and amiodarone.  Rate controlled at this time.   Hypothyroidism Continue Synthroid.  TSH was 3.6.   Chronic normocytic anemia -We will continue to monitor CBC.  FOBT negative.      DVT prophylaxis:   Anticoagulation currently on hold   Code Status:     Code Status: DNR  Disposition: Home  Status is: Inpatient  Remains inpatient appropriate because: Pericardial effusion status post pericardiocentesis   family Communication: I again communicated with the patient's daughters at bedside.  Consultants:  Cardiology  Procedures:   pericardiocentesis on 07/01/2022  Antimicrobials:  Keflex  Anti-infectives (From admission, onward)    Start     Dose/Rate Route Frequency Ordered Stop   06/30/22 1830  cephALEXin (KEFLEX) capsule 250 mg        250 mg Oral Daily 06/30/22 1524        Subjective: Today, patient was seen and examined at bedside.  Patient denies any chest pain, dyspnea.  Patient's daughters at bedside. Objective: Vitals:   07/02/22 0534 07/02/22 0800 07/02/22 1200 07/02/22 1318  BP: 125/85 137/83 (!) 132/92 98/67  Pulse: 68 86 80 83  Resp: 18 18 17 19   Temp: 97.6 F (36.4 C) 98 F (36.7 C)  98 F (36.7 C)   TempSrc: Oral Oral    SpO2: 93% 93% 93% 93%  Weight: 93.4 kg     Height:        Intake/Output Summary (Last 24 hours) at 07/02/2022 1330 Last data filed at 07/01/2022 2000 Gross per 24 hour  Intake 480 ml  Output 300 ml  Net 180 ml    Filed Weights   06/30/22 1654 07/01/22 0426 07/02/22  0534  Weight: 93.8 kg 91.1 kg 93.4 kg    Physical Examination: Body mass index is 31.31 kg/m.   General: Obese built, not in obvious distress HENT:   No scleral pallor or icterus noted. Oral mucosa is moist.  Chest:    Diminished breath sounds bilaterally. No crackles or wheezes.  CVS: S1 &S2 heard. No murmur.  Irregular rhythm.  Systolic murmur noted. Abdomen: Soft, nontender, nondistended.  Bowel sounds are heard.   Extremities: No cyanosis, clubbing or edema.  Peripheral pulses are palpable. Psych: Alert, awake and oriented, normal mood CNS:  No cranial nerve deficits.  Power equal in all extremities.   Skin: Warm and dry.  No rashes noted.  Data Reviewed:   CBC: Recent Labs  Lab 06/30/22 1049 07/01/22 0212 07/02/22 0105  WBC 18.1* 18.0* 13.0*  NEUTROABS 16.0*  --   --   HGB 10.2* 10.0* 9.4*  HCT 31.3* 30.9* 29.0*  MCV 82.4 80.9 81.0  PLT 383 426* 406*     Basic Metabolic Panel: Recent Labs  Lab 06/30/22 1049 07/01/22 0212 07/02/22 0105  NA 132* 132* 134*  K 3.6 3.3* 3.7  CL 99 98 98  CO2 23 24 28   GLUCOSE 165* 155* 114*  BUN 16 16 19   CREATININE 1.39* 1.37* 1.31*  CALCIUM 8.6* 8.7* 8.4*  MG  --   --  1.9     Liver Function Tests: Recent Labs  Lab 06/30/22 1049 07/01/22 0212  AST 48* 38  ALT 50* 48*  ALKPHOS 88 88  BILITOT 0.8 0.6  PROT 6.1* 6.3*  ALBUMIN 3.0* 3.1*      Radiology Studies: DG ESOPHAGUS W SINGLE CM (SOL OR THIN BA)  Result Date: 07/01/2022 CLINICAL DATA:  Dysphagia, abdominal pain, chest pain, vomiting. EXAM: ESOPHAGUS/BARIUM SWALLOW/TABLET STUDY TECHNIQUE: A single contrast examination was performed using thin liquid barium. Additionally, the patient swallowed a 13 mm barium tablet under fluoroscopy. The exam was performed by Candiss Norse, PA-C, and was supervised and interpreted by Kellie Simmering, D.O. FLUOROSCOPY: Fluoroscopy time: 3 minutes, 30 seconds (22.30 mGy). COMPARISON:  CT abdomen/pelvis 06/30/2022. FINDINGS:  Patulous esophagus. No evidence of fixed esophageal stricture, esophageal mass or mucosal abnormality. Moderate-to-severe intermittent esophageal dysmotility with tertiary contractions. Moderate-volume gastroesophageal reflux observed to the level of the mid esophagus. Small to moderate-sized hiatal hernia. The patient swallowed a 13 mm barium tablet, which passed freely into the stomach. IMPRESSION: 1. No evidence of esophageal stricture. 2. Patulous esophagus. 3. Moderate-to-severe intermittent esophageal dysmotility with tertiary contractions. 4. Moderate-volume gastroesophageal reflux observed to the level of the mid esophagus. 5. Small to moderate-sized hiatal hernia. Electronically Signed   By: Kellie Simmering D.O.   On: 07/01/2022 16:06   ECHOCARDIOGRAM LIMITED  Result Date: 07/01/2022    ECHOCARDIOGRAM LIMITED REPORT   Patient Name:   ANALISA SLEDD Date of Exam: 07/01/2022 Medical Rec #:  [id number]      Height:       68.0 in Accession #:    [id number]     Weight:  207.6 lb Date of Birth:  [birth year]       BSA:          2.076 m Patient Age:    5 years       BP:           134/106 mmHg Patient Gender: F              HR:           96 bpm. Exam Location:  Inpatient Procedure: Limited Echo and Saline Contrast Bubble Study Indications:    Pericardial effusion  History:        Patient has prior history of Echocardiogram examinations, most                 recent 06/30/2022.  Sonographer:    Merrie Roof RDCS Referring Phys: [phone] [facility]IDA  Sonographer Comments: 600 ml of blood tinged fluid was drawn out with no complications. IMPRESSIONS  1. Left ventricular ejection fraction, by estimation, is 60 to 65%. The left ventricle has normal function.  2. Difficult to visualize bubble study, no significant interatrial shunt. Conclusion(s)/Recommendation(s): Pre pericardiocentesis shows large pericardial effusion. Post shows resolution of pericardial effusion. FINDINGS  Left Ventricle: Left ventricular ejection  fraction, by estimation, is 60 to 65%. The left ventricle has normal function. Pericardium: The pericardial effusion is circumferential. IAS/Shunts: Agitated saline contrast was given intravenously to evaluate for intracardiac shunting. Phineas Inches Electronically signed by Phineas Inches Signature Date/Time: 07/01/2022/12:51:50 PM    Final    CARDIAC CATHETERIZATION  Result Date: 07/01/2022 Successful pericardiocentesis via the subxiphoid area.  I removed 600 mL of bloody fluid.  On echocardiogram, there was resolution of pericardial effusion.  I elected not to keep the pigtail catheter in given that I could not advance the the regular catheter provided with the pericardiocentesis kit due to what seems to be significantly fibrosed pericardium.  I ended up using a short 5 French pigtail catheter which I felt was not a good option to leave in place. Recommendations: Given that the effusion is bloody, differential diagnosis include malignancy or pericarditis with hemorrhagic transformation given that she is on anticoagulation.  I recommend holding Eliquis for at least few weeks. The fluid was sent to the lab for analysis.   ECHOCARDIOGRAM LIMITED  Result Date: 07/01/2022    ECHOCARDIOGRAM LIMITED REPORT   Patient Name:   MAKALYA NAVE Date of Exam: 07/01/2022 Medical Rec #:  [id number]      Height:       70.0 in Accession #:    [id number]     Weight:       200.8 lb Date of Birth:  [birth year]       BSA:          2.091 m Patient Age:    10 years       BP:           158/97 mmHg Patient Gender: F              HR:           111 bpm. Exam Location:  Inpatient Procedure: Limited Echo, Limited Color Doppler and Cardiac Doppler Indications:    pericardial effusion  History:        Patient has prior history of Echocardiogram examinations, most                 recent 06/30/2022. Pericarditis, chronic kidney disease,  Arrythmias:Atrial Fibrillation; Risk Factors:Hypertension and                 Dyslipidemia.   Sonographer:    Johny Chess RDCS Referring Phys: Cassie Freer O'NEAL IMPRESSIONS  1. Left ventricular ejection fraction, by estimation, is 60 to 65%. The left ventricle has normal function.  2. Large pericardial effusion. The pericardial effusion is circumferential. Moderate pleural effusion.  3. The inferior vena cava is dilated in size with <50% respiratory variability, suggesting right atrial pressure of 15 mmHg. Conclusion(s)/Recommendation(s): Mild features of cardiac tamponade, recommend clinical correlation. No significant changes from prior echo. FINDINGS  Left Ventricle: Left ventricular ejection fraction, by estimation, is 60 to 65%. The left ventricle has normal function. Pericardium: A large pericardial effusion is present. The pericardial effusion is circumferential. Venous: The inferior vena cava is dilated in size with less than 50% respiratory variability, suggesting right atrial pressure of 15 mmHg. Additional Comments: There is a moderate pleural effusion. IVC IVC diam: 2.70 cm Phineas Inches Electronically signed by Phineas Inches Signature Date/Time: 07/01/2022/10:21:29 AM    Final    ECHOCARDIOGRAM LIMITED  Result Date: 06/30/2022    ECHOCARDIOGRAM LIMITED REPORT   Patient Name:   JANNELL FRANTA Date of Exam: 06/30/2022 Medical Rec #:  [id number]      Height:       70.0 in Accession #:    [id number]     Weight:       200.0 lb Date of Birth:  14-Dec-1929       BSA:          2.087 m Patient Age:    12 years       BP:           114/81 mmHg Patient Gender: F              HR:           101 bpm. Exam Location:  Inpatient Procedure: Limited Echo                       STAT ECHO Reported to: Dr Loralie Champagne on 06/30/2022 12:00:00 AM          Dr. Lake Bells O'Neal bedside for echo. Indications:    Pericardial effusion  History:        Patient has prior history of Echocardiogram examinations, most                 recent 06/23/2022.  Sonographer:    Clayton Lefort RDCS (AE) Referring Phys: [phone] HAO MENG  IMPRESSIONS  1. Large pericardial effusion. The pericardial effusion is circumferential. The IVC is dilated though the RV does not appear to show diastolic collapse. No respirometry. I am concerned for early tamponade.  2. Left ventricular ejection fraction, by estimation, is 60 to 65%. The left ventricle has normal function. The left ventricle has no regional wall motion abnormalities. Left ventricular diastolic parameters are indeterminate.  3. Right ventricular systolic function is normal. The right ventricular size is normal.  4. The aortic valve is tricuspid. There is severe calcifcation of the aortic valve. not fully evaluated.  5. The inferior vena cava is dilated in size with <50% respiratory variability, suggesting right atrial pressure of 15 mmHg.  6. The patient was in atrial fibrillation. FINDINGS  Left Ventricle: Left ventricular ejection fraction, by estimation, is 60 to 65%. The left ventricle has normal function. The left ventricle has no regional wall motion abnormalities. Left ventricular diastolic  parameters are indeterminate. Right Ventricle: The right ventricular size is normal. No increase in right ventricular wall thickness. Right ventricular systolic function is normal. Pericardium: A large pericardial effusion is present. The pericardial effusion is circumferential. Aortic Valve: The aortic valve is tricuspid. There is severe calcifcation of the aortic valve. Not fully evaluated. Venous: The inferior vena cava is dilated in size with less than 50% respiratory variability, suggesting right atrial pressure of 15 mmHg. IVC IVC diam: 3.00 cm Dalton McleanMD Electronically signed by Franki Monte Signature Date/Time: 06/30/2022/3:08:41 PM    Final       LOS: 2 days    Flora Lipps, MD [facility] Available via Epic secure chat 7am-7pm After these hours, please refer to coverage provider listed on [email or web address] 07/02/2022, 1:30 PM

## 2022-07-02 NOTE — Evaluation (Signed)
Physical Therapy Evaluation Patient Details Name: Faith Parker MRN: [id number] DOB: 09-Dec-1929 Today's Date: 07/02/2022  History of Present Illness  Pt is a 88 y/o female admitted secondary to increased chest pain. Pt found to have pericarditis, underwent pericardiocentesis on 7/28. PMH includes a fib, HTN, and CKD.  Clinical Impression  Pt presents to PT with deficits in activity tolerance, strength, power, gait, balance, endurance. Pt with generalized weakness, fatiguing relatively quickly during session, but also reporting fatigue prior to initiating mobility due to multiple visitors and limited time for rest. Pt requires some physical assistance for bed mobility and benefits from assistance for symptom management and safety when mobilizing. Pt will benefit from aggressive mobilization in an effort to improve activity tolerance and to restore her prior level of function. PT recommends return to ALF with assistance from staff/family along with HHPT.       Recommendations for follow up therapy are one component of a multi-disciplinary discharge planning process, led by the attending physician.  Recommendations may be updated based on patient status, additional functional criteria and insurance authorization.  Follow Up Recommendations Home health PT (PT follow-up at ALF)      Assistance Recommended at Discharge Intermittent Supervision/Assistance  Patient can return home with the following  A little help with walking and/or transfers;A little help with bathing/dressing/bathroom;Assist for transportation    Equipment Recommendations None recommended by PT  Recommendations for Other Services       Functional Status Assessment Patient has had a recent decline in their functional status and demonstrates the ability to make significant improvements in function in a reasonable and predictable amount of time.     Precautions / Restrictions Precautions Precautions: Fall Restrictions Weight  Bearing Restrictions: No      Mobility  Bed Mobility Overal bed mobility: Needs Assistance Bed Mobility: Supine to Sit, Sit to Supine     Supine to sit: Min assist, HOB elevated Sit to supine: Min guard        Transfers Overall transfer level: Needs assistance Equipment used: Rolling walker (2 wheels) Transfers: Sit to/from Stand Sit to Stand: Min guard           General transfer comment: verbal cues for increased trunk flexion and hand placement    Ambulation/Gait Ambulation/Gait assistance: Min guard Gait Distance (Feet): 80 Feet Assistive device: Rolling walker (2 wheels) Gait Pattern/deviations: Step-through pattern Gait velocity: reduced Gait velocity interpretation: <1.8 ft/sec, indicate of risk for recurrent falls   General Gait Details: pt with slowed step-through gait, one instance of pt kicking rollator with left foot resulting in mild LOB which pt corrects with stepping strategy  Stairs            Wheelchair Mobility    Modified Rankin (Stroke Patients Only)       Balance Overall balance assessment: Needs assistance Sitting-balance support: No upper extremity supported, Feet supported Sitting balance-Leahy Scale: Good     Standing balance support: Single extremity supported, Bilateral upper extremity supported, Reliant on assistive device for balance Standing balance-Leahy Scale: Poor                               Pertinent Vitals/Pain Pain Assessment Pain Assessment: No/denies pain    Home Living Family/patient expects to be discharged to:: Assisted living Living Arrangements: Alone               Home Equipment: Rollator (4 wheels) Additional Comments: From [place]  [place]    Prior Function Prior Level of Function : Needs assist             Mobility Comments: pt furniture surfs within apartment, utilizes a rollator outside of her apartment ADLs Comments: Staff assists with ADLs.     Hand  Dominance   Dominant Hand: Right    Extremity/Trunk Assessment   Upper Extremity Assessment Upper Extremity Assessment: Generalized weakness    Lower Extremity Assessment Lower Extremity Assessment: Generalized weakness    Cervical / Trunk Assessment Cervical / Trunk Assessment: Kyphotic  Communication   Communication: No difficulties  Cognition Arousal/Alertness: Awake/alert Behavior During Therapy: WFL for tasks assessed/performed Overall Cognitive Status: History of cognitive impairments - at baseline                                 General Comments: slowed processing        General Comments General comments (skin integrity, edema, etc.): pt reports 8/10 DOE with ambulation, sats stable in 90s. tachy into 120s with mobility    Exercises     Assessment/Plan    PT Assessment Patient needs continued PT services  PT Problem List Decreased strength;Decreased activity tolerance;Decreased balance;Decreased mobility;Cardiopulmonary status limiting activity       PT Treatment Interventions DME instruction;Gait training;Functional mobility training;Therapeutic activities;Therapeutic exercise;Balance training;Neuromuscular re-education;Patient/family education    PT Goals (Current goals can be found in the Care Plan section)  Acute Rehab PT Goals Patient Stated Goal: to improve endurance and activity tolerance PT Goal Formulation: With patient Time For Goal Achievement: 07/16/22 Potential to Achieve Goals: Good    Frequency Min 3X/week     Co-evaluation               AM-PAC PT "6 Clicks" Mobility  Outcome Measure Help needed turning from your back to your side while in a flat bed without using bedrails?: A Little Help needed moving from lying on your back to sitting on the side of a flat bed without using bedrails?: A Little Help needed moving to and from a bed to a chair (including a wheelchair)?: A Little Help needed standing up from a chair  using your arms (e.g., wheelchair or bedside chair)?: A Little Help needed to walk in hospital room?: A Little Help needed climbing 3-5 steps with a railing? : A Lot 6 Click Score: 17    End of Session   Activity Tolerance: Patient limited by fatigue Patient left: in bed;with call bell/phone within reach;with bed alarm set Nurse Communication: Mobility status PT Visit Diagnosis: Other abnormalities of gait and mobility (R26.89);Muscle weakness (generalized) (M62.81)    Time: 2025-4270 PT Time Calculation (min) (ACUTE ONLY): 17 min   Charges:   PT Evaluation $PT Eval Low Complexity: [place], PT, DPT Acute Rehabilitation Office [phone]   Zenaida Niece 07/02/2022, 3:42 PM

## 2022-07-02 NOTE — Progress Notes (Signed)
Speech Language Pathology Treatment: Dysphagia  Patient Details Name: DEMMI SINDT MRN: [id number] DOB: 1930-03-14 Today's Date: 07/02/2022 Time: 1151-1200 SLP Time Calculation (min) (ACUTE ONLY): 9 min  Assessment / Plan / Recommendation Clinical Impression  Patient seen by SLP for skilled treatment focused on dysphagia. Several family members (daughters) present during session. SLP provided education (verbal and handouts) regarding patient's new findings but likely chronic GERD/esophageal dysphagia. Patient and family verbalized agreement and plan to try GERD precautions upon discharge home. SLP also recommended that outpatient GI could be consulted for management of GERD. SLP to s/o at this time as education complete.     HPI HPI: DIONE PETRON is a 88 y.o. female who presented with chest pain, acute on chronic CHF.  PMHx afib; HTN; hypothyroidism;  pacemaker placement. Esophagram completed on 07/01/22 showedNo evidence of esophageal stricture. Patulous esophagus. Moderate-to-severe intermittent esophageal dysmotility with tertiary contractions. Moderate-volume gastroesophageal reflux observed to the level of the mid esophagus. Small to moderate-sized hiatal hernia.      SLP Plan  All goals met;Discharge SLP treatment due to (comment)      Recommendations for follow up therapy are one component of a multi-disciplinary discharge planning process, led by the attending physician.  Recommendations may be updated based on patient status, additional functional criteria and insurance authorization.    Recommendations  Diet recommendations: Regular;Thin liquid Liquids provided via: Cup;Straw Medication Administration: Whole meds with liquid Supervision: Patient able to self feed Compensations: Slow rate;Small sips/bites;Follow solids with liquid Postural Changes and/or Swallow Maneuvers: Upright 30-60 min after meal;Seated upright 90 degrees                General recommendations:  Other(comment) (patient/family can consider seeking OP GI if needed for symptom management) Oral Care Recommendations: Oral care BID Follow Up Recommendations: No SLP follow up Assistance recommended at discharge: None SLP Visit Diagnosis: Dysphagia, unspecified (R13.10) Plan: All goals met;Discharge SLP treatment due to (comment)           Sonia Baller, MA, CCC-SLP Speech Therapy

## 2022-07-02 NOTE — Progress Notes (Signed)
  Echocardiogram 2D Echocardiogram has been performed.  Faith Parker 07/02/2022, 4:04 PM

## 2022-07-02 NOTE — Progress Notes (Signed)
CSW acknowledges consult for SNF/HH. The patient will require PT/OT evaluations. TOC will assist with disposition planning once the evaluations have been completed.  °  °TOC will continue to follow.    °

## 2022-07-02 NOTE — Progress Notes (Signed)
Progress Note  Patient Name: Faith Parker Date of Encounter: 07/02/2022  [facility] Cardiologist: Dorris Carnes, MD   Subjective   Remarkable symptomatic improvement with complete resolution of dyspnea and chest pain following pericardiocentesis S/P 600 mL pericardial (bloody fluid) drainage. Catheter removed. Hemodynamically stable, good UO.  Inpatient Medications    Scheduled Meds:  amiodarone  200 mg Oral BID   atenolol  25 mg Oral Daily   cephALEXin  250 mg Oral Daily   cholecalciferol  5,000 Units Oral Daily   colchicine  0.6 mg Oral Q48H   FLUoxetine  20 mg Oral Daily   levothyroxine  88 mcg Oral Q0600   omega-3 acid ethyl esters  2,000 mg Oral Daily   pantoprazole  40 mg Oral Daily   predniSONE  20 mg Oral Q breakfast   rosuvastatin  20 mg Oral QPM   sodium chloride flush  3 mL Intravenous Q12H   traMADol  50 mg Oral Daily   Continuous Infusions:  sodium chloride     PRN Meds: sodium chloride, acetaminophen, alum & mag hydroxide-simeth, metoprolol tartrate, ondansetron (ZOFRAN) IV, sodium chloride flush   Vital Signs    Vitals:   07/02/22 0000 07/02/22 0042 07/02/22 0534 07/02/22 0800  BP: 129/90 129/90 125/85 137/83  Pulse: 82 85 68 86  Resp: _0 Temp:  97.6 F (36.4 C) 97.6 F (36.4 C) 98 F (36.7 C)  TempSrc:  Oral Oral Oral  SpO2: 95% 93% 93% 93%  Weight:   93.4 kg   Height:        Intake/Output Summary (Last 24 hours) at 07/02/2022 0838 Last data filed at 07/01/2022 2000 Gross per 24 hour  Intake 480 ml  Output 1300 ml  Net -820 ml      07/02/2022    5:34 AM 07/01/2022    5:01 PM 07/01/2022    8:31 AM  Last 3 Weights  Weight (lbs) 205 lb 14.6 oz 207 lb 207 lb 9.6 oz  Weight (kg) 93.4 kg 93.895 kg 94.167 kg      Telemetry    AFib, rate controlled - Personally Reviewed  ECG    AFib, occ V-pacing, nonspecific T wave changes, long QTc 551 ms - Personally Reviewed  Physical Exam  Bold both sitting up in bed and lying  flat GEN: No acute distress.   Neck: No JVD Cardiac: Irregular, 2/6 aortic ejection murmur, early to mid peaking, no diastolic murmurs, rubs, or gallops.  Respiratory: Clear to auscultation bilaterally. GI: Soft, nontender, non-distended  MS: No edema; No deformity. Neuro:  Nonfocal  Psych: Normal affect   Labs    High Sensitivity Troponin:   Recent Labs  Lab 06/22/22 0435 06/22/22 0630 06/30/22 1049 06/30/22 1223  TROPONINIHS _1 Chemistry Recent Labs  Lab 06/30/22 1049 07/01/22 0212 07/02/22 0105  NA 132* 132* 134*  K 3.6 3.3* 3.7  CL 99 98 98  CO2 _2 GLUCOSE 165* 155* 114*  BUN _3 CREATININE 1.39* 1.37* 1.31*  CALCIUM 8.6* 8.7* 8.4*  MG  --   --  1.9  PROT 6.1* 6.3*  --   ALBUMIN 3.0* 3.1*  --   AST 48* 38  --   ALT 50* 48*  --   ALKPHOS 88 88  --   BILITOT 0.8 0.6  --   GFRNONAA 36* 37* 39*  ANIONGAP _4 Lipids No  results for input(s): "CHOL", "TRIG", "HDL", "LABVLDL", "LDLCALC", "CHOLHDL" in the last 168 hours.  Hematology Recent Labs  Lab 06/30/22 1049 07/01/22 0212 07/02/22 0105  WBC 18.1* 18.0* 13.0*  RBC 3.80* 3.82* 3.58*  HGB 10.2* 10.0* 9.4*  HCT 31.3* 30.9* 29.0*  MCV 82.4 80.9 81.0  MCH 26.8 26.2 26.3  MCHC 32.6 32.4 32.4  RDW 15.9* 15.9* 16.0*  PLT 383 426* 406*   Thyroid  Recent Labs  Lab 06/30/22 1448  TSH 3.665    BNP Recent Labs  Lab 06/30/22 1410  BNP 831.2*    DDimer No results for input(s): "DDIMER" in the last 168 hours.   Radiology    DG ESOPHAGUS W SINGLE CM (SOL OR THIN BA)  Result Date: 07/01/2022 CLINICAL DATA:  Dysphagia, abdominal pain, chest pain, vomiting. EXAM: ESOPHAGUS/BARIUM SWALLOW/TABLET STUDY TECHNIQUE: A single contrast examination was performed using thin liquid barium. Additionally, the patient swallowed a 13 mm barium tablet under fluoroscopy. The exam was performed by Candiss Norse, PA-C, and was supervised and interpreted by Kellie Simmering, D.O. FLUOROSCOPY:  Fluoroscopy time: 3 minutes, 30 seconds (22.30 mGy). COMPARISON:  CT abdomen/pelvis 06/30/2022. FINDINGS: Patulous esophagus. No evidence of fixed esophageal stricture, esophageal mass or mucosal abnormality. Moderate-to-severe intermittent esophageal dysmotility with tertiary contractions. Moderate-volume gastroesophageal reflux observed to the level of the mid esophagus. Small to moderate-sized hiatal hernia. The patient swallowed a 13 mm barium tablet, which passed freely into the stomach. IMPRESSION: 1. No evidence of esophageal stricture. 2. Patulous esophagus. 3. Moderate-to-severe intermittent esophageal dysmotility with tertiary contractions. 4. Moderate-volume gastroesophageal reflux observed to the level of the mid esophagus. 5. Small to moderate-sized hiatal hernia. Electronically Signed   By: Kellie Simmering D.O.   On: 07/01/2022 16:06   ECHOCARDIOGRAM LIMITED  Result Date: 07/01/2022    ECHOCARDIOGRAM LIMITED REPORT   Patient Name:   Faith Parker Date of Exam: 07/01/2022 Medical Rec #:  [id number]      Height:       68.0 in Accession #:    [id number]     Weight:       207.6 lb Date of Birth:  [birth year]       BSA:          2.076 m Patient Age:    88 years       BP:           134/106 mmHg Patient Gender: F              HR:           96 bpm. Exam Location:  Inpatient Procedure: Limited Echo and Saline Contrast Bubble Study Indications:    Pericardial effusion  History:        Patient has prior history of Echocardiogram examinations, most                 recent 06/30/2022.  Sonographer:    Merrie Roof RDCS Referring Phys: [phone] [facility] A ARIDA  Sonographer Comments: 600 ml of blood tinged fluid was drawn out with no complications. IMPRESSIONS  1. Left ventricular ejection fraction, by estimation, is 60 to 65%. The left ventricle has normal function.  2. Difficult to visualize bubble study, no significant interatrial shunt. Conclusion(s)/Recommendation(s): Pre pericardiocentesis shows large pericardial  effusion. Post shows resolution of pericardial effusion. FINDINGS  Left Ventricle: Left ventricular ejection fraction, by estimation, is 60 to 65%. The left ventricle has normal function. Pericardium: The pericardial effusion is circumferential. IAS/Shunts: Agitated saline contrast was given intravenously  to evaluate for intracardiac shunting. Phineas Inches Electronically signed by Phineas Inches Signature Date/Time: 07/01/2022/12:51:50 PM    Final    CARDIAC CATHETERIZATION  Result Date: 07/01/2022 Successful pericardiocentesis via the subxiphoid area.  I removed 600 mL of bloody fluid.  On echocardiogram, there was resolution of pericardial effusion.  I elected not to keep the pigtail catheter in given that I could not advance the the regular catheter provided with the pericardiocentesis kit due to what seems to be significantly fibrosed pericardium.  I ended up using a short 5 French pigtail catheter which I felt was not a good option to leave in place. Recommendations: Given that the effusion is bloody, differential diagnosis include malignancy or pericarditis with hemorrhagic transformation given that she is on anticoagulation.  I recommend holding Eliquis for at least few weeks. The fluid was sent to the lab for analysis.   ECHOCARDIOGRAM LIMITED  Result Date: 07/01/2022    ECHOCARDIOGRAM LIMITED REPORT   Patient Name:   SRAVYA GRISSOM Date of Exam: 07/01/2022 Medical Rec #:  [id number]      Height:       70.0 in Accession #:    [id number]     Weight:       200.8 lb Date of Birth:  02/16/1930       BSA:          2.091 m Patient Age:    56 years       BP:           158/97 mmHg Patient Gender: F              HR:           111 bpm. Exam Location:  Inpatient Procedure: Limited Echo, Limited Color Doppler and Cardiac Doppler Indications:    pericardial effusion  History:        Patient has prior history of Echocardiogram examinations, most                 recent 06/30/2022. Pericarditis, chronic kidney disease,                  Arrythmias:Atrial Fibrillation; Risk Factors:Hypertension and                 Dyslipidemia.  Sonographer:    Johny Chess RDCS Referring Phys: Cassie Freer O'NEAL IMPRESSIONS  1. Left ventricular ejection fraction, by estimation, is 60 to 65%. The left ventricle has normal function.  2. Large pericardial effusion. The pericardial effusion is circumferential. Moderate pleural effusion.  3. The inferior vena cava is dilated in size with <50% respiratory variability, suggesting right atrial pressure of 15 mmHg. Conclusion(s)/Recommendation(s): Mild features of cardiac tamponade, recommend clinical correlation. No significant changes from prior echo. FINDINGS  Left Ventricle: Left ventricular ejection fraction, by estimation, is 60 to 65%. The left ventricle has normal function. Pericardium: A large pericardial effusion is present. The pericardial effusion is circumferential. Venous: The inferior vena cava is dilated in size with less than 50% respiratory variability, suggesting right atrial pressure of 15 mmHg. Additional Comments: There is a moderate pleural effusion. IVC IVC diam: 2.70 cm Phineas Inches Electronically signed by Phineas Inches Signature Date/Time: 07/01/2022/10:21:29 AM    Final    ECHOCARDIOGRAM LIMITED  Result Date: 06/30/2022    ECHOCARDIOGRAM LIMITED REPORT   Patient Name:   ANTANASIA KACZYNSKI Date of Exam: 06/30/2022 Medical Rec #:  [id number]      Height:       70.0 in  Accession #:    [id number]     Weight:       200.0 lb Date of Birth:  [birth year]       BSA:          2.087 m Patient Age:    55 years       BP:           114/81 mmHg Patient Gender: F              HR:           101 bpm. Exam Location:  Inpatient Procedure: Limited Echo                       STAT ECHO Reported to: Dr Loralie Champagne on 06/30/2022 12:00:00 AM          Dr. Lake Bells O'Neal bedside for echo. Indications:    Pericardial effusion  History:        Patient has prior history of Echocardiogram examinations, most                  recent 06/23/2022.  Sonographer:    Clayton Lefort RDCS (AE) Referring Phys: [phone] HAO MENG IMPRESSIONS  1. Large pericardial effusion. The pericardial effusion is circumferential. The IVC is dilated though the RV does not appear to show diastolic collapse. No respirometry. I am concerned for early tamponade.  2. Left ventricular ejection fraction, by estimation, is 60 to 65%. The left ventricle has normal function. The left ventricle has no regional wall motion abnormalities. Left ventricular diastolic parameters are indeterminate.  3. Right ventricular systolic function is normal. The right ventricular size is normal.  4. The aortic valve is tricuspid. There is severe calcifcation of the aortic valve. not fully evaluated.  5. The inferior vena cava is dilated in size with <50% respiratory variability, suggesting right atrial pressure of 15 mmHg.  6. The patient was in atrial fibrillation. FINDINGS  Left Ventricle: Left ventricular ejection fraction, by estimation, is 60 to 65%. The left ventricle has normal function. The left ventricle has no regional wall motion abnormalities. Left ventricular diastolic parameters are indeterminate. Right Ventricle: The right ventricular size is normal. No increase in right ventricular wall thickness. Right ventricular systolic function is normal. Pericardium: A large pericardial effusion is present. The pericardial effusion is circumferential. Aortic Valve: The aortic valve is tricuspid. There is severe calcifcation of the aortic valve. Not fully evaluated. Venous: The inferior vena cava is dilated in size with less than 50% respiratory variability, suggesting right atrial pressure of 15 mmHg. IVC IVC diam: 3.00 cm Dalton McleanMD Electronically signed by Franki Monte Signature Date/Time: 06/30/2022/3:08:41 PM    Final    CT Abdomen Pelvis W Contrast  Result Date: 06/30/2022 CLINICAL DATA:  Nausea and emesis. Abdominal distension. Pallor. Abdominal pain. EXAM: CT ABDOMEN  AND PELVIS WITH CONTRAST TECHNIQUE: Multidetector CT imaging of the abdomen and pelvis was performed using the standard protocol following bolus administration of intravenous contrast. RADIATION DOSE REDUCTION: This exam was performed according to the departmental dose-optimization program which includes automated exposure control, adjustment of the mA and/or kV according to patient size and/or use of iterative reconstruction technique. CONTRAST:  173mL OMNIPAQUE IOHEXOL 350 MG/ML SOLN COMPARISON:  Multiple exams, including CT chest 10/16/2019 FINDINGS: Lower chest: Large pericardial effusion, not present on 10/16/2019. Pacer leads noted. Mild to moderate cardiomegaly. Aortic valve calcification. Thoracic aortic atherosclerotic calcification. Small to moderate hiatal hernia. Fluid distention of the distal esophagus.  Small to moderate bilateral pleural effusions with passive atelectasis. Mild scarring or atelectasis in the right middle lobe and lingula. Hepatobiliary: Accentuated density in the gallbladder, probably from sludge. Mildly accentuated enhancement in the wall of the common hepatic duct and common bile duct. No intrahepatic biliary dilatation. Indistinct stranding in the porta hepatis. Pancreas: Hypodense pancreatic tail lesion, 2.4 by 1.6 cm on image 36 series 3, internal density 9 Hounsfield units. Fluid density lesion along the inferior margin of the pancreatic body, 3.0 by 1.6 cm on image 39 series 3, internal density 2 Hounsfield units. Questionable prominence of the dorsal and ventral pancreatic duct in the pancreatic head. Possible uncinate process cystic lesion, 1.6 by 0.8 cm on image 40 series 3. Spleen: Unremarkable Adrenals/Urinary Tract: Multiple bilateral moderate to large sized benign renal cysts. 1.8 by 1.4 cm focus of hypoenhancement in the renal parenchyma adjacent to the posterior renal cyst of the left kidney upper pole on image 14 series 8 and image 39 series 3. Urinary bladder  unremarkable. The adrenal glands appear normal. Stomach/Bowel: Sigmoid colon diverticulosis, no compelling findings of active diverticulitis. Vascular/Lymphatic: Atherosclerosis is present, including aortoiliac atherosclerotic disease. There is atheromatous plaque proximally in the celiac trunk and SMA without overt occlusion. Reproductive: Unremarkable Other: Low-level edema along the root of the mesentery. Infiltrative edema in the porta hepatis. Musculoskeletal: Prominent degenerative hip arthropathy bilaterally. Bony demineralization. Left sacral nerve stimulator device. Old healed right medial eleventh rib fracture. Dextroconvex lumbar scoliosis. Mild rectus diastasis and anterior abdominal wall laxity. Lumbar spondylosis and degenerative disc disease contribute to multilevel foraminal stenosis and central narrowing of the thecal sac IMPRESSION: 1. Very large pericardial effusion, not present on 10/16/2019. Correlate with physical exam findings such as Becks triad in assessing for signs of tamponade, echocardiography may be warranted in further workup. 2. Inflammatory stranding in the porta hepatis some accentuated enhancement along the non dilated extrahepatic biliary tree. Cannot exclude cholangitis. 3. Focal hypoenhancement in the upper medial right kidney renal parenchyma, potentially from pyelonephritis or infiltrative malignancy. Correlate with urine analysis in assessing for signs of pyelonephritis. 4. Multiple hypodense, probably cystic lesions of the pancreas. The largest measures 3.0 cm in long axis. In light of the size the lesions and the patient's age, follow up pancreatic protocol CT or MRI is recommended in 2 years time. The patient does have a pacer and a sacral nerve stimulator which may preclude MRI. This recommendation follows ACR consensus guidelines: Management of Incidental Pancreatic Cysts: A White Paper of the ACR Incidental Findings Committee. [place] 7846;96:295-284. 5. Small  to moderate bilateral pleural effusions with passive atelectasis. 6. Other imaging findings of potential clinical significance: Aortic valve calcification. Aortic Atherosclerosis (ICD10-I70.0). Small to moderate hiatal hernia. Multiple bilateral moderate to large sized renal cysts. Sigmoid colon diverticulosis. Systemic atherosclerosis. Prominent degenerative hip arthropathy bilaterally. Dextroconvex lumbar scoliosis. Mild rectus diastasis. Bony demineralization. Multilevel lumbar impingement due to spondylosis and degenerative disc disease. Electronically Signed   By: Van Clines M.D.   On: 06/30/2022 13:07   DG Abd Acute W/Chest  Result Date: 06/30/2022 CLINICAL DATA:  Emesis EXAM: DG ABDOMEN ACUTE WITH 1 VIEW CHEST COMPARISON:  Radiographs 06/22/2022 FINDINGS: Mild gaseous distention of the cecum/right colon at the upper limits of normal. No dilation of the remaining colon or small. No radiopaque calculi or other significant radiographic abnormality is seen. Heart size and mediastinal contours are within normal limits. Sacral stimulator. Enlarged cardiomediastinal silhouette. Atelectasis/consolidation in the left lower lung. Scattered areas of linear  atelectasis/scarring. Possible small left pleural effusion. Aortic atherosclerotic calcification. Advanced thoracolumbar spondylosis. IMPRESSION: 1. Negative abdominal radiographs. 2. Presumed atelectasis in the left lower lung though pneumonia is difficult to exclude. Electronically Signed   By: Placido Sou M.D.   On: 06/30/2022 11:25    Cardiac Studies   ECHO 07/01/2022   1. Left ventricular ejection fraction, by estimation, is 60 to 65%. The  left ventricle has normal function.   2. Large pericardial effusion. The pericardial effusion is  circumferential. Moderate pleural effusion.   3. The inferior vena cava is dilated in size with <50% respiratory  variability, suggesting right atrial pressure of 15 mmHg.    Conclusion(s)/Recommendation(s): Mild features of cardiac tamponade,  recommend clinical correlation. No significant changes from prior echo.   Successful pericardiocentesis via the subxiphoid area.  I removed 600 mL of bloody fluid.  On echocardiogram, there was resolution of pericardial effusion.  I elected not to keep the pigtail catheter in given that I could not advance the the regular catheter provided with the pericardiocentesis kit due to what seems to be significantly fibrosed pericardium.  I ended up using a short 5 French pigtail catheter which I felt was not a good option to leave in place.   Recommendations: Given that the effusion is bloody, differential diagnosis include malignancy or pericarditis with hemorrhagic transformation given that she is on anticoagulation.  I recommend holding Eliquis for at least few weeks. The fluid was sent to the lab for analysis.   Sonographer Comments: 600 ml of blood tinged fluid was drawn out with no  complications.  IMPRESSIONS     1. Left ventricular ejection fraction, by estimation, is 60 to 65%. The  left ventricle has normal function.   2. Difficult to visualize bubble study, no significant interatrial shunt.   Conclusion(s)/Recommendation(s): Pre pericardiocentesis shows large  pericardial effusion. Post shows resolution of pericardial effusion.  Patient Profile     88 y.o. female with past medical history of persistent atrial fibrillation, chronic diastolic congestive heart failure, history of sick sinus syndrome status post pacemaker, hypothyroidism admitted with leukocytosis, chest pain and dyspnea and found to have a large pericardial effusion s/p 600 mL bloody fluid pericardiocentesis  on 07/01/2022  Assessment & Plan    Rapid clinical improvement following pericardiocentesis suggesting that the pericardial effusion was indeed the cause for her decompensation. Bloody pericardial fluid raises concern for malignancy, but may  also be due to concomitant treatment with anticoagulant. Abdominal CT shows questionable abnormalities in the porta hepatis, pancreas, left kidney none of which are convincing for true malignancy, but which will require follow-up.  Although there is concern for possible pyelonephritis or cholangitis based on CT appearance, the urinalysis and liver function test did not support either diagnosis. Pacemaker interrogation 06/08/2022 shows a marked increase in the burden of paroxysmal atrial fibrillation in the last couple of months, conceivably correlating with the onset of her pericardial disease. It will be interesting to see how the burden of atrial fibrillation behaves following pericardiocentesis. Plan to hold anticoagulant for at least 2-3 weeks.  This will come with a low/acceptable risk of embolic events.  We will check another echocardiogram late this afternoon (it's been less than 24 hours since the procedure was performed) to make sure there is not rapid reaccumulation of fluid.      For questions or updates, please contact [place] Please consult [email or web address] for contact info under        Signed, Sanda Klein, MD  07/02/2022,  8:38 AM

## 2022-07-03 ENCOUNTER — Other Ambulatory Visit: Payer: Self-pay | Admitting: Student

## 2022-07-03 ENCOUNTER — Encounter: Payer: Self-pay | Admitting: Nurse Practitioner

## 2022-07-03 DIAGNOSIS — I4891 Unspecified atrial fibrillation: Secondary | ICD-10-CM

## 2022-07-03 DIAGNOSIS — K219 Gastro-esophageal reflux disease without esophagitis: Secondary | ICD-10-CM | POA: Insufficient documentation

## 2022-07-03 DIAGNOSIS — I314 Cardiac tamponade: Secondary | ICD-10-CM | POA: Diagnosis not present

## 2022-07-03 DIAGNOSIS — D649 Anemia, unspecified: Secondary | ICD-10-CM | POA: Insufficient documentation

## 2022-07-03 DIAGNOSIS — D72829 Elevated white blood cell count, unspecified: Secondary | ICD-10-CM

## 2022-07-03 DIAGNOSIS — E785 Hyperlipidemia, unspecified: Secondary | ICD-10-CM | POA: Diagnosis not present

## 2022-07-03 DIAGNOSIS — I3139 Other pericardial effusion (noninflammatory): Secondary | ICD-10-CM | POA: Diagnosis not present

## 2022-07-03 DIAGNOSIS — I5033 Acute on chronic diastolic (congestive) heart failure: Secondary | ICD-10-CM | POA: Diagnosis not present

## 2022-07-03 DIAGNOSIS — R1013 Epigastric pain: Secondary | ICD-10-CM | POA: Insufficient documentation

## 2022-07-03 DIAGNOSIS — I300 Acute nonspecific idiopathic pericarditis: Secondary | ICD-10-CM | POA: Diagnosis not present

## 2022-07-03 LAB — BASIC METABOLIC PANEL
Anion gap: 9 (ref 5–15)
BUN: 24 mg/dL — ABNORMAL HIGH (ref 8–23)
CO2: 28 mmol/L (ref 22–32)
Calcium: 8.3 mg/dL — ABNORMAL LOW (ref 8.9–10.3)
Chloride: 95 mmol/L — ABNORMAL LOW (ref 98–111)
Creatinine, Ser: 1.32 mg/dL — ABNORMAL HIGH (ref 0.44–1.00)
GFR, Estimated: 38 mL/min — ABNORMAL LOW (ref >60)
Glucose, Bld: 96 mg/dL (ref 70–99)
Potassium: 3.5 mmol/L (ref 3.5–5.1)
Sodium: 132 mmol/L — ABNORMAL LOW (ref 135–145)

## 2022-07-03 LAB — CBC
HCT: 30.7 % — ABNORMAL LOW (ref 36.0–46.0)
Hemoglobin: 9.9 g/dL — ABNORMAL LOW (ref 12.0–15.0)
MCH: 26.1 pg (ref 26.0–34.0)
MCHC: 32.2 g/dL (ref 30.0–36.0)
MCV: 80.8 fL (ref 80.0–100.0)
Platelets: 434 10*3/uL — ABNORMAL HIGH (ref 150–400)
RBC: 3.8 MIL/uL — ABNORMAL LOW (ref 3.87–5.11)
RDW: 16 % — ABNORMAL HIGH (ref 11.5–15.5)
WBC: 12 10*3/uL — ABNORMAL HIGH (ref 4.0–10.5)
nRBC: 0 % (ref 0.0–0.2)

## 2022-07-03 LAB — MAGNESIUM: Magnesium: 2.1 mg/dL (ref 1.7–2.4)

## 2022-07-03 NOTE — Assessment & Plan Note (Signed)
Reactive leukocytosis.  No clinical sings of bacterial infection.  Wbc is trending down, on admission was predominant neutrophilic.  Plan to follow up cell count as outpatient.   Chronic anemia, follow up hgb has been stable at 9,9

## 2022-07-03 NOTE — Assessment & Plan Note (Signed)
Bun/cret 21/1.14  06/22/22

## 2022-07-03 NOTE — Assessment & Plan Note (Signed)
Pericardial effusion with early tamponade.  07/28 pericardiocentesis 600 cc of bloody fluid removed.  Follow up echocardiogram with no re accumulation of pericardial fluid.  Systolic blood pressure 136'C mmHg.   Plan to continue medical therapy with prednisone and colchicine.  Holding non steroidal anti inflammatory agents due to reduced GFR.

## 2022-07-03 NOTE — Assessment & Plan Note (Signed)
taking Levothyroxine, reduced to 28mg 03/31/22 due to TSH 0.23

## 2022-07-03 NOTE — Assessment & Plan Note (Signed)
Blood pressure is controlled, on Losartan, Atenolol

## 2022-07-03 NOTE — Assessment & Plan Note (Signed)
Continue rate control with amiodarone and atenolol. Continue to hold on anticoagulation due to bloody nature of pericardial fluid.

## 2022-07-03 NOTE — Assessment & Plan Note (Signed)
taking Furosemide, BNP 577 12/08/21<<793.1 06/22/22,  01/13/22 echo EF 60-65%

## 2022-07-03 NOTE — Assessment & Plan Note (Signed)
tachycardia noted, taking Amiodarone, Atenolol, Eliquis             Pacemaker  AS hx

## 2022-07-03 NOTE — Assessment & Plan Note (Signed)
Hgb 9.5 06/22/22

## 2022-07-03 NOTE — Progress Notes (Signed)
Location:   [facility] Room Number: [id number] Place of Service:  ALF (13) Provider: Lennie Odor Roark Rufo NP  [name], MD  Patient Care Team: [name], MD as PCP - General (Internal Medicine) Fay Records, MD as PCP - Cardiology (Cardiology)  Extended Emergency Contact Information Primary Emergency Contact: Parker,Faith Address: [address]           [place], [place] [address] [name] of [place] Phone: [phone] Mobile Phone: [phone] Relation: Daughter Secondary Emergency Contact: Parker,Faith Address: [address]          [place], [place] [address] [name] of [place] Phone: [phone] Mobile Phone: [phone] Relation: Daughter  Code Status: DNR Goals of care: Advanced Directive information    06/30/2022   10:43 AM  Advanced Directives  Does Patient Have a Medical Advance Directive? Yes     Chief Complaint  Patient presents with   Acute Visit    Stomach pain  , nausea and vomiting    HPI:  Pt is a 88 y.o. female seen today for an acute visit for nausea, vomiting, general malaise, pain in epigastric area, denied fever, chills, cough, or focal weakness.     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      Past Medical History:  Diagnosis Date   Anxiety    Aortic regurgitation    Atrial fibrillation (HCC)    Chest pain    Per new patient  form   Gastritis    GERD (gastroesophageal reflux disease)    H/O artificial eye lens    Hypertension    Hypothyroid    Left ventricular hypertrophy    Major depression    Osteoarthritis    Osteoporosis    Pacemaker    On Eliquis, Per new patient form   Peptic ulcer disease    Stress incontinence    Past Surgical History:  Procedure Laterality Date   ABDOMINAL SURGERY     Per new patient form   BLADDER SURGERY     DG  BONE DENSITY ([place])     Per new patient form   EYE SURGERY     KNEE ARTHROPLASTY     LEFT HEART CATH AND CORONARY ANGIOGRAPHY N/A 10/22/2019   Procedure: LEFT HEART CATH AND CORONARY ANGIOGRAPHY;  Surgeon: CSherren Mocha MD;  Location: [facility];  Service: Cardiovascular;  Laterality: N/A;   LEG SURGERY Right    multiple surgeries, unknown type  PACEMAKER IMPLANT N/A 06/04/2018   Procedure: PACEMAKER IMPLANT;  Surgeon: Deboraha Sprang, MD;  Location: [facility];  Service: Cardiovascular;  Laterality: N/A;   PERICARDIOCENTESIS N/A 07/01/2022   Procedure: PERICARDIOCENTESIS;  Surgeon: Wellington Hampshire, MD;  Location: [facility];  Service: Cardiovascular;  Laterality: N/A;    Allergies  Allergen Reactions   Donepezil Nausea And Vomiting and Other (See Comments)    Severe vomiting   Ciprofloxacin Other (See Comments)    Not listed on MAR   Latex Other (See Comments)    Not listed on MAR   Levofloxacin Other (See Comments)    Not listed on MAR   Macrobid [Nitrofurantoin Monohyd Macro] Other (See Comments)    Not listed on MAR   Nitrofurantoin Other (See Comments)    Not listed on MAR   Sulfa Antibiotics Other (See Comments)    Not listed on MAR   Sulfamethoxazole     Not listed on MAR    Allergies as of 06/30/2022       Reactions   Donepezil Nausea And Vomiting, Other (See Comments)   Severe vomiting   Ciprofloxacin Other (See Comments)   Not listed on MAR   Latex Other (See Comments)   Not listed on MAR    Levofloxacin Other (See Comments)   Not listed on MAR   Macrobid [nitrofurantoin Monohyd Macro] Other (See Comments)   Not listed on MAR   Nitrofurantoin Other (See Comments)   Not listed on MAR   Sulfa Antibiotics Other (See Comments)   Not listed on MAR   Sulfamethoxazole    Not listed on [facility]        Medication List      Notice   This visit is during an admission. Changes to the med list made in this visit will be reflected in the After Visit Summary of the admission.     Review of Systems  Constitutional:  Positive for activity change, appetite change and fatigue. Negative for fever.  HENT:  Negative for congestion and trouble swallowing.   Eyes:  Positive for visual disturbance.       Left eye blind, failed multiple cornea transplants   Respiratory:  Negative for cough, shortness of breath and wheezing.   Cardiovascular:  Positive for leg swelling. Negative for chest pain and palpitations.  Gastrointestinal:  Positive for abdominal pain, nausea and vomiting. Negative for constipation.       Epigastric pain.   Genitourinary:  Positive for frequency. Negative for dysuria and urgency.  Musculoskeletal:  Positive for arthralgias. Negative for gait problem.  Skin:  Positive for pallor.  Neurological:  Negative for speech difficulty and headaches.  Psychiatric/Behavioral:  Negative for behavioral problems, dysphoric mood and sleep disturbance. The patient is not nervous/anxious.     Immunization History  Administered Date(s) Administered   Influenza Split 09/13/2017, 09/06/2018, 09/18/2019   Influenza, High Dose Seasonal PF 09/04/2021   Moderna SARS-COV2 Booster Vaccination 10/13/2020   PFIZER(Purple Top)SARS-COV-2 Vaccination 12/16/2019, 01/16/2020   PNEUMOCOCCAL CONJUGATE-20 12/23/2021   Pertinent  Health Maintenance Due  Topic Date Due   INFLUENZA VACCINE  07/05/2022   DEXA SCAN  Completed      07/01/2022    8:30 AM 07/01/2022    8:00 PM 07/02/2022    9:00 AM  07/02/2022   10:00 PM 07/03/2022   10:00 AM  Fall Risk  Patient Fall Risk Level High fall risk High fall risk High fall risk High fall risk High  fall risk   Functional Status Survey:    Vitals:   07/01/22 1701  BP: 120/80  Pulse: (!) 118  Resp: 20  Temp: (!) 97.2 F (36.2 C)  SpO2: 95%  Weight: 207 lb (93.9 kg)  Height: _0  (1.727 m)   Body mass index is 31.47 kg/m. Physical Exam Vitals and nursing note reviewed.  Constitutional:      Comments: fatigue  HENT:     Head: Normocephalic and atraumatic.     Nose: Nose normal.     Mouth/Throat:     Mouth: Mucous membranes are dry.  Eyes:     Extraocular Movements: Extraocular movements intact.     Conjunctiva/sclera: Conjunctivae normal.     Pupils: Pupils are equal, round, and reactive to light.  Cardiovascular:     Rate and Rhythm: Regular rhythm. Tachycardia present.     Heart sounds: Murmur heard.     Comments: pacemaker Pulmonary:     Effort: Pulmonary effort is normal.  Abdominal:     General: Bowel sounds are normal. There is no distension.     Palpations: Abdomen is soft.     Tenderness: There is abdominal tenderness. There is no right CVA tenderness, left CVA tenderness, guarding or rebound.     Comments: Epigastric region pain palpated, complained  Musculoskeletal:     Cervical back: Normal range of motion and neck supple.     Right lower leg: Edema present.     Left lower leg: Edema present.     Comments: Trace edema BLE  Skin:    General: Skin is warm and dry.     Coloration: Skin is pale.  Neurological:     General: No focal deficit present.     Mental Status: She is alert and oriented to person, place, and time. Mental status is at baseline.     Gait: Gait abnormal.  Psychiatric:        Mood and Affect: Mood normal.        Behavior: Behavior normal.        Thought Content: Thought content normal.     Labs reviewed: Recent Labs    06/24/22 0239 06/25/22 0201 07/01/22 0212 07/02/22 0105  07/03/22 0412  NA 136   < > 132* 134* 132*  K 3.5   < > 3.3* 3.7 3.5  CL 102   < > 98 98 95*  CO2 26   < > _1 GLUCOSE 118*   < > 155* 114* 96  BUN 21   < > 16 19 24*  CREATININE 1.49*   < > 1.37* 1.31* 1.32*  CALCIUM 8.2*   < > 8.7* 8.4* 8.3*  MG 2.2  --   --  1.9 2.1   < > = values in this interval not displayed.   Recent Labs    06/22/22 0435 06/30/22 1049 07/01/22 0212  AST 27 48* 38  ALT 26 50* 48*  ALKPHOS 56 88 88  BILITOT 0.4 0.8 0.6  PROT 6.1* 6.1* 6.3*  ALBUMIN 3.4* 3.0* 3.1*   Recent Labs    05/26/22 1610 06/22/22 0435 06/30/22 1049 07/01/22 0212 07/02/22 0105 07/03/22 0412  WBC 11.6 8.4 18.1* 18.0* 13.0* 12.0*  NEUTROABS 10,463.00 7.2 16.0*  --   --   --   HGB 9.6* 9.5* 10.2* 10.0* 9.4* 9.9*  HCT 31* 29.2* 31.3* 30.9* 29.0* 30.7*  MCV  --  84.6 82.4 80.9 81.0 80.8  PLT  --  208 383  426* 406* 434*   Lab Results  Component Value Date   TSH 3.665 06/30/2022   No results found for: "HGBA1C" Lab Results  Component Value Date   CHOL 138 02/08/2022   HDL 71 (A) 02/08/2022   LDLCALC 55 02/08/2022   TRIG 42 02/08/2022    Significant Diagnostic Results in last 30 days:  ECHOCARDIOGRAM LIMITED  Result Date: 07/02/2022    ECHOCARDIOGRAM LIMITED REPORT   Patient Name:   Faith Parker Date of Exam: 07/02/2022 Medical Rec #:  [id number]      Height:       68.0 in Accession #:    [id number]     Weight:       205.9 lb Date of Birth:  [birth year]       BSA:          2.069 m Patient Age:    73 years       BP:           118/80 mmHg Patient Gender: F              HR:           73 bpm. Exam Location:  Inpatient Procedure: 3D Echo, Cardiac Doppler and Color Doppler Indications:    I31.3 Pericardial effusion (noninflammatory)  History:        Patient has prior history of Echocardiogram examinations, most                 recent 07/01/2022. CHF, Abnormal ECG and Pacemaker,                 Arrythmias:Atrial Fibrillation and Bradycardia,                  Signs/Symptoms:Dyspnea and Syncope; Risk Factors:Dyslipidemia.                 Tamponade.  Sonographer:    Roseanna Rainbow RDCS Referring Phys: [place]  1. Left ventricular ejection fraction, by estimation, is 60 to 65%. The left ventricle has normal function.  2. Right ventricular systolic function is normal. The right ventricular size is normal. There is normal pulmonary artery systolic pressure.  3. Residual predominantly small pericardial effusion; largest along the lateral/posterior wall. No rapid accumulation of fluid. There is no evidence of cardiac tamponade. Large pleural effusion. FINDINGS  Left Ventricle: Left ventricular ejection fraction, by estimation, is 60 to 65%. The left ventricle has normal function. Right Ventricle: The right ventricular size is normal. Right ventricular systolic function is normal. There is normal pulmonary artery systolic pressure. The tricuspid regurgitant velocity is 2.10 m/s, and with an assumed right atrial pressure of 3 mmHg,  the estimated right ventricular systolic pressure is 61.9 mmHg. Pericardium: Residual predominantly small pericardial effusion; largest along the lateral/posterior wall. No rapid accumulation of fluid. There is no evidence of cardiac tamponade. Additional Comments: There is a large pleural effusion. LEFT VENTRICLE PLAX 2D LVIDd:         4.00 cm LVIDs:         2.50 cm LV PW:         1.20 cm LV IVS:        1.70 cm  IVC IVC diam: 2.10 cm LEFT ATRIUM         Index LA diam:    2.90 cm 1.40 cm/m   AORTA Ao Root diam: 3.50 cm TRICUSPID VALVE TR Peak grad:   17.6 mmHg TR Vmax:  210.00 cm/s Phineas Inches Electronically signed by Phineas Inches Signature Date/Time: 07/02/2022/4:47:07 PM    Final    DG ESOPHAGUS W SINGLE CM (SOL OR THIN BA)  Result Date: 07/01/2022 CLINICAL DATA:  Dysphagia, abdominal pain, chest pain, vomiting. EXAM: ESOPHAGUS/BARIUM SWALLOW/TABLET STUDY TECHNIQUE: A single contrast examination was performed using thin  liquid barium. Additionally, the patient swallowed a 13 mm barium tablet under fluoroscopy. The exam was performed by Candiss Norse, PA-C, and was supervised and interpreted by Kellie Simmering, D.O. FLUOROSCOPY: Fluoroscopy time: 3 minutes, 30 seconds (22.30 mGy). COMPARISON:  CT abdomen/pelvis 06/30/2022. FINDINGS: Patulous esophagus. No evidence of fixed esophageal stricture, esophageal mass or mucosal abnormality. Moderate-to-severe intermittent esophageal dysmotility with tertiary contractions. Moderate-volume gastroesophageal reflux observed to the level of the mid esophagus. Small to moderate-sized hiatal hernia. The patient swallowed a 13 mm barium tablet, which passed freely into the stomach. IMPRESSION: 1. No evidence of esophageal stricture. 2. Patulous esophagus. 3. Moderate-to-severe intermittent esophageal dysmotility with tertiary contractions. 4. Moderate-volume gastroesophageal reflux observed to the level of the mid esophagus. 5. Small to moderate-sized hiatal hernia. Electronically Signed   By: Kellie Simmering D.O.   On: 07/01/2022 16:06   ECHOCARDIOGRAM LIMITED  Result Date: 07/01/2022    ECHOCARDIOGRAM LIMITED REPORT   Patient Name:   Faith Parker Date of Exam: 07/01/2022 Medical Rec #:  [id number]      Height:       68.0 in Accession #:    [id number]     Weight:       207.6 lb Date of Birth:  03/24/1930       BSA:          2.076 m Patient Age:    18 years       BP:           134/106 mmHg Patient Gender: F              HR:           96 bpm. Exam Location:  Inpatient Procedure: Limited Echo and Saline Contrast Bubble Study Indications:    Pericardial effusion  History:        Patient has prior history of Echocardiogram examinations, most                 recent 06/30/2022.  Sonographer:    Merrie Roof RDCS Referring Phys: [phone] [facility] A ARIDA  Sonographer Comments: 600 ml of blood tinged fluid was drawn out with no complications. IMPRESSIONS  1. Left ventricular ejection fraction, by estimation, is  60 to 65%. The left ventricle has normal function.  2. Difficult to visualize bubble study, no significant interatrial shunt. Conclusion(s)/Recommendation(s): Pre pericardiocentesis shows large pericardial effusion. Post shows resolution of pericardial effusion. FINDINGS  Left Ventricle: Left ventricular ejection fraction, by estimation, is 60 to 65%. The left ventricle has normal function. Pericardium: The pericardial effusion is circumferential. IAS/Shunts: Agitated saline contrast was given intravenously to evaluate for intracardiac shunting. Phineas Inches Electronically signed by Phineas Inches Signature Date/Time: 07/01/2022/12:51:50 PM    Final    CARDIAC CATHETERIZATION  Result Date: 07/01/2022 Successful pericardiocentesis via the subxiphoid area.  I removed 600 mL of bloody fluid.  On echocardiogram, there was resolution of pericardial effusion.  I elected not to keep the pigtail catheter in given that I could not advance the the regular catheter provided with the pericardiocentesis kit due to what seems to be significantly fibrosed pericardium.  I ended up using a short 5 French pigtail catheter  which I felt was not a good option to leave in place. Recommendations: Given that the effusion is bloody, differential diagnosis include malignancy or pericarditis with hemorrhagic transformation given that she is on anticoagulation.  I recommend holding Eliquis for at least few weeks. The fluid was sent to the lab for analysis.   ECHOCARDIOGRAM LIMITED  Result Date: 07/01/2022    ECHOCARDIOGRAM LIMITED REPORT   Patient Name:   Faith Parker Date of Exam: 07/01/2022 Medical Rec #:  [id number]      Height:       70.0 in Accession #:    [id number]     Weight:       200.8 lb Date of Birth:  July 30, 1930       BSA:          2.091 m Patient Age:    26 years       BP:           158/97 mmHg Patient Gender: F              HR:           111 bpm. Exam Location:  Inpatient Procedure: Limited Echo, Limited Color Doppler and  Cardiac Doppler Indications:    pericardial effusion  History:        Patient has prior history of Echocardiogram examinations, most                 recent 06/30/2022. Pericarditis, chronic kidney disease,                 Arrythmias:Atrial Fibrillation; Risk Factors:Hypertension and                 Dyslipidemia.  Sonographer:    Johny Chess RDCS Referring Phys: Cassie Freer [place] IMPRESSIONS  1. Left ventricular ejection fraction, by estimation, is 60 to 65%. The left ventricle has normal function.  2. Large pericardial effusion. The pericardial effusion is circumferential. Moderate pleural effusion.  3. The inferior vena cava is dilated in size with <50% respiratory variability, suggesting right atrial pressure of 15 mmHg. Conclusion(s)/Recommendation(s): Mild features of cardiac tamponade, recommend clinical correlation. No significant changes from prior echo. FINDINGS  Left Ventricle: Left ventricular ejection fraction, by estimation, is 60 to 65%. The left ventricle has normal function. Pericardium: A large pericardial effusion is present. The pericardial effusion is circumferential. Venous: The inferior vena cava is dilated in size with less than 50% respiratory variability, suggesting right atrial pressure of 15 mmHg. Additional Comments: There is a moderate pleural effusion. IVC IVC diam: 2.70 cm Phineas Inches Electronically signed by Phineas Inches Signature Date/Time: 07/01/2022/10:21:29 AM    Final    ECHOCARDIOGRAM LIMITED  Result Date: 06/30/2022    ECHOCARDIOGRAM LIMITED REPORT   Patient Name:   Faith Parker Date of Exam: 06/30/2022 Medical Rec #:  [id number]      Height:       70.0 in Accession #:    [id number]     Weight:       200.0 lb Date of Birth:  Parker 26, 1931       BSA:          2.087 m Patient Age:    55 years       BP:           114/81 mmHg Patient Gender: F              HR:           101 bpm.  Exam Location:  Inpatient Procedure: Limited Echo                       STAT ECHO Reported to: Dr  Loralie Champagne on 06/30/2022 12:00:00 AM          Dr. Lake Bells [place] bedside for echo. Indications:    Pericardial effusion  History:        Patient has prior history of Echocardiogram examinations, most                 recent 06/23/2022.  Sonographer:    Clayton Lefort RDCS (AE) Referring Phys: [id number] HAO MENG IMPRESSIONS  1. Large pericardial effusion. The pericardial effusion is circumferential. The IVC is dilated though the RV does not appear to show diastolic collapse. No respirometry. I am concerned for early tamponade.  2. Left ventricular ejection fraction, by estimation, is 60 to 65%. The left ventricle has normal function. The left ventricle has no regional wall motion abnormalities. Left ventricular diastolic parameters are indeterminate.  3. Right ventricular systolic function is normal. The right ventricular size is normal.  4. The aortic valve is tricuspid. There is severe calcifcation of the aortic valve. not fully evaluated.  5. The inferior vena cava is dilated in size with <50% respiratory variability, suggesting right atrial pressure of 15 mmHg.  6. The patient was in atrial fibrillation. FINDINGS  Left Ventricle: Left ventricular ejection fraction, by estimation, is 60 to 65%. The left ventricle has normal function. The left ventricle has no regional wall motion abnormalities. Left ventricular diastolic parameters are indeterminate. Right Ventricle: The right ventricular size is normal. No increase in right ventricular wall thickness. Right ventricular systolic function is normal. Pericardium: A large pericardial effusion is present. The pericardial effusion is circumferential. Aortic Valve: The aortic valve is tricuspid. There is severe calcifcation of the aortic valve. Not fully evaluated. Venous: The inferior vena cava is dilated in size with less than 50% respiratory variability, suggesting right atrial pressure of 15 mmHg. IVC IVC diam: 3.00 cm Dalton McleanMD Electronically signed by Franki Monte Signature Date/Time: 06/30/2022/3:08:41 PM    Final    CT Abdomen Pelvis W Contrast  Result Date: 06/30/2022 CLINICAL DATA:  Nausea and emesis. Abdominal distension. Pallor. Abdominal pain. EXAM: CT ABDOMEN AND PELVIS WITH CONTRAST TECHNIQUE: Multidetector CT imaging of the abdomen and pelvis was performed using the standard protocol following bolus administration of intravenous contrast. RADIATION DOSE REDUCTION: This exam was performed according to the departmental dose-optimization program which includes automated exposure control, adjustment of the mA and/or kV according to patient size and/or use of iterative reconstruction technique. CONTRAST:  168m OMNIPAQUE IOHEXOL 350 MG/ML SOLN COMPARISON:  Multiple exams, including CT chest 10/16/2019 FINDINGS: Lower chest: Large pericardial effusion, not present on 10/16/2019. Pacer leads noted. Mild to moderate cardiomegaly. Aortic valve calcification. Thoracic aortic atherosclerotic calcification. Small to moderate hiatal hernia. Fluid distention of the distal esophagus. Small to moderate bilateral pleural effusions with passive atelectasis. Mild scarring or atelectasis in the right middle lobe and lingula. Hepatobiliary: Accentuated density in the gallbladder, probably from sludge. Mildly accentuated enhancement in the wall of the common hepatic duct and common bile duct. No intrahepatic biliary dilatation. Indistinct stranding in the porta hepatis. Pancreas: Hypodense pancreatic tail lesion, 2.4 by 1.6 cm on image 36 series 3, internal density 9 Hounsfield units. Fluid density lesion along the inferior margin of the pancreatic body, 3.0 by 1.6 cm on image 39 series 3, internal density 2 Hounsfield  units. Questionable prominence of the dorsal and ventral pancreatic duct in the pancreatic head. Possible uncinate process cystic lesion, 1.6 by 0.8 cm on image 40 series 3. Spleen: Unremarkable Adrenals/Urinary Tract: Multiple bilateral moderate to large sized  benign renal cysts. 1.8 by 1.4 cm focus of hypoenhancement in the renal parenchyma adjacent to the posterior renal cyst of the left kidney upper pole on image 14 series 8 and image 39 series 3. Urinary bladder unremarkable. The adrenal glands appear normal. Stomach/Bowel: Sigmoid colon diverticulosis, no compelling findings of active diverticulitis. Vascular/Lymphatic: Atherosclerosis is present, including aortoiliac atherosclerotic disease. There is atheromatous plaque proximally in the celiac trunk and SMA without overt occlusion. Reproductive: Unremarkable Other: Low-level edema along the root of the mesentery. Infiltrative edema in the porta hepatis. Musculoskeletal: Prominent degenerative hip arthropathy bilaterally. Bony demineralization. Left sacral nerve stimulator device. Old healed right medial eleventh rib fracture. Dextroconvex lumbar scoliosis. Mild rectus diastasis and anterior abdominal wall laxity. Lumbar spondylosis and degenerative disc disease contribute to multilevel foraminal stenosis and central narrowing of the thecal sac IMPRESSION: 1. Very large pericardial effusion, not present on 10/16/2019. Correlate with physical exam findings such as Becks triad in assessing for signs of tamponade, echocardiography Parker be warranted in further workup. 2. Inflammatory stranding in the porta hepatis some accentuated enhancement along the non dilated extrahepatic biliary tree. Cannot exclude cholangitis. 3. Focal hypoenhancement in the upper medial right kidney renal parenchyma, potentially from pyelonephritis or infiltrative malignancy. Correlate with urine analysis in assessing for signs of pyelonephritis. 4. Multiple hypodense, probably cystic lesions of the pancreas. The largest measures 3.0 cm in long axis. In light of the size the lesions and the patient's age, follow up pancreatic protocol CT or MRI is recommended in 2 years time. The patient does have a pacer and a sacral nerve stimulator which Parker  preclude MRI. This recommendation follows ACR consensus guidelines: Management of Incidental Pancreatic Cysts: A White Paper of the ACR Incidental Findings Committee. [place] 9323;55:[id number]. 5. Small to moderate bilateral pleural effusions with passive atelectasis. 6. Other imaging findings of potential clinical significance: Aortic valve calcification. Aortic Atherosclerosis (ICD10-I70.0). Small to moderate hiatal hernia. Multiple bilateral moderate to large sized renal cysts. Sigmoid colon diverticulosis. Systemic atherosclerosis. Prominent degenerative hip arthropathy bilaterally. Dextroconvex lumbar scoliosis. Mild rectus diastasis. Bony demineralization. Multilevel lumbar impingement due to spondylosis and degenerative disc disease. Electronically Signed   By: Van Clines M.D.   On: 06/30/2022 13:07   DG Abd Acute W/Chest  Result Date: 06/30/2022 CLINICAL DATA:  Emesis EXAM: DG ABDOMEN ACUTE WITH 1 VIEW CHEST COMPARISON:  Radiographs 06/22/2022 FINDINGS: Mild gaseous distention of the cecum/right colon at the upper limits of normal. No dilation of the remaining colon or small. No radiopaque calculi or other significant radiographic abnormality is seen. Heart size and mediastinal contours are within normal limits. Sacral stimulator. Enlarged cardiomediastinal silhouette. Atelectasis/consolidation in the left lower lung. Scattered areas of linear atelectasis/scarring. Possible small left pleural effusion. Aortic atherosclerotic calcification. Advanced thoracolumbar spondylosis. IMPRESSION: 1. Negative abdominal radiographs. 2. Presumed atelectasis in the left lower lung though pneumonia is difficult to exclude. Electronically Signed   By: Placido Sou M.D.   On: 06/30/2022 11:25   ECHOCARDIOGRAM COMPLETE  Result Date: 06/23/2022    ECHOCARDIOGRAM REPORT   Patient Name:   Faith Parker Date of Exam: 06/23/2022 Medical Rec #:  [id number]      Height:       68.0 in Accession #:     [id number]  Weight:       196.2 lb Date of Birth:  1930-03-22       BSA:          2.027 m Patient Age:    89 years       BP:           111/70 mmHg Patient Gender: F              HR:           81 bpm. Exam Location:  Inpatient Procedure: 2D Echo, Cardiac Doppler and Color Doppler Indications:    Acute respiratory distress  History:        Patient has prior history of Echocardiogram examinations, most                 recent 01/13/2022. Pacemaker, Arrythmias:Atrial Fibrillation; Risk                 Factors:Hypertension.  Sonographer:    Joette Catching RCS Referring Phys: [place]  1. Moderate hypertrophy of the basal septum with otherwise mild LVH. Left ventricular ejection fraction, by estimation, is 60 to 65%. The left ventricle has normal function. The left ventricle has no regional wall motion abnormalities. There is moderate  asymmetric left ventricular hypertrophy. Left ventricular diastolic parameters are indeterminate. Elevated left ventricular end-diastolic pressure.  2. Right ventricular systolic function is normal. The right ventricular size is normal. There is mildly elevated pulmonary artery systolic pressure.  3. Left atrial size was severely dilated.  4. A small pericardial effusion is present. The pericardial effusion is posterior and lateral to the left ventricle. There is no evidence of cardiac tamponade. Moderate pleural effusion in the left lateral region.  5. The mitral valve is normal in structure. Trivial mitral valve regurgitation. No evidence of mitral stenosis.  6. The aortic valve is tricuspid. There is moderate calcification of the aortic valve. There is moderate thickening of the aortic valve. Aortic valve regurgitation is mild. Moderate aortic valve stenosis. Aortic valve area, by VTI measures 1.95 cm. Aortic valve mean gradient measures 22.0 mmHg. Aortic valve Vmax measures 2.93 m/s.  7. Aortic dilatation noted. There is mild dilatation of the aortic root,  measuring 37 mm. There is moderate dilatation of the ascending aorta, measuring 46 mm.  8. The inferior vena cava is dilated in size with >50% respiratory variability, suggesting right atrial pressure of 8 mmHg. FINDINGS  Left Ventricle: Moderate hypertrophy of the basal septum with otherwise mild LVH. Left ventricular ejection fraction, by estimation, is 60 to 65%. The left ventricle has normal function. The left ventricle has no regional wall motion abnormalities. The left ventricular internal cavity size was normal in size. There is moderate asymmetric left ventricular hypertrophy. Left ventricular diastolic function could not be evaluated due to atrial fibrillation. Left ventricular diastolic parameters are indeterminate. Elevated left ventricular end-diastolic pressure. Right Ventricle: The right ventricular size is normal. No increase in right ventricular wall thickness. Right ventricular systolic function is normal. There is mildly elevated pulmonary artery systolic pressure. The tricuspid regurgitant velocity is 2.98  m/s, and with an assumed right atrial pressure of 8 mmHg, the estimated right ventricular systolic pressure is 20.2 mmHg. Left Atrium: Left atrial size was severely dilated. Right Atrium: Right atrial size was normal in size. Pericardium: A small pericardial effusion is present. The pericardial effusion is posterior and lateral to the left ventricle. There is no evidence of cardiac tamponade. Mitral Valve: The mitral valve is normal  in structure. Trivial mitral valve regurgitation. No evidence of mitral valve stenosis. Tricuspid Valve: The tricuspid valve is normal in structure. Tricuspid valve regurgitation is mild . No evidence of tricuspid stenosis. Aortic Valve: The aortic valve is tricuspid. There is moderate calcification of the aortic valve. There is moderate thickening of the aortic valve. Aortic valve regurgitation is mild. Aortic regurgitation PHT measures 705 msec. Moderate aortic  stenosis is present. Aortic valve mean gradient measures 22.0 mmHg. Aortic valve peak gradient measures 34.3 mmHg. Aortic valve area, by VTI measures 1.95 cm. Pulmonic Valve: The pulmonic valve was normal in structure. Pulmonic valve regurgitation is trivial. No evidence of pulmonic stenosis. Aorta: Aortic dilatation noted. There is mild dilatation of the aortic root, measuring 37 mm. There is moderate dilatation of the ascending aorta, measuring 46 mm. Venous: The inferior vena cava is dilated in size with greater than 50% respiratory variability, suggesting right atrial pressure of 8 mmHg. IAS/Shunts: No atrial level shunt detected by color flow Doppler. Additional Comments: There is a moderate pleural effusion in the left lateral region.  LEFT VENTRICLE PLAX 2D LVIDd:         3.95 cm   Diastology LVIDs:         2.25 cm   LV e' medial:    5.15 cm/s LV PW:         1.15 cm   LV E/e' medial:  24.3 LV IVS:        1.35 cm   LV e' lateral:   10.30 cm/s LVOT diam:     2.30 cm   LV E/e' lateral: 12.1 LV SV:         107 LV SV Index:   53 LVOT Area:     4.15 cm  RIGHT VENTRICLE            IVC RV Basal diam:  4.10 cm    IVC diam: 2.90 cm RV Mid diam:    2.30 cm RV S prime:     8.93 cm/s TAPSE (M-mode): 1.3 cm LEFT ATRIUM             Index        RIGHT ATRIUM           Index LA diam:        4.00 cm 1.97 cm/m   RA Area:     21.00 cm LA Vol (A2C):   74.1 ml 36.55 ml/m  RA Volume:   49.80 ml  24.57 ml/m LA Vol (A4C):   81.4 ml 40.15 ml/m LA Biplane Vol: 80.9 ml 39.91 ml/m  AORTIC VALVE AV Area (Vmax):    1.74 cm AV Area (Vmean):   1.83 cm AV Area (VTI):     1.95 cm AV Vmax:           293.00 cm/s AV Vmean:          205.500 cm/s AV VTI:            0.548 m AV Peak Grad:      34.3 mmHg AV Mean Grad:      22.0 mmHg LVOT Vmax:         122.50 cm/s LVOT Vmean:        90.300 cm/s LVOT VTI:          0.258 m LVOT/AV VTI ratio: 0.47 AI PHT:            705 msec  AORTA Ao Root diam: 3.65 cm Ao Asc diam:  4.60  cm MITRAL VALVE                 TRICUSPID VALVE MV Area (PHT): 3.76 cm     TV Peak grad:   30.2 mmHg MV Decel Time: 202 msec     TV Vmax:        2.75 m/s MV E velocity: 125.00 cm/s  TR Peak grad:   35.5 mmHg MV A velocity: 31.50 cm/s   TR Vmax:        298.00 cm/s MV E/A ratio:  3.97                             SHUNTS                             Systemic VTI:  0.26 m                             Systemic Diam: 2.30 cm Skeet Latch MD Electronically signed by Skeet Latch MD Signature Date/Time: 06/23/2022/4:05:09 PM    Final    DG Chest Portable 1 View  Result Date: 06/22/2022 CLINICAL DATA:  Chest pain. EXAM: PORTABLE CHEST 1 VIEW COMPARISON:  12/08/2018. FINDINGS: Heart is enlarged and the mediastinal contour is stable. Atherosclerotic calcification of the aorta is noted. A dual lead pacemaker is present over the left chest. Increased atelectasis or infiltrate is noted at the lung bases, greater on the left than on the right. There is a small left pleural effusion. No pneumothorax. No acute osseous abnormality. IMPRESSION: 1. Increased atelectasis or infiltrate at the lung bases, greater on the left than on the right. 2. Small left pleural effusion. Electronically Signed   By: Brett Fairy M.D.   On: 06/22/2022 04:50   CUP PACEART REMOTE DEVICE CHECK  Result Date: 06/11/2022 Scheduled remote reviewed. Normal device function.  AF burden 45%, becoming more persistent per trends. On [place] Next remote 91 days.   Assessment/Plan: Epigastric pain nausea, vomiting, general malaise, pain in epigastric area, denied fever, chills, cough, or focal weakness.  ED to evaluation and treat.   Atrial fibrillation (HCC)  tachycardia noted, taking Amiodarone, Atenolol, Eliquis             Pacemaker  AS hx  Chronic UTI  UTI suppression therapy Keflex 268m qd  Recurrent depression (HCC) Stable, on Fluoxetine  Peripheral edema taking Furosemide, BNP 577 12/08/21<<793.1 06/22/22,  01/13/22 echo EF 60-65%  Hypothyroidism  taking  Levothyroxine, reduced to 852m 03/31/22 due to TSH 0.23  Hypertension Blood pressure is controlled, on Losartan, Atenolol  CKD (chronic kidney disease) stage 3, GFR 30-59 ml/min (HCC) Bun/cret 21/1.14  06/22/22  Anemia  Hgb 9.5 06/22/22  Chronic GERD Takes Omeprazole.   Hyperlipidemia on Rosuvastatin  Osteoarthritis, multiple sites taking Tramadol    Family/ staff Communication: plan of care reviewed with the patient and charge nurse.   Labs/tests ordered:  none  Time spend 40 minutes.

## 2022-07-03 NOTE — Progress Notes (Signed)
Progress Note   Patient: Faith Parker EXB:[id number] DOB: 1930-10-20 DOA: 06/30/2022     3 DOS: the patient was seen and examined on 07/03/2022   Brief hospital course:  MARSHALL ROEHRICH was admitted to the hospital with the working diagnosis of pericardial effusion.  88 y.o. female with medical history significant of chronic A-fib, HTN, chronic HFpEF, hypothyroidism, recurrent UTI on suppression antibiotics, presented to the hospital with  with intermittent chest pain and shortness of breath.  Patient was recently hospitalized for similar problem and was treated for heart failure with fluid overload with aggressive diuresis.  Patient however continued to have intermittent chest pain and shortness of breath with nausea and vomiting. On her initial physical examination her blood pressure was 151/93, HR 119, RR 21 and 02 saturation 93%, lungs with no wheezing or rales, heart with S1 and S2 present irregularly irregular, abdomen not distended and no lower extremity edema.   Na 132, K 3,6 Cl 99 bicarbonate 23, glucose 165 bun 16 cr 1,39  Wbc 18,1 hgb 10.2 plt 383   Chest radiograph with right rotation and cardiomegaly, no infiltrates. Pacemaker in place with one atrial and one ventricular lead.   CT abdomen with very large pericardial effusion (new). Inflammatory stranding in the aorta hepatis and enhancement along the non dilated extrahepatic biliary tree. Focal hypo-enhancement in the upper medial right kidney renal parenchyma. Multiple cystic lesions in the pancreas. Small moderate bilateral pleural effusions.   EKG 109 bpm, normal axis, qtc 551, atrial fibrillation rhythm, with one pac and one ventricular paced beat, no significant ST segment or T wave changes. Low voltage but not pulsus alternans.   Anticoagulation was held.   Echocardiogram confirmed large pericardial effusion and patient underwent pericardiocentesis, draining 600 cc of bloody fluid.   Patient was placed on cholchicine and  prednisone for acute pericarditis. Follow up echocardiogram with no signs of fluid re accumulation.   Assessment and Plan: * Pericarditis Pericardial effusion with early tamponade.  07/28 pericardiocentesis 600 cc of bloody fluid removed.  Follow up echocardiogram with no re accumulation of pericardial fluid.  Systolic blood pressure 102'V mmHg.   Plan to continue medical therapy with prednisone and colchicine.  Holding non steroidal anti inflammatory agents due to reduced GFR.    Acute on chronic diastolic CHF (congestive heart failure) ([place]) Patient clinically euvolemic  Plan to continue to hold on diuretic therapy and continue close blood pressure monitoring.   Atrial fibrillation (HCC) Continue rate control with amiodarone and atenolol. Continue to hold on anticoagulation due to bloody nature of pericardial fluid.   CKD (chronic kidney disease) stage 3, GFR 30-59 ml/min (HCC) Hypokalemia, hyponatremia.   Renal function with serum cr at 1.32 with K at 3,5 and serum bicarbonate at 28 Continue to hold on diuretic therapy for now. Follow up renal function in am.   Hyperlipidemia Continue with statin therapy.   Hypothyroidism Continue with levothyroxine.   Leukocytosis Reactive leukocytosis.  No clinical sings of bacterial infection.  Wbc is trending down, on admission was predominant neutrophilic.  Plan to follow up cell count as outpatient.   Chronic anemia, follow up hgb has been stable at 9,9         Subjective: Patient with no chest pain or dyspnea, she feels deconditioned and no comfortable in going home today.   Physical Exam: Vitals:   07/03/22 0353 07/03/22 0743 07/03/22 1000 07/03/22 1100  BP: 122/84 128/85 127/88 130/82  Pulse: 76 83 85 79  Resp: 20  18 (!) 21 20  Temp: 97.6 F (36.4 C) 97.7 F (36.5 C)  98.1 F (36.7 C)  TempSrc: Oral Oral  Oral  SpO2: 95% 94% 92% 92%  Weight: 92.2 kg     Height:       Neurology awake and alert ENT with  mild pallor Cardiovascular with S1 and S2 present, irregularly irregular with no gallops or murmurs. No rubs No JVD No lower extremity edema Respiratory with no rales or wheezing Abdomen with no distention  Data Reviewed:    Family Communication: I spoke with patient's daughters at the bedside, we talked in detail about patient's condition, plan of care and prognosis and all questions were addressed.   Disposition: Status is: Inpatient Remains inpatient appropriate because: post pericardiocentesis   Planned Discharge Destination: Home      Author: Tawni Millers, MD 07/03/2022 12:10 PM  For on call review [email or web address].

## 2022-07-03 NOTE — Assessment & Plan Note (Signed)
on Rosuvastatin

## 2022-07-03 NOTE — Assessment & Plan Note (Signed)
Patient's volume has remained stable, plan to resume diuresis at the time of her discharge.  Resume losartan and antenolo. Plan to follow up as outpatient.

## 2022-07-03 NOTE — Assessment & Plan Note (Signed)
Continue with statin therapy.  ?

## 2022-07-03 NOTE — Assessment & Plan Note (Signed)
taking Tramadol

## 2022-07-03 NOTE — Assessment & Plan Note (Signed)
UTI suppression therapy Keflex '250mg'$  qd

## 2022-07-03 NOTE — Assessment & Plan Note (Addendum)
Takes Omeprazole.

## 2022-07-03 NOTE — Assessment & Plan Note (Signed)
nausea, vomiting, general malaise, pain in epigastric area, denied fever, chills, cough, or focal weakness.  ED to evaluation and treat.

## 2022-07-03 NOTE — Assessment & Plan Note (Signed)
Continue with levothyroxine  

## 2022-07-03 NOTE — Progress Notes (Signed)
Progress Note  Patient Name: Faith Parker Date of Encounter: 07/03/2022  [facility] Cardiologist: Dorris Carnes, MD   Subjective   Feels well.  Denies chest pain or dyspnea either while supine or when getting out of bed.  Legs are very wobbly.  Inpatient Medications    Scheduled Meds:  amiodarone  200 mg Oral BID   atenolol  25 mg Oral Daily   cephALEXin  250 mg Oral Daily   cholecalciferol  5,000 Units Oral Daily   colchicine  0.6 mg Oral Q48H   FLUoxetine  20 mg Oral Daily   levothyroxine  88 mcg Oral Q0600   omega-3 acid ethyl esters  2,000 mg Oral Daily   pantoprazole  40 mg Oral Daily   predniSONE  20 mg Oral Q breakfast   rosuvastatin  20 mg Oral QPM   sodium chloride flush  3 mL Intravenous Q12H   traMADol  50 mg Oral Daily   Continuous Infusions:  sodium chloride     PRN Meds: sodium chloride, acetaminophen, alum & mag hydroxide-simeth, metoprolol tartrate, ondansetron (ZOFRAN) IV, sodium chloride flush   Vital Signs    Vitals:   07/02/22 1923 07/03/22 0045 07/03/22 0353 07/03/22 0743  BP: 91/67 128/76 122/84 128/85  Pulse: 85 73 76 83  Resp: 20 19 20 18   Temp: (!) 97.4 F (36.3 C) 97.6 F (36.4 C) 97.6 F (36.4 C) 97.7 F (36.5 C)  TempSrc: Oral Oral Oral Oral  SpO2: 94% 95% 95% 94%  Weight:   92.2 kg   Height:        Intake/Output Summary (Last 24 hours) at 07/03/2022 1032 Last data filed at 07/03/2022 0900 Gross per 24 hour  Intake 600 ml  Output 850 ml  Net -250 ml      07/03/2022    3:53 AM 07/02/2022    5:34 AM 07/01/2022    5:01 PM  Last 3 Weights  Weight (lbs) 203 lb 4.2 oz 205 lb 14.6 oz 207 lb  Weight (kg) 92.2 kg 93.4 kg 93.895 kg      Telemetry    Atrial fibrillation with controlled ventricular response- Personally Reviewed  ECG    No new tracing- Personally Reviewed  Physical Exam  Appears very comfortable lying fully supine in bed GEN: No acute distress.   Neck: No JVD Cardiac: Irregular, mid peaking 3/6 aortic  ejection murmur heard broadly throughout the precordium, no diastolic murmurs, rubs, or gallops.  Respiratory: Clear to auscultation bilaterally. GI: Soft, nontender, non-distended  MS: No edema; No deformity. Neuro:  Nonfocal  Psych: Normal affect   Labs    High Sensitivity Troponin:   Recent Labs  Lab 06/22/22 0435 06/22/22 0630 06/30/22 1049 06/30/22 1223  TROPONINIHS 9 9 9 9      Chemistry Recent Labs  Lab 06/30/22 1049 07/01/22 0212 07/02/22 0105 07/03/22 0412  NA 132* 132* 134* 132*  K 3.6 3.3* 3.7 3.5  CL 99 98 98 95*  CO2 23 24 28 28   GLUCOSE 165* 155* 114* 96  BUN 16 16 19  24*  CREATININE 1.39* 1.37* 1.31* 1.32*  CALCIUM 8.6* 8.7* 8.4* 8.3*  MG  --   --  1.9 2.1  PROT 6.1* 6.3*  --   --   ALBUMIN 3.0* 3.1*  --   --   AST 48* 38  --   --   ALT 50* 48*  --   --   ALKPHOS 88 88  --   --  BILITOT 0.8 0.6  --   --   GFRNONAA 36* 37* 39* 38*  ANIONGAP 10 10 8 9     Lipids No results for input(s): "CHOL", "TRIG", "HDL", "LABVLDL", "LDLCALC", "CHOLHDL" in the last 168 hours.  Hematology Recent Labs  Lab 07/01/22 0212 07/02/22 0105 07/03/22 0412  WBC 18.0* 13.0* 12.0*  RBC 3.82* 3.58* 3.80*  HGB 10.0* 9.4* 9.9*  HCT 30.9* 29.0* 30.7*  MCV 80.9 81.0 80.8  MCH 26.2 26.3 26.1  MCHC 32.4 32.4 32.2  RDW 15.9* 16.0* 16.0*  PLT 426* 406* 434*   Thyroid  Recent Labs  Lab 06/30/22 1448  TSH 3.665    BNP Recent Labs  Lab 06/30/22 1410  BNP 831.2*    DDimer No results for input(s): "DDIMER" in the last 168 hours.   Radiology    ECHOCARDIOGRAM LIMITED  Result Date: 07/02/2022    ECHOCARDIOGRAM LIMITED REPORT   Patient Name:   ANGELIC SCHNELLE Date of Exam: 07/02/2022 Medical Rec #:  [id number]      Height:       68.0 in Accession #:    [id number]     Weight:       205.9 lb Date of Birth:  86/25/1931       BSA:          2.069 m Patient Age:    88 years       BP:           118/80 mmHg Patient Gender: F              HR:           73 bpm. Exam Location:   Inpatient Procedure: 3D Echo, Cardiac Doppler and Color Doppler Indications:    I31.3 Pericardial effusion (noninflammatory)  History:        Patient has prior history of Echocardiogram examinations, most                 recent 07/01/2022. CHF, Abnormal ECG and Pacemaker,                 Arrythmias:Atrial Fibrillation and Bradycardia,                 Signs/Symptoms:Dyspnea and Syncope; Risk Factors:Dyslipidemia.                 Tamponade.  Sonographer:    Roseanna Rainbow RDCS Referring Phys: [place]  1. Left ventricular ejection fraction, by estimation, is 60 to 65%. The left ventricle has normal function.  2. Right ventricular systolic function is normal. The right ventricular size is normal. There is normal pulmonary artery systolic pressure.  3. Residual predominantly small pericardial effusion; largest along the lateral/posterior wall. No rapid accumulation of fluid. There is no evidence of cardiac tamponade. Large pleural effusion. FINDINGS  Left Ventricle: Left ventricular ejection fraction, by estimation, is 60 to 65%. The left ventricle has normal function. Right Ventricle: The right ventricular size is normal. Right ventricular systolic function is normal. There is normal pulmonary artery systolic pressure. The tricuspid regurgitant velocity is 2.10 m/s, and with an assumed right atrial pressure of 3 mmHg,  the estimated right ventricular systolic pressure is 38.3 mmHg. Pericardium: Residual predominantly small pericardial effusion; largest along the lateral/posterior wall. No rapid accumulation of fluid. There is no evidence of cardiac tamponade. Additional Comments: There is a large pleural effusion. LEFT VENTRICLE PLAX 2D LVIDd:         4.00 cm LVIDs:  2.50 cm LV PW:         1.20 cm LV IVS:        1.70 cm  IVC IVC diam: 2.10 cm LEFT ATRIUM         Index LA diam:    2.90 cm 1.40 cm/m   AORTA Ao Root diam: 3.50 cm TRICUSPID VALVE TR Peak grad:   17.6 mmHg TR Vmax:        210.00  cm/s Phineas Inches Electronically signed by Phineas Inches Signature Date/Time: 07/02/2022/4:47:07 PM    Final    DG ESOPHAGUS W SINGLE CM (SOL OR THIN BA)  Result Date: 07/01/2022 CLINICAL DATA:  Dysphagia, abdominal pain, chest pain, vomiting. EXAM: ESOPHAGUS/BARIUM SWALLOW/TABLET STUDY TECHNIQUE: A single contrast examination was performed using thin liquid barium. Additionally, the patient swallowed a 13 mm barium tablet under fluoroscopy. The exam was performed by Candiss Norse, PA-C, and was supervised and interpreted by Kellie Simmering, D.O. FLUOROSCOPY: Fluoroscopy time: 3 minutes, 30 seconds (22.30 mGy). COMPARISON:  CT abdomen/pelvis 06/30/2022. FINDINGS: Patulous esophagus. No evidence of fixed esophageal stricture, esophageal mass or mucosal abnormality. Moderate-to-severe intermittent esophageal dysmotility with tertiary contractions. Moderate-volume gastroesophageal reflux observed to the level of the mid esophagus. Small to moderate-sized hiatal hernia. The patient swallowed a 13 mm barium tablet, which passed freely into the stomach. IMPRESSION: 1. No evidence of esophageal stricture. 2. Patulous esophagus. 3. Moderate-to-severe intermittent esophageal dysmotility with tertiary contractions. 4. Moderate-volume gastroesophageal reflux observed to the level of the mid esophagus. 5. Small to moderate-sized hiatal hernia. Electronically Signed   By: Kellie Simmering D.O.   On: 07/01/2022 16:06   ECHOCARDIOGRAM LIMITED  Result Date: 07/01/2022    ECHOCARDIOGRAM LIMITED REPORT   Patient Name:   SAMAIYA AWADALLAH Date of Exam: 07/01/2022 Medical Rec #:  [id number]      Height:       68.0 in Accession #:    [id number]     Weight:       207.6 lb Date of Birth:  1930/09/14       BSA:          2.076 m Patient Age:    35 years       BP:           134/106 mmHg Patient Gender: F              HR:           96 bpm. Exam Location:  Inpatient Procedure: Limited Echo and Saline Contrast Bubble Study Indications:    Pericardial  effusion  History:        Patient has prior history of Echocardiogram examinations, most                 recent 06/30/2022.  Sonographer:    Merrie Roof RDCS Referring Phys: [phone] [facility]  Sonographer Comments: 600 ml of blood tinged fluid was drawn out with no complications. IMPRESSIONS  1. Left ventricular ejection fraction, by estimation, is 60 to 65%. The left ventricle has normal function.  2. Difficult to visualize bubble study, no significant interatrial shunt. Conclusion(s)/Recommendation(s): Pre pericardiocentesis shows large pericardial effusion. Post shows resolution of pericardial effusion. FINDINGS  Left Ventricle: Left ventricular ejection fraction, by estimation, is 60 to 65%. The left ventricle has normal function. Pericardium: The pericardial effusion is circumferential. IAS/Shunts: Agitated saline contrast was given intravenously to evaluate for intracardiac shunting. Phineas Inches Electronically signed by Phineas Inches Signature Date/Time: 07/01/2022/12:51:50 PM    Final  CARDIAC CATHETERIZATION  Result Date: 07/01/2022 Successful pericardiocentesis via the subxiphoid area.  I removed 600 mL of bloody fluid.  On echocardiogram, there was resolution of pericardial effusion.  I elected not to keep the pigtail catheter in given that I could not advance the the regular catheter provided with the pericardiocentesis kit due to what seems to be significantly fibrosed pericardium.  I ended up using a short 5 French pigtail catheter which I felt was not a good option to leave in place. Recommendations: Given that the effusion is bloody, differential diagnosis include malignancy or pericarditis with hemorrhagic transformation given that she is on anticoagulation.  I recommend holding Eliquis for at least few weeks. The fluid was sent to the lab for analysis.    Cardiac Studies  ECHO 07/01/2022    1. Left ventricular ejection fraction, by estimation, is 60 to 65%. The  left ventricle has normal  function.   2. Large pericardial effusion. The pericardial effusion is  circumferential. Moderate pleural effusion.   3. The inferior vena cava is dilated in size with <50% respiratory  variability, suggesting right atrial pressure of 15 mmHg.   Conclusion(s)/Recommendation(s): Mild features of cardiac tamponade,  recommend clinical correlation. No significant changes from prior echo.    Successful pericardiocentesis via the subxiphoid area.  I removed 600 mL of bloody fluid.  On echocardiogram, there was resolution of pericardial effusion.  I elected not to keep the pigtail catheter in given that I could not advance the the regular catheter provided with the pericardiocentesis kit due to what seems to be significantly fibrosed pericardium.  I ended up using a short 5 French pigtail catheter which I felt was not a good option to leave in place.   Recommendations: Given that the effusion is bloody, differential diagnosis include malignancy or pericarditis with hemorrhagic transformation given that she is on anticoagulation.  I recommend holding Eliquis for at least few weeks. The fluid was sent to the lab for analysis.     Sonographer Comments: 600 ml of blood tinged fluid was drawn out with no  complications.  IMPRESSIONS     1. Left ventricular ejection fraction, by estimation, is 60 to 65%. The  left ventricle has normal function.   2. Difficult to visualize bubble study, no significant interatrial shunt.   Conclusion(s)/Recommendation(s): Pre pericardiocentesis shows large  pericardial effusion. Post shows resolution of pericardial effusion.   Repeat echo 07/02/2022 shows a small residual effusion.  Patient Profile     88 y.o. female with past medical history of persistent atrial fibrillation, chronic diastolic congestive heart failure, history of sick sinus syndrome status post pacemaker, hypothyroidism admitted with leukocytosis, chest pain and dyspnea and found to have a large  pericardial effusion s/p 600 mL bloody fluid pericardiocentesis  on 07/01/2022  Assessment & Plan    No sign of rapid reaccumulation of pericardial effusion. Etiology unclear, but consider acute inflammatory (viral) pericarditis complicated by hemorrhage due to chronic anticoagulation. Also possible that this is a malignant effusion, but did not anticipate results from cytology for another 2 days, and diagnostic yield likelihood is 30% or less. We will make arrangements for follow-up echo and clinical follow-up [facility] will sign off.   Medication Recommendations: Hold Eliquis until follow-up visit Other recommendations (labs, testing, etc): Echocardiogram in 2 weeks Follow up as an outpatient: Follow-up in clinic with Dr. Harrington Challenger or APP following echocardiogram.  For questions or updates, please contact [facility] Please consult [email or web address] for contact info under  Signed, Sanda Klein, MD  07/03/2022, 10:32 AM

## 2022-07-03 NOTE — Assessment & Plan Note (Signed)
Hypokalemia, hyponatremia.   Renal function with serum cr at 1.32 with K at 3,5 and serum bicarbonate at 28 Continue to hold on diuretic therapy for now. Follow up renal function in am.

## 2022-07-03 NOTE — Assessment & Plan Note (Signed)
Stable, on Fluoxetine

## 2022-07-04 ENCOUNTER — Other Ambulatory Visit (HOSPITAL_COMMUNITY): Payer: Self-pay

## 2022-07-04 DIAGNOSIS — I5033 Acute on chronic diastolic (congestive) heart failure: Secondary | ICD-10-CM | POA: Diagnosis not present

## 2022-07-04 DIAGNOSIS — I300 Acute nonspecific idiopathic pericarditis: Secondary | ICD-10-CM | POA: Diagnosis not present

## 2022-07-04 DIAGNOSIS — I4891 Unspecified atrial fibrillation: Secondary | ICD-10-CM | POA: Diagnosis not present

## 2022-07-04 DIAGNOSIS — E039 Hypothyroidism, unspecified: Secondary | ICD-10-CM | POA: Diagnosis not present

## 2022-07-04 LAB — CYTOLOGY - NON PAP

## 2022-07-04 MED ORDER — TRAMADOL HCL 50 MG PO TABS: 50.0000 mg | ORAL_TABLET | Freq: Every day | ORAL | 0 refills | Status: AC

## 2022-07-04 MED ORDER — PREDNISONE 20 MG PO TABS
20.0000 mg | ORAL_TABLET | Freq: Every day | ORAL | 0 refills | Status: AC
  Filled 2022-07-04: qty 30, 30d supply, fill #0

## 2022-07-04 MED ORDER — COLCHICINE 0.6 MG PO TABS
0.6000 mg | ORAL_TABLET | ORAL | 0 refills | Status: AC
  Filled 2022-07-04: qty 15, 30d supply, fill #0

## 2022-07-04 NOTE — TOC Transition Note (Signed)
Transition of Care [facility]) - CM/SW Discharge Note   Patient Details  Name: Faith Parker MRN: [id number] Date of Birth: 01-Apr-1930  [facility] CM/SW Contact:  Bethann Berkshire, [place] Phone Number: 07/04/2022, 12:53 PM   Clinical Narrative:     CSW called Karlene Einstein with [facility] to inform her of DC. Karlene Einstein explains they would like to have pt admit to their short term rehab. Karlene Einstein will call family to discuss.   Karlene Einstein confirmed with CSW that family and pt agreeable to STR. CSW met with pt and daughter and confirmed as well.   Patient will DC to: [facility] Anticipated DC date: 07/04/22 Family notified: Daughter Museum/gallery exhibitions officer by: Corey Harold   Per MD patient ready for DC to [facility] . RN, patient, patient's family, and facility notified of DC. Discharge Summary and FL2 sent to facility. RN to call report prior to discharge [phone] Room 42B). DC packet on chart. Ambulance transport requested for patient.   CSW will sign off for now as social work intervention is no longer needed. Please consult [place] again if new needs arise.   Final next level of care: Skilled Nursing Facility Barriers to Discharge: No Barriers Identified   Patient Goals and CMS Choice        Discharge Placement              Patient chooses bed at: [facility] Patient to be transferred to facility by: [place] Name of family member notified: Daughter [name] Patient and family notified of of transfer: 07/04/22  Discharge Plan and Services                                     Social Determinants of Health (SDOH) Interventions     Readmission Risk Interventions     No data to display

## 2022-07-04 NOTE — Care Management Important Message (Signed)
Important Message  Patient Details  Name: Faith Parker MRN: [id number] Date of Birth: [birth year]   Medicare Important Message Given:  Yes     Lester, [place] 07/04/2022, 9:55 AM

## 2022-07-04 NOTE — Discharge Summary (Signed)
Physician Discharge Summary   Patient: Faith Parker MRN: [id number] DOB: 03-Jan-1930  Admit date:     06/30/2022  Discharge date: 07/04/22  Discharge Physician: Jimmy Picket Pheonix Wisby   PCP: [name], MD   Recommendations at discharge:    Patient will continue to hold anticoagulation until follow up as outpatient Continue prednisone and colchicine, to taper steroids as outpatient.  Follow up pericardial fluid cytology as outpatient.   Discharge Diagnoses: Principal Problem:   Pericarditis Active Problems:   Acute on chronic diastolic CHF (congestive heart failure) (HCC)   Atrial fibrillation (HCC)   CKD (chronic kidney disease) stage 3, GFR 30-59 ml/min (HCC)   Hyperlipidemia   Hypothyroidism   Leukocytosis  Resolved Problems:   * No resolved hospital problems. *  Hospital Course:  Faith Parker was admitted to the hospital with the working diagnosis of pericardial effusion.  88 y.o. female with medical history significant of chronic A-fib, HTN, chronic HFpEF, hypothyroidism, recurrent UTI on suppression antibiotics, presented to the hospital with  with intermittent chest pain and shortness of breath.  Patient was recently hospitalized for similar problem and was treated for heart failure with fluid overload with aggressive diuresis.  Patient however continued to have intermittent chest pain and shortness of breath with nausea and vomiting. On her initial physical examination her blood pressure was 151/93, HR 119, RR 21 and 02 saturation 93%, lungs with no wheezing or rales, heart with S1 and S2 present irregularly irregular, abdomen not distended and no lower extremity edema.   Na 132, K 3,6 Cl 99 bicarbonate 23, glucose 165 bun 16 cr 1,39  Wbc 18,1 hgb 10.2 plt 383   Chest radiograph with right rotation and cardiomegaly, no infiltrates. Pacemaker in place with one atrial and one ventricular lead.   CT abdomen with very large pericardial effusion (new). Inflammatory  stranding in the aorta hepatis and enhancement along the non dilated extrahepatic biliary tree. Focal hypo-enhancement in the upper medial right kidney renal parenchyma. Multiple cystic lesions in the pancreas. Small moderate bilateral pleural effusions.   EKG 109 bpm, normal axis, qtc 551, atrial fibrillation rhythm, with one pac and one ventricular paced beat, no significant ST segment or T wave changes. Low voltage but not pulsus alternans.   Anticoagulation was held.   Echocardiogram confirmed large pericardial effusion. Clinical concern for impeding tamponade. Patient underwent pericardiocentesis, draining 600 cc of bloody fluid.   Patient was placed on cholchicine and prednisone for acute pericarditis. Follow up echocardiogram with no signs of fluid re accumulation.   Patient will continue to hold on anticoagulation with plan to follow up as outpatient Continue with prednisone and colchicine.   Assessment and Plan: * Pericarditis Pericardial effusion with early tamponade.  07/28 pericardiocentesis 600 cc of bloody fluid removed.  Follow up echocardiogram with no re accumulation of pericardial fluid.  Plan to continue medical therapy with prednisone and colchicine.  Holding non steroidal anti inflammatory agents due to reduced GFR.  Further taper of steroids as outpatient.    Acute on chronic diastolic CHF (congestive heart failure) (HCC) Patient's volume has remained stable, plan to resume diuresis at the time of her discharge.  Resume losartan and antenolo. Plan to follow up as outpatient.   Atrial fibrillation (HCC) Continue rate control with amiodarone and atenolol. Continue to hold on anticoagulation due to bloody nature of pericardial fluid.   CKD (chronic kidney disease) stage 3, GFR 30-59 ml/min (HCC) Hypokalemia, hyponatremia.   Her renal function has remained  stable.  At the time of her discharge her serum cr is 1,32 with K at 3,5 and serum bicarbonate at 28 Na  132.  Resume diuresis at home and follow up renal function as outpatient in 7 days.   Hyperlipidemia Continue with statin therapy.   Hypothyroidism Continue with levothyroxine.   Leukocytosis Reactive leukocytosis.  No clinical sings of bacterial infection.  Wbc is trending down, on admission was predominant neutrophilic.  Plan to follow up cell count as outpatient.   Chronic anemia, follow up hgb has been stable at 9,9          Consultants: cardiology  Procedures performed: pericardiocentesis   Disposition: Home Diet recommendation:  Cardiac diet DISCHARGE MEDICATION: Allergies as of 07/04/2022       Reactions   Donepezil Nausea And Vomiting, Other (See Comments)   Severe vomiting   Ciprofloxacin Other (See Comments)   Not listed on MAR   Latex Other (See Comments)   Not listed on MAR   Levofloxacin Other (See Comments)   Not listed on MAR   Macrobid [nitrofurantoin Monohyd Macro] Other (See Comments)   Not listed on MAR   Nitrofurantoin Other (See Comments)   Not listed on MAR   Sulfa Antibiotics Other (See Comments)   Not listed on MAR   Sulfamethoxazole    Not listed on MAR        Medication List     STOP taking these medications    apixaban 5 MG Tabs tablet Commonly known as: ELIQUIS       TAKE these medications    amiodarone 200 MG tablet Commonly known as: PACERONE Take 1 tablet (200 mg total) by mouth 2 (two) times daily.   atenolol 25 MG tablet Commonly known as: TENORMIN Take 25 mg by mouth daily.   cephALEXin 250 MG capsule Commonly known as: KEFLEX Take 250 mg by mouth daily.   colchicine 0.6 MG tablet Take 1 tablet (0.6 mg total) by mouth every other day.   FLUoxetine 20 MG capsule Commonly known as: PROZAC Take 20 mg by mouth daily.   FLUoxetine 40 MG capsule Commonly known as: PROZAC Take 40 mg by mouth in the morning.   furosemide 20 MG tablet Commonly known as: LASIX Take 1 tablet (20 mg total) by mouth daily.  Take 20 mg by mouth on Monday, Tuesday, Wednesday, Thursday, Friday, and Saturday   levothyroxine 88 MCG tablet Commonly known as: SYNTHROID Take 88 mcg by mouth daily.   losartan 25 MG tablet Commonly known as: COZAAR Take 37.5 mg by mouth daily.   magnesium hydroxide 400 MG/5ML suspension Commonly known as: MILK OF MAGNESIA Take 30 mLs by mouth daily as needed for mild constipation. If no BM in 24 hours, check for hard stool in rectum. Remove digitally if present. Fleet's enema per rectum x1 dose after removing hard stool from rectum. DO NOT USE FOR CHRONIC KIDNEY DISEASE   nystatin powder Generic drug: nystatin Apply 1 Application topically 2 (two) times daily. For 3 weeks   Omega-3 Fish Oil 1200 MG Caps Take 2,400 mg by mouth daily.   omeprazole 20 MG tablet Commonly known as: PRILOSEC OTC Take 20 mg by mouth daily.   predniSONE 20 MG tablet Commonly known as: DELTASONE Take 1 tablet (20 mg total) by mouth daily with breakfast. Start taking on: July 05, 2022   rosuvastatin 20 MG tablet Commonly known as: CRESTOR TAKE ONE TABLET EVERY EVENING   Systane 0.4-0.3 % Gel ophthalmic gel Generic  drug: Polyethyl Glycol-Propyl Glycol Place 1 application into both eyes at bedtime.   traMADol 50 MG tablet Commonly known as: ULTRAM Take 1 tablet (50 mg total) by mouth daily.   Vitamin D3 125 MCG (5000 UT) Tabs Take 5,000 Units by mouth daily.        Follow-up Information     Swinyer, Lanice Schwab, NP Follow up.   Specialty: Nurse Practitioner Why: The office will contact you about arranging a follow-up echocardiogram in 2 weeks. If you do not hear from them in 2-3 days following hospital discharge, please call the number provided. Follow-up to review results on 8/25 at 11:20 AM. Contact information: [address] [place] [address] [phone]                Discharge Exam: Danley Danker Weights   07/02/22 0534 07/03/22 0353 07/04/22 0500  Weight: 93.4  kg 92.2 kg 90.6 kg   BP (!) 147/91 (BP Location: Left Arm)   Pulse 80   Temp 97.6 F (36.4 C) (Oral)   Resp 18   Ht 5' 10"  (1.778 m)   Wt 90.6 kg   SpO2 95%   BMI 30.37 kg/m   Patient is feeling better, no dyspnea or chest pain  Neurology awake and alert ENT with mild pallor Cardiovascular with S1 and S2 present, irregularly irregular.  Respiratory with no rales or rhonchi, no wheezing Abdomen not distended No lower extremity edema   Condition at discharge: stable  The results of significant diagnostics from this hospitalization (including imaging, microbiology, ancillary and laboratory) are listed below for reference.   Imaging Studies: ECHOCARDIOGRAM LIMITED  Result Date: 07/02/2022    ECHOCARDIOGRAM LIMITED REPORT   Patient Name:   LURETHA EBERLY Date of Exam: 07/02/2022 Medical Rec #:  [id number]      Height:       68.0 in Accession #:    [id number]     Weight:       205.9 lb Date of Birth:  1930-10-13       BSA:          2.069 m Patient Age:    24 years       BP:           118/80 mmHg Patient Gender: F              HR:           73 bpm. Exam Location:  Inpatient Procedure: 3D Echo, Cardiac Doppler and Color Doppler Indications:    I31.3 Pericardial effusion (noninflammatory)  History:        Patient has prior history of Echocardiogram examinations, most                 recent 07/01/2022. CHF, Abnormal ECG and Pacemaker,                 Arrythmias:Atrial Fibrillation and Bradycardia,                 Signs/Symptoms:Dyspnea and Syncope; Risk Factors:Dyslipidemia.                 Tamponade.  Sonographer:    Roseanna Rainbow RDCS Referring Phys: Experiment  1. Left ventricular ejection fraction, by estimation, is 60 to 65%. The left ventricle has normal function.  2. Right ventricular systolic function is normal. The right ventricular size is normal. There is normal pulmonary artery systolic pressure.  3. Residual predominantly small pericardial effusion; largest along  the  lateral/posterior wall. No rapid accumulation of fluid. There is no evidence of cardiac tamponade. Large pleural effusion. FINDINGS  Left Ventricle: Left ventricular ejection fraction, by estimation, is 60 to 65%. The left ventricle has normal function. Right Ventricle: The right ventricular size is normal. Right ventricular systolic function is normal. There is normal pulmonary artery systolic pressure. The tricuspid regurgitant velocity is 2.10 m/s, and with an assumed right atrial pressure of 3 mmHg,  the estimated right ventricular systolic pressure is 49.2 mmHg. Pericardium: Residual predominantly small pericardial effusion; largest along the lateral/posterior wall. No rapid accumulation of fluid. There is no evidence of cardiac tamponade. Additional Comments: There is a large pleural effusion. LEFT VENTRICLE PLAX 2D LVIDd:         4.00 cm LVIDs:         2.50 cm LV PW:         1.20 cm LV IVS:        1.70 cm  IVC IVC diam: 2.10 cm LEFT ATRIUM         Index LA diam:    2.90 cm 1.40 cm/m   AORTA Ao Root diam: 3.50 cm TRICUSPID VALVE TR Peak grad:   17.6 mmHg TR Vmax:        210.00 cm/s Phineas Inches Electronically signed by Phineas Inches Signature Date/Time: 07/02/2022/4:47:07 PM    Final    DG ESOPHAGUS W SINGLE CM (SOL OR THIN BA)  Result Date: 07/01/2022 CLINICAL DATA:  Dysphagia, abdominal pain, chest pain, vomiting. EXAM: ESOPHAGUS/BARIUM SWALLOW/TABLET STUDY TECHNIQUE: A single contrast examination was performed using thin liquid barium. Additionally, the patient swallowed a 13 mm barium tablet under fluoroscopy. The exam was performed by Candiss Norse, PA-C, and was supervised and interpreted by Kellie Simmering, D.O. FLUOROSCOPY: Fluoroscopy time: 3 minutes, 30 seconds (22.30 mGy). COMPARISON:  CT abdomen/pelvis 06/30/2022. FINDINGS: Patulous esophagus. No evidence of fixed esophageal stricture, esophageal mass or mucosal abnormality. Moderate-to-severe intermittent esophageal dysmotility with tertiary  contractions. Moderate-volume gastroesophageal reflux observed to the level of the mid esophagus. Small to moderate-sized hiatal hernia. The patient swallowed a 13 mm barium tablet, which passed freely into the stomach. IMPRESSION: 1. No evidence of esophageal stricture. 2. Patulous esophagus. 3. Moderate-to-severe intermittent esophageal dysmotility with tertiary contractions. 4. Moderate-volume gastroesophageal reflux observed to the level of the mid esophagus. 5. Small to moderate-sized hiatal hernia. Electronically Signed   By: Kellie Simmering D.O.   On: 07/01/2022 16:06   ECHOCARDIOGRAM LIMITED  Result Date: 07/01/2022    ECHOCARDIOGRAM LIMITED REPORT   Patient Name:   SAACHI ZALE Date of Exam: 07/01/2022 Medical Rec #:  [id number]      Height:       68.0 in Accession #:    [id number]     Weight:       207.6 lb Date of Birth:  14-Jun-1930       BSA:          2.076 m Patient Age:    62 years       BP:           134/106 mmHg Patient Gender: F              HR:           96 bpm. Exam Location:  Inpatient Procedure: Limited Echo and Saline Contrast Bubble Study Indications:    Pericardial effusion  History:        Patient has prior history of Echocardiogram examinations, most  recent 06/30/2022.  Sonographer:    Merrie Roof RDCS Referring Phys: [phone] [facility] AR[facility]  Sonographer Comments: 600 ml of blood tinged fluid was drawn out with no complications. IMPRESSIONS  1. Left ventricular ejection fraction, by estimation, is 60 to 65%. The left ventricle has normal function.  2. Difficult to visualize bubble study, no significant interatrial shunt. Conclusion(s)/Recommendation(s): Pre pericardiocentesis shows large pericardial effusion. Post shows resolution of pericardial effusion. FINDINGS  Left Ventricle: Left ventricular ejection fraction, by estimation, is 60 to 65%. The left ventricle has normal function. Pericardium: The pericardial effusion is circumferential. IAS/Shunts: Agitated saline contrast  was given intravenously to evaluate for intracardiac shunting. Phineas Inches Electronically signed by Phineas Inches Signature Date/Time: 07/01/2022/12:51:50 PM    Final    CARDIAC CATHETERIZATION  Result Date: 07/01/2022 Successful pericardiocentesis via the subxiphoid area.  I removed 600 mL of bloody fluid.  On echocardiogram, there was resolution of pericardial effusion.  I elected not to keep the pigtail catheter in given that I could not advance the the regular catheter provided with the pericardiocentesis kit due to what seems to be significantly fibrosed pericardium.  I ended up using a short 5 French pigtail catheter which I felt was not a good option to leave in place. Recommendations: Given that the effusion is bloody, differential diagnosis include malignancy or pericarditis with hemorrhagic transformation given that she is on anticoagulation.  I recommend holding Eliquis for at least few weeks. The fluid was sent to the lab for analysis.   ECHOCARDIOGRAM LIMITED  Result Date: 07/01/2022    ECHOCARDIOGRAM LIMITED REPORT   Patient Name:   MICKEY HEBEL Date of Exam: 07/01/2022 Medical Rec #:  [id number]      Height:       70.0 in Accession #:    [id number]     Weight:       200.8 lb Date of Birth:  [birth year]       BSA:          2.091 m Patient Age:    58 years       BP:           158/97 mmHg Patient Gender: F              HR:           111 bpm. Exam Location:  Inpatient Procedure: Limited Echo, Limited Color Doppler and Cardiac Doppler Indications:    pericardial effusion  History:        Patient has prior history of Echocardiogram examinations, most                 recent 06/30/2022. Pericarditis, chronic kidney disease,                 Arrythmias:Atrial Fibrillation; Risk Factors:Hypertension and                 Dyslipidemia.  Sonographer:    Johny Chess RDCS Referring Phys: Cassie Freer O'NEAL IMPRESSIONS  1. Left ventricular ejection fraction, by estimation, is 60 to 65%. The left ventricle has  normal function.  2. Large pericardial effusion. The pericardial effusion is circumferential. Moderate pleural effusion.  3. The inferior vena cava is dilated in size with <50% respiratory variability, suggesting right atrial pressure of 15 mmHg. Conclusion(s)/Recommendation(s): Mild features of cardiac tamponade, recommend clinical correlation. No significant changes from prior echo. FINDINGS  Left Ventricle: Left ventricular ejection fraction, by estimation, is 60 to 65%. The left ventricle has normal function. Pericardium:  A large pericardial effusion is present. The pericardial effusion is circumferential. Venous: The inferior vena cava is dilated in size with less than 50% respiratory variability, suggesting right atrial pressure of 15 mmHg. Additional Comments: There is a moderate pleural effusion. IVC IVC diam: 2.70 cm Phineas Inches Electronically signed by Phineas Inches Signature Date/Time: 07/01/2022/10:21:29 AM    Final    ECHOCARDIOGRAM LIMITED  Result Date: 06/30/2022    ECHOCARDIOGRAM LIMITED REPORT   Patient Name:   YERALDIN LITZENBERGER Date of Exam: 06/30/2022 Medical Rec #:  [id number]      Height:       70.0 in Accession #:    [id number]     Weight:       200.0 lb Date of Birth:  [birth year]       BSA:          2.087 m Patient Age:    36 years       BP:           114/81 mmHg Patient Gender: F              HR:           101 bpm. Exam Location:  Inpatient Procedure: Limited Echo                       STAT ECHO Reported to: Dr Loralie Champagne on 06/30/2022 12:00:00 AM          Dr. Lake Bells O'Neal bedside for echo. Indications:    Pericardial effusion  History:        Patient has prior history of Echocardiogram examinations, most                 recent 06/23/2022.  Sonographer:    Clayton Lefort RDCS (AE) Referring Phys: [phone] HAO MENG IMPRESSIONS  1. Large pericardial effusion. The pericardial effusion is circumferential. The IVC is dilated though the RV does not appear to show diastolic collapse. No respirometry. I am  concerned for early tamponade.  2. Left ventricular ejection fraction, by estimation, is 60 to 65%. The left ventricle has normal function. The left ventricle has no regional wall motion abnormalities. Left ventricular diastolic parameters are indeterminate.  3. Right ventricular systolic function is normal. The right ventricular size is normal.  4. The aortic valve is tricuspid. There is severe calcifcation of the aortic valve. not fully evaluated.  5. The inferior vena cava is dilated in size with <50% respiratory variability, suggesting right atrial pressure of 15 mmHg.  6. The patient was in atrial fibrillation. FINDINGS  Left Ventricle: Left ventricular ejection fraction, by estimation, is 60 to 65%. The left ventricle has normal function. The left ventricle has no regional wall motion abnormalities. Left ventricular diastolic parameters are indeterminate. Right Ventricle: The right ventricular size is normal. No increase in right ventricular wall thickness. Right ventricular systolic function is normal. Pericardium: A large pericardial effusion is present. The pericardial effusion is circumferential. Aortic Valve: The aortic valve is tricuspid. There is severe calcifcation of the aortic valve. Not fully evaluated. Venous: The inferior vena cava is dilated in size with less than 50% respiratory variability, suggesting right atrial pressure of 15 mmHg. IVC IVC diam: 3.00 cm Dalton McleanMD Electronically signed by Franki Monte Signature Date/Time: 06/30/2022/3:08:41 PM    Final    CT Abdomen Pelvis W Contrast  Result Date: 06/30/2022 CLINICAL DATA:  Nausea and emesis. Abdominal distension. Pallor. Abdominal pain. EXAM: CT ABDOMEN AND PELVIS WITH  CONTRAST TECHNIQUE: Multidetector CT imaging of the abdomen and pelvis was performed using the standard protocol following bolus administration of intravenous contrast. RADIATION DOSE REDUCTION: This exam was performed according to the departmental  dose-optimization program which includes automated exposure control, adjustment of the mA and/or kV according to patient size and/or use of iterative reconstruction technique. CONTRAST:  145m OMNIPAQUE IOHEXOL 350 MG/ML SOLN COMPARISON:  Multiple exams, including CT chest 10/16/2019 FINDINGS: Lower chest: Large pericardial effusion, not present on 10/16/2019. Pacer leads noted. Mild to moderate cardiomegaly. Aortic valve calcification. Thoracic aortic atherosclerotic calcification. Small to moderate hiatal hernia. Fluid distention of the distal esophagus. Small to moderate bilateral pleural effusions with passive atelectasis. Mild scarring or atelectasis in the right middle lobe and lingula. Hepatobiliary: Accentuated density in the gallbladder, probably from sludge. Mildly accentuated enhancement in the wall of the common hepatic duct and common bile duct. No intrahepatic biliary dilatation. Indistinct stranding in the porta hepatis. Pancreas: Hypodense pancreatic tail lesion, 2.4 by 1.6 cm on image 36 series 3, internal density 9 Hounsfield units. Fluid density lesion along the inferior margin of the pancreatic body, 3.0 by 1.6 cm on image 39 series 3, internal density 2 Hounsfield units. Questionable prominence of the dorsal and ventral pancreatic duct in the pancreatic head. Possible uncinate process cystic lesion, 1.6 by 0.8 cm on image 40 series 3. Spleen: Unremarkable Adrenals/Urinary Tract: Multiple bilateral moderate to large sized benign renal cysts. 1.8 by 1.4 cm focus of hypoenhancement in the renal parenchyma adjacent to the posterior renal cyst of the left kidney upper pole on image 14 series 8 and image 39 series 3. Urinary bladder unremarkable. The adrenal glands appear normal. Stomach/Bowel: Sigmoid colon diverticulosis, no compelling findings of active diverticulitis. Vascular/Lymphatic: Atherosclerosis is present, including aortoiliac atherosclerotic disease. There is atheromatous plaque  proximally in the celiac trunk and SMA without overt occlusion. Reproductive: Unremarkable Other: Low-level edema along the root of the mesentery. Infiltrative edema in the porta hepatis. Musculoskeletal: Prominent degenerative hip arthropathy bilaterally. Bony demineralization. Left sacral nerve stimulator device. Old healed right medial eleventh rib fracture. Dextroconvex lumbar scoliosis. Mild rectus diastasis and anterior abdominal wall laxity. Lumbar spondylosis and degenerative disc disease contribute to multilevel foraminal stenosis and central narrowing of the thecal sac IMPRESSION: 1. Very large pericardial effusion, not present on 10/16/2019. Correlate with physical exam findings such as Becks triad in assessing for signs of tamponade, echocardiography may be warranted in further workup. 2. Inflammatory stranding in the porta hepatis some accentuated enhancement along the non dilated extrahepatic biliary tree. Cannot exclude cholangitis. 3. Focal hypoenhancement in the upper medial right kidney renal parenchyma, potentially from pyelonephritis or infiltrative malignancy. Correlate with urine analysis in assessing for signs of pyelonephritis. 4. Multiple hypodense, probably cystic lesions of the pancreas. The largest measures 3.0 cm in long axis. In light of the size the lesions and the patient's age, follow up pancreatic protocol CT or MRI is recommended in 2 years time. The patient does have a pacer and a sacral nerve stimulator which may preclude MRI. This recommendation follows ACR consensus guidelines: Management of Incidental Pancreatic Cysts: A White Paper of the ACR Incidental Findings Committee. [id number] 5. Small to moderate bilateral pleural effusions with passive atelectasis. 6. Other imaging findings of potential clinical significance: Aortic valve calcification. Aortic Atherosclerosis (ICD10-I70.0). Small to moderate hiatal hernia. Multiple bilateral moderate to large  sized renal cysts. Sigmoid colon diverticulosis. Systemic atherosclerosis. Prominent degenerative hip arthropathy bilaterally. Dextroconvex lumbar scoliosis. Mild rectus diastasis.  Bony demineralization. Multilevel lumbar impingement due to spondylosis and degenerative disc disease. Electronically Signed   By: Van Clines M.D.   On: 06/30/2022 13:07   DG Abd Acute W/Chest  Result Date: 06/30/2022 CLINICAL DATA:  Emesis EXAM: DG ABDOMEN ACUTE WITH 1 VIEW CHEST COMPARISON:  Radiographs 06/22/2022 FINDINGS: Mild gaseous distention of the cecum/right colon at the upper limits of normal. No dilation of the remaining colon or small. No radiopaque calculi or other significant radiographic abnormality is seen. Heart size and mediastinal contours are within normal limits. Sacral stimulator. Enlarged cardiomediastinal silhouette. Atelectasis/consolidation in the left lower lung. Scattered areas of linear atelectasis/scarring. Possible small left pleural effusion. Aortic atherosclerotic calcification. Advanced thoracolumbar spondylosis. IMPRESSION: 1. Negative abdominal radiographs. 2. Presumed atelectasis in the left lower lung though pneumonia is difficult to exclude. Electronically Signed   By: Placido Sou M.D.   On: 06/30/2022 11:25   ECHOCARDIOGRAM COMPLETE  Result Date: 06/23/2022    ECHOCARDIOGRAM REPORT   Patient Name:   VALKYRIE GUARDIOLA Date of Exam: 06/23/2022 Medical Rec #:  [id number]      Height:       68.0 in Accession #:    [id number]     Weight:       196.2 lb Date of Birth:  1930/04/16       BSA:          2.027 m Patient Age:    35 years       BP:           111/70 mmHg Patient Gender: F              HR:           81 bpm. Exam Location:  Inpatient Procedure: 2D Echo, Cardiac Doppler and Color Doppler Indications:    Acute respiratory distress  History:        Patient has prior history of Echocardiogram examinations, most                 recent 01/13/2022. Pacemaker, Arrythmias:Atrial Fibrillation;  Risk                 Factors:Hypertension.  Sonographer:    Joette Catching RCS Referring Phys: [place]  1. Moderate hypertrophy of the basal septum with otherwise mild LVH. Left ventricular ejection fraction, by estimation, is 60 to 65%. The left ventricle has normal function. The left ventricle has no regional wall motion abnormalities. There is moderate  asymmetric left ventricular hypertrophy. Left ventricular diastolic parameters are indeterminate. Elevated left ventricular end-diastolic pressure.  2. Right ventricular systolic function is normal. The right ventricular size is normal. There is mildly elevated pulmonary artery systolic pressure.  3. Left atrial size was severely dilated.  4. A small pericardial effusion is present. The pericardial effusion is posterior and lateral to the left ventricle. There is no evidence of cardiac tamponade. Moderate pleural effusion in the left lateral region.  5. The mitral valve is normal in structure. Trivial mitral valve regurgitation. No evidence of mitral stenosis.  6. The aortic valve is tricuspid. There is moderate calcification of the aortic valve. There is moderate thickening of the aortic valve. Aortic valve regurgitation is mild. Moderate aortic valve stenosis. Aortic valve area, by VTI measures 1.95 cm. Aortic valve mean gradient measures 22.0 mmHg. Aortic valve Vmax measures 2.93 m/s.  7. Aortic dilatation noted. There is mild dilatation of the aortic root, measuring 37 mm. There is moderate dilatation of the ascending aorta,  measuring 46 mm.  8. The inferior vena cava is dilated in size with >50% respiratory variability, suggesting right atrial pressure of 8 mmHg. FINDINGS  Left Ventricle: Moderate hypertrophy of the basal septum with otherwise mild LVH. Left ventricular ejection fraction, by estimation, is 60 to 65%. The left ventricle has normal function. The left ventricle has no regional wall motion abnormalities. The left  ventricular internal cavity size was normal in size. There is moderate asymmetric left ventricular hypertrophy. Left ventricular diastolic function could not be evaluated due to atrial fibrillation. Left ventricular diastolic parameters are indeterminate. Elevated left ventricular end-diastolic pressure. Right Ventricle: The right ventricular size is normal. No increase in right ventricular wall thickness. Right ventricular systolic function is normal. There is mildly elevated pulmonary artery systolic pressure. The tricuspid regurgitant velocity is 2.98  m/s, and with an assumed right atrial pressure of 8 mmHg, the estimated right ventricular systolic pressure is 25.6 mmHg. Left Atrium: Left atrial size was severely dilated. Right Atrium: Right atrial size was normal in size. Pericardium: A small pericardial effusion is present. The pericardial effusion is posterior and lateral to the left ventricle. There is no evidence of cardiac tamponade. Mitral Valve: The mitral valve is normal in structure. Trivial mitral valve regurgitation. No evidence of mitral valve stenosis. Tricuspid Valve: The tricuspid valve is normal in structure. Tricuspid valve regurgitation is mild . No evidence of tricuspid stenosis. Aortic Valve: The aortic valve is tricuspid. There is moderate calcification of the aortic valve. There is moderate thickening of the aortic valve. Aortic valve regurgitation is mild. Aortic regurgitation PHT measures 705 msec. Moderate aortic stenosis is present. Aortic valve mean gradient measures 22.0 mmHg. Aortic valve peak gradient measures 34.3 mmHg. Aortic valve area, by VTI measures 1.95 cm. Pulmonic Valve: The pulmonic valve was normal in structure. Pulmonic valve regurgitation is trivial. No evidence of pulmonic stenosis. Aorta: Aortic dilatation noted. There is mild dilatation of the aortic root, measuring 37 mm. There is moderate dilatation of the ascending aorta, measuring 46 mm. Venous: The inferior  vena cava is dilated in size with greater than 50% respiratory variability, suggesting right atrial pressure of 8 mmHg. IAS/Shunts: No atrial level shunt detected by color flow Doppler. Additional Comments: There is a moderate pleural effusion in the left lateral region.  LEFT VENTRICLE PLAX 2D LVIDd:         3.95 cm   Diastology LVIDs:         2.25 cm   LV e' medial:    5.15 cm/s LV PW:         1.15 cm   LV E/e' medial:  24.3 LV IVS:        1.35 cm   LV e' lateral:   10.30 cm/s LVOT diam:     2.30 cm   LV E/e' lateral: 12.1 LV SV:         107 LV SV Index:   53 LVOT Area:     4.15 cm  RIGHT VENTRICLE            IVC RV Basal diam:  4.10 cm    IVC diam: 2.90 cm RV Mid diam:    2.30 cm RV S prime:     8.93 cm/s TAPSE (M-mode): 1.3 cm LEFT ATRIUM             Index        RIGHT ATRIUM           Index LA diam:  4.00 cm 1.97 cm/m   RA Area:     21.00 cm LA Vol (A2C):   74.1 ml 36.55 ml/m  RA Volume:   49.80 ml  24.57 ml/m LA Vol (A4C):   81.4 ml 40.15 ml/m LA Biplane Vol: 80.9 ml 39.91 ml/m  AORTIC VALVE AV Area (Vmax):    1.74 cm AV Area (Vmean):   1.83 cm AV Area (VTI):     1.95 cm AV Vmax:           293.00 cm/s AV Vmean:          205.500 cm/s AV VTI:            0.548 m AV Peak Grad:      34.3 mmHg AV Mean Grad:      22.0 mmHg LVOT Vmax:         122.50 cm/s LVOT Vmean:        90.300 cm/s LVOT VTI:          0.258 m LVOT/AV VTI ratio: 0.47 AI PHT:            705 msec  AORTA Ao Root diam: 3.65 cm Ao Asc diam:  4.60 cm MITRAL VALVE                TRICUSPID VALVE MV Area (PHT): 3.76 cm     TV Peak grad:   30.2 mmHg MV Decel Time: 202 msec     TV Vmax:        2.75 m/s MV E velocity: 125.00 cm/s  TR Peak grad:   35.5 mmHg MV A velocity: 31.50 cm/s   TR Vmax:        298.00 cm/s MV E/A ratio:  3.97                             SHUNTS                             Systemic VTI:  0.26 m                             Systemic Diam: 2.30 cm Skeet Latch MD Electronically signed by Skeet Latch MD Signature  Date/Time: 06/23/2022/4:05:09 PM    Final    DG Chest Portable 1 View  Result Date: 06/22/2022 CLINICAL DATA:  Chest pain. EXAM: PORTABLE CHEST 1 VIEW COMPARISON:  12/08/2018. FINDINGS: Heart is enlarged and the mediastinal contour is stable. Atherosclerotic calcification of the aorta is noted. A dual lead pacemaker is present over the left chest. Increased atelectasis or infiltrate is noted at the lung bases, greater on the left than on the right. There is a small left pleural effusion. No pneumothorax. No acute osseous abnormality. IMPRESSION: 1. Increased atelectasis or infiltrate at the lung bases, greater on the left than on the right. 2. Small left pleural effusion. Electronically Signed   By: Brett Fairy M.D.   On: 06/22/2022 04:50   CUP PACEART REMOTE DEVICE CHECK  Result Date: 06/11/2022 Scheduled remote reviewed. Normal device function.  AF burden 45%, becoming more persistent per trends. On [place] Next remote 91 days.   Microbiology: Results for orders placed or performed during the hospital encounter of 06/30/22  Culture, body fluid w Gram Stain-bottle     Status: None (Preliminary result)   Collection Time: 07/01/22 12:13 PM  Specimen: Pericardial  Result Value Ref Range Status   Specimen Description PERICARDIAL  Final   Special Requests NONE  Final   Culture   Final    NO GROWTH 2 DAYS Performed at [facility], [address], [place], [place] [address]    Report Status PENDING  Incomplete  Gram stain     Status: None   Collection Time: 07/01/22 12:13 PM   Specimen: Pericardial  Result Value Ref Range Status   Specimen Description PERICARDIAL  Final   Special Requests NONE  Final   Gram Stain   Final    ABUNDANT WBC PRESENT,BOTH PMN AND MONONUCLEAR NO ORGANISMS SEEN Performed at [facility], [address] [place], [place] [address]    Report Status 07/01/2022 FINAL  Final    Labs: CBC: Recent Labs  Lab 06/30/22 1049 07/01/22 0212 07/02/22 0105  07/03/22 0412  WBC 18.1* 18.0* 13.0* 12.0*  NEUTROABS 16.0*  --   --   --   HGB 10.2* 10.0* 9.4* 9.9*  HCT 31.3* 30.9* 29.0* 30.7*  MCV 82.4 80.9 81.0 80.8  PLT 383 426* 406* 026*   Basic Metabolic Panel: Recent Labs  Lab 06/30/22 1049 07/01/22 0212 07/02/22 0105 07/03/22 0412  NA 132* 132* 134* 132*  K 3.6 3.3* 3.7 3.5  CL 99 98 98 95*  CO2 23 24 28 28   GLUCOSE 165* 155* 114* 96  BUN 16 16 19  24*  CREATININE 1.39* 1.37* 1.31* 1.32*  CALCIUM 8.6* 8.7* 8.4* 8.3*  MG  --   --  1.9 2.1   Liver Function Tests: Recent Labs  Lab 06/30/22 1049 07/01/22 0212  AST 48* 38  ALT 50* 48*  ALKPHOS 88 88  BILITOT 0.8 0.6  PROT 6.1* 6.3*  ALBUMIN 3.0* 3.1*   CBG: No results for input(s): "GLUCAP" in the last 168 hours.  Discharge time spent: greater than 30 minutes.  Signed: Tawni Millers, MD [facility] 07/04/2022

## 2022-07-04 NOTE — Evaluation (Signed)
Occupational Therapy Evaluation Patient Details Name: Faith Parker MRN: [id number] DOB: 06/19/1930 Today's Date: 07/04/2022   History of Present Illness Pt is a 88 y/o female admitted secondary to increased chest pain. Pt found to have pericarditis, underwent pericardiocentesis on 7/28. PMH includes a fib, HTN, and CKD.   Clinical Impression   Pt from [facility] and plan is to return. Able to ambulate short distance with S @ rollator level. Verbal cues for safety. Daughter present during session.  Recommend assistance with all ADL initially by PCA and use of 3in1 by bed or chair at night to reduce risk of falls. Recommend follow up with [facility]. Discussed with CM. All further needs can be addressed by [facility]. Recommend pt ambulate with staff and mobility specialists. VSS.     Recommendations for follow up therapy are one component of a multi-disciplinary discharge planning process, led by the attending physician.  Recommendations may be updated based on patient status, additional functional criteria and insurance authorization.   Follow Up Recommendations  Home health OT    Assistance Recommended at Discharge Frequent or constant Supervision/Assistance  Patient can return home with the following A little help with walking and/or transfers;A little help with bathing/dressing/bathroom;Assistance with cooking/housework;Direct supervision/assist for medications management;Assist for transportation;Help with stairs or ramp for entrance    Functional Status Assessment  Patient has had a recent decline in their functional status and demonstrates the ability to make significant improvements in function in a reasonable and predictable amount of time.  Equipment Recommendations  BSC/3in1    Recommendations for Other Services       Precautions / Restrictions Precautions Precautions: Fall Restrictions Weight Bearing Restrictions: No      Mobility Bed Mobility Overal bed mobility:  Modified Independent                  Transfers Overall transfer level: Needs assistance Equipment used: Rolling walker (2 wheels) Transfers: Sit to/from Stand Sit to Stand: Min guard           General transfer comment: verbal cues for increased trunk flexion and hand placement      Balance Overall balance assessment: Needs assistance Sitting-balance support: No upper extremity supported, Feet supported Sitting balance-Leahy Scale: Good     Standing balance support: Single extremity supported, Bilateral upper extremity supported, Reliant on assistive device for balance Standing balance-Leahy Scale: Poor Standing balance comment: Reliant on BUE support                           ADL either performed or assessed with clinical judgement   ADL Overall ADL's : Needs assistance/impaired     Grooming: Set up   Upper Body Bathing: Set up;Sitting   Lower Body Bathing: Minimal assistance;Sit to/from stand   Upper Body Dressing : Set up;Sitting   Lower Body Dressing: Moderate assistance   Toilet Transfer: Rolling walker (2 wheels);Supervision/safety   Toileting- Water quality scientist and Hygiene: Maximal assistance Toileting - Clothing Manipulation Details (indicate cue type and reason): urinary incontinence at baseline; wears depenfds     Functional mobility during ADLs: Supervision/safety;Rollator (4 wheels)       Vision Baseline Vision/History: 1 Wears glasses       Perception     Praxis      Pertinent Vitals/Pain Pain Assessment Pain Assessment: No/denies pain     Hand Dominance Right   Extremity/Trunk Assessment Upper Extremity Assessment Upper Extremity Assessment: Generalized weakness   Lower Extremity  Assessment Lower Extremity Assessment: Defer to PT evaluation   Cervical / Trunk Assessment Cervical / Trunk Assessment: Kyphotic   Communication Communication Communication: No difficulties   Cognition Arousal/Alertness:  Awake/alert Behavior During Therapy: WFL for tasks assessed/performed Overall Cognitive Status: History of cognitive impairments - at baseline                                 General Comments: slowed processing (STM deficits; per daughter at baseline)     General Comments  VSS RA    Exercises     Shoulder Instructions      Home Living Family/patient expects to be discharged to:: Assisted living Living Arrangements: Alone                           Home Equipment: Rollator (4 wheels)   Additional Comments: From [facility]      Prior Functioning/Environment Prior Level of Function : Needs assist             Mobility Comments: pt furniture surfs within apartment or uses a rollator, utilizes a rollator outside of her apartment ADLs Comments: Staff assists with ADLs.        OT Problem List: Decreased strength;Decreased activity tolerance;Impaired balance (sitting and/or standing);Decreased safety awareness;Decreased knowledge of use of DME or AE;Cardiopulmonary status limiting activity      OT Treatment/Interventions:      OT Goals(Current goals can be found in the care plan section) Acute Rehab OT Goals Patient Stated Goal: to be safe at  home OT Goal Formulation: All assessment and education complete, DC therapy  OT Frequency:      Co-evaluation              AM-PAC OT "6 Clicks" Daily Activity     Outcome Measure Help from another person eating meals?: None Help from another person taking care of personal grooming?: A Little Help from another person toileting, which includes using toliet, bedpan, or urinal?: A Lot Help from another person bathing (including washing, rinsing, drying)?: A Little Help from another person to put on and taking off regular upper body clothing?: A Little Help from another person to put on and taking off regular lower body clothing?: A Lot 6 Click Score: 17   End of Session Equipment Utilized  During Treatment: Gait belt;Rollator (4 wheels) Nurse Communication: Mobility status;Other (comment) (DC needs)  Activity Tolerance: Patient tolerated treatment well Patient left: in chair;with call bell/phone within reach;with family/visitor present  OT Visit Diagnosis: Unsteadiness on feet (R26.81);Other abnormalities of gait and mobility (R26.89);Muscle weakness (generalized) (M62.81)                Time: 8502-7741 OT Time Calculation (min): 29 min Charges:  OT General Charges $OT Visit: 1 Visit OT Evaluation $OT Eval Low Complexity: 1 Low OT Treatments $Self Care/Home Management : 8-22 mins  Maurie Boettcher, OT/L   Acute OT Clinical Specialist Acute Rehabilitation Services Pager [phone] Office [phone]   [facility] 07/04/2022, 10:21 AM

## 2022-07-04 NOTE — NC FL2 (Signed)
[place] MEDICAID FL2 LEVEL OF CARE SCREENING TOOL     IDENTIFICATION  Patient Name: Faith Parker Birthdate: [birth year] Sex: female Admission Date (Current Location): 06/30/2022  [facility] and [place] Number:  Herbalist and Address:  [facility] [place]. [facility], [place] [address], [place], [place] [address]      Provider Number: [id number]  Attending Physician Name and Address:  Tawni Millers,*  Relative Name and Phone Number:  [name] [phone]    Current Level of Care: Hospital Recommended Level of Care: [place] Prior Approval Number:    Date Approved/Denied:   PASRR Number: [id number]  Discharge Plan: SNF    Current Diagnoses: Patient Active Problem List   Diagnosis Date Noted   Epigastric pain 07/03/2022   Anemia 07/03/2022   Chronic GERD 07/03/2022   Leukocytosis    Pericarditis 06/30/2022   Acute on chronic diastolic CHF (congestive heart failure) ([place]) 06/22/2022   DNR (do not resuscitate) 06/22/2022   Urinary frequency 03/01/2022   CKD (chronic kidney disease) stage 3, GFR 30-59 ml/min (Index) 02/07/2022   Hyperlipidemia 02/07/2022   Osteoarthritis, multiple sites 02/07/2022   Peripheral edema 02/07/2022   Recurrent depression ([place]) 02/07/2022   Blind left eye 02/07/2022   Abnormal nuclear cardiac imaging test    DOE (dyspnea on exertion) 10/17/2019   ARF (acute renal failure) ([place]) 10/17/2019   Acute lower UTI 10/17/2019   Acute cystitis without hematuria    Atrial fibrillation ([place]) 06/05/2018   Chronic anticoagulation 06/05/2018   Chronic UTI 06/05/2018   Cardiac device in situ    Chest pain of uncertain etiology    Bradycardia 06/03/2018   Thoracic aortic aneurysm [facility]): a. 4.1 cm ascending thoracic aortic aneurysm 01/23/17 01/23/2017   Syncope 01/21/2017   Hypertension 01/21/2017   Hypothyroidism 01/21/2017    Orientation RESPIRATION BLADDER Height & Weight     Self, Time, Situation, Place   Normal Incontinent, External catheter Weight: 199 lb 11.8 oz (90.6 kg) Height:  '5\' 10"'$  (177.8 cm)  BEHAVIORAL SYMPTOMS/MOOD NEUROLOGICAL BOWEL NUTRITION STATUS      Continent Diet (see d/c summary)  AMBULATORY STATUS COMMUNICATION OF NEEDS Skin   Limited Assist Verbally Other (Comment) (Buttocks mid; moisture associated skin damage)                       Personal Care Assistance Level of Assistance  Bathing, Feeding, Dressing Bathing Assistance: Limited assistance Feeding assistance: Independent Dressing Assistance: Limited assistance     Functional Limitations Info  Sight, Hearing, Speech Sight Info: Impaired Hearing Info: Adequate Speech Info: Adequate    SPECIAL CARE FACTORS FREQUENCY  OT (By licensed OT), PT (By licensed PT)     PT Frequency: 5x/week OT Frequency: 5x/week            Contractures Contractures Info: Not present    Additional Factors Info  Code Status, Allergies Code Status Info: DNR Allergies Info: Donepezil,Ciprofloxacin,Latex,Levofloxacin,Macrobid (nitrofurantoin Monohyd Macro),Nitrofurantoin,Sulfa Antibiotics,Sulfamethoxazole           Current Medications (07/04/2022):  This is the current hospital active medication list Current Facility-Administered Medications  Medication Dose Route Frequency Provider Last Rate Last Admin   0.9 %  sodium chloride infusion  250 mL Intravenous PRN Wellington Hampshire, MD       acetaminophen (TYLENOL) tablet 650 mg  650 mg Oral Q4H PRN Wellington Hampshire, MD   650 mg at 07/02/22 1321   alum & mag hydroxide-simeth (MAALOX/MYLANTA) 200-200-20 MG/5ML  suspension 15 mL  15 mL Oral Q4H PRN Wellington Hampshire, MD       amiodarone (PACERONE) tablet 200 mg  200 mg Oral BID Kathlyn Sacramento A, MD   200 mg at 07/04/22 2119   atenolol (TENORMIN) tablet 25 mg  25 mg Oral Daily Kathlyn Sacramento A, MD   25 mg at 07/04/22 0813   cholecalciferol (VITAMIN D3) 25 MCG (1000 UNIT) tablet 5,000 Units  5,000 Units Oral Daily Kathlyn Sacramento A, MD   5,000 Units at 07/04/22 0813   colchicine tablet 0.6 mg  0.6 mg Oral Q48H Arida, Muhammad A, MD   0.6 mg at 07/02/22 1820   FLUoxetine (PROZAC) capsule 20 mg  20 mg Oral Daily Kathlyn Sacramento A, MD   20 mg at 07/04/22 4174   levothyroxine (SYNTHROID) tablet 88 mcg  88 mcg Oral Q0600 Kathlyn Sacramento A, MD   88 mcg at 07/04/22 0648   metoprolol tartrate (LOPRESSOR) injection 2.5 mg  2.5 mg Intravenous Q6H PRN Wellington Hampshire, MD       omega-3 acid ethyl esters (LOVAZA) capsule 2,000 mg  2,000 mg Oral Daily Kathlyn Sacramento A, MD   2,000 mg at 07/04/22 0813   ondansetron (ZOFRAN) injection 4 mg  4 mg Intravenous Q6H PRN Wellington Hampshire, MD       pantoprazole (PROTONIX) EC tablet 40 mg  40 mg Oral Daily Kathlyn Sacramento A, MD   40 mg at 07/04/22 0814   predniSONE (DELTASONE) tablet 20 mg  20 mg Oral Q breakfast Kathlyn Sacramento A, MD   20 mg at 07/04/22 4818   rosuvastatin (CRESTOR) tablet 20 mg  20 mg Oral QPM Kathlyn Sacramento A, MD   20 mg at 07/03/22 1741   sodium chloride flush (NS) 0.9 % injection 3 mL  3 mL Intravenous Q12H Kathlyn Sacramento A, MD   3 mL at 07/04/22 0648   sodium chloride flush (NS) 0.9 % injection 3 mL  3 mL Intravenous PRN Wellington Hampshire, MD   3 mL at 07/03/22 2056   traMADol (ULTRAM) tablet 50 mg  50 mg Oral Daily Wellington Hampshire, MD   50 mg at 07/04/22 5631     Discharge Medications: Please see discharge summary for a list of discharge medications.  Relevant Imaging Results:  Relevant Lab Results:   Additional Information SSN-[id number]  Bethann Berkshire, LCSW

## 2022-07-05 ENCOUNTER — Encounter: Payer: Self-pay | Admitting: Internal Medicine

## 2022-07-05 ENCOUNTER — Telehealth: Payer: Self-pay

## 2022-07-05 ENCOUNTER — Non-Acute Institutional Stay (SKILLED_NURSING_FACILITY): Payer: Medicare Other | Admitting: Internal Medicine

## 2022-07-05 DIAGNOSIS — R531 Weakness: Secondary | ICD-10-CM

## 2022-07-05 DIAGNOSIS — I5043 Acute on chronic combined systolic (congestive) and diastolic (congestive) heart failure: Secondary | ICD-10-CM | POA: Diagnosis not present

## 2022-07-05 DIAGNOSIS — R110 Nausea: Secondary | ICD-10-CM | POA: Diagnosis not present

## 2022-07-05 DIAGNOSIS — N1831 Chronic kidney disease, stage 3a: Secondary | ICD-10-CM

## 2022-07-05 DIAGNOSIS — D649 Anemia, unspecified: Secondary | ICD-10-CM

## 2022-07-05 DIAGNOSIS — F339 Major depressive disorder, recurrent, unspecified: Secondary | ICD-10-CM

## 2022-07-05 DIAGNOSIS — K219 Gastro-esophageal reflux disease without esophagitis: Secondary | ICD-10-CM

## 2022-07-05 DIAGNOSIS — I10 Essential (primary) hypertension: Secondary | ICD-10-CM

## 2022-07-05 DIAGNOSIS — I309 Acute pericarditis, unspecified: Secondary | ICD-10-CM

## 2022-07-05 DIAGNOSIS — E039 Hypothyroidism, unspecified: Secondary | ICD-10-CM

## 2022-07-05 DIAGNOSIS — I4891 Unspecified atrial fibrillation: Secondary | ICD-10-CM | POA: Diagnosis not present

## 2022-07-05 DIAGNOSIS — E785 Hyperlipidemia, unspecified: Secondary | ICD-10-CM

## 2022-07-05 NOTE — Progress Notes (Signed)
Provider:   Location:  [facility] Room Number: 45 Place of Service:  SNF (31)  PCP: [name], MD Patient Care Team: [name], MD as PCP - General (Internal Medicine) Fay Records, MD as PCP - Cardiology (Cardiology)  Extended Emergency Contact Information Primary Emergency Contact: May,Sharon Address: [address]           [place], [place] [address] [name] of [place] Phone: [phone] Mobile Phone: [phone] Relation: Daughter Secondary Emergency Contact: Henderson,Miriam Address: [address]          [place], [place] [address] [name] of [place] Phone: [phone] Mobile Phone: [phone] Relation: Daughter  Code Status:  Goals of Care: Advanced Directive information    07/08/2022   10:59 AM  Advanced Directives  Does Patient Have a Medical Advance Directive? Yes  Type of Advance Directive Out of facility DNR (pink MOST or yellow form);[place];Living will  Does patient want to make changes to medical advance directive? No - Patient declined  Copy of [place] in Chart? Yes - validated most recent copy scanned in chart (See row information)  Pre-existing out of facility DNR order (yellow form or pink MOST form) Pink Most/Yellow Form available - Physician notified to receive inpatient order      Chief Complaint  Patient presents with   New Admit To SNF    Admission to SNF    HPI: Patient is a 88 y.o. female seen today for admission to SNF  Patient admitted in the hospital from 7/27 to 7/31 for pericardial effusion  Patient has h/o hypertension, aortic stenosis, hypothyroidism She also has a history of A-fib echo with mild AS S/p PPM CHF hypothyroidism History of recurrent UTI on suppressive therapy  depression Osteoporosis sees Dr. 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 Her CT scan of Chest showed marked pericardial effusion She underwent pericardiocentesis on 07/28 and had 600 cc of bloody fluid removal Pathology is negative for any malignant cells She was also started on prednisone and colchicine. Eliquis is on hold.  She feels better but still weak to do her ADLS Working with therapy Able to walk with Assist Fatigue and SOB on Exertion   Past Medical History:  Diagnosis Date   Anxiety    Aortic regurgitation    Atrial fibrillation (HCC)    Chest pain    Per new patient form   Gastritis    GERD (gastroesophageal reflux disease)    H/O artificial eye lens    Hypertension    Hypothyroid    Left ventricular hypertrophy    Major depression    Osteoarthritis    Osteoporosis    Pacemaker    On Eliquis, Per new patient form   Peptic ulcer disease    Stress incontinence    Past Surgical History:  Procedure Laterality Date   ABDOMINAL SURGERY     Per new patient form   BLADDER SURGERY     DG  BONE DENSITY ([place] HX)     Per new patient form   EYE SURGERY     KNEE ARTHROPLASTY     LEFT HEART CATH AND CORONARY ANGIOGRAPHY N/A 10/22/2019   Procedure: LEFT HEART CATH AND CORONARY ANGIOGRAPHY;  Surgeon: Sherren Mocha, MD;  Location: [facility];  Service: Cardiovascular;  Laterality: N/A;  LEG SURGERY Right    multiple surgeries, unknown type   PACEMAKER IMPLANT N/A 06/04/2018   Procedure: PACEMAKER IMPLANT;  Surgeon: Deboraha Sprang, MD;  Location: [facility];  Service: Cardiovascular;  Laterality: N/A;   PERICARDIOCENTESIS N/A 07/01/2022   Procedure: PERICARDIOCENTESIS;  Surgeon: Wellington Hampshire, MD;  Location: [facility];  Service: Cardiovascular;  Laterality: N/A;    reports that she has never smoked. She has never used smokeless tobacco. She reports that she does not drink alcohol and does not use drugs. Social History   Socioeconomic History   Marital  status: Widowed    Spouse name: Not on file   Number of children: Not on file   Years of education: Not on file   Highest education level: Not on file  Occupational History   Occupation: Retired  Tobacco Use   Smoking status: Never   Smokeless tobacco: Never  Vaping Use   Vaping Use: Never used  Substance and Sexual Activity   Alcohol use: No   Drug use: No   Sexual activity: Not on file  Other Topics Concern   Not on file  Social History Narrative   Diet: Regular       Caffeine: Coffee and coke      Married, if yes what year: Widow      Do you live in a house, apartment, assisted living, condo, trailer, ect: Currently living at [facility] [employer] [facility]. Want to move to assisted living      Is it one or more stories: one      How many persons live in your home? 1       Pets: no      Highest level or education completed: 2 years college       Current/Past profession: Network engineer      Exercise:  Walk and drumming                 Type and how often: 1x each weekly         Living Will: Yes    DNR: No if not, do you wish to discuss one? Yes   POA/HPOA: Yes      Functional Status:    Do you have difficulty bathing or dressing yourself? Some yes   Do you have difficulty preparing food or eating? Some yes   Do you have difficulty managing your medications? Yes   Do you have difficulty managing your finances? Yes   Do you have difficulty affording your medications? No   Social Determinants of Radio broadcast assistant Strain: Not on file  Food Insecurity: Not on file  Transportation Needs: Not on file  Physical Activity: Not on file  Stress: Not on file  Social Connections: Not on file  Intimate Partner Violence: Not on file    Functional Status Survey:    Family History  Problem Relation Age of Onset   Congestive Heart Failure Mother    Arthritis Mother    Heart attack Father    Leukemia Sister    Coronary artery disease Neg Hx     Health Maintenance   Topic Date Due   TETANUS/TDAP  Never done   Zoster Vaccines- Shingrix (1 of 2) Never done   COVID-19 Vaccine (3 - Pfizer risk series) 11/10/2020   INFLUENZA VACCINE  07/05/2022   Pneumonia Vaccine 58+ Years old  Completed   DEXA SCAN  Completed   HPV VACCINES  Aged Out  Allergies  Allergen Reactions   Donepezil Nausea And Vomiting and Other (See Comments)    Severe vomiting   Ciprofloxacin Other (See Comments)    Not listed on MAR   Latex Other (See Comments)    Not listed on MAR   Levofloxacin Other (See Comments)    Not listed on MAR   Macrobid [Nitrofurantoin Monohyd Macro] Other (See Comments)    Not listed on MAR   Nitrofurantoin Other (See Comments)    Not listed on MAR   Sulfa Antibiotics Other (See Comments)    Not listed on MAR   Sulfamethoxazole     Not listed on [facility]    Outpatient Encounter Medications as of 07/05/2022  Medication Sig   amiodarone (PACERONE) 200 MG tablet Take 1 tablet (200 mg total) by mouth 2 (two) times daily.   atenolol (TENORMIN) 25 MG tablet Take 25 mg by mouth daily.    Cholecalciferol (VITAMIN D3) 125 MCG (5000 UT) TABS Take 5,000 Units by mouth daily.   colchicine 0.6 MG tablet Take 1 tablet (0.6 mg total) by mouth every other day.   FLUoxetine (PROZAC) 20 MG capsule Take 20 mg by mouth daily.   FLUoxetine (PROZAC) 40 MG capsule Take 40 mg by mouth in the morning.   furosemide (LASIX) 20 MG tablet Take 1 tablet (20 mg total) by mouth daily. Take 20 mg by mouth on Monday, Tuesday, Wednesday, Thursday, Friday, and Saturday   levothyroxine (SYNTHROID) 88 MCG tablet Take 88 mcg by mouth daily.   losartan (COZAAR) 25 MG tablet Take 37.5 mg by mouth daily.   magnesium hydroxide (MILK OF MAGNESIA) 400 MG/5ML suspension Take 30 mLs by mouth daily as needed for mild constipation. If no BM in 24 hours, check for hard stool in rectum. Remove digitally if present. Fleet's enema per rectum x1 dose after removing hard stool from rectum. DO NOT USE FOR  CHRONIC KIDNEY DISEASE   nystatin powder Apply 1 Application topically 2 (two) times daily. For 3 weeks   Omega-3 Fatty Acids (OMEGA-3 FISH OIL) 1200 MG CAPS Take 2,400 mg by mouth daily.   omeprazole (PRILOSEC OTC) 20 MG tablet Take 20 mg by mouth daily.   Polyethyl Glycol-Propyl Glycol (SYSTANE) 0.4-0.3 % GEL ophthalmic gel Place 1 application into both eyes at bedtime.   predniSONE (DELTASONE) 20 MG tablet Take 1 tablet (20 mg total) by mouth daily with breakfast.   rosuvastatin (CRESTOR) 20 MG tablet TAKE ONE TABLET EVERY EVENING   traMADol (ULTRAM) 50 MG tablet Take 1 tablet (50 mg total) by mouth daily.   [DISCONTINUED] cephALEXin (KEFLEX) 250 MG capsule Take 250 mg by mouth daily.   No facility-administered encounter medications on file as of 07/05/2022.    Review of Systems  Constitutional:  Positive for activity change. Negative for appetite change.  HENT: Negative.    Respiratory:  Positive for shortness of breath.   Cardiovascular:  Negative for leg swelling.  Gastrointestinal:  Negative for constipation.  Genitourinary: Negative.   Musculoskeletal:  Positive for gait problem. Negative for arthralgias and myalgias.  Skin: Negative.   Neurological:  Positive for weakness. Negative for dizziness.  Psychiatric/Behavioral:  Negative for confusion, dysphoric mood and sleep disturbance.     Vitals:   07/05/22 1514  BP: 117/63  Pulse: 73  Resp: 20  Temp: (!) 96.8 F (36 C)  SpO2: 93%  Weight: 199 lb 11.2 oz (90.6 kg)  Height: 5' 8"  (1.727 m)   Body mass index is 30.36 kg/m.  Physical Exam Vitals reviewed.  Constitutional:      Appearance: Normal appearance.  HENT:     Head: Normocephalic.     Nose: Nose normal.     Mouth/Throat:     Mouth: Mucous membranes are moist.     Pharynx: Oropharynx is clear.  Eyes:     Pupils: Pupils are equal, round, and reactive to light.  Cardiovascular:     Rate and Rhythm: Normal rate and regular rhythm.     Pulses: Normal pulses.      Heart sounds: Murmur heard.  Pulmonary:     Effort: Pulmonary effort is normal.     Breath sounds: Normal breath sounds.  Abdominal:     General: Abdomen is flat. Bowel sounds are normal.     Palpations: Abdomen is soft.  Musculoskeletal:        General: No swelling.     Cervical back: Neck supple.  Skin:    General: Skin is warm.  Neurological:     General: No focal deficit present.     Mental Status: She is alert and oriented to person, place, and time.  Psychiatric:        Mood and Affect: Mood normal.        Thought Content: Thought content normal.     Labs reviewed: Basic Metabolic Panel: Recent Labs    06/24/22 0239 06/25/22 0201 07/01/22 0212 07/02/22 0105 07/03/22 0412  NA 136   < > 132* 134* 132*  K 3.5   < > 3.3* 3.7 3.5  CL 102   < > 98 98 95*  CO2 26   < > 24 28 28   GLUCOSE 118*   < > 155* 114* 96  BUN 21   < > 16 19 24*  CREATININE 1.49*   < > 1.37* 1.31* 1.32*  CALCIUM 8.2*   < > 8.7* 8.4* 8.3*  MG 2.2  --   --  1.9 2.1   < > = values in this interval not displayed.   Liver Function Tests: Recent Labs    06/22/22 0435 06/30/22 1049 07/01/22 0212  AST 27 48* 38  ALT 26 50* 48*  ALKPHOS 56 88 88  BILITOT 0.4 0.8 0.6  PROT 6.1* 6.1* 6.3*  ALBUMIN 3.4* 3.0* 3.1*   Recent Labs    12/08/21 1630 06/22/22 0435 06/30/22 1049  LIPASE 45 37 23   No results for input(s): "AMMONIA" in the last 8760 hours. CBC: Recent Labs    05/26/22 1610 06/22/22 0435 06/30/22 1049 07/01/22 0212 07/02/22 0105 07/03/22 0412  WBC 11.6 8.4 18.1* 18.0* 13.0* 12.0*  NEUTROABS 10,463.00 7.2 16.0*  --   --   --   HGB 9.6* 9.5* 10.2* 10.0* 9.4* 9.9*  HCT 31* 29.2* 31.3* 30.9* 29.0* 30.7*  MCV  --  84.6 82.4 80.9 81.0 80.8  PLT  --  208 383 426* 406* 434*   Cardiac Enzymes: No results for input(s): "CKTOTAL", "CKMB", "CKMBINDEX", "TROPONINI" in the last 8760 hours. BNP: Invalid input(s): "POCBNP" No results found for: "HGBA1C" Lab Results  Component  Value Date   TSH 3.665 06/30/2022   Lab Results  Component Value Date   VITAMINB12 615 01/21/2017   No results found for: "FOLATE" No results found for: "IRON", "TIBC", "FERRITIN"  Imaging and Procedures obtained prior to SNF admission: DG ESOPHAGUS W SINGLE CM (SOL OR THIN BA)  Result Date: 07/01/2022 CLINICAL DATA:  Dysphagia, abdominal pain, chest pain, vomiting. EXAM: ESOPHAGUS/BARIUM SWALLOW/TABLET STUDY TECHNIQUE: A  single contrast examination was performed using thin liquid barium. Additionally, the patient swallowed a 13 mm barium tablet under fluoroscopy. The exam was performed by Candiss Norse, PA-C, and was supervised and interpreted by Kellie Simmering, D.O. FLUOROSCOPY: Fluoroscopy time: 3 minutes, 30 seconds (22.30 mGy). COMPARISON:  CT abdomen/pelvis 06/30/2022. FINDINGS: Patulous esophagus. No evidence of fixed esophageal stricture, esophageal mass or mucosal abnormality. Moderate-to-severe intermittent esophageal dysmotility with tertiary contractions. Moderate-volume gastroesophageal reflux observed to the level of the mid esophagus. Small to moderate-sized hiatal hernia. The patient swallowed a 13 mm barium tablet, which passed freely into the stomach. IMPRESSION: 1. No evidence of esophageal stricture. 2. Patulous esophagus. 3. Moderate-to-severe intermittent esophageal dysmotility with tertiary contractions. 4. Moderate-volume gastroesophageal reflux observed to the level of the mid esophagus. 5. Small to moderate-sized hiatal hernia. Electronically Signed   By: Kellie Simmering D.O.   On: 07/01/2022 16:06   ECHOCARDIOGRAM LIMITED  Result Date: 07/01/2022    ECHOCARDIOGRAM LIMITED REPORT   Patient Name:   AELIANA SPATES Date of Exam: 07/01/2022 Medical Rec #:  [id number]      Height:       68.0 in Accession #:    [id number]     Weight:       207.6 lb Date of Birth:  March 06, 1930       BSA:          2.076 m Patient Age:    26 years       BP:           134/106 mmHg Patient Gender: F               HR:           96 bpm. Exam Location:  Inpatient Procedure: Limited Echo and Saline Contrast Bubble Study Indications:    Pericardial effusion  History:        Patient has prior history of Echocardiogram examinations, most                 recent 06/30/2022.  Sonographer:    Merrie Roof RDCS Referring Phys: [phone] [facility] A ARIDA  Sonographer Comments: 600 ml of blood tinged fluid was drawn out with no complications. IMPRESSIONS  1. Left ventricular ejection fraction, by estimation, is 60 to 65%. The left ventricle has normal function.  2. Difficult to visualize bubble study, no significant interatrial shunt. Conclusion(s)/Recommendation(s): Pre pericardiocentesis shows large pericardial effusion. Post shows resolution of pericardial effusion. FINDINGS  Left Ventricle: Left ventricular ejection fraction, by estimation, is 60 to 65%. The left ventricle has normal function. Pericardium: The pericardial effusion is circumferential. IAS/Shunts: Agitated saline contrast was given intravenously to evaluate for intracardiac shunting. Phineas Inches Electronically signed by Phineas Inches Signature Date/Time: 07/01/2022/12:51:50 PM    Final    CARDIAC CATHETERIZATION  Result Date: 07/01/2022 Successful pericardiocentesis via the subxiphoid area.  I removed 600 mL of bloody fluid.  On echocardiogram, there was resolution of pericardial effusion.  I elected not to keep the pigtail catheter in given that I could not advance the the regular catheter provided with the pericardiocentesis kit due to what seems to be significantly fibrosed pericardium.  I ended up using a short 5 French pigtail catheter which I felt was not a good option to leave in place. Recommendations: Given that the effusion is bloody, differential diagnosis include malignancy or pericarditis with hemorrhagic transformation given that she is on anticoagulation.  I recommend holding Eliquis for at least few weeks. The fluid was sent to  the lab for analysis.    ECHOCARDIOGRAM LIMITED  Result Date: 07/01/2022    ECHOCARDIOGRAM LIMITED REPORT   Patient Name:   AEISHA MINARIK Date of Exam: 07/01/2022 Medical Rec #:  [id number]      Height:       70.0 in Accession #:    [id number]     Weight:       200.8 lb Date of Birth:  March 08, 1930       BSA:          2.091 m Patient Age:    77 years       BP:           158/97 mmHg Patient Gender: F              HR:           111 bpm. Exam Location:  Inpatient Procedure: Limited Echo, Limited Color Doppler and Cardiac Doppler Indications:    pericardial effusion  History:        Patient has prior history of Echocardiogram examinations, most                 recent 06/30/2022. Pericarditis, chronic kidney disease,                 Arrythmias:Atrial Fibrillation; Risk Factors:Hypertension and                 Dyslipidemia.  Sonographer:    Johny Chess RDCS Referring Phys: Cassie Freer O'NEAL IMPRESSIONS  1. Left ventricular ejection fraction, by estimation, is 60 to 65%. The left ventricle has normal function.  2. Large pericardial effusion. The pericardial effusion is circumferential. Moderate pleural effusion.  3. The inferior vena cava is dilated in size with <50% respiratory variability, suggesting right atrial pressure of 15 mmHg. Conclusion(s)/Recommendation(s): Mild features of cardiac tamponade, recommend clinical correlation. No significant changes from prior echo. FINDINGS  Left Ventricle: Left ventricular ejection fraction, by estimation, is 60 to 65%. The left ventricle has normal function. Pericardium: A large pericardial effusion is present. The pericardial effusion is circumferential. Venous: The inferior vena cava is dilated in size with less than 50% respiratory variability, suggesting right atrial pressure of 15 mmHg. Additional Comments: There is a moderate pleural effusion. IVC IVC diam: 2.70 cm Phineas Inches Electronically signed by Phineas Inches Signature Date/Time: 07/01/2022/10:21:29 AM    Final    ECHOCARDIOGRAM  LIMITED  Result Date: 06/30/2022    ECHOCARDIOGRAM LIMITED REPORT   Patient Name:   BARI LEIB Date of Exam: 06/30/2022 Medical Rec #:  [id number]      Height:       70.0 in Accession #:    [id number]     Weight:       200.0 lb Date of Birth:  June 27, 1930       BSA:          2.087 m Patient Age:    66 years       BP:           114/81 mmHg Patient Gender: F              HR:           101 bpm. Exam Location:  Inpatient Procedure: Limited Echo                       STAT ECHO Reported to: Dr Loralie Champagne on 06/30/2022 12:00:00 AM  Dr. Lake Bells O'Neal bedside for echo. Indications:    Pericardial effusion  History:        Patient has prior history of Echocardiogram examinations, most                 recent 06/23/2022.  Sonographer:    Clayton Lefort RDCS (AE) Referring Phys: [phone] HAO MENG IMPRESSIONS  1. Large pericardial effusion. The pericardial effusion is circumferential. The IVC is dilated though the RV does not appear to show diastolic collapse. No respirometry. I am concerned for early tamponade.  2. Left ventricular ejection fraction, by estimation, is 60 to 65%. The left ventricle has normal function. The left ventricle has no regional wall motion abnormalities. Left ventricular diastolic parameters are indeterminate.  3. Right ventricular systolic function is normal. The right ventricular size is normal.  4. The aortic valve is tricuspid. There is severe calcifcation of the aortic valve. not fully evaluated.  5. The inferior vena cava is dilated in size with <50% respiratory variability, suggesting right atrial pressure of 15 mmHg.  6. The patient was in atrial fibrillation. FINDINGS  Left Ventricle: Left ventricular ejection fraction, by estimation, is 60 to 65%. The left ventricle has normal function. The left ventricle has no regional wall motion abnormalities. Left ventricular diastolic parameters are indeterminate. Right Ventricle: The right ventricular size is normal. No increase in right  ventricular wall thickness. Right ventricular systolic function is normal. Pericardium: A large pericardial effusion is present. The pericardial effusion is circumferential. Aortic Valve: The aortic valve is tricuspid. There is severe calcifcation of the aortic valve. Not fully evaluated. Venous: The inferior vena cava is dilated in size with less than 50% respiratory variability, suggesting right atrial pressure of 15 mmHg. IVC IVC diam: 3.00 cm Dalton McleanMD Electronically signed by Franki Monte Signature Date/Time: 06/30/2022/3:08:41 PM    Final    CT Abdomen Pelvis W Contrast  Result Date: 06/30/2022 CLINICAL DATA:  Nausea and emesis. Abdominal distension. Pallor. Abdominal pain. EXAM: CT ABDOMEN AND PELVIS WITH CONTRAST TECHNIQUE: Multidetector CT imaging of the abdomen and pelvis was performed using the standard protocol following bolus administration of intravenous contrast. RADIATION DOSE REDUCTION: This exam was performed according to the departmental dose-optimization program which includes automated exposure control, adjustment of the mA and/or kV according to patient size and/or use of iterative reconstruction technique. CONTRAST:  119m OMNIPAQUE IOHEXOL 350 MG/ML SOLN COMPARISON:  Multiple exams, including CT chest 10/16/2019 FINDINGS: Lower chest: Large pericardial effusion, not present on 10/16/2019. Pacer leads noted. Mild to moderate cardiomegaly. Aortic valve calcification. Thoracic aortic atherosclerotic calcification. Small to moderate hiatal hernia. Fluid distention of the distal esophagus. Small to moderate bilateral pleural effusions with passive atelectasis. Mild scarring or atelectasis in the right middle lobe and lingula. Hepatobiliary: Accentuated density in the gallbladder, probably from sludge. Mildly accentuated enhancement in the wall of the common hepatic duct and common bile duct. No intrahepatic biliary dilatation. Indistinct stranding in the porta hepatis. Pancreas:  Hypodense pancreatic tail lesion, 2.4 by 1.6 cm on image 36 series 3, internal density 9 Hounsfield units. Fluid density lesion along the inferior margin of the pancreatic body, 3.0 by 1.6 cm on image 39 series 3, internal density 2 Hounsfield units. Questionable prominence of the dorsal and ventral pancreatic duct in the pancreatic head. Possible uncinate process cystic lesion, 1.6 by 0.8 cm on image 40 series 3. Spleen: Unremarkable Adrenals/Urinary Tract: Multiple bilateral moderate to large sized benign renal cysts. 1.8 by 1.4 cm focus of hypoenhancement in  the renal parenchyma adjacent to the posterior renal cyst of the left kidney upper pole on image 14 series 8 and image 39 series 3. Urinary bladder unremarkable. The adrenal glands appear normal. Stomach/Bowel: Sigmoid colon diverticulosis, no compelling findings of active diverticulitis. Vascular/Lymphatic: Atherosclerosis is present, including aortoiliac atherosclerotic disease. There is atheromatous plaque proximally in the celiac trunk and SMA without overt occlusion. Reproductive: Unremarkable Other: Low-level edema along the root of the mesentery. Infiltrative edema in the porta hepatis. Musculoskeletal: Prominent degenerative hip arthropathy bilaterally. Bony demineralization. Left sacral nerve stimulator device. Old healed right medial eleventh rib fracture. Dextroconvex lumbar scoliosis. Mild rectus diastasis and anterior abdominal wall laxity. Lumbar spondylosis and degenerative disc disease contribute to multilevel foraminal stenosis and central narrowing of the thecal sac IMPRESSION: 1. Very large pericardial effusion, not present on 10/16/2019. Correlate with physical exam findings such as Becks triad in assessing for signs of tamponade, echocardiography may be warranted in further workup. 2. Inflammatory stranding in the porta hepatis some accentuated enhancement along the non dilated extrahepatic biliary tree. Cannot exclude cholangitis. 3.  Focal hypoenhancement in the upper medial right kidney renal parenchyma, potentially from pyelonephritis or infiltrative malignancy. Correlate with urine analysis in assessing for signs of pyelonephritis. 4. Multiple hypodense, probably cystic lesions of the pancreas. The largest measures 3.0 cm in long axis. In light of the size the lesions and the patient's age, follow up pancreatic protocol CT or MRI is recommended in 2 years time. The patient does have a pacer and a sacral nerve stimulator which may preclude MRI. This recommendation follows ACR consensus guidelines: Management of Incidental Pancreatic Cysts: A White Paper of the ACR Incidental Findings Committee. [place] [id number]. 5. Small to moderate bilateral pleural effusions with passive atelectasis. 6. Other imaging findings of potential clinical significance: Aortic valve calcification. Aortic Atherosclerosis (ICD10-I70.0). Small to moderate hiatal hernia. Multiple bilateral moderate to large sized renal cysts. Sigmoid colon diverticulosis. Systemic atherosclerosis. Prominent degenerative hip arthropathy bilaterally. Dextroconvex lumbar scoliosis. Mild rectus diastasis. Bony demineralization. Multilevel lumbar impingement due to spondylosis and degenerative disc disease. Electronically Signed   By: Van Clines M.D.   On: 06/30/2022 13:07   DG Abd Acute W/Chest  Result Date: 06/30/2022 CLINICAL DATA:  Emesis EXAM: DG ABDOMEN ACUTE WITH 1 VIEW CHEST COMPARISON:  Radiographs 06/22/2022 FINDINGS: Mild gaseous distention of the cecum/right colon at the upper limits of normal. No dilation of the remaining colon or small. No radiopaque calculi or other significant radiographic abnormality is seen. Heart size and mediastinal contours are within normal limits. Sacral stimulator. Enlarged cardiomediastinal silhouette. Atelectasis/consolidation in the left lower lung. Scattered areas of linear atelectasis/scarring. Possible small left  pleural effusion. Aortic atherosclerotic calcification. Advanced thoracolumbar spondylosis. IMPRESSION: 1. Negative abdominal radiographs. 2. Presumed atelectasis in the left lower lung though pneumonia is difficult to exclude. Electronically Signed   By: Placido Sou M.D.   On: 06/30/2022 11:25    Assessment/Plan 1. Acute pericardial effusion On Prednisone and Colchicine ? Prednisone Taper per Cardiology Will wait for their input Fluid so far negative for Malignant cells  2. Atrial fibrillation, unspecified type ([place]) On Amiodarone Off Eliquis Also on Tenormin  3. Acute on chronic combined systolic and diastolic CHF (congestive heart failure) (HCC) On her baseline dose of lasix   4. Nausea Mostly better and eating better  5. Primary hypertension On Cozaar and Tenormin  6. Stage 3b chronic kidney disease (HCC) Creat is stable  7. Hypothyroidism, unspecified type TSH normal 07/23  8.  Recurrent depression ([place]) On High Dose of Prozac  9. Anemia, unspecified type Cotninue to follow HGB Most likely due to Pericardial bleed   10. Chronic GERD Continue Prilosec  11. Hyperlipidemia, unspecified hyperlipidemia type On Statin  12. Generalized weakness Working with therapy    Family/ staff Communication:   Labs/tests ordered:CBC in 1 week

## 2022-07-05 NOTE — Telephone Encounter (Signed)
Transition Care Management Unsuccessful Follow-up Telephone Call  Date of discharge and from where:  [facility] 07/04/2022  Attempts:  1st Attempt  Reason for unsuccessful TCM follow-up call:  Unable to reach patient, Unable to be reached ;due to release into a skilled nursing facility.

## 2022-07-06 LAB — CULTURE, BODY FLUID W GRAM STAIN -BOTTLE: Culture: NO GROWTH

## 2022-07-08 ENCOUNTER — Encounter: Payer: Self-pay | Admitting: Nurse Practitioner

## 2022-07-08 ENCOUNTER — Non-Acute Institutional Stay (SKILLED_NURSING_FACILITY): Payer: Medicare Other | Admitting: Nurse Practitioner

## 2022-07-08 ENCOUNTER — Ambulatory Visit: Payer: Medicare Other | Admitting: Student

## 2022-07-08 DIAGNOSIS — I4891 Unspecified atrial fibrillation: Secondary | ICD-10-CM

## 2022-07-08 DIAGNOSIS — M159 Polyosteoarthritis, unspecified: Secondary | ICD-10-CM

## 2022-07-08 DIAGNOSIS — I300 Acute nonspecific idiopathic pericarditis: Secondary | ICD-10-CM

## 2022-07-08 DIAGNOSIS — N1831 Chronic kidney disease, stage 3a: Secondary | ICD-10-CM

## 2022-07-08 DIAGNOSIS — Z959 Presence of cardiac and vascular implant and graft, unspecified: Secondary | ICD-10-CM

## 2022-07-08 DIAGNOSIS — I10 Essential (primary) hypertension: Secondary | ICD-10-CM

## 2022-07-08 DIAGNOSIS — E785 Hyperlipidemia, unspecified: Secondary | ICD-10-CM

## 2022-07-08 DIAGNOSIS — D649 Anemia, unspecified: Secondary | ICD-10-CM

## 2022-07-08 DIAGNOSIS — E871 Hypo-osmolality and hyponatremia: Secondary | ICD-10-CM | POA: Diagnosis not present

## 2022-07-08 DIAGNOSIS — R609 Edema, unspecified: Secondary | ICD-10-CM

## 2022-07-08 DIAGNOSIS — E039 Hypothyroidism, unspecified: Secondary | ICD-10-CM

## 2022-07-08 DIAGNOSIS — K5901 Slow transit constipation: Secondary | ICD-10-CM

## 2022-07-08 DIAGNOSIS — N390 Urinary tract infection, site not specified: Secondary | ICD-10-CM

## 2022-07-08 NOTE — Progress Notes (Signed)
Location:  [facility] Room Number: NO/42/B Place of Service:  SNF (31) Provider:  Sharesa Kemp X, NP [name], MD  Patient Care Team: [name], MD as PCP - General (Internal Medicine) Fay Records, MD as PCP - Cardiology (Cardiology)  Extended Emergency Contact Information Primary Emergency Contact: May,Sharon Address: [address]           [place], [place] [address] [name] of [place] Phone: [phone] Mobile Phone: [phone] Relation: Daughter Secondary Emergency Contact: Henderson,Miriam Address: [address]          [place], [address] [address] [name] of [place] Phone: [phone] Mobile Phone: [phone] Relation: Daughter  Code Status:  DNR Goals of care: Advanced Directive information    07/08/2022   10:59 AM  Advanced Directives  Does Patient Have a Medical Advance Directive? Yes  Type of Advance Directive Out of facility DNR (pink MOST or yellow form);[place];Living will  Does patient want to make changes to medical advance directive? No - Patient declined  Copy of [place] in Chart? Yes - validated most recent copy scanned in chart (See row information)  Pre-existing out of facility DNR order (yellow form or pink MOST form) Pink Most/Yellow Form available - Physician notified to receive inpatient order     Chief Complaint  Patient presents with  . Medical Management of Chronic Issues    Patient is here for a follow up for chronic conditions discuss need for vaccines    HPI:  Pt is a 88 y.o. female seen today for managing chronic medical conditions.   Hospitalized 06/30/22-07/04/22 for pericardial effusion, taking Prednisone, Colchicine, negative for malignant cells.   Afib, taking Amiodarone, Atenolol, off Eliquis  Hyponatremia, Na 132 07/03/22             Pacemaker             AS hx             UTI off suppression therapy              Depression, on  Fluoxetine             Edema BLE, taking Furosemide, BNP 577 12/08/21<<793.1 06/22/22,  01/13/22 echo EF 60-65%             Hypothyroidism, taking Levothyroxine decreased to 79mg 2/2 TSH 0.23 03/31/22             HTN, on Losartan, Atenolol             CKD Bun/cret 24/1.32 07/03/22             Anemia, Hgb 9.9 07/03/22             GERD, takes Omeprazole             Hyperlipidemia, on Rosuvastatin             OA, taking Tramadol             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   OP f/u Dr. BAmil Amen               Past Medical History:  Diagnosis Date  . Anxiety   . Aortic regurgitation   . Atrial fibrillation ([place]   . Chest pain    Per new patient form  . Gastritis   . GERD (gastroesophageal reflux disease)   . H/O artificial  eye lens   . Hypertension   . Hypothyroid   . Left ventricular hypertrophy   . Major depression   . Osteoarthritis   . Osteoporosis   . Pacemaker    On Eliquis, Per new patient form  . Peptic ulcer disease   . Stress incontinence    Past Surgical History:  Procedure Laterality Date  . ABDOMINAL SURGERY     Per new patient form  . BLADDER SURGERY    . DG  BONE DENSITY ([place] HX)     Per new patient form  . EYE SURGERY    . KNEE ARTHROPLASTY    . LEFT HEART CATH AND CORONARY ANGIOGRAPHY N/A 10/22/2019   Procedure: LEFT HEART CATH AND CORONARY ANGIOGRAPHY;  Surgeon: Sherren Mocha, MD;  Location: [facility];  Service: Cardiovascular;  Laterality: N/A;  . LEG SURGERY Right    multiple surgeries, unknown type  . PACEMAKER IMPLANT N/A 06/04/2018   Procedure: PACEMAKER IMPLANT;  Surgeon: Deboraha Sprang, MD;  Location: [facility];  Service: Cardiovascular;  Laterality: N/A;  . PERICARDIOCENTESIS N/A 07/01/2022   Procedure: PERICARDIOCENTESIS;  Surgeon: Wellington Hampshire, MD;  Location: [facility];  Service: Cardiovascular;  Laterality: N/A;    Allergies  Allergen Reactions  .  Donepezil Nausea And Vomiting and Other (See Comments)    Severe vomiting  . Ciprofloxacin Other (See Comments)    Not listed on MAR  . Latex Other (See Comments)    Not listed on MAR  . Levofloxacin Other (See Comments)    Not listed on MAR  . Macrobid [employer] Macro] Other (See Comments)    Not listed on MAR  . Nitrofurantoin Other (See Comments)    Not listed on MAR  . Sulfa Antibiotics Other (See Comments)    Not listed on MAR  . Sulfamethoxazole     Not listed on [facility]    Outpatient Encounter Medications as of 07/08/2022  Medication Sig  . amiodarone (PACERONE) 200 MG tablet Take 1 tablet (200 mg total) by mouth 2 (two) times daily.  Marland Kitchen atenolol (TENORMIN) 25 MG tablet Take 25 mg by mouth daily.   . Cholecalciferol (VITAMIN D3) 125 MCG (5000 UT) TABS Take 5,000 Units by mouth daily.  . colchicine 0.6 MG tablet Take 1 tablet (0.6 mg total) by mouth every other day.  Marland Kitchen FLUoxetine (PROZAC) 20 MG capsule Take 20 mg by mouth daily.  Marland Kitchen FLUoxetine (PROZAC) 40 MG capsule Take 40 mg by mouth in the morning.  . furosemide (LASIX) 20 MG tablet Take 1 tablet (20 mg total) by mouth daily. Take 20 mg by mouth on Monday, Tuesday, Wednesday, Thursday, Friday, and Saturday  . levothyroxine (SYNTHROID) 88 MCG tablet Take 88 mcg by mouth daily.  Marland Kitchen losartan (COZAAR) 25 MG tablet Take 37.5 mg by mouth daily.  . magnesium hydroxide (MILK OF MAGNESIA) 400 MG/5ML suspension Take 30 mLs by mouth daily as needed for mild constipation. If no BM in 24 hours, check for hard stool in rectum. Remove digitally if present. Fleet's enema per rectum x1 dose after removing hard stool from rectum. DO NOT USE FOR CHRONIC KIDNEY DISEASE  . nystatin powder Apply 1 Application topically 2 (two) times daily. For 3 weeks  . Omega-3 Fatty Acids (OMEGA-3 FISH OIL) 1200 MG CAPS Take 2,400 mg by mouth daily.  Marland Kitchen omeprazole (PRILOSEC OTC) 20 MG tablet Take 20 mg by mouth daily.  Vladimir Faster Glycol-Propyl Glycol  (SYSTANE) 0.4-0.3 %  GEL ophthalmic gel Place 1 application into both eyes at bedtime.  . predniSONE (DELTASONE) 20 MG tablet Take 1 tablet (20 mg total) by mouth daily with breakfast.  . rosuvastatin (CRESTOR) 20 MG tablet TAKE ONE TABLET EVERY EVENING  . traMADol (ULTRAM) 50 MG tablet Take 1 tablet (50 mg total) by mouth daily.   No facility-administered encounter medications on file as of 07/08/2022.    Review of Systems  Constitutional:  Positive for activity change, appetite change and fatigue. Negative for fever.  HENT:  Negative for congestion and trouble swallowing.   Eyes:  Positive for visual disturbance.       Left eye blind, failed multiple cornea transplants   Respiratory:  Negative for cough, shortness of breath and wheezing.   Cardiovascular:  Positive for chest pain and leg swelling. Negative for palpitations.  Gastrointestinal:  Negative for abdominal pain and constipation.       Epigastric pain.   Genitourinary:  Positive for frequency. Negative for dysuria and urgency.  Musculoskeletal:  Positive for arthralgias. Negative for gait problem.  Skin:  Positive for pallor.  Neurological:  Negative for speech difficulty and headaches.  Psychiatric/Behavioral:  Negative for behavioral problems, dysphoric mood and sleep disturbance. The patient is not nervous/anxious.     Immunization History  Administered Date(s) Administered  . Influenza Split 09/13/2017, 09/06/2018, 09/18/2019  . Influenza, High Dose Seasonal PF 09/04/2021  . Moderna SARS-COV2 Booster Vaccination 10/13/2020  . PFIZER(Purple Top)SARS-COV-2 Vaccination 12/16/2019, 01/16/2020  . PNEUMOCOCCAL CONJUGATE-20 12/23/2021   Pertinent  Health Maintenance Due  Topic Date Due  . INFLUENZA VACCINE  07/05/2022  . DEXA SCAN  Completed      07/02/2022    9:00 AM 07/02/2022   10:00 PM 07/03/2022   10:00 AM 07/04/2022    8:30 AM 07/08/2022   10:59 AM  Fall Risk  Patient Fall Risk Level High fall risk High fall risk  High fall risk High fall risk High fall risk  Patient at Risk for Falls Due to     History of fall(s);Impaired balance/gait;Impaired mobility;Impaired vision  Fall risk Follow up     Falls evaluation completed   Functional Status Survey:    Vitals:   07/08/22 1101  BP: (!) 150/85  Pulse: 70  Resp: 16  Temp: (!) 96.2 F (35.7 C)  SpO2: 95%  Weight: 202 lb (91.6 kg)  Height: _0  (1.727 m)   Body mass index is 30.71 kg/m. Physical Exam Vitals and nursing note reviewed.  Constitutional:      Comments: fatigue  HENT:     Head: Normocephalic and atraumatic.     Nose: Nose normal.     Mouth/Throat:     Mouth: Mucous membranes are dry.  Eyes:     Extraocular Movements: Extraocular movements intact.     Conjunctiva/sclera: Conjunctivae normal.     Pupils: Pupils are equal, round, and reactive to light.  Cardiovascular:     Rate and Rhythm: Regular rhythm. Tachycardia present.     Heart sounds: Murmur heard.     Comments: pacemaker Pulmonary:     Effort: Pulmonary effort is normal.  Chest:     Chest wall: Tenderness present.  Abdominal:     General: Bowel sounds are normal.     Palpations: Abdomen is soft.     Tenderness: There is no abdominal tenderness.     Comments: Epigastric region pain palpated, complained  Musculoskeletal:     Cervical back: Normal range of motion and neck supple.  Right lower leg: Edema present.     Left lower leg: Edema present.     Comments: Trace edema BLE  Skin:    General: Skin is warm and dry.     Coloration: Skin is pale.  Neurological:     General: No focal deficit present.     Mental Status: She is alert and oriented to person, place, and time. Mental status is at baseline.     Gait: Gait abnormal.  Psychiatric:        Mood and Affect: Mood normal.        Behavior: Behavior normal.        Thought Content: Thought content normal.    Labs reviewed: Recent Labs    06/24/22 0239 06/25/22 0201 07/01/22 0212 07/02/22 0105  07/03/22 0412  NA 136   < > 132* 134* 132*  K 3.5   < > 3.3* 3.7 3.5  CL 102   < > 98 98 95*  CO2 26   < > _0 GLUCOSE 118*   < > 155* 114* 96  BUN 21   < > 16 19 24*  CREATININE 1.49*   < > 1.37* 1.31* 1.32*  CALCIUM 8.2*   < > 8.7* 8.4* 8.3*  MG 2.2  --   --  1.9 2.1   < > = values in this interval not displayed.   Recent Labs    06/22/22 0435 06/30/22 1049 07/01/22 0212  AST 27 48* 38  ALT 26 50* 48*  ALKPHOS 56 88 88  BILITOT 0.4 0.8 0.6  PROT 6.1* 6.1* 6.3*  ALBUMIN 3.4* 3.0* 3.1*   Recent Labs    05/26/22 1610 06/22/22 0435 06/30/22 1049 07/01/22 0212 07/02/22 0105 07/03/22 0412  WBC 11.6 8.4 18.1* 18.0* 13.0* 12.0*  NEUTROABS 10,463.00 7.2 16.0*  --   --   --   HGB 9.6* 9.5* 10.2* 10.0* 9.4* 9.9*  HCT 31* 29.2* 31.3* 30.9* 29.0* 30.7*  MCV  --  84.6 82.4 80.9 81.0 80.8  PLT  --  208 383 426* 406* 434*   Lab Results  Component Value Date   TSH 3.665 06/30/2022   No results found for: "HGBA1C" Lab Results  Component Value Date   CHOL 138 02/08/2022   HDL 71 (A) 02/08/2022   LDLCALC 55 02/08/2022   TRIG 42 02/08/2022    Significant Diagnostic Results in last 30 days:  ECHOCARDIOGRAM LIMITED  Result Date: 07/02/2022    ECHOCARDIOGRAM LIMITED REPORT   Patient Name:   MEKHI LASCOLA Date of Exam: 07/02/2022 Medical Rec #:  [id number]      Height:       68.0 in Accession #:    [id number]     Weight:       205.9 lb Date of Birth:  1929/12/23       BSA:          2.069 m Patient Age:    62 years       BP:           118/80 mmHg Patient Gender: F              HR:           73 bpm. Exam Location:  Inpatient Procedure: 3D Echo, Cardiac Doppler and Color Doppler Indications:    I31.3 Pericardial effusion (noninflammatory)  History:        Patient has prior history of Echocardiogram examinations, most  recent 07/01/2022. CHF, Abnormal ECG and Pacemaker,                 Arrythmias:Atrial Fibrillation and Bradycardia,                  Signs/Symptoms:Dyspnea and Syncope; Risk Factors:Dyslipidemia.                 Tamponade.  Sonographer:    Roseanna Rainbow RDCS Referring Phys: [place]  1. Left ventricular ejection fraction, by estimation, is 60 to 65%. The left ventricle has normal function.  2. Right ventricular systolic function is normal. The right ventricular size is normal. There is normal pulmonary artery systolic pressure.  3. Residual predominantly small pericardial effusion; largest along the lateral/posterior wall. No rapid accumulation of fluid. There is no evidence of cardiac tamponade. Large pleural effusion. FINDINGS  Left Ventricle: Left ventricular ejection fraction, by estimation, is 60 to 65%. The left ventricle has normal function. Right Ventricle: The right ventricular size is normal. Right ventricular systolic function is normal. There is normal pulmonary artery systolic pressure. The tricuspid regurgitant velocity is 2.10 m/s, and with an assumed right atrial pressure of 3 mmHg,  the estimated right ventricular systolic pressure is 96.2 mmHg. Pericardium: Residual predominantly small pericardial effusion; largest along the lateral/posterior wall. No rapid accumulation of fluid. There is no evidence of cardiac tamponade. Additional Comments: There is a large pleural effusion. LEFT VENTRICLE PLAX 2D LVIDd:         4.00 cm LVIDs:         2.50 cm LV PW:         1.20 cm LV IVS:        1.70 cm  IVC IVC diam: 2.10 cm LEFT ATRIUM         Index LA diam:    2.90 cm 1.40 cm/m   AORTA Ao Root diam: 3.50 cm TRICUSPID VALVE TR Peak grad:   17.6 mmHg TR Vmax:        210.00 cm/s Phineas Inches Electronically signed by Phineas Inches Signature Date/Time: 07/02/2022/4:47:07 PM    Final    DG ESOPHAGUS W SINGLE CM (SOL OR THIN BA)  Result Date: 07/01/2022 CLINICAL DATA:  Dysphagia, abdominal pain, chest pain, vomiting. EXAM: ESOPHAGUS/BARIUM SWALLOW/TABLET STUDY TECHNIQUE: A single contrast examination was performed using thin  liquid barium. Additionally, the patient swallowed a 13 mm barium tablet under fluoroscopy. The exam was performed by Candiss Norse, PA-C, and was supervised and interpreted by Kellie Simmering, D.O. FLUOROSCOPY: Fluoroscopy time: 3 minutes, 30 seconds (22.30 mGy). COMPARISON:  CT abdomen/pelvis 06/30/2022. FINDINGS: Patulous esophagus. No evidence of fixed esophageal stricture, esophageal mass or mucosal abnormality. Moderate-to-severe intermittent esophageal dysmotility with tertiary contractions. Moderate-volume gastroesophageal reflux observed to the level of the mid esophagus. Small to moderate-sized hiatal hernia. The patient swallowed a 13 mm barium tablet, which passed freely into the stomach. IMPRESSION: 1. No evidence of esophageal stricture. 2. Patulous esophagus. 3. Moderate-to-severe intermittent esophageal dysmotility with tertiary contractions. 4. Moderate-volume gastroesophageal reflux observed to the level of the mid esophagus. 5. Small to moderate-sized hiatal hernia. Electronically Signed   By: Kellie Simmering D.O.   On: 07/01/2022 16:06   ECHOCARDIOGRAM LIMITED  Result Date: 07/01/2022    ECHOCARDIOGRAM LIMITED REPORT   Patient Name:   JAXSON KEENER Date of Exam: 07/01/2022 Medical Rec #:  [id number]      Height:       68.0 in Accession #:    [id number]  Weight:       207.6 lb Date of Birth:  May 18, 1930       BSA:          2.076 m Patient Age:    89 years       BP:           134/106 mmHg Patient Gender: F              HR:           96 bpm. Exam Location:  Inpatient Procedure: Limited Echo and Saline Contrast Bubble Study Indications:    Pericardial effusion  History:        Patient has prior history of Echocardiogram examinations, most                 recent 06/30/2022.  Sonographer:    Merrie Roof RDCS Referring Phys: [phone] [facility] A ARIDA  Sonographer Comments: 600 ml of blood tinged fluid was drawn out with no complications. IMPRESSIONS  1. Left ventricular ejection fraction, by estimation, is  60 to 65%. The left ventricle has normal function.  2. Difficult to visualize bubble study, no significant interatrial shunt. Conclusion(s)/Recommendation(s): Pre pericardiocentesis shows large pericardial effusion. Post shows resolution of pericardial effusion. FINDINGS  Left Ventricle: Left ventricular ejection fraction, by estimation, is 60 to 65%. The left ventricle has normal function. Pericardium: The pericardial effusion is circumferential. IAS/Shunts: Agitated saline contrast was given intravenously to evaluate for intracardiac shunting. Phineas Inches Electronically signed by Phineas Inches Signature Date/Time: 07/01/2022/12:51:50 PM    Final    CARDIAC CATHETERIZATION  Result Date: 07/01/2022 Successful pericardiocentesis via the subxiphoid area.  I removed 600 mL of bloody fluid.  On echocardiogram, there was resolution of pericardial effusion.  I elected not to keep the pigtail catheter in given that I could not advance the the regular catheter provided with the pericardiocentesis kit due to what seems to be significantly fibrosed pericardium.  I ended up using a short 5 French pigtail catheter which I felt was not a good option to leave in place. Recommendations: Given that the effusion is bloody, differential diagnosis include malignancy or pericarditis with hemorrhagic transformation given that she is on anticoagulation.  I recommend holding Eliquis for at least few weeks. The fluid was sent to the lab for analysis.   ECHOCARDIOGRAM LIMITED  Result Date: 07/01/2022    ECHOCARDIOGRAM LIMITED REPORT   Patient Name:   KEIASHA DIEP Date of Exam: 07/01/2022 Medical Rec #:  [id number]      Height:       70.0 in Accession #:    [id number]     Weight:       200.8 lb Date of Birth:  August 21, 1930       BSA:          2.091 m Patient Age:    49 years       BP:           158/97 mmHg Patient Gender: F              HR:           111 bpm. Exam Location:  Inpatient Procedure: Limited Echo, Limited Color Doppler and  Cardiac Doppler Indications:    pericardial effusion  History:        Patient has prior history of Echocardiogram examinations, most                 recent 06/30/2022. Pericarditis, chronic kidney disease,  Arrythmias:Atrial Fibrillation; Risk Factors:Hypertension and                 Dyslipidemia.  Sonographer:    Johny Chess RDCS Referring Phys: Cassie Freer [place] IMPRESSIONS  1. Left ventricular ejection fraction, by estimation, is 60 to 65%. The left ventricle has normal function.  2. Large pericardial effusion. The pericardial effusion is circumferential. Moderate pleural effusion.  3. The inferior vena cava is dilated in size with <50% respiratory variability, suggesting right atrial pressure of 15 mmHg. Conclusion(s)/Recommendation(s): Mild features of cardiac tamponade, recommend clinical correlation. No significant changes from prior echo. FINDINGS  Left Ventricle: Left ventricular ejection fraction, by estimation, is 60 to 65%. The left ventricle has normal function. Pericardium: A large pericardial effusion is present. The pericardial effusion is circumferential. Venous: The inferior vena cava is dilated in size with less than 50% respiratory variability, suggesting right atrial pressure of 15 mmHg. Additional Comments: There is a moderate pleural effusion. IVC IVC diam: 2.70 cm Phineas Inches Electronically signed by Phineas Inches Signature Date/Time: 07/01/2022/10:21:29 AM    Final    ECHOCARDIOGRAM LIMITED  Result Date: 06/30/2022    ECHOCARDIOGRAM LIMITED REPORT   Patient Name:   NAHARA DONA Date of Exam: 06/30/2022 Medical Rec #:  [id number]      Height:       70.0 in Accession #:    [id number]     Weight:       200.0 lb Date of Birth:  July 14, 1930       BSA:          2.087 m Patient Age:    22 years       BP:           114/81 mmHg Patient Gender: F              HR:           101 bpm. Exam Location:  Inpatient Procedure: Limited Echo                       STAT ECHO Reported to: Dr  Loralie Champagne on 06/30/2022 12:00:00 AM          Dr. Lake Bells O'[place] bedside for echo. Indications:    Pericardial effusion  History:        Patient has prior history of Echocardiogram examinations, most                 recent 06/23/2022.  Sonographer:    Clayton Lefort RDCS (AE) Referring Phys: [id number] HAO MENG IMPRESSIONS  1. Large pericardial effusion. The pericardial effusion is circumferential. The IVC is dilated though the RV does not appear to show diastolic collapse. No respirometry. I am concerned for early tamponade.  2. Left ventricular ejection fraction, by estimation, is 60 to 65%. The left ventricle has normal function. The left ventricle has no regional wall motion abnormalities. Left ventricular diastolic parameters are indeterminate.  3. Right ventricular systolic function is normal. The right ventricular size is normal.  4. The aortic valve is tricuspid. There is severe calcifcation of the aortic valve. not fully evaluated.  5. The inferior vena cava is dilated in size with <50% respiratory variability, suggesting right atrial pressure of 15 mmHg.  6. The patient was in atrial fibrillation. FINDINGS  Left Ventricle: Left ventricular ejection fraction, by estimation, is 60 to 65%. The left ventricle has normal function. The left ventricle has no regional wall motion abnormalities. Left ventricular diastolic parameters  are indeterminate. Right Ventricle: The right ventricular size is normal. No increase in right ventricular wall thickness. Right ventricular systolic function is normal. Pericardium: A large pericardial effusion is present. The pericardial effusion is circumferential. Aortic Valve: The aortic valve is tricuspid. There is severe calcifcation of the aortic valve. Not fully evaluated. Venous: The inferior vena cava is dilated in size with less than 50% respiratory variability, suggesting right atrial pressure of 15 mmHg. IVC IVC diam: 3.00 cm Dalton McleanMD Electronically signed by Franki Monte Signature Date/Time: 06/30/2022/3:08:41 PM    Final    CT Abdomen Pelvis W Contrast  Result Date: 06/30/2022 CLINICAL DATA:  Nausea and emesis. Abdominal distension. Pallor. Abdominal pain. EXAM: CT ABDOMEN AND PELVIS WITH CONTRAST TECHNIQUE: Multidetector CT imaging of the abdomen and pelvis was performed using the standard protocol following bolus administration of intravenous contrast. RADIATION DOSE REDUCTION: This exam was performed according to the departmental dose-optimization program which includes automated exposure control, adjustment of the mA and/or kV according to patient size and/or use of iterative reconstruction technique. CONTRAST:  138m OMNIPAQUE IOHEXOL 350 MG/ML SOLN COMPARISON:  Multiple exams, including CT chest 10/16/2019 FINDINGS: Lower chest: Large pericardial effusion, not present on 10/16/2019. Pacer leads noted. Mild to moderate cardiomegaly. Aortic valve calcification. Thoracic aortic atherosclerotic calcification. Small to moderate hiatal hernia. Fluid distention of the distal esophagus. Small to moderate bilateral pleural effusions with passive atelectasis. Mild scarring or atelectasis in the right middle lobe and lingula. Hepatobiliary: Accentuated density in the gallbladder, probably from sludge. Mildly accentuated enhancement in the wall of the common hepatic duct and common bile duct. No intrahepatic biliary dilatation. Indistinct stranding in the porta hepatis. Pancreas: Hypodense pancreatic tail lesion, 2.4 by 1.6 cm on image 36 series 3, internal density 9 Hounsfield units. Fluid density lesion along the inferior margin of the pancreatic body, 3.0 by 1.6 cm on image 39 series 3, internal density 2 Hounsfield units. Questionable prominence of the dorsal and ventral pancreatic duct in the pancreatic head. Possible uncinate process cystic lesion, 1.6 by 0.8 cm on image 40 series 3. Spleen: Unremarkable Adrenals/Urinary Tract: Multiple bilateral moderate to large sized  benign renal cysts. 1.8 by 1.4 cm focus of hypoenhancement in the renal parenchyma adjacent to the posterior renal cyst of the left kidney upper pole on image 14 series 8 and image 39 series 3. Urinary bladder unremarkable. The adrenal glands appear normal. Stomach/Bowel: Sigmoid colon diverticulosis, no compelling findings of active diverticulitis. Vascular/Lymphatic: Atherosclerosis is present, including aortoiliac atherosclerotic disease. There is atheromatous plaque proximally in the celiac trunk and SMA without overt occlusion. Reproductive: Unremarkable Other: Low-level edema along the root of the mesentery. Infiltrative edema in the porta hepatis. Musculoskeletal: Prominent degenerative hip arthropathy bilaterally. Bony demineralization. Left sacral nerve stimulator device. Old healed right medial eleventh rib fracture. Dextroconvex lumbar scoliosis. Mild rectus diastasis and anterior abdominal wall laxity. Lumbar spondylosis and degenerative disc disease contribute to multilevel foraminal stenosis and central narrowing of the thecal sac IMPRESSION: 1. Very large pericardial effusion, not present on 10/16/2019. Correlate with physical exam findings such as Becks triad in assessing for signs of tamponade, echocardiography may be warranted in further workup. 2. Inflammatory stranding in the porta hepatis some accentuated enhancement along the non dilated extrahepatic biliary tree. Cannot exclude cholangitis. 3. Focal hypoenhancement in the upper medial right kidney renal parenchyma, potentially from pyelonephritis or infiltrative malignancy. Correlate with urine analysis in assessing for signs of pyelonephritis. 4. Multiple hypodense, probably cystic lesions of the pancreas. The largest  measures 3.0 cm in long axis. In light of the size the lesions and the patient's age, follow up pancreatic protocol CT or MRI is recommended in 2 years time. The patient does have a pacer and a sacral nerve stimulator which may  preclude MRI. This recommendation follows ACR consensus guidelines: Management of Incidental Pancreatic Cysts: A White Paper of the ACR Incidental Findings Committee. [place] [id number];[id number]. 5. Small to moderate bilateral pleural effusions with passive atelectasis. 6. Other imaging findings of potential clinical significance: Aortic valve calcification. Aortic Atherosclerosis (ICD10-I70.0). Small to moderate hiatal hernia. Multiple bilateral moderate to large sized renal cysts. Sigmoid colon diverticulosis. Systemic atherosclerosis. Prominent degenerative hip arthropathy bilaterally. Dextroconvex lumbar scoliosis. Mild rectus diastasis. Bony demineralization. Multilevel lumbar impingement due to spondylosis and degenerative disc disease. Electronically Signed   By: Van Clines M.D.   On: 06/30/2022 13:07   DG Abd Acute W/Chest  Result Date: 06/30/2022 CLINICAL DATA:  Emesis EXAM: DG ABDOMEN ACUTE WITH 1 VIEW CHEST COMPARISON:  Radiographs 06/22/2022 FINDINGS: Mild gaseous distention of the cecum/right colon at the upper limits of normal. No dilation of the remaining colon or small. No radiopaque calculi or other significant radiographic abnormality is seen. Heart size and mediastinal contours are within normal limits. Sacral stimulator. Enlarged cardiomediastinal silhouette. Atelectasis/consolidation in the left lower lung. Scattered areas of linear atelectasis/scarring. Possible small left pleural effusion. Aortic atherosclerotic calcification. Advanced thoracolumbar spondylosis. IMPRESSION: 1. Negative abdominal radiographs. 2. Presumed atelectasis in the left lower lung though pneumonia is difficult to exclude. Electronically Signed   By: Placido Sou M.D.   On: 06/30/2022 11:25   ECHOCARDIOGRAM COMPLETE  Result Date: 06/23/2022    ECHOCARDIOGRAM REPORT   Patient Name:   DARIANNY MOMON Date of Exam: 06/23/2022 Medical Rec #:  [id number]      Height:       68.0 in Accession #:     [id number]     Weight:       196.2 lb Date of Birth:  16-Jun-1930       BSA:          2.027 m Patient Age:    44 years       BP:           111/70 mmHg Patient Gender: F              HR:           81 bpm. Exam Location:  Inpatient Procedure: 2D Echo, Cardiac Doppler and Color Doppler Indications:    Acute respiratory distress  History:        Patient has prior history of Echocardiogram examinations, most                 recent 01/13/2022. Pacemaker, Arrythmias:Atrial Fibrillation; Risk                 Factors:Hypertension.  Sonographer:    Joette Catching RCS Referring Phys: [place]  1. Moderate hypertrophy of the basal septum with otherwise mild LVH. Left ventricular ejection fraction, by estimation, is 60 to 65%. The left ventricle has normal function. The left ventricle has no regional wall motion abnormalities. There is moderate  asymmetric left ventricular hypertrophy. Left ventricular diastolic parameters are indeterminate. Elevated left ventricular end-diastolic pressure.  2. Right ventricular systolic function is normal. The right ventricular size is normal. There is mildly elevated pulmonary artery systolic pressure.  3. Left atrial size was severely dilated.  4. A small pericardial  effusion is present. The pericardial effusion is posterior and lateral to the left ventricle. There is no evidence of cardiac tamponade. Moderate pleural effusion in the left lateral region.  5. The mitral valve is normal in structure. Trivial mitral valve regurgitation. No evidence of mitral stenosis.  6. The aortic valve is tricuspid. There is moderate calcification of the aortic valve. There is moderate thickening of the aortic valve. Aortic valve regurgitation is mild. Moderate aortic valve stenosis. Aortic valve area, by VTI measures 1.95 cm. Aortic valve mean gradient measures 22.0 mmHg. Aortic valve Vmax measures 2.93 m/s.  7. Aortic dilatation noted. There is mild dilatation of the aortic root,  measuring 37 mm. There is moderate dilatation of the ascending aorta, measuring 46 mm.  8. The inferior vena cava is dilated in size with >50% respiratory variability, suggesting right atrial pressure of 8 mmHg. FINDINGS  Left Ventricle: Moderate hypertrophy of the basal septum with otherwise mild LVH. Left ventricular ejection fraction, by estimation, is 60 to 65%. The left ventricle has normal function. The left ventricle has no regional wall motion abnormalities. The left ventricular internal cavity size was normal in size. There is moderate asymmetric left ventricular hypertrophy. Left ventricular diastolic function could not be evaluated due to atrial fibrillation. Left ventricular diastolic parameters are indeterminate. Elevated left ventricular end-diastolic pressure. Right Ventricle: The right ventricular size is normal. No increase in right ventricular wall thickness. Right ventricular systolic function is normal. There is mildly elevated pulmonary artery systolic pressure. The tricuspid regurgitant velocity is 2.98  m/s, and with an assumed right atrial pressure of 8 mmHg, the estimated right ventricular systolic pressure is 20.9 mmHg. Left Atrium: Left atrial size was severely dilated. Right Atrium: Right atrial size was normal in size. Pericardium: A small pericardial effusion is present. The pericardial effusion is posterior and lateral to the left ventricle. There is no evidence of cardiac tamponade. Mitral Valve: The mitral valve is normal in structure. Trivial mitral valve regurgitation. No evidence of mitral valve stenosis. Tricuspid Valve: The tricuspid valve is normal in structure. Tricuspid valve regurgitation is mild . No evidence of tricuspid stenosis. Aortic Valve: The aortic valve is tricuspid. There is moderate calcification of the aortic valve. There is moderate thickening of the aortic valve. Aortic valve regurgitation is mild. Aortic regurgitation PHT measures 705 msec. Moderate aortic  stenosis is present. Aortic valve mean gradient measures 22.0 mmHg. Aortic valve peak gradient measures 34.3 mmHg. Aortic valve area, by VTI measures 1.95 cm. Pulmonic Valve: The pulmonic valve was normal in structure. Pulmonic valve regurgitation is trivial. No evidence of pulmonic stenosis. Aorta: Aortic dilatation noted. There is mild dilatation of the aortic root, measuring 37 mm. There is moderate dilatation of the ascending aorta, measuring 46 mm. Venous: The inferior vena cava is dilated in size with greater than 50% respiratory variability, suggesting right atrial pressure of 8 mmHg. IAS/Shunts: No atrial level shunt detected by color flow Doppler. Additional Comments: There is a moderate pleural effusion in the left lateral region.  LEFT VENTRICLE PLAX 2D LVIDd:         3.95 cm   Diastology LVIDs:         2.25 cm   LV e' medial:    5.15 cm/s LV PW:         1.15 cm   LV E/e' medial:  24.3 LV IVS:        1.35 cm   LV e' lateral:   10.30 cm/s LVOT diam:  2.30 cm   LV E/e' lateral: 12.1 LV SV:         107 LV SV Index:   53 LVOT Area:     4.15 cm  RIGHT VENTRICLE            IVC RV Basal diam:  4.10 cm    IVC diam: 2.90 cm RV Mid diam:    2.30 cm RV S prime:     8.93 cm/s TAPSE (M-mode): 1.3 cm LEFT ATRIUM             Index        RIGHT ATRIUM           Index LA diam:        4.00 cm 1.97 cm/m   RA Area:     21.00 cm LA Vol (A2C):   74.1 ml 36.55 ml/m  RA Volume:   49.80 ml  24.57 ml/m LA Vol (A4C):   81.4 ml 40.15 ml/m LA Biplane Vol: 80.9 ml 39.91 ml/m  AORTIC VALVE AV Area (Vmax):    1.74 cm AV Area (Vmean):   1.83 cm AV Area (VTI):     1.95 cm AV Vmax:           293.00 cm/s AV Vmean:          205.500 cm/s AV VTI:            0.548 m AV Peak Grad:      34.3 mmHg AV Mean Grad:      22.0 mmHg LVOT Vmax:         122.50 cm/s LVOT Vmean:        90.300 cm/s LVOT VTI:          0.258 m LVOT/AV VTI ratio: 0.47 AI PHT:            705 msec  AORTA Ao Root diam: 3.65 cm Ao Asc diam:  4.60 cm MITRAL VALVE                 TRICUSPID VALVE MV Area (PHT): 3.76 cm     TV Peak grad:   30.2 mmHg MV Decel Time: 202 msec     TV Vmax:        2.75 m/s MV E velocity: 125.00 cm/s  TR Peak grad:   35.5 mmHg MV A velocity: 31.50 cm/s   TR Vmax:        298.00 cm/s MV E/A ratio:  3.97                             SHUNTS                             Systemic VTI:  0.26 m                             Systemic Diam: 2.30 cm Skeet Latch MD Electronically signed by Skeet Latch MD Signature Date/Time: 06/23/2022/4:05:09 PM    Final    DG Chest Portable 1 View  Result Date: 06/22/2022 CLINICAL DATA:  Chest pain. EXAM: PORTABLE CHEST 1 VIEW COMPARISON:  12/08/2018. FINDINGS: Heart is enlarged and the mediastinal contour is stable. Atherosclerotic calcification of the aorta is noted. A dual lead pacemaker is present over the left chest. Increased atelectasis or infiltrate is noted at the lung bases, greater on the  left than on the right. There is a small left pleural effusion. No pneumothorax. No acute osseous abnormality. IMPRESSION: 1. Increased atelectasis or infiltrate at the lung bases, greater on the left than on the right. 2. Small left pleural effusion. Electronically Signed   By: Brett Fairy M.D.   On: 06/22/2022 04:50    Assessment/Plan Pericarditis Hospitalized 06/30/22-07/04/22 for pericardial effusion, taking Prednisone, Colchicine, negative for malignant cells. C/o chest pain at times, NTG x1 no efficacy, HPOA requested no hospitalization, Hospice service, Morphine prn   Atrial fibrillation (HCC) Heart rate is controlled, taking Amiodarone, Atenolol, off Eliquis  Hyponatremia Na 132 07/03/22  Cardiac device in situ Pacemaker is functioning.   Chronic UTI off suppression therapy   Peripheral edema taking Furosemide, BNP 577 12/08/21<<793.1 06/22/22,  01/13/22 echo EF 60-65%  Hypothyroidism taking Levothyroxine decreased to 20mg 2/2 TSH 0.23 03/31/22, needs f/u TSH  Hypertension Blood pressure is controlled,  on Losartan, Atenolol  CKD (chronic kidney disease) stage 3, GFR 30-59 ml/min (HCC) un/cret 24/1.32 07/03/22  Anemia Hgb 9.9 07/03/22, pending f/u CBC  Hyperlipidemia on Rosuvastatin  Osteoarthritis, multiple sites Continue Tramadol for pain.   Slow transit constipation No BM >2-3 days, will adding MiraLax daily, already had prn MOM today.      Family/ staff Communication: plan of care reviewed with the patient and charge nurse.   Labs/tests ordered:  TSH  Time spend 35 minutes

## 2022-07-10 DIAGNOSIS — E871 Hypo-osmolality and hyponatremia: Secondary | ICD-10-CM | POA: Insufficient documentation

## 2022-07-10 NOTE — Assessment & Plan Note (Signed)
Na 132 07/03/22

## 2022-07-10 NOTE — Assessment & Plan Note (Signed)
on Rosuvastatin

## 2022-07-10 NOTE — Assessment & Plan Note (Signed)
taking Furosemide, BNP 577 12/08/21<<793.1 06/22/22,  01/13/22 echo EF 60-65%

## 2022-07-10 NOTE — Assessment & Plan Note (Signed)
Blood pressure is controlled, on Losartan, Atenolol

## 2022-07-10 NOTE — Assessment & Plan Note (Signed)
Continue Tramadol for pain.

## 2022-07-10 NOTE — Assessment & Plan Note (Signed)
Hospitalized 06/30/22-07/04/22 for pericardial effusion, taking Prednisone, Colchicine, negative for malignant cells. C/o chest pain at times, NTG x1 no efficacy, HPOA requested no hospitalization, Hospice service, Morphine prn

## 2022-07-10 NOTE — Assessment & Plan Note (Signed)
taking Levothyroxine decreased to 53mg 2/2 TSH 0.23 03/31/22, needs f/u TSH

## 2022-07-10 NOTE — Assessment & Plan Note (Signed)
off suppression therapy

## 2022-07-10 NOTE — Assessment & Plan Note (Signed)
Hgb 9.9 07/03/22, pending f/u CBC

## 2022-07-10 NOTE — Assessment & Plan Note (Signed)
Pacemaker is functioning.

## 2022-07-10 NOTE — Assessment & Plan Note (Signed)
Heart rate is controlled, taking Amiodarone, Atenolol, off Eliquis

## 2022-07-10 NOTE — Assessment & Plan Note (Signed)
un/cret 24/1.32 07/03/22

## 2022-07-11 ENCOUNTER — Encounter: Payer: Self-pay | Admitting: Nurse Practitioner

## 2022-07-11 DIAGNOSIS — K5901 Slow transit constipation: Secondary | ICD-10-CM | POA: Insufficient documentation

## 2022-07-11 NOTE — Assessment & Plan Note (Signed)
No BM >2-3 days, will adding MiraLax daily, already had prn MOM today.

## 2022-07-12 NOTE — Addendum Note (Signed)
Addended by: Teddy Pena X on: 07/12/2022 01:01 PM   Modules accepted: Level of Service

## 2022-07-13 ENCOUNTER — Non-Acute Institutional Stay (SKILLED_NURSING_FACILITY): Payer: Medicare Other | Admitting: Nurse Practitioner

## 2022-07-13 ENCOUNTER — Encounter: Payer: Self-pay | Admitting: Nurse Practitioner

## 2022-07-13 DIAGNOSIS — I4891 Unspecified atrial fibrillation: Secondary | ICD-10-CM | POA: Diagnosis not present

## 2022-07-13 DIAGNOSIS — K219 Gastro-esophageal reflux disease without esophagitis: Secondary | ICD-10-CM

## 2022-07-13 DIAGNOSIS — I300 Acute nonspecific idiopathic pericarditis: Secondary | ICD-10-CM | POA: Diagnosis not present

## 2022-07-13 DIAGNOSIS — E871 Hypo-osmolality and hyponatremia: Secondary | ICD-10-CM | POA: Diagnosis not present

## 2022-07-13 DIAGNOSIS — M159 Polyosteoarthritis, unspecified: Secondary | ICD-10-CM

## 2022-07-13 DIAGNOSIS — F339 Major depressive disorder, recurrent, unspecified: Secondary | ICD-10-CM

## 2022-07-13 DIAGNOSIS — I5033 Acute on chronic diastolic (congestive) heart failure: Secondary | ICD-10-CM

## 2022-07-13 DIAGNOSIS — D649 Anemia, unspecified: Secondary | ICD-10-CM

## 2022-07-13 DIAGNOSIS — I10 Essential (primary) hypertension: Secondary | ICD-10-CM

## 2022-07-13 DIAGNOSIS — K5901 Slow transit constipation: Secondary | ICD-10-CM

## 2022-07-13 DIAGNOSIS — N1831 Chronic kidney disease, stage 3a: Secondary | ICD-10-CM

## 2022-07-13 DIAGNOSIS — E785 Hyperlipidemia, unspecified: Secondary | ICD-10-CM

## 2022-07-13 DIAGNOSIS — D72829 Elevated white blood cell count, unspecified: Secondary | ICD-10-CM | POA: Diagnosis not present

## 2022-07-13 DIAGNOSIS — E039 Hypothyroidism, unspecified: Secondary | ICD-10-CM

## 2022-07-13 NOTE — Assessment & Plan Note (Signed)
on Fluoxetine

## 2022-07-13 NOTE — Assessment & Plan Note (Signed)
wbc 13.8, afebrile, 2/2 Prednisone? Vs active infection? Repeat CBC/diff one week.

## 2022-07-13 NOTE — Assessment & Plan Note (Signed)
CHF/Edema BLE, not apparent,  taking Furosemide(reduce from 6x/wk to 5x/wk due to GI symptoms), BNP 577 12/08/21<<793.1 06/22/22,  01/13/22 echo EF 60-65%, f/u BMP one wk

## 2022-07-13 NOTE — Assessment & Plan Note (Signed)
stable, taking MiraLax daily.

## 2022-07-13 NOTE — Assessment & Plan Note (Signed)
Heart rate is in control, taking Amiodarone, Atenolol, off Eliquis

## 2022-07-13 NOTE — Assessment & Plan Note (Signed)
Na 132 07/03/22

## 2022-07-13 NOTE — Assessment & Plan Note (Signed)
Blood pressure is controlled, on Losartan, Atenolol

## 2022-07-13 NOTE — Assessment & Plan Note (Signed)
takes Omeprazole, adding Zofran prn for nausea, vomiting.

## 2022-07-13 NOTE — Progress Notes (Unsigned)
Location:  [facility] Room Number: NO/42/B Place of Service:  SNF (31) Provider:  Sohum Delillo X, NP [name], MD  Patient Care Team: [name], MD as PCP - General (Internal Medicine) Fay Records, MD as PCP - Cardiology (Cardiology)  Extended Emergency Contact Information Primary Emergency Contact: May,Sharon Address: [address]           [place], [place] [address] [name] of [place] Phone: [phone] Mobile Phone: [phone] Relation: Daughter Secondary Emergency Contact: Henderson,Miriam Address: [address]          [place], [place] [address] [name] of [place] Phone: [phone] Mobile Phone: [phone] Relation: Daughter  Code Status:  DNR Goals of care: Advanced Directive information    07/08/2022   10:59 AM  Advanced Directives  Does Patient Have a Medical Advance Directive? Yes  Type of Advance Directive Out of facility DNR (pink MOST or yellow form);[place];Living will  Does patient want to make changes to medical advance directive? No - Patient declined  Copy of [place] in Chart? Yes - validated most recent copy scanned in chart (See row information)  Pre-existing out of facility DNR order (yellow form or pink MOST form) Pink Most/Yellow Form available - Physician notified to receive inpatient order     Chief Complaint  Patient presents with  . Acute Visit    Patient is being seen for nausea and elevated wbc    HPI:  Pt is a 88 y.o. female seen today for an acute visit reported nausea, vomiting 07/12/22, afebrile, no further occurrence today, had a large BM, able to eat meals even if her appetite is not great. She denied chest or abd pain, increased SOB, cough, or O2 desaturation.      Hospitalized 06/30/22-07/04/22 for pericardial effusion, taking Prednisone, Colchicine, negative for malignant cells, likelihood viral complicated with hemorrhage from chronic  Eliquis   Leukocytosis wbc 13.8, afebrile, 2/2 Prednisone? Vs active infection?             Afib, taking Amiodarone, Atenolol, off Eliquis             Hyponatremia, Na 132 07/03/22             Pacemaker             AS hx  Constipation, stable, taking MiraLax daily.              UTI off suppression therapy              Depression, on Fluoxetine             CHF/Edema BLE, not apparent,  taking Furosemide(reduce from 6x/wk to 5x/wk due to GI symptoms), BNP 577 12/08/21<<793.1 06/22/22,  01/13/22 echo EF 60-65%             Hypothyroidism, taking Levothyroxine decreased to 51mg 2/2 TSH 0.23 03/31/22             HTN, on Losartan, Atenolol             CKD Bun/cret 24/1.32 07/03/22             Anemia, Hgb 10.9 07/12/22             GERD, takes Omeprazole             Hyperlipidemia, off Rosuvastatin to reduce GI symptoms              OA, off Tramadol,  Morphine is available to her for comfort measures.              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              OP f/u Dr. Amil Amen  Past Medical History:  Diagnosis Date  . Anxiety   . Aortic regurgitation   . Atrial fibrillation ([place])   . Chest pain    Per new patient form  . Gastritis   . GERD (gastroesophageal reflux disease)   . H/O artificial eye lens   . Hypertension   . Hypothyroid   . Left ventricular hypertrophy   . Major depression   . Osteoarthritis   . Osteoporosis   . Pacemaker    On Eliquis, Per new patient form  . Peptic ulcer disease   . Stress incontinence    Past Surgical History:  Procedure Laterality Date  . ABDOMINAL SURGERY     Per new patient form  . BLADDER SURGERY    . DG  BONE DENSITY ([place] HX)     Per new patient form  . EYE SURGERY    . KNEE ARTHROPLASTY    . LEFT HEART CATH AND CORONARY ANGIOGRAPHY N/A 10/22/2019   Procedure: LEFT HEART CATH AND CORONARY ANGIOGRAPHY;  Surgeon: Sherren Mocha, MD;  Location: [facility];  Service: Cardiovascular;   Laterality: N/A;  . LEG SURGERY Right    multiple surgeries, unknown type  . PACEMAKER IMPLANT N/A 06/04/2018   Procedure: PACEMAKER IMPLANT;  Surgeon: Deboraha Sprang, MD;  Location: [facility];  Service: Cardiovascular;  Laterality: N/A;  . PERICARDIOCENTESIS N/A 07/01/2022   Procedure: PERICARDIOCENTESIS;  Surgeon: Wellington Hampshire, MD;  Location: [facility];  Service: Cardiovascular;  Laterality: N/A;    Allergies  Allergen Reactions  . Donepezil Nausea And Vomiting and Other (See Comments)    Severe vomiting  . Ciprofloxacin Other (See Comments)    Not listed on MAR  . Latex Other (See Comments)    Not listed on MAR  . Levofloxacin Other (See Comments)    Not listed on MAR  . Macrobid [employer] Macro] Other (See Comments)    Not listed on MAR  . Nitrofurantoin Other (See Comments)    Not listed on MAR  . Sulfa Antibiotics Other (See Comments)    Not listed on MAR  . Sulfamethoxazole     Not listed on [facility]    Outpatient Encounter Medications as of 07/13/2022  Medication Sig  . amiodarone (PACERONE) 200 MG tablet Take 1 tablet (200 mg total) by mouth 2 (two) times daily.  Marland Kitchen atenolol (TENORMIN) 25 MG tablet Take 25 mg by mouth daily.   . Cholecalciferol (VITAMIN D3) 125 MCG (5000 UT) TABS Take 5,000 Units by mouth daily.  . colchicine 0.6 MG tablet Take 1 tablet (0.6 mg total) by mouth every other day.  Marland Kitchen FLUoxetine (PROZAC) 20 MG capsule Take 20 mg by mouth daily.  Marland Kitchen FLUoxetine (PROZAC) 40 MG capsule Take 40 mg by mouth in the morning.  . furosemide (LASIX) 20 MG tablet Take 1 tablet (20 mg total) by mouth daily. Take 20 mg by mouth on Monday, Tuesday, Wednesday, Thursday, Friday, and Saturday  . levothyroxine (SYNTHROID) 88 MCG tablet Take 88 mcg by mouth daily.  Marland Kitchen losartan (COZAAR) 25 MG tablet Take 37.5 mg by mouth daily.  . magnesium hydroxide (MILK OF MAGNESIA) 400 MG/5ML suspension Take 30  mLs by mouth daily as needed for mild constipation.  If no BM in 24 hours, check for hard stool in rectum. Remove digitally if present. Fleet's enema per rectum x1 dose after removing hard stool from rectum. DO NOT USE FOR CHRONIC KIDNEY DISEASE  . nystatin powder Apply 1 Application topically 2 (two) times daily. For 3 weeks  . Omega-3 Fatty Acids (OMEGA-3 FISH OIL) 1200 MG CAPS Take 2,400 mg by mouth daily.  Marland Kitchen omeprazole (PRILOSEC OTC) 20 MG tablet Take 20 mg by mouth daily.  Vladimir Faster Glycol-Propyl Glycol (SYSTANE) 0.4-0.3 % GEL ophthalmic gel Place 1 application into both eyes at bedtime.  . predniSONE (DELTASONE) 20 MG tablet Take 1 tablet (20 mg total) by mouth daily with breakfast.  . rosuvastatin (CRESTOR) 20 MG tablet TAKE ONE TABLET EVERY EVENING  . traMADol (ULTRAM) 50 MG tablet Take 1 tablet (50 mg total) by mouth daily.   No facility-administered encounter medications on file as of 07/13/2022.    Review of Systems  Constitutional:  Negative for activity change, appetite change and fever.  HENT:  Negative for congestion and trouble swallowing.   Eyes:  Positive for visual disturbance.       Left eye blind, failed multiple cornea transplants   Respiratory:  Negative for cough, shortness of breath and wheezing.   Cardiovascular:  Negative for chest pain, palpitations and leg swelling.  Gastrointestinal:  Positive for nausea and vomiting. Negative for abdominal pain and constipation.       N/V x1 last night.   Genitourinary:  Positive for frequency. Negative for dysuria and urgency.  Musculoskeletal:  Positive for arthralgias. Negative for gait problem.  Skin:  Positive for pallor.  Neurological:  Negative for speech difficulty and headaches.  Psychiatric/Behavioral:  Negative for behavioral problems, dysphoric mood and sleep disturbance. The patient is not nervous/anxious.     Immunization History  Administered Date(s) Administered  . Influenza Split 09/13/2017, 09/06/2018, 09/18/2019  . Influenza, High Dose Seasonal PF  09/04/2021  . Moderna SARS-COV2 Booster Vaccination 10/13/2020  . PFIZER(Purple Top)SARS-COV-2 Vaccination 12/16/2019, 01/16/2020  . PNEUMOCOCCAL CONJUGATE-20 12/23/2021   Pertinent  Health Maintenance Due  Topic Date Due  . INFLUENZA VACCINE  07/05/2022  . DEXA SCAN  Completed      07/02/2022    9:00 AM 07/02/2022   10:00 PM 07/03/2022   10:00 AM 07/04/2022    8:30 AM 07/08/2022   10:59 AM  Fall Risk  Patient Fall Risk Level High fall risk High fall risk High fall risk High fall risk High fall risk  Patient at Risk for Falls Due to     History of fall(s);Impaired balance/gait;Impaired mobility;Impaired vision  Fall risk Follow up     Falls evaluation completed   Functional Status Survey:    Vitals:   07/13/22 1258  BP: (!) 140/80  Pulse: 80  Resp: 16  Temp: (!) 96.8 F (36 C)  SpO2: 95%  Weight: 202 lb (91.6 kg)  Height: 5' 8"  (1.727 m)   Body mass index is 30.71 kg/m. Physical Exam Vitals and nursing note reviewed.  Constitutional:      Comments: fatigue  HENT:     Head: Normocephalic and atraumatic.     Nose: Nose normal.     Mouth/Throat:     Mouth: Mucous membranes are dry.  Eyes:     Extraocular Movements: Extraocular movements intact.     Conjunctiva/sclera: Conjunctivae normal.     Pupils: Pupils are equal, round, and reactive to light.  Cardiovascular:     Rate and Rhythm: Normal rate and regular rhythm.     Heart sounds: Murmur heard.     Comments: pacemaker Pulmonary:     Effort: Pulmonary effort is normal.     Breath sounds: Normal breath sounds.  Abdominal:     General: Bowel sounds are normal.     Palpations: Abdomen is soft.     Tenderness: There is no abdominal tenderness.     Comments: Epigastric region pain palpated, complained  Musculoskeletal:     Cervical back: Normal range of motion and neck supple.     Right lower leg: No edema.     Left lower leg: No edema.  Skin:    General: Skin is warm and dry.  Neurological:     General: No  focal deficit present.     Mental Status: She is alert and oriented to person, place, and time. Mental status is at baseline.     Gait: Gait abnormal.  Psychiatric:        Mood and Affect: Mood normal.        Behavior: Behavior normal.        Thought Content: Thought content normal.    Labs reviewed: Recent Labs    06/24/22 0239 06/25/22 0201 07/01/22 0212 07/02/22 0105 07/03/22 0412  NA 136   < > 132* 134* 132*  K 3.5   < > 3.3* 3.7 3.5  CL 102   < > 98 98 95*  CO2 26   < > 24 28 28   GLUCOSE 118*   < > 155* 114* 96  BUN 21   < > 16 19 24*  CREATININE 1.49*   < > 1.37* 1.31* 1.32*  CALCIUM 8.2*   < > 8.7* 8.4* 8.3*  MG 2.2  --   --  1.9 2.1   < > = values in this interval not displayed.   Recent Labs    06/22/22 0435 06/30/22 1049 07/01/22 0212  AST 27 48* 38  ALT 26 50* 48*  ALKPHOS 56 88 88  BILITOT 0.4 0.8 0.6  PROT 6.1* 6.1* 6.3*  ALBUMIN 3.4* 3.0* 3.1*   Recent Labs    05/26/22 1610 06/22/22 0435 06/30/22 1049 07/01/22 0212 07/02/22 0105 07/03/22 0412  WBC 11.6 8.4 18.1* 18.0* 13.0* 12.0*  NEUTROABS 10,463.00 7.2 16.0*  --   --   --   HGB 9.6* 9.5* 10.2* 10.0* 9.4* 9.9*  HCT 31* 29.2* 31.3* 30.9* 29.0* 30.7*  MCV  --  84.6 82.4 80.9 81.0 80.8  PLT  --  208 383 426* 406* 434*   Lab Results  Component Value Date   TSH 3.665 06/30/2022   No results found for: "HGBA1C" Lab Results  Component Value Date   CHOL 138 02/08/2022   HDL 71 (A) 02/08/2022   LDLCALC 55 02/08/2022   TRIG 42 02/08/2022    Significant Diagnostic Results in last 30 days:  ECHOCARDIOGRAM LIMITED  Result Date: 07/02/2022    ECHOCARDIOGRAM LIMITED REPORT   Patient Name:   THEO REITHER Date of Exam: 07/02/2022 Medical Rec #:  [id number]      Height:       68.0 in Accession #:    [id number]     Weight:       205.9 lb Date of Birth:  [birth year]       BSA:          2.069 m Patient Age:    59 years  BP:           118/80 mmHg Patient Gender: F              HR:           73 bpm.  Exam Location:  Inpatient Procedure: 3D Echo, Cardiac Doppler and Color Doppler Indications:    I31.3 Pericardial effusion (noninflammatory)  History:        Patient has prior history of Echocardiogram examinations, most                 recent 07/01/2022. CHF, Abnormal ECG and Pacemaker,                 Arrythmias:Atrial Fibrillation and Bradycardia,                 Signs/Symptoms:Dyspnea and Syncope; Risk Factors:Dyslipidemia.                 Tamponade.  Sonographer:    Roseanna Rainbow RDCS Referring Phys: [place]  1. Left ventricular ejection fraction, by estimation, is 60 to 65%. The left ventricle has normal function.  2. Right ventricular systolic function is normal. The right ventricular size is normal. There is normal pulmonary artery systolic pressure.  3. Residual predominantly small pericardial effusion; largest along the lateral/posterior wall. No rapid accumulation of fluid. There is no evidence of cardiac tamponade. Large pleural effusion. FINDINGS  Left Ventricle: Left ventricular ejection fraction, by estimation, is 60 to 65%. The left ventricle has normal function. Right Ventricle: The right ventricular size is normal. Right ventricular systolic function is normal. There is normal pulmonary artery systolic pressure. The tricuspid regurgitant velocity is 2.10 m/s, and with an assumed right atrial pressure of 3 mmHg,  the estimated right ventricular systolic pressure is 51.1 mmHg. Pericardium: Residual predominantly small pericardial effusion; largest along the lateral/posterior wall. No rapid accumulation of fluid. There is no evidence of cardiac tamponade. Additional Comments: There is a large pleural effusion. LEFT VENTRICLE PLAX 2D LVIDd:         4.00 cm LVIDs:         2.50 cm LV PW:         1.20 cm LV IVS:        1.70 cm  IVC IVC diam: 2.10 cm LEFT ATRIUM         Index LA diam:    2.90 cm 1.40 cm/m   AORTA Ao Root diam: 3.50 cm TRICUSPID VALVE TR Peak grad:   17.6 mmHg TR Vmax:         210.00 cm/s Phineas Inches Electronically signed by Phineas Inches Signature Date/Time: 07/02/2022/4:47:07 PM    Final    DG ESOPHAGUS W SINGLE CM (SOL OR THIN BA)  Result Date: 07/01/2022 CLINICAL DATA:  Dysphagia, abdominal pain, chest pain, vomiting. EXAM: ESOPHAGUS/BARIUM SWALLOW/TABLET STUDY TECHNIQUE: A single contrast examination was performed using thin liquid barium. Additionally, the patient swallowed a 13 mm barium tablet under fluoroscopy. The exam was performed by Candiss Norse, PA-C, and was supervised and interpreted by Kellie Simmering, D.O. FLUOROSCOPY: Fluoroscopy time: 3 minutes, 30 seconds (22.30 mGy). COMPARISON:  CT abdomen/pelvis 06/30/2022. FINDINGS: Patulous esophagus. No evidence of fixed esophageal stricture, esophageal mass or mucosal abnormality. Moderate-to-severe intermittent esophageal dysmotility with tertiary contractions. Moderate-volume gastroesophageal reflux observed to the level of the mid esophagus. Small to moderate-sized hiatal hernia. The patient swallowed a 13 mm barium tablet, which passed freely into the stomach. IMPRESSION: 1. No evidence of esophageal stricture. 2.  Patulous esophagus. 3. Moderate-to-severe intermittent esophageal dysmotility with tertiary contractions. 4. Moderate-volume gastroesophageal reflux observed to the level of the mid esophagus. 5. Small to moderate-sized hiatal hernia. Electronically Signed   By: Kellie Simmering D.O.   On: 07/01/2022 16:06   ECHOCARDIOGRAM LIMITED  Result Date: 07/01/2022    ECHOCARDIOGRAM LIMITED REPORT   Patient Name:   SHANAYE RIEF Date of Exam: 07/01/2022 Medical Rec #:  [id number]      Height:       68.0 in Accession #:    [id number]     Weight:       207.6 lb Date of Birth:  29-Jan-1930       BSA:          2.076 m Patient Age:    26 years       BP:           134/106 mmHg Patient Gender: F              HR:           96 bpm. Exam Location:  Inpatient Procedure: Limited Echo and Saline Contrast Bubble Study Indications:     Pericardial effusion  History:        Patient has prior history of Echocardiogram examinations, most                 recent 06/30/2022.  Sonographer:    Merrie Roof RDCS Referring Phys: [phone] [facility]  Sonographer Comments: 600 ml of blood tinged fluid was drawn out with no complications. IMPRESSIONS  1. Left ventricular ejection fraction, by estimation, is 60 to 65%. The left ventricle has normal function.  2. Difficult to visualize bubble study, no significant interatrial shunt. Conclusion(s)/Recommendation(s): Pre pericardiocentesis shows large pericardial effusion. Post shows resolution of pericardial effusion. FINDINGS  Left Ventricle: Left ventricular ejection fraction, by estimation, is 60 to 65%. The left ventricle has normal function. Pericardium: The pericardial effusion is circumferential. IAS/Shunts: Agitated saline contrast was given intravenously to evaluate for intracardiac shunting. Phineas Inches Electronically signed by Phineas Inches Signature Date/Time: 07/01/2022/12:51:50 PM    Final    CARDIAC CATHETERIZATION  Result Date: 07/01/2022 Successful pericardiocentesis via the subxiphoid area.  I removed 600 mL of bloody fluid.  On echocardiogram, there was resolution of pericardial effusion.  I elected not to keep the pigtail catheter in given that I could not advance the the regular catheter provided with the pericardiocentesis kit due to what seems to be significantly fibrosed pericardium.  I ended up using a short 5 French pigtail catheter which I felt was not a good option to leave in place. Recommendations: Given that the effusion is bloody, differential diagnosis include malignancy or pericarditis with hemorrhagic transformation given that she is on anticoagulation.  I recommend holding Eliquis for at least few weeks. The fluid was sent to the lab for analysis.   ECHOCARDIOGRAM LIMITED  Result Date: 07/01/2022    ECHOCARDIOGRAM LIMITED REPORT   Patient Name:   DEBI COUSIN Date of  Exam: 07/01/2022 Medical Rec #:  [id number]      Height:       70.0 in Accession #:    [id number]     Weight:       200.8 lb Date of Birth:  1930-07-03       BSA:          2.091 m Patient Age:    52 years       BP:  158/97 mmHg Patient Gender: F              HR:           111 bpm. Exam Location:  Inpatient Procedure: Limited Echo, Limited Color Doppler and Cardiac Doppler Indications:    pericardial effusion  History:        Patient has prior history of Echocardiogram examinations, most                 recent 06/30/2022. Pericarditis, chronic kidney disease,                 Arrythmias:Atrial Fibrillation; Risk Factors:Hypertension and                 Dyslipidemia.  Sonographer:    Johny Chess RDCS Referring Phys: Cassie Freer [place] IMPRESSIONS  1. Left ventricular ejection fraction, by estimation, is 60 to 65%. The left ventricle has normal function.  2. Large pericardial effusion. The pericardial effusion is circumferential. Moderate pleural effusion.  3. The inferior vena cava is dilated in size with <50% respiratory variability, suggesting right atrial pressure of 15 mmHg. Conclusion(s)/Recommendation(s): Mild features of cardiac tamponade, recommend clinical correlation. No significant changes from prior echo. FINDINGS  Left Ventricle: Left ventricular ejection fraction, by estimation, is 60 to 65%. The left ventricle has normal function. Pericardium: A large pericardial effusion is present. The pericardial effusion is circumferential. Venous: The inferior vena cava is dilated in size with less than 50% respiratory variability, suggesting right atrial pressure of 15 mmHg. Additional Comments: There is a moderate pleural effusion. IVC IVC diam: 2.70 cm Phineas Inches Electronically signed by Phineas Inches Signature Date/Time: 07/01/2022/10:21:29 AM    Final    ECHOCARDIOGRAM LIMITED  Result Date: 06/30/2022    ECHOCARDIOGRAM LIMITED REPORT   Patient Name:   LESLEY ATKIN Date of Exam: 06/30/2022  Medical Rec #:  [id number]      Height:       70.0 in Accession #:    [id number]     Weight:       200.0 lb Date of Birth:  May 27, 1930       BSA:          2.087 m Patient Age:    57 years       BP:           114/81 mmHg Patient Gender: F              HR:           101 bpm. Exam Location:  Inpatient Procedure: Limited Echo                       STAT ECHO Reported to: Dr Loralie Champagne on 06/30/2022 12:00:00 AM          Dr. Lake Bells [place] bedside for echo. Indications:    Pericardial effusion  History:        Patient has prior history of Echocardiogram examinations, most                 recent 06/23/2022.  Sonographer:    Clayton Lefort RDCS (AE) Referring Phys: [phone] HA[facility] MENG IMPRESSIONS  1. Large pericardial effusion. The pericardial effusion is circumferential. The IVC is dilated though the RV does not appear to show diastolic collapse. No respirometry. I am concerned for early tamponade.  2. Left ventricular ejection fraction, by estimation, is 60 to 65%. The left ventricle has normal function. The left ventricle has  no regional wall motion abnormalities. Left ventricular diastolic parameters are indeterminate.  3. Right ventricular systolic function is normal. The right ventricular size is normal.  4. The aortic valve is tricuspid. There is severe calcifcation of the aortic valve. not fully evaluated.  5. The inferior vena cava is dilated in size with <50% respiratory variability, suggesting right atrial pressure of 15 mmHg.  6. The patient was in atrial fibrillation. FINDINGS  Left Ventricle: Left ventricular ejection fraction, by estimation, is 60 to 65%. The left ventricle has normal function. The left ventricle has no regional wall motion abnormalities. Left ventricular diastolic parameters are indeterminate. Right Ventricle: The right ventricular size is normal. No increase in right ventricular wall thickness. Right ventricular systolic function is normal. Pericardium: A large pericardial effusion is present. The  pericardial effusion is circumferential. Aortic Valve: The aortic valve is tricuspid. There is severe calcifcation of the aortic valve. Not fully evaluated. Venous: The inferior vena cava is dilated in size with less than 50% respiratory variability, suggesting right atrial pressure of 15 mmHg. IVC IVC diam: 3.00 cm Dalton McleanMD Electronically signed by Franki Monte Signature Date/Time: 06/30/2022/3:08:41 PM    Final    CT Abdomen Pelvis W Contrast  Result Date: 06/30/2022 CLINICAL DATA:  Nausea and emesis. Abdominal distension. Pallor. Abdominal pain. EXAM: CT ABDOMEN AND PELVIS WITH CONTRAST TECHNIQUE: Multidetector CT imaging of the abdomen and pelvis was performed using the standard protocol following bolus administration of intravenous contrast. RADIATION DOSE REDUCTION: This exam was performed according to the departmental dose-optimization program which includes automated exposure control, adjustment of the mA and/or kV according to patient size and/or use of iterative reconstruction technique. CONTRAST:  147m OMNIPAQUE IOHEXOL 350 MG/ML SOLN COMPARISON:  Multiple exams, including CT chest 10/16/2019 FINDINGS: Lower chest: Large pericardial effusion, not present on 10/16/2019. Pacer leads noted. Mild to moderate cardiomegaly. Aortic valve calcification. Thoracic aortic atherosclerotic calcification. Small to moderate hiatal hernia. Fluid distention of the distal esophagus. Small to moderate bilateral pleural effusions with passive atelectasis. Mild scarring or atelectasis in the right middle lobe and lingula. Hepatobiliary: Accentuated density in the gallbladder, probably from sludge. Mildly accentuated enhancement in the wall of the common hepatic duct and common bile duct. No intrahepatic biliary dilatation. Indistinct stranding in the porta hepatis. Pancreas: Hypodense pancreatic tail lesion, 2.4 by 1.6 cm on image 36 series 3, internal density 9 Hounsfield units. Fluid density lesion along the  inferior margin of the pancreatic body, 3.0 by 1.6 cm on image 39 series 3, internal density 2 Hounsfield units. Questionable prominence of the dorsal and ventral pancreatic duct in the pancreatic head. Possible uncinate process cystic lesion, 1.6 by 0.8 cm on image 40 series 3. Spleen: Unremarkable Adrenals/Urinary Tract: Multiple bilateral moderate to large sized benign renal cysts. 1.8 by 1.4 cm focus of hypoenhancement in the renal parenchyma adjacent to the posterior renal cyst of the left kidney upper pole on image 14 series 8 and image 39 series 3. Urinary bladder unremarkable. The adrenal glands appear normal. Stomach/Bowel: Sigmoid colon diverticulosis, no compelling findings of active diverticulitis. Vascular/Lymphatic: Atherosclerosis is present, including aortoiliac atherosclerotic disease. There is atheromatous plaque proximally in the celiac trunk and SMA without overt occlusion. Reproductive: Unremarkable Other: Low-level edema along the root of the mesentery. Infiltrative edema in the porta hepatis. Musculoskeletal: Prominent degenerative hip arthropathy bilaterally. Bony demineralization. Left sacral nerve stimulator device. Old healed right medial eleventh rib fracture. Dextroconvex lumbar scoliosis. Mild rectus diastasis and anterior abdominal wall laxity. Lumbar spondylosis and  degenerative disc disease contribute to multilevel foraminal stenosis and central narrowing of the thecal sac IMPRESSION: 1. Very large pericardial effusion, not present on 10/16/2019. Correlate with physical exam findings such as Becks triad in assessing for signs of tamponade, echocardiography may be warranted in further workup. 2. Inflammatory stranding in the porta hepatis some accentuated enhancement along the non dilated extrahepatic biliary tree. Cannot exclude cholangitis. 3. Focal hypoenhancement in the upper medial right kidney renal parenchyma, potentially from pyelonephritis or infiltrative malignancy.  Correlate with urine analysis in assessing for signs of pyelonephritis. 4. Multiple hypodense, probably cystic lesions of the pancreas. The largest measures 3.0 cm in long axis. In light of the size the lesions and the patient's age, follow up pancreatic protocol CT or MRI is recommended in 2 years time. The patient does have a pacer and a sacral nerve stimulator which may preclude MRI. This recommendation follows ACR consensus guidelines: Management of Incidental Pancreatic Cysts: A White Paper of the ACR Incidental Findings Committee. [place] 2202;[id number]. 5. Small to moderate bilateral pleural effusions with passive atelectasis. 6. Other imaging findings of potential clinical significance: Aortic valve calcification. Aortic Atherosclerosis (ICD10-I70.0). Small to moderate hiatal hernia. Multiple bilateral moderate to large sized renal cysts. Sigmoid colon diverticulosis. Systemic atherosclerosis. Prominent degenerative hip arthropathy bilaterally. Dextroconvex lumbar scoliosis. Mild rectus diastasis. Bony demineralization. Multilevel lumbar impingement due to spondylosis and degenerative disc disease. Electronically Signed   By: Van Clines M.D.   On: 06/30/2022 13:07   DG Abd Acute W/Chest  Result Date: 06/30/2022 CLINICAL DATA:  Emesis EXAM: DG ABDOMEN ACUTE WITH 1 VIEW CHEST COMPARISON:  Radiographs 06/22/2022 FINDINGS: Mild gaseous distention of the cecum/right colon at the upper limits of normal. No dilation of the remaining colon or small. No radiopaque calculi or other significant radiographic abnormality is seen. Heart size and mediastinal contours are within normal limits. Sacral stimulator. Enlarged cardiomediastinal silhouette. Atelectasis/consolidation in the left lower lung. Scattered areas of linear atelectasis/scarring. Possible small left pleural effusion. Aortic atherosclerotic calcification. Advanced thoracolumbar spondylosis. IMPRESSION: 1. Negative abdominal radiographs.  2. Presumed atelectasis in the left lower lung though pneumonia is difficult to exclude. Electronically Signed   By: Placido Sou M.D.   On: 06/30/2022 11:25   ECHOCARDIOGRAM COMPLETE  Result Date: 06/23/2022    ECHOCARDIOGRAM REPORT   Patient Name:   DONYALE FALCON Date of Exam: 06/23/2022 Medical Rec #:  [id number]      Height:       68.0 in Accession #:    [id number]     Weight:       196.2 lb Date of Birth:  [birth year]       BSA:          2.027 m Patient Age:    68 years       BP:           111/70 mmHg Patient Gender: F              HR:           81 bpm. Exam Location:  Inpatient Procedure: 2D Echo, Cardiac Doppler and Color Doppler Indications:    Acute respiratory distress  History:        Patient has prior history of Echocardiogram examinations, most                 recent 01/13/2022. Pacemaker, Arrythmias:Atrial Fibrillation; Risk                 Factors:Hypertension.  Sonographer:    Joette Catching RCS Referring Phys: [place]  1. Moderate hypertrophy of the basal septum with otherwise mild LVH. Left ventricular ejection fraction, by estimation, is 60 to 65%. The left ventricle has normal function. The left ventricle has no regional wall motion abnormalities. There is moderate  asymmetric left ventricular hypertrophy. Left ventricular diastolic parameters are indeterminate. Elevated left ventricular end-diastolic pressure.  2. Right ventricular systolic function is normal. The right ventricular size is normal. There is mildly elevated pulmonary artery systolic pressure.  3. Left atrial size was severely dilated.  4. A small pericardial effusion is present. The pericardial effusion is posterior and lateral to the left ventricle. There is no evidence of cardiac tamponade. Moderate pleural effusion in the left lateral region.  5. The mitral valve is normal in structure. Trivial mitral valve regurgitation. No evidence of mitral stenosis.  6. The aortic valve is tricuspid. There is  moderate calcification of the aortic valve. There is moderate thickening of the aortic valve. Aortic valve regurgitation is mild. Moderate aortic valve stenosis. Aortic valve area, by VTI measures 1.95 cm. Aortic valve mean gradient measures 22.0 mmHg. Aortic valve Vmax measures 2.93 m/s.  7. Aortic dilatation noted. There is mild dilatation of the aortic root, measuring 37 mm. There is moderate dilatation of the ascending aorta, measuring 46 mm.  8. The inferior vena cava is dilated in size with >50% respiratory variability, suggesting right atrial pressure of 8 mmHg. FINDINGS  Left Ventricle: Moderate hypertrophy of the basal septum with otherwise mild LVH. Left ventricular ejection fraction, by estimation, is 60 to 65%. The left ventricle has normal function. The left ventricle has no regional wall motion abnormalities. The left ventricular internal cavity size was normal in size. There is moderate asymmetric left ventricular hypertrophy. Left ventricular diastolic function could not be evaluated due to atrial fibrillation. Left ventricular diastolic parameters are indeterminate. Elevated left ventricular end-diastolic pressure. Right Ventricle: The right ventricular size is normal. No increase in right ventricular wall thickness. Right ventricular systolic function is normal. There is mildly elevated pulmonary artery systolic pressure. The tricuspid regurgitant velocity is 2.98  m/s, and with an assumed right atrial pressure of 8 mmHg, the estimated right ventricular systolic pressure is 76.2 mmHg. Left Atrium: Left atrial size was severely dilated. Right Atrium: Right atrial size was normal in size. Pericardium: A small pericardial effusion is present. The pericardial effusion is posterior and lateral to the left ventricle. There is no evidence of cardiac tamponade. Mitral Valve: The mitral valve is normal in structure. Trivial mitral valve regurgitation. No evidence of mitral valve stenosis. Tricuspid Valve:  The tricuspid valve is normal in structure. Tricuspid valve regurgitation is mild . No evidence of tricuspid stenosis. Aortic Valve: The aortic valve is tricuspid. There is moderate calcification of the aortic valve. There is moderate thickening of the aortic valve. Aortic valve regurgitation is mild. Aortic regurgitation PHT measures 705 msec. Moderate aortic stenosis is present. Aortic valve mean gradient measures 22.0 mmHg. Aortic valve peak gradient measures 34.3 mmHg. Aortic valve area, by VTI measures 1.95 cm. Pulmonic Valve: The pulmonic valve was normal in structure. Pulmonic valve regurgitation is trivial. No evidence of pulmonic stenosis. Aorta: Aortic dilatation noted. There is mild dilatation of the aortic root, measuring 37 mm. There is moderate dilatation of the ascending aorta, measuring 46 mm. Venous: The inferior vena cava is dilated in size with greater than 50% respiratory variability, suggesting right atrial pressure of 8 mmHg. IAS/Shunts: No  atrial level shunt detected by color flow Doppler. Additional Comments: There is a moderate pleural effusion in the left lateral region.  LEFT VENTRICLE PLAX 2D LVIDd:         3.95 cm   Diastology LVIDs:         2.25 cm   LV e' medial:    5.15 cm/s LV PW:         1.15 cm   LV E/e' medial:  24.3 LV IVS:        1.35 cm   LV e' lateral:   10.30 cm/s LVOT diam:     2.30 cm   LV E/e' lateral: 12.1 LV SV:         107 LV SV Index:   53 LVOT Area:     4.15 cm  RIGHT VENTRICLE            IVC RV Basal diam:  4.10 cm    IVC diam: 2.90 cm RV Mid diam:    2.30 cm RV S prime:     8.93 cm/s TAPSE (M-mode): 1.3 cm LEFT ATRIUM             Index        RIGHT ATRIUM           Index LA diam:        4.00 cm 1.97 cm/m   RA Area:     21.00 cm LA Vol (A2C):   74.1 ml 36.55 ml/m  RA Volume:   49.80 ml  24.57 ml/m LA Vol (A4C):   81.4 ml 40.15 ml/m LA Biplane Vol: 80.9 ml 39.91 ml/m  AORTIC VALVE AV Area (Vmax):    1.74 cm AV Area (Vmean):   1.83 cm AV Area (VTI):     1.95  cm AV Vmax:           293.00 cm/s AV Vmean:          205.500 cm/s AV VTI:            0.548 m AV Peak Grad:      34.3 mmHg AV Mean Grad:      22.0 mmHg LVOT Vmax:         122.50 cm/s LVOT Vmean:        90.300 cm/s LVOT VTI:          0.258 m LVOT/AV VTI ratio: 0.47 AI PHT:            705 msec  AORTA Ao Root diam: 3.65 cm Ao Asc diam:  4.60 cm MITRAL VALVE                TRICUSPID VALVE MV Area (PHT): 3.76 cm     TV Peak grad:   30.2 mmHg MV Decel Time: 202 msec     TV Vmax:        2.75 m/s MV E velocity: 125.00 cm/s  TR Peak grad:   35.5 mmHg MV A velocity: 31.50 cm/s   TR Vmax:        298.00 cm/s MV E/A ratio:  3.97                             SHUNTS                             Systemic VTI:  0.26 m  Systemic Diam: 2.30 cm Skeet Latch MD Electronically signed by Skeet Latch MD Signature Date/Time: 06/23/2022/4:05:09 PM    Final    DG Chest Portable 1 View  Result Date: 06/22/2022 CLINICAL DATA:  Chest pain. EXAM: PORTABLE CHEST 1 VIEW COMPARISON:  12/08/2018. FINDINGS: Heart is enlarged and the mediastinal contour is stable. Atherosclerotic calcification of the aorta is noted. A dual lead pacemaker is present over the left chest. Increased atelectasis or infiltrate is noted at the lung bases, greater on the left than on the right. There is a small left pleural effusion. No pneumothorax. No acute osseous abnormality. IMPRESSION: 1. Increased atelectasis or infiltrate at the lung bases, greater on the left than on the right. 2. Small left pleural effusion. Electronically Signed   By: Brett Fairy M.D.   On: 06/22/2022 04:50    Assessment/Plan Leukocytosis wbc 13.8, afebrile, 2/2 Prednisone? Vs active infection? Repeat CBC/diff one week.   Pericarditis Hospitalized 06/30/22-07/04/22 for pericardial effusion, taking Prednisone, Colchicine, negative for malignant cells, likelihood viral complicated with hemorrhage from chronic Eliquis   Atrial fibrillation (HCC) Heart  rate is in control, taking Amiodarone, Atenolol, off Eliquis  Hyponatremia Na 132 07/03/22  Recurrent depression (HCC)  on Fluoxetine  Acute on chronic diastolic CHF (congestive heart failure) (HCC) CHF/Edema BLE, not apparent,  taking Furosemide(reduce from 6x/wk to 5x/wk due to GI symptoms), BNP 577 12/08/21<<793.1 06/22/22,  01/13/22 echo EF 60-65%, f/u BMP one wk  Hypothyroidism taking Levothyroxine decreased to 63mg 2/2 TSH 0.23 03/31/22  Hypertension Blood pressure is controlled, on Losartan, Atenolol  CKD (chronic kidney disease) stage 3, GFR 30-59 ml/min (HCC) Bun/cret 24/1.32 07/03/22  Anemia Improving, Hgb 10.9 07/12/22, check Iron, Ferritin, Vit B12, Folate.   Chronic GERD  takes Omeprazole, adding Zofran prn for nausea, vomiting.   Hyperlipidemia off Rosuvastatin to reduce GI symptoms, her goal of care is comfort measures.   Osteoarthritis, multiple sites off Tramadol, Morphine is available to her for comfort measures.   Slow transit constipation stable, taking MiraLax daily.     Family/ staff Communication: plan of care reviewed with the patient, the patient's daughter, and charge nurse.   Labs/tests ordered:  CBC/diff, BMP, Iron Ferritin, Vit B12, Folate one week  Time spend 35 minutes.

## 2022-07-13 NOTE — Assessment & Plan Note (Signed)
taking Levothyroxine decreased to 14mg 2/2 TSH 0.23 03/31/22

## 2022-07-13 NOTE — Assessment & Plan Note (Signed)
Improving, Hgb 10.9 07/12/22, check Iron, Ferritin, Vit B12, Folate.

## 2022-07-13 NOTE — Assessment & Plan Note (Signed)
off Rosuvastatin to reduce GI symptoms, her goal of care is comfort measures.

## 2022-07-13 NOTE — Assessment & Plan Note (Signed)
Hospitalized 06/30/22-07/04/22 for pericardial effusion, taking Prednisone, Colchicine, negative for malignant cells, likelihood viral complicated with hemorrhage from chronic Eliquis

## 2022-07-13 NOTE — Assessment & Plan Note (Signed)
off Tramadol, Morphine is available to her for comfort measures.

## 2022-07-13 NOTE — Assessment & Plan Note (Signed)
Bun/cret 24/1.32 07/03/22

## 2022-07-14 ENCOUNTER — Telehealth (HOSPITAL_COMMUNITY): Payer: Self-pay | Admitting: Cardiovascular Disease

## 2022-07-14 ENCOUNTER — Encounter: Payer: Self-pay | Admitting: Nurse Practitioner

## 2022-07-14 NOTE — Telephone Encounter (Signed)
Patients daughter called and cancelled patients echocardiogram due to she is now under [facility] care. Order will be removed from the active echo WQ.

## 2022-07-18 ENCOUNTER — Ambulatory Visit (HOSPITAL_COMMUNITY): Payer: Medicare Other

## 2022-07-20 ENCOUNTER — Other Ambulatory Visit: Payer: Self-pay | Admitting: Orthopedic Surgery

## 2022-07-20 DIAGNOSIS — Z515 Encounter for palliative care: Secondary | ICD-10-CM

## 2022-07-20 MED ORDER — LORAZEPAM 1 MG PO TABS: 2.0000 mg | ORAL_TABLET | Freq: Four times a day (QID) | ORAL | 0 refills | Status: AC | PRN

## 2022-07-29 ENCOUNTER — Ambulatory Visit: Payer: Medicare Other | Admitting: Nurse Practitioner

## 2022-08-01 ENCOUNTER — Ambulatory Visit: Payer: Medicare Other | Admitting: Neurology

## 2022-08-05 DEATH — deceased
